# Patient Record
Sex: Male | Born: 1960 | Race: Black or African American | Hispanic: No | Marital: Single | Smoking: Former smoker
Health system: Southern US, Community
[De-identification: ages and names within clinical notes are randomized; demographics above are authoritative.]

## PROBLEM LIST (undated history)

## (undated) DIAGNOSIS — E559 Vitamin D deficiency, unspecified: Secondary | ICD-10-CM

## (undated) DIAGNOSIS — E611 Iron deficiency: Secondary | ICD-10-CM

## (undated) DIAGNOSIS — C801 Malignant (primary) neoplasm, unspecified: Secondary | ICD-10-CM

## (undated) DIAGNOSIS — K219 Gastro-esophageal reflux disease without esophagitis: Secondary | ICD-10-CM

## (undated) HISTORY — PX: KNEE SURGERY: SHX244

---

## 2019-04-27 ENCOUNTER — Emergency Department (HOSPITAL_COMMUNITY): Payer: Medicaid Other

## 2019-04-27 ENCOUNTER — Encounter (HOSPITAL_COMMUNITY): Payer: Self-pay | Admitting: Emergency Medicine

## 2019-04-27 ENCOUNTER — Inpatient Hospital Stay (HOSPITAL_COMMUNITY): Payer: Medicaid Other

## 2019-04-27 ENCOUNTER — Other Ambulatory Visit: Payer: Self-pay

## 2019-04-27 ENCOUNTER — Inpatient Hospital Stay (HOSPITAL_COMMUNITY)
Admission: EM | Admit: 2019-04-27 | Discharge: 2019-05-04 | DRG: 166 | Disposition: A | Discharge status: Home Health | Payer: Medicaid Other | Attending: Internal Medicine | Admitting: Internal Medicine

## 2019-04-27 DIAGNOSIS — E559 Vitamin D deficiency, unspecified: Secondary | ICD-10-CM | POA: Diagnosis present

## 2019-04-27 DIAGNOSIS — T380X5A Adverse effect of glucocorticoids and synthetic analogues, initial encounter: Secondary | ICD-10-CM | POA: Diagnosis not present

## 2019-04-27 DIAGNOSIS — D649 Anemia, unspecified: Secondary | ICD-10-CM | POA: Diagnosis present

## 2019-04-27 DIAGNOSIS — D72829 Elevated white blood cell count, unspecified: Secondary | ICD-10-CM | POA: Diagnosis not present

## 2019-04-27 DIAGNOSIS — F1010 Alcohol abuse, uncomplicated: Secondary | ICD-10-CM | POA: Diagnosis present

## 2019-04-27 DIAGNOSIS — G936 Cerebral edema: Secondary | ICD-10-CM | POA: Diagnosis present

## 2019-04-27 DIAGNOSIS — C3411 Malignant neoplasm of upper lobe, right bronchus or lung: Secondary | ICD-10-CM | POA: Diagnosis not present

## 2019-04-27 DIAGNOSIS — R739 Hyperglycemia, unspecified: Secondary | ICD-10-CM | POA: Diagnosis not present

## 2019-04-27 DIAGNOSIS — F17210 Nicotine dependence, cigarettes, uncomplicated: Secondary | ICD-10-CM | POA: Diagnosis present

## 2019-04-27 DIAGNOSIS — E876 Hypokalemia: Secondary | ICD-10-CM

## 2019-04-27 DIAGNOSIS — Z20828 Contact with and (suspected) exposure to other viral communicable diseases: Secondary | ICD-10-CM | POA: Diagnosis present

## 2019-04-27 DIAGNOSIS — R29701 NIHSS score 1: Secondary | ICD-10-CM | POA: Diagnosis present

## 2019-04-27 DIAGNOSIS — R569 Unspecified convulsions: Secondary | ICD-10-CM | POA: Diagnosis present

## 2019-04-27 DIAGNOSIS — E611 Iron deficiency: Secondary | ICD-10-CM | POA: Diagnosis present

## 2019-04-27 DIAGNOSIS — C7931 Secondary malignant neoplasm of brain: Secondary | ICD-10-CM | POA: Diagnosis present

## 2019-04-27 DIAGNOSIS — Z791 Long term (current) use of non-steroidal anti-inflammatories (NSAID): Secondary | ICD-10-CM | POA: Diagnosis not present

## 2019-04-27 DIAGNOSIS — Z9889 Other specified postprocedural states: Secondary | ICD-10-CM

## 2019-04-27 DIAGNOSIS — I10 Essential (primary) hypertension: Secondary | ICD-10-CM | POA: Diagnosis present

## 2019-04-27 DIAGNOSIS — R531 Weakness: Secondary | ICD-10-CM

## 2019-04-27 DIAGNOSIS — C7951 Secondary malignant neoplasm of bone: Secondary | ICD-10-CM | POA: Diagnosis present

## 2019-04-27 HISTORY — DX: Vitamin D deficiency, unspecified: E55.9

## 2019-04-27 HISTORY — DX: Iron deficiency: E61.1

## 2019-04-27 LAB — URINALYSIS, ROUTINE W REFLEX MICROSCOPIC
Bilirubin Urine: NEGATIVE
Glucose, UA: NEGATIVE mg/dL
Hgb urine dipstick: NEGATIVE
Ketones, ur: NEGATIVE mg/dL
Leukocytes,Ua: NEGATIVE
Nitrite: NEGATIVE
Protein, ur: NEGATIVE mg/dL
Specific Gravity, Urine: 1.005 (ref 1.005–1.030)
pH: 6 (ref 5.0–8.0)

## 2019-04-27 LAB — DIFFERENTIAL
Abs Immature Granulocytes: 0.02 10*3/uL (ref 0.00–0.07)
Basophils Absolute: 0 10*3/uL (ref 0.0–0.1)
Basophils Relative: 0 %
Eosinophils Absolute: 0.3 10*3/uL (ref 0.0–0.5)
Eosinophils Relative: 3 %
Immature Granulocytes: 0 %
Lymphocytes Relative: 31 %
Lymphs Abs: 3 10*3/uL (ref 0.7–4.0)
Monocytes Absolute: 0.8 10*3/uL (ref 0.1–1.0)
Monocytes Relative: 8 %
Neutro Abs: 5.7 10*3/uL (ref 1.7–7.7)
Neutrophils Relative %: 58 %

## 2019-04-27 LAB — RAPID URINE DRUG SCREEN, HOSP PERFORMED
Amphetamines: NOT DETECTED
Barbiturates: NOT DETECTED
Benzodiazepines: NOT DETECTED
Cocaine: NOT DETECTED
Opiates: NOT DETECTED
Tetrahydrocannabinol: NOT DETECTED

## 2019-04-27 LAB — COMPREHENSIVE METABOLIC PANEL
ALT: 14 U/L (ref 0–44)
AST: 20 U/L (ref 15–41)
Albumin: 3.2 g/dL — ABNORMAL LOW (ref 3.5–5.0)
Alkaline Phosphatase: 100 U/L (ref 38–126)
Anion gap: 9 (ref 5–15)
BUN: 14 mg/dL (ref 6–20)
CO2: 24 mmol/L (ref 22–32)
Calcium: 9.4 mg/dL (ref 8.9–10.3)
Chloride: 102 mmol/L (ref 98–111)
Creatinine, Ser: 0.77 mg/dL (ref 0.61–1.24)
GFR calc Af Amer: >60 mL/min (ref >60)
GFR calc non Af Amer: >60 mL/min (ref >60)
Glucose, Bld: 153 mg/dL — ABNORMAL HIGH (ref 70–99)
Potassium: 3.4 mmol/L — ABNORMAL LOW (ref 3.5–5.1)
Sodium: 135 mmol/L (ref 135–145)
Total Bilirubin: 0.6 mg/dL (ref 0.3–1.2)
Total Protein: 8.4 g/dL — ABNORMAL HIGH (ref 6.5–8.1)

## 2019-04-27 LAB — CBC
HCT: 35.6 % — ABNORMAL LOW (ref 39.0–52.0)
Hemoglobin: 11.3 g/dL — ABNORMAL LOW (ref 13.0–17.0)
MCH: 29.3 pg (ref 26.0–34.0)
MCHC: 31.7 g/dL (ref 30.0–36.0)
MCV: 92.2 fL (ref 80.0–100.0)
Platelets: 428 10*3/uL — ABNORMAL HIGH (ref 150–400)
RBC: 3.86 MIL/uL — ABNORMAL LOW (ref 4.22–5.81)
RDW: 12.7 % (ref 11.5–15.5)
WBC: 9.9 10*3/uL (ref 4.0–10.5)
nRBC: 0 % (ref 0.0–0.2)

## 2019-04-27 LAB — PROTIME-INR
INR: 1.2 (ref 0.8–1.2)
Prothrombin Time: 15.5 seconds — ABNORMAL HIGH (ref 11.4–15.2)

## 2019-04-27 LAB — APTT: aPTT: 34 seconds (ref 24–36)

## 2019-04-27 LAB — ETHANOL: Alcohol, Ethyl (B): <10 mg/dL (ref <10)

## 2019-04-27 MED ORDER — SODIUM CHLORIDE 0.9% FLUSH
3.0000 mL | Freq: Two times a day (BID) | INTRAVENOUS | Status: DC
  Administered 2019-04-28 – 2019-05-04 (×11): 3 mL via INTRAVENOUS

## 2019-04-27 MED ORDER — IOHEXOL 300 MG/ML  SOLN: 100.0000 mL | Freq: Once | INTRAMUSCULAR | Status: DC | PRN

## 2019-04-27 MED ORDER — ACETAMINOPHEN 650 MG RE SUPP: 650.0000 mg | Freq: Four times a day (QID) | RECTAL | Status: DC | PRN

## 2019-04-27 MED ORDER — SODIUM CHLORIDE 0.9% FLUSH
3.0000 mL | INTRAVENOUS | Status: DC | PRN
  Administered 2019-04-27: 3 mL via INTRAVENOUS
  Filled 2019-04-27: qty 3

## 2019-04-27 MED ORDER — SODIUM CHLORIDE 0.9 % IV SOLN
250.0000 mL | INTRAVENOUS | Status: DC | PRN
  Administered 2019-04-28 – 2019-04-29 (×3): 250 mL via INTRAVENOUS

## 2019-04-27 MED ORDER — DEXAMETHASONE SODIUM PHOSPHATE 10 MG/ML IJ SOLN
10.0000 mg | Freq: Once | INTRAMUSCULAR | Status: AC
  Administered 2019-04-27: 21:00:00 10 mg via INTRAVENOUS
  Filled 2019-04-27: qty 1

## 2019-04-27 MED ORDER — ACETAMINOPHEN 325 MG PO TABS
650.0000 mg | ORAL_TABLET | Freq: Four times a day (QID) | ORAL | Status: DC | PRN
  Administered 2019-04-30 – 2019-05-03 (×5): 650 mg via ORAL
  Filled 2019-04-27 (×5): qty 2

## 2019-04-27 MED ORDER — LEVETIRACETAM IN NACL 1000 MG/100ML IV SOLN
1000.0000 mg | Freq: Once | INTRAVENOUS | Status: AC
  Administered 2019-04-27: 21:00:00 1000 mg via INTRAVENOUS
  Filled 2019-04-27: qty 100

## 2019-04-27 NOTE — ED Notes (Signed)
PT back to room at this time from CT.

## 2019-04-27 NOTE — ED Notes (Signed)
Code stroke beeped out at [place]

## 2019-04-27 NOTE — H&P (Signed)
TRH H&P    Patient Demographics:    Brandon Ferguson, is a 58 y.o. male  MRN: [id number]  DOB - Mar 23, 1961  Admit Date - 04/27/2019  Referring MD/NP/PA: Ezequiel Essex  Outpatient Primary MD for the patient is Patient, No Pcp Per  Patient coming from:  home  Chief complaint- right sided weakness   HPI:    Brandon Ferguson  is a 58 y.o. male, w hx of iron deficiency?, vitamin D def, smoker, apparently presents with right sided weakness that started about 1800 while loading some wood. Pt states that about 1 week ago had left arm weakness.  Pt notes headache for the past week.  Pt denies vision change, numbness, tingling, change in bowel movement, brbpr, hematuria, incontinence.  Pt notes mid to lower back pain over the past few weeks as well as slight weight  loss.     In ED,  T 99.5, P 95  R 22, Bp 145/90  Pox 96% on RA  CT brain  IMPRESSION: 1. 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 Brain MRI without and with contrast is recommended for further evaluation. 2. No midline shift or intracranial hemorrhage.  Urinalysis negative ETOH <10 INR 1.2 Wbc 9.9, Hgb 11.3, Plt 428 Na 135, K 3.4,  Bun 14, Creatinine 0.77,  Ast 20, Alt 14 Alb 3.2  Teleneurology consulted recommended neurosurgery consult  Neurosurgery consulted by ED, recommended MRI brain, and dexamethasone and keppra per ED.    Pt will be admitted to [facility] for metastatic brain disease of unknown primary    Review of systems:    In addition to the HPI above,  No Fever-chills, No Headache, No changes with Vision or hearing, No problems swallowing food or Liquids, No Chest pain, Cough or Shortness of Breath, No Abdominal pain, No Nausea or Vomiting, bowel movements are regular, No Blood in stool or Urine, No dysuria, No new skin rashes or bruises, No new joints pains-aches,  No  new weakness, tingling, numbness in any extremity,   No polyuria, polydypsia or polyphagia, No significant Mental Stressors.  All other systems reviewed and are negative.    Past History of the following :    Past Medical History:  Diagnosis Date   Iron deficiency    Vitamin D deficiency       Past Surgical History:  Procedure Laterality Date   KNEE SURGERY        Social History:      Social History   Tobacco Use   Smoking status: Current Every Day Smoker    Packs/day: 1.00    Types: Cigarettes   Smokeless tobacco: Never Used  Substance Use Topics   Alcohol use: Yes    Alcohol/week: 6.0 standard drinks    Types: 6 Cans of beer per week    Comment: pt drinks 3 large beers daily       Family History :     Family History  Problem Relation Age of Onset   Breast cancer Mother  Cirrhosis Father    Alcohol abuse Father        Home Medications:   Prior to Admission medications   Medication Sig Start Date End Date Taking? Authorizing Provider  meloxicam (MOBIC) 15 MG tablet Take 15 mg by mouth daily. 04/25/19   [provider]     Allergies:    No Known Allergies   Physical Exam:   Vitals  Blood pressure (!) 142/84, pulse 69, temperature 99.5 F (37.5 C), temperature source Oral, resp. rate 19, height 6' (1.829 m), weight 74.8 kg, SpO2 95 %.  1.  General: axoxo3  2. Psychiatric: euthymic  3. Neurologic: Cn 2-12 intact, reflexes 2+ symmetric, diffuse with no clonus, motor 5/5 in all 4 ext  4. HEENMT:  Anicteric, + arcus senilis, pupils 1.21mm symmetric, direct, consensual,  Near intact Neck: no jvd  5. Respiratory : CTAB  6. Cardiovascular : rrr s1, s2, no m/g/r  7. Gastrointestinal:  Abd: soft, nt, nd, +bs  8. Skin:  Ext: no c/c/e, no rash  9.Musculoskeletal:  Good ROM,  No adenopathy    Data Review:    CBC Recent Labs  Lab 04/27/19 1835  WBC 9.9  HGB 11.3*  HCT 35.6*  PLT 428*  MCV 92.2  MCH 29.3   MCHC 31.7  RDW 12.7  LYMPHSABS 3.0  MONOABS 0.8  EOSABS 0.3  BASOSABS 0.0   ------------------------------------------------------------------------------------------------------------------  Results for orders placed or performed during the hospital encounter of 04/27/19 (from the past 48 hour(s))  Urine rapid drug screen (hosp performed)     Status: None   Collection Time: 04/27/19  6:25 PM  Result Value Ref Range   Opiates NONE DETECTED NONE DETECTED   Cocaine NONE DETECTED NONE DETECTED   Benzodiazepines NONE DETECTED NONE DETECTED   Amphetamines NONE DETECTED NONE DETECTED   Tetrahydrocannabinol NONE DETECTED NONE DETECTED   Barbiturates NONE DETECTED NONE DETECTED    Comment: (NOTE) DRUG SCREEN FOR MEDICAL PURPOSES ONLY.  IF CONFIRMATION IS NEEDED FOR ANY PURPOSE, NOTIFY LAB WITHIN 5 DAYS. LOWEST DETECTABLE LIMITS FOR URINE DRUG SCREEN Drug Class                     Cutoff (ng/mL) Amphetamine and metabolites    1000 Barbiturate and metabolites    200 Benzodiazepine                 277 Tricyclics and metabolites     300 Opiates and metabolites        300 Cocaine and metabolites        300 THC                            50 Performed at [facility], [address] [place], [place] [address]   Urinalysis, Routine w reflex microscopic     Status: Abnormal   Collection Time: 04/27/19  6:25 PM  Result Value Ref Range   Color, Urine STRAW (A) YELLOW   APPearance CLEAR CLEAR   Specific Gravity, Urine 1.005 1.005 - 1.030   pH 6.0 5.0 - 8.0   Glucose, UA NEGATIVE NEGATIVE mg/dL   Hgb urine dipstick NEGATIVE NEGATIVE   Bilirubin Urine NEGATIVE NEGATIVE   Ketones, ur NEGATIVE NEGATIVE mg/dL   Protein, ur NEGATIVE NEGATIVE mg/dL   Nitrite NEGATIVE NEGATIVE   Leukocytes,Ua NEGATIVE NEGATIVE    Comment: Performed at [facility], [address] [place],  [address]  Ethanol  Status: None   Collection Time: 04/27/19  6:35 PM  Result Value Ref Range   Alcohol,  Ethyl (B) <10 <10 mg/dL    Comment: (NOTE) Lowest detectable limit for serum alcohol is 10 mg/dL. For medical purposes only. Performed at [facility], [address], [place], [place] [address]   Protime-INR     Status: Abnormal   Collection Time: 04/27/19  6:35 PM  Result Value Ref Range   Prothrombin Time 15.5 (H) 11.4 - 15.2 seconds   INR 1.2 0.8 - 1.2    Comment: (NOTE) INR goal varies based on device and disease states. Performed at [facility], [address] [place], [place] [address]   APTT     Status: None   Collection Time: 04/27/19  6:35 PM  Result Value Ref Range   aPTT 34 24 - 36 seconds    Comment: Performed at [facility], [address], [place], [place] [address]  CBC     Status: Abnormal   Collection Time: 04/27/19  6:35 PM  Result Value Ref Range   WBC 9.9 4.0 - 10.5 K/uL   RBC 3.86 (L) 4.22 - 5.81 MIL/uL   Hemoglobin 11.3 (L) 13.0 - 17.0 g/dL   HCT 35.6 (L) 39.0 - 52.0 %   MCV 92.2 80.0 - 100.0 fL   MCH 29.3 26.0 - 34.0 pg   MCHC 31.7 30.0 - 36.0 g/dL   RDW 12.7 11.5 - 15.5 %   Platelets 428 (H) 150 - 400 K/uL   nRBC 0.0 0.0 - 0.2 %    Comment: Performed at [facility], [address], [place], [place] [address]  Differential     Status: None   Collection Time: 04/27/19  6:35 PM  Result Value Ref Range   Neutrophils Relative % 58 %   Neutro Abs 5.7 1.7 - 7.7 K/uL   Lymphocytes Relative 31 %   Lymphs Abs 3.0 0.7 - 4.0 K/uL   Monocytes Relative 8 %   Monocytes Absolute 0.8 0.1 - 1.0 K/uL   Eosinophils Relative 3 %   Eosinophils Absolute 0.3 0.0 - 0.5 K/uL   Basophils Relative 0 %   Basophils Absolute 0.0 0.0 - 0.1 K/uL   Immature Granulocytes 0 %   Abs Immature Granulocytes 0.02 0.00 - 0.07 K/uL    Comment: Performed at [facility], [address], [place], [place] [address]  Comprehensive metabolic panel     Status: Abnormal   Collection Time: 04/27/19  6:35 PM  Result Value Ref Range   Sodium 135 135 - 145 mmol/L   Potassium 3.4 (L) 3.5 - 5.1  mmol/L   Chloride 102 98 - 111 mmol/L   CO2 24 22 - 32 mmol/L   Glucose, Bld 153 (H) 70 - 99 mg/dL   BUN 14 6 - 20 mg/dL   Creatinine, Ser 0.77 0.61 - 1.24 mg/dL   Calcium 9.4 8.9 - 10.3 mg/dL   Total Protein 8.4 (H) 6.5 - 8.1 g/dL   Albumin 3.2 (L) 3.5 - 5.0 g/dL   AST 20 15 - 41 U/L   ALT 14 0 - 44 U/L   Alkaline Phosphatase 100 38 - 126 U/L   Total Bilirubin 0.6 0.3 - 1.2 mg/dL   GFR calc non Af Amer >60 >60 mL/min   GFR calc Af Amer >60 >60 mL/min   Anion gap 9 5 - 15    Comment: Performed at [facility], [address], [place],  [address]  Chemistries  Recent Labs  Lab 04/27/19 1835  NA 135  K 3.4*  CL 102  CO2 24  GLUCOSE 153*  BUN 14  CREATININE 0.77  CALCIUM 9.4  AST 20  ALT 14  ALKPHOS 100  BILITOT 0.6   ------------------------------------------------------------------------------------------------------------------  ------------------------------------------------------------------------------------------------------------------ GFR: Estimated Creatinine Clearance: 105.2 mL/min (by C-G formula based on SCr of 0.77 mg/dL). Liver Function Tests: Recent Labs  Lab 04/27/19 1835  AST 20  ALT 14  ALKPHOS 100  BILITOT 0.6  PROT 8.4*  ALBUMIN 3.2*   No results for input(s): LIPASE, AMYLASE in the last 168 hours. No results for input(s): AMMONIA in the last 168 hours. Coagulation Profile: Recent Labs  Lab 04/27/19 1835  INR 1.2   Cardiac Enzymes: No results for input(s): CKTOTAL, CKMB, CKMBINDEX, TROPONINI in the last 168 hours. BNP (last 3 results) No results for input(s): PROBNP in the last 8760 hours. HbA1C: No results for input(s): HGBA1C in the last 72 hours. CBG: No results for input(s): GLUCAP in the last 168 hours. Lipid Profile: No results for input(s): CHOL, HDL, LDLCALC, TRIG, CHOLHDL, LDLDIRECT in the last 72 hours. Thyroid Function Tests: No results for input(s): TSH, T4TOTAL, FREET4, T3FREE, THYROIDAB in the last 72  hours. Anemia Panel: No results for input(s): VITAMINB12, FOLATE, FERRITIN, TIBC, IRON, RETICCTPCT in the last 72 hours.  --------------------------------------------------------------------------------------------------------------- Urine analysis:    Component Value Date/Time   COLORURINE STRAW (A) 04/27/2019 1825   APPEARANCEUR CLEAR 04/27/2019 1825   LABSPEC 1.005 04/27/2019 1825   PHURINE 6.0 04/27/2019 1825   GLUCOSEU NEGATIVE 04/27/2019 1825   HGBUR NEGATIVE 04/27/2019 1825   BILIRUBINUR NEGATIVE 04/27/2019 1825   KETONESUR NEGATIVE 04/27/2019 1825   PROTEINUR NEGATIVE 04/27/2019 1825   NITRITE NEGATIVE 04/27/2019 1825   LEUKOCYTESUR NEGATIVE 04/27/2019 1825      Imaging Results:    Ct Head Code Stroke Wo Contrast  Result Date: 04/27/2019 CLINICAL DATA:  Code stroke.  Right-sided weakness. EXAM: CT HEAD WITHOUT CONTRAST TECHNIQUE: Contiguous axial images were obtained from the base of the skull through the vertex without intravenous contrast. COMPARISON:  None. FINDINGS: Brain: Rounded low-density masses posteriorly in both frontal lobes near the vertex each measure 1.7 cm, and there is a 2.5 cm low-density mass more anteriorly in the left frontal lobe. There is extensive vasogenic edema in the left greater than right frontal lobes with regional sulcal effacement and slight mass effect on the lateral ventricles without midline shift. A small area of edema is noted in the right occipital lobe without a presumed underlying lesion visible on this unenhanced study. No acute cortically based infarct, intracranial hemorrhage, or extra-axial fluid collection is identified. Vascular: No hyperdense vessel. Skull: No fracture or focal osseous lesion. Sinuses/Orbits: Visualized paranasal sinuses and mastoid air cells are clear. Orbits are unremarkable. Other: None. ASPECTS [facility] Stroke Program Early CT Score) Not scored due to the presence of multiple cerebral masses. IMPRESSION: 1.  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 Brain MRI without and with contrast is recommended for further evaluation. 2. No midline shift or intracranial hemorrhage. These results were called by telephone at the time of interpretation on 04/27/2019 at 6:45 pm to Dr. Ezequiel Essex, who verbally acknowledged these results. Electronically Signed   By: Logan Bores M.D.   On: 04/27/2019 18:47   nsr at 97, nl axis, nl int, no st-t change c/w ischemia.    Assessment & Plan:    Principal Problem:  Metastatic cancer to brain [facility]) Active Problems:   Hypokalemia  Metastatic cancer to brain ? Check MRI brain w and without contrast Check MRI C spine CT chest/ abd/ pelvis Dexamethasone 4mg  iv q4h  Keppra 500mg  iv bid Please consult neurosurgery in AM  Hypokalemia Replete Check cmp in am  DVT Prophylaxis-   SCDs   AM Labs Ordered, also please review Full Orders  Family Communication: Admission, patients condition and plan of care including tests being ordered have been discussed with the patient  who indicate understanding and agree with the plan and Code Status.  Code Status:  FULL CODE per patient , family present in ED w patient  Admission status: Inpatient: Based on patients clinical presentation and evaluation of above clinical data, I have made determination that patient meets Inpatient criteria at this time.   Pt has high risk of clinical deterioration.  Pt will require w/up for metastatic brain tumor, and will require iv dexamethasone and keppra initially and then transition to oral agent. Pt will require > 2nites stay to ensure stable.   Time spent in minutes : 55 minutes    Jani Gravel M.D on 04/27/2019 at 11:24 PM

## 2019-04-27 NOTE — ED Triage Notes (Signed)
PT brought in by [facility] for right sided weakness that started about 1800 today while loading wood in his woodstove. EMS noticied RUE weakness upon arrival but while in route has become better. Dr. Wyvonnia Dusky met EMS at the door 1819 and evaluated the patient. PT taken to CT by EMS and nursing at [place].

## 2019-04-27 NOTE — Progress Notes (Signed)
Patient ID: Brandon Ferguson, male   DOB: April 25, 1961, 58 y.o.   MRN: [id number] CT shows what looks  like multiple brain mets.  Patient will need Medical admission with full metastatic workup including brain MRI with and without contrast, seizure prophylaxis with Keppra, Decadron 10q6.  Patient will also need at least a stepdown bed or ICU for first night.  If patient is able to get to [facility], will see in am unless primary service needs [place] for any reason earlier.

## 2019-04-27 NOTE — Progress Notes (Signed)
Code stroke time documentation CALL TIME 1805 BEEPER TIME 1811 EXAM STARTED 1818 EXAM FINISHED 1820 IMAGES SENT TO [facility] Epic [phone] [place]

## 2019-04-27 NOTE — ED Provider Notes (Signed)
[facility] EMERGENCY DEPARTMENT Provider Note   CSN: [id number] Arrival date & time: 04/27/19  1823     History   Chief Complaint No chief complaint on file.   HPI Tevyn Codd is a 58 y.o. male.     Patient presents by EMS as code stroke.  He developed right-sided weakness to his face and arm approximately 30 minutes ago while putting wood into a wood stove.  EMS reports initially had right-sided weakness and right pronator drift and facial droop which are improving.  Patient feels he is still having some weakness on the right arm.  Notably his family stated he had a similar episode a week ago with weakness on the left side which resolved on its own.  Has had difficulty walking and intermittent dizziness since by report.  He was never evaluated for the episode on the left side.  He denies any headache, chest pain, shortness of breath, abdominal pain, nausea, vomiting. Patient has a history of hypertension.  Denies any other medical history.  The history is provided by the patient and the EMS personnel.    No past medical history on file.  There are no active problems to display for this patient.   Past Surgical History:  Procedure Laterality Date  . KNEE SURGERY          Home Medications    Prior to Admission medications   Not on File    Family History No family history on file.  Social History Social History   Tobacco Use  . Smoking status: Not on file  Substance Use Topics  . Alcohol use: Not on file  . Drug use: Not on file     Allergies   Patient has no allergy information on record.   Review of Systems Review of Systems  Constitutional: Negative for activity change, appetite change and fever.  HENT: Negative for congestion and rhinorrhea.   Eyes: Negative for visual disturbance.  Respiratory: Negative for chest tightness and shortness of breath.   Cardiovascular: Negative for chest pain.  Gastrointestinal: Negative for abdominal pain,  nausea and vomiting.  Musculoskeletal: Negative for arthralgias and myalgias.  Skin: Negative for rash.  Neurological: Positive for facial asymmetry, weakness and numbness. Negative for headaches.   all other systems are negative except as noted in the HPI and PMH.     Physical Exam Updated Vital Signs BP (!) 145/90 (BP Location: Left Arm)   Pulse 95   Temp 99.5 F (37.5 C) (Oral)   Resp (!) 23   Ht 6' (1.829 m)   Wt 74.8 kg   SpO2 97%   BMI 22.38 kg/m   Physical Exam Vitals signs and nursing note reviewed.  Constitutional:      General: He is not in acute distress.    Appearance: He is well-developed.  HENT:     Head: Normocephalic and atraumatic.     Mouth/Throat:     Pharynx: No oropharyngeal exudate.  Eyes:     Conjunctiva/sclera: Conjunctivae normal.     Pupils: Pupils are equal, round, and reactive to light.  Neck:     Musculoskeletal: Normal range of motion and neck supple.     Comments: No meningismus. Cardiovascular:     Rate and Rhythm: Normal rate and regular rhythm.     Heart sounds: Normal heart sounds. No murmur.  Pulmonary:     Effort: Pulmonary effort is normal. No respiratory distress.     Breath sounds: Normal breath sounds.  Abdominal:  Palpations: Abdomen is soft.     Tenderness: There is no abdominal tenderness. There is no guarding or rebound.  Musculoskeletal: Normal range of motion.        General: No tenderness.  Skin:    General: Skin is warm.  Neurological:     Mental Status: He is alert and oriented to person, place, and time.     Cranial Nerves: No cranial nerve deficit.     Motor: No abnormal muscle tone.     Coordination: Coordination normal.     Comments: No appreciable facial droop, cranial nerves II to XII intact, tongue is midline,.  Equal eyebrow raise. No pronator drift evident of right upper extremity.  Mildly decreased grip strength on the right, 5/5 strength of flexion and extension of forearm bilaterally. 5/5 strength  of lower extremities, able to hold off the bed without difficulty.  Psychiatric:        Behavior: Behavior normal.      ED Treatments / Results  Labs (all labs ordered are listed, but only abnormal results are displayed) Labs Reviewed  PROTIME-INR - Abnormal; Notable for the following components:      Result Value   Prothrombin Time 15.5 (*)    All other components within normal limits  CBC - Abnormal; Notable for the following components:   RBC 3.86 (*)    Hemoglobin 11.3 (*)    HCT 35.6 (*)    Platelets 428 (*)    All other components within normal limits  COMPREHENSIVE METABOLIC PANEL - Abnormal; Notable for the following components:   Potassium 3.4 (*)    Glucose, Bld 153 (*)    Total Protein 8.4 (*)    Albumin 3.2 (*)    All other components within normal limits  URINALYSIS, ROUTINE W REFLEX MICROSCOPIC - Abnormal; Notable for the following components:   Color, Urine STRAW (*)    All other components within normal limits  SARS CORONAVIRUS 2 (TAT 6-24 HRS)  ETHANOL  APTT  DIFFERENTIAL  RAPID URINE DRUG SCREEN, HOSP PERFORMED  HIV ANTIBODY (ROUTINE TESTING W REFLEX)  COMPREHENSIVE METABOLIC PANEL  CBC  I-STAT CHEM 8, ED    EKG EKG Interpretation  Date/Time:  Wednesday April 27 2019 18:39:03 EST Ventricular Rate:  97 PR Interval:    QRS Duration: 102 QT Interval:  366 QTC Calculation: 465 R Axis:   50 Text Interpretation: Sinus rhythm No previous ECGs available Confirmed by Ezequiel Essex [id number]) on 04/27/2019 7:13:53 PM   Radiology Ct Head Code Stroke Wo Contrast  Result Date: 04/27/2019 CLINICAL DATA:  Code stroke.  Right-sided weakness. EXAM: CT HEAD WITHOUT CONTRAST TECHNIQUE: Contiguous axial images were obtained from the base of the skull through the vertex without intravenous contrast. COMPARISON:  None. FINDINGS: Brain: Rounded low-density masses posteriorly in both frontal lobes near the vertex each measure 1.7 cm, and there is a 2.5 cm  low-density mass more anteriorly in the left frontal lobe. There is extensive vasogenic edema in the left greater than right frontal lobes with regional sulcal effacement and slight mass effect on the lateral ventricles without midline shift. A small area of edema is noted in the right occipital lobe without a presumed underlying lesion visible on this unenhanced study. No acute cortically based infarct, intracranial hemorrhage, or extra-axial fluid collection is identified. Vascular: No hyperdense vessel. Skull: No fracture or focal osseous lesion. Sinuses/Orbits: Visualized paranasal sinuses and mastoid air cells are clear. Orbits are unremarkable. Other: None. ASPECTS [facility] Stroke Program Early CT  Score) Not scored due to the presence of multiple cerebral masses. IMPRESSION: 1. 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 Brain MRI without and with contrast is recommended for further evaluation. 2. No midline shift or intracranial hemorrhage. These results were called by telephone at the time of interpretation on 04/27/2019 at 6:45 pm to Dr. Ezequiel Essex, who verbally acknowledged these results. Electronically Signed   By: Logan Bores M.D.   On: 04/27/2019 18:47    Procedures .Critical Care Performed by: Ezequiel Essex, MD Authorized by: Ezequiel Essex, MD   Critical care provider statement:    Critical care time (minutes):  45   Critical care was necessary to treat or prevent imminent or life-threatening deterioration of the following conditions:  CNS failure or compromise   Critical care was time spent personally by me on the following activities:  Discussions with consultants, evaluation of patient's response to treatment, examination of patient, ordering and performing treatments and interventions, ordering and review of laboratory studies, ordering and review of radiographic studies, pulse oximetry, re-evaluation of patient's  condition, obtaining history from patient or surrogate and review of old charts   (including critical care time)  Medications Ordered in ED Medications - No data to display   Initial Impression / Assessment and Plan / ED Course  I have reviewed the triage vital signs and the nursing notes.  Pertinent labs & imaging results that were available during my care of the patient were reviewed by me and considered in my medical decision making (see chart for details).       Patient from home with right-sided weakness onset 30 minutes ago that appears to be improving.  Code stroke activated on arrival.  CT shows no hemorrhage but does show several areas of masses concerning for brain metastasis.  Discussed with Dr. Ramon Dredge of neurology.  Patient not a TPA candidate due to delay in presentation as well as improving symptoms and presence of brain masses.  Patient will need admission for neurosurgery evaluation and MRI.  Discussed with neurosurgery team Dr. Saintclair Halsted.  He agrees with IV Decadron and MRI and medicine admission.  Discussed with neurology and neurosurgery.  Patient to receive IV Decadron 10 mg every 6h.  Will also give seizure prophylaxis of 1 g of Keppra.  Will need admission to [facility] for neurosurgery evaluation of further metastatic work-up.  Discussed with Dr. Maudie Mercury.  Auburn Hert was evaluated in Emergency Department on 04/27/2019 for the symptoms described in the history of present illness. He was evaluated in the context of the global COVID-19 pandemic, which necessitated consideration that the patient might be at risk for infection with the SARS-CoV-2 virus that causes COVID-19. Institutional protocols and algorithms that pertain to the evaluation of patients at risk for COVID-19 are in a state of rapid change based on information released by regulatory bodies including the CDC and federal and state organizations. These policies and algorithms were followed during the  patient's care in the ED.  Final Clinical Impressions(s) / ED Diagnoses   Final diagnoses:  Brain metastases [facility])  Right sided weakness    ED Discharge Orders    None       Shraga Custard, Annie Main, MD 04/27/19 2051

## 2019-04-27 NOTE — ED Notes (Signed)
Decision for no TPA at this time made. Neurologist calling Dr. Wyvonnia Dusky to suggest MRI of brain.

## 2019-04-27 NOTE — ED Notes (Signed)
Blood to lab at [employer] [employer]

## 2019-04-27 NOTE — Consult Note (Addendum)
[facility] TeleNeurology Consult Services   Date of Service:   04/27/2019 18:30:07  Impression:     .  Metastatic disease  Comments/Sign-Out: 58 year old male who presents to the hospital because of right side weakness. Presentation and imaging is consistent with metastatic disease.  Metrics: Last Known Well: 04/27/2019 18:00:00 [facility] Notification Time: 04/27/2019 18:30:07 Arrival Time: 04/27/2019 18:24:00 Stamp Time: 04/27/2019 18:30:07 Time First Login Attempt: 04/27/2019 18:36:00 Video Start Time: 04/27/2019 18:36:00  Symptoms: Right side weakness NIHSS Start Assessment Time: 04/27/2019 18:39:00 Patient is not a candidate for Alteplase/Activase. Patient was not deemed candidate for Alteplase/Activase thrombolytics because of Intracranial Intra-Axial Neoplasm. Video End Time: 04/27/2019 18:46:51  CT head was reviewed and results were: multiple intracranial mets  Clinical Presentation is not Suggestive of Large Vessel Occlusive Disease  Radiologist was not called back for review of advanced imaging because [facility] ED Physician notified of diagnostic impression and management plan on 04/27/2019 18:51:10  Our recommendations are outlined below.  Recommendations:     .  Neuro Checks     .  Bedside Swallow Eval     .  DVT Prophylaxis     .  Euglycemia and Avoid Hyperthermia (PRN Acetaminophen)     .  Neurosurgery consult  Routine Consultation with [facility] for Follow up Care  Sign Out:     .  Discussed with Emergency Department Provider    ------------------------------------------------------------------------------  History of Present Illness: Patient is a 58 year old Male.  Patient was brought by EMS for symptoms of Right side weakness  58 year old male who presents to the hospital because of sudden onset of right side weakness. Patient was loading wood and he had sudden onset of right side weakness. Patient reports a  similar episode last week while working but states he had left side weakness at that time and that it resolved. He also reports back pain which has been ongoing for the past 3 weeks.     Examination: BP(145/90), Pulse(98), Blood Glucose(189) 1A: Level of Consciousness - Alert; keenly responsive + 0 1B: Ask Month and Age - Both Questions Right + 0 1C: Blink Eyes & Squeeze Hands - Performs Both Tasks + 0 2: Test Horizontal Extraocular Movements - Normal + 0 3: Test Visual Fields - No Visual Loss + 0 4: Test Facial Palsy (Use Grimace if Obtunded) - Normal symmetry + 0 5A: Test Left Arm Motor Drift - No Drift for 10 Seconds + 0 5B: Test Right Arm Motor Drift - Drift, but doesn't hit bed + 1 6A: Test Left Leg Motor Drift - No Drift for 5 Seconds + 0 6B: Test Right Leg Motor Drift - No Drift for 5 Seconds + 0 7: Test Limb Ataxia (FNF/Heel-Shin) - No Ataxia + 0 8: Test Sensation - Normal; No sensory loss + 0 9: Test Language/Aphasia - Normal; No aphasia + 0 10: Test Dysarthria - Normal + 0 11: Test Extinction/Inattention - No abnormality + 0  NIHSS Score: 1  Pre-Morbid Modified Ranking Scale: 0 Points = No symptoms at all   Patient/Family was informed the Neurology Consult would happen via TeleHealth consult by way of interactive audio and video telecommunications and consented to receiving care in this manner.   Due to the immediate potential for life-threatening deterioration due to underlying acute neurologic illness, I spent 20 minutes providing critical care. This time includes time for face to face visit via telemedicine, review of medical records, imaging studies and discussion of findings with  providers, the patient and/or family.   Dr Tsosie Billing   [facility] [phone]   Case [id number]

## 2019-04-27 NOTE — ED Notes (Signed)
CBG with EMS 189

## 2019-04-28 ENCOUNTER — Inpatient Hospital Stay (HOSPITAL_COMMUNITY): Payer: Medicaid Other

## 2019-04-28 ENCOUNTER — Other Ambulatory Visit: Payer: Self-pay | Admitting: Radiation Oncology

## 2019-04-28 ENCOUNTER — Ambulatory Visit
Admission: RE | Admit: 2019-04-28 | Discharge: 2019-04-28 | Disposition: A | Discharge status: Home / Self Care | Payer: Medicaid Other | Source: Ambulatory Visit | Attending: Radiation Oncology | Admitting: Radiation Oncology

## 2019-04-28 DIAGNOSIS — C3411 Malignant neoplasm of upper lobe, right bronchus or lung: Secondary | ICD-10-CM | POA: Insufficient documentation

## 2019-04-28 DIAGNOSIS — C7931 Secondary malignant neoplasm of brain: Secondary | ICD-10-CM | POA: Insufficient documentation

## 2019-04-28 DIAGNOSIS — I10 Essential (primary) hypertension: Secondary | ICD-10-CM | POA: Insufficient documentation

## 2019-04-28 DIAGNOSIS — R918 Other nonspecific abnormal finding of lung field: Secondary | ICD-10-CM | POA: Insufficient documentation

## 2019-04-28 DIAGNOSIS — R531 Weakness: Secondary | ICD-10-CM

## 2019-04-28 DIAGNOSIS — C7951 Secondary malignant neoplasm of bone: Secondary | ICD-10-CM | POA: Insufficient documentation

## 2019-04-28 DIAGNOSIS — R9389 Abnormal findings on diagnostic imaging of other specified body structures: Secondary | ICD-10-CM

## 2019-04-28 DIAGNOSIS — D509 Iron deficiency anemia, unspecified: Secondary | ICD-10-CM | POA: Insufficient documentation

## 2019-04-28 DIAGNOSIS — F17210 Nicotine dependence, cigarettes, uncomplicated: Secondary | ICD-10-CM | POA: Insufficient documentation

## 2019-04-28 LAB — MRSA PCR SCREENING: MRSA by PCR: NEGATIVE

## 2019-04-28 LAB — PSA: Prostatic Specific Antigen: 4.74 ng/mL — ABNORMAL HIGH (ref 0.00–4.00)

## 2019-04-28 LAB — SARS CORONAVIRUS 2 (TAT 6-24 HRS): SARS Coronavirus 2: NEGATIVE

## 2019-04-28 MED ORDER — VITAMIN B-1 100 MG PO TABS
100.0000 mg | ORAL_TABLET | Freq: Every day | ORAL | Status: DC
  Administered 2019-04-28 – 2019-05-04 (×6): 100 mg via ORAL
  Filled 2019-04-28 (×6): qty 1

## 2019-04-28 MED ORDER — LORAZEPAM 2 MG/ML IJ SOLN: 0.5000 mg | Freq: Four times a day (QID) | INTRAMUSCULAR | Status: DC | PRN

## 2019-04-28 MED ORDER — IOHEXOL 300 MG/ML  SOLN
100.0000 mL | Freq: Once | INTRAMUSCULAR | Status: AC | PRN
  Administered 2019-04-28: 100 mL via INTRAVENOUS

## 2019-04-28 MED ORDER — POLYETHYLENE GLYCOL 3350 17 G PO PACK
17.0000 g | PACK | Freq: Every day | ORAL | Status: DC
  Administered 2019-04-28 – 2019-05-04 (×5): 17 g via ORAL
  Filled 2019-04-28 (×5): qty 1

## 2019-04-28 MED ORDER — FOLIC ACID 1 MG PO TABS
1.0000 mg | ORAL_TABLET | Freq: Every day | ORAL | Status: DC
  Administered 2019-04-28 – 2019-05-04 (×6): 1 mg via ORAL
  Filled 2019-04-28 (×6): qty 1

## 2019-04-28 MED ORDER — DEXAMETHASONE SODIUM PHOSPHATE 4 MG/ML IJ SOLN
4.0000 mg | INTRAMUSCULAR | Status: DC
  Administered 2019-04-28 (×5): 4 mg via INTRAVENOUS
  Filled 2019-04-28 (×6): qty 1

## 2019-04-28 MED ORDER — LEVETIRACETAM IN NACL 500 MG/100ML IV SOLN
500.0000 mg | Freq: Two times a day (BID) | INTRAVENOUS | Status: DC
  Administered 2019-04-28 – 2019-04-30 (×4): 500 mg via INTRAVENOUS
  Filled 2019-04-28 (×5): qty 100

## 2019-04-28 MED ORDER — NICOTINE 21 MG/24HR TD PT24
21.0000 mg | MEDICATED_PATCH | Freq: Every day | TRANSDERMAL | Status: DC
  Administered 2019-04-28 – 2019-05-04 (×6): 21 mg via TRANSDERMAL
  Filled 2019-04-28 (×6): qty 1

## 2019-04-28 MED ORDER — GADOBUTROL 1 MMOL/ML IV SOLN
7.5000 mL | Freq: Once | INTRAVENOUS | Status: AC | PRN
  Administered 2019-04-28: 7.5 mL via INTRAVENOUS

## 2019-04-28 NOTE — Progress Notes (Signed)
Bronchoscopy scheduled for 0800 04/29/2019

## 2019-04-28 NOTE — Consult Note (Signed)
Radiation Oncology         [phone] ________________________________  Name: Brandon Ferguson        MRN: [id number]  Date of Service:04/28/2019        DOB: [birth year]  WU:[id number], No Pcp Per  No ref. provider found     REFERRING PHYSICIAN: Dr. Sloan Leiter  DIAGNOSIS: The primary encounter diagnosis was Brain metastases ([place]). A diagnosis of Right sided weakness was also pertinent to this visit.   HISTORY OF PRESENT ILLNESS: Brandon Ferguson is a 58 y.o. male seen at the request of Dr. 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 The patient presented to the hospital via EMS after experiencing right-sided weakness and right facial droop.  He had had some difficulty walking and intermittent dizziness as well.  This had never been evaluated previously. Past medical history significant for hypertension he has never been diagnosed with any other medical issues.  In the emergency room there was no appreciable facial droop his cranial nerves appear intact and his tongue was midline.  No pronator drift was noted in the right upper extremity.  Mildly decreased grip strength on the right was seen with intact motor strength of flexion and extension of the upper and lower extremities.  He underwent a CT scan with stroke protocol without contrast revealing multiple brain masses and extensive edema in both frontal lobes concerning for metastatic disease without evidence of midline shift.  This morning he underwent an MRI of the brain with and without contrast revealing multiple enhancing brain metastases ranging from punctate to 26 mm in diameter, approximately 22 metastases were identified and the largest again measured about 23 to 26 mm with microhemorrhage and adjacent cystic 12 mm metastasis.  Associated mild regional dural thickening was noted, no other dural involvement or thickening with noted.  Confluent vasogenic edema in both frontal and parietal lobes are seen as  previously identified on the CT scan without regional mass-effect or midline shift.  Mass-effect was noted on both ventricles with no ventriculomegaly or transependymal edema.  His MRI of the cervical spine with and without contrast also confirmed the concerns for disease in the brain, no evidence of specific lesions were noted but there was abnormal marrow signal throughout much of the C-spine suspicious for widespread bone metastases there was concern for disease at T3 with possible early ventral epidural spread.  Degenerative changes were also noted in the cervical spine.  CT chest abdomen pelvis was performed revealing a 5.3 x 4.6 x 3.7 cm mass in the right upper lobe with irregular margins and surrounding gliotic groundglass opacity this causes mass-effect and narrowing of the right upper lobe bronchus the mass abuts the minor fissure with thickening extending laterally and abuts the major fissure with localized mass-effect and slight nodular protrusion into the lower lobe.  Emphysematous changes were noted.  A lytic lesion but T3 vertebral body with posterior cortex involvement and extension into the spinal canal was noted.  A lytic lesion involving the posterior right T1 vertebral body extending to the pedicle and T8 vertebral body lesion suspicious for hemangioma was also noted.  At T10 there was another lytic lesion suspicious for disease.  Within the abdomen and pelvis an 8 mm low-density lesion in the left lobe of liver the liver was identified.  He had prominent ureteral changes felt to be the result of mass-effect on the bladder and retrograde changes without obstruction.  He has aortobiiliac atherosclerotic changes without aneurysm.  His prostate  is 5 cm causing mass-effect on the bladder base.  He also his lytic changes in the lumbar and sacral spine as well as the right puboacetabular junction and pubic ramus.  Given these findings we were contacted to determine the next steps for treating his  presumed cancer.  He was started on dexamethasone 4 mg every 4 hours, and per the referring physician his symptoms have improved slightly.  He was seen by neurosurgery and is not a candidate for any surgical procedures.  We are contacting the patient to discuss options of palliative radiotherapy to the brain, possibly the chest,T-spine, and lumbosacral spine and right pelvis.   PREVIOUS RADIATION THERAPY: No   PAST MEDICAL HISTORY:  Past Medical History:  Diagnosis Date   Iron deficiency    Vitamin D deficiency        PAST SURGICAL HISTORY: Past Surgical History:  Procedure Laterality Date   KNEE SURGERY       FAMILY HISTORY:  Family History  Problem Relation Age of Onset   Breast cancer Mother    Cirrhosis Father    Alcohol abuse Father      SOCIAL HISTORY:  reports that he has been smoking cigarettes. He has been smoking about 1.00 pack per day. He has never used smokeless tobacco. He reports current alcohol use of about 6.0 standard drinks of alcohol per week. He reports current drug use. Drug: Marijuana. The patient is single and lives in [place]. He is in a relationship and his girlfriend is Financial controller. He has  sisters who help him with medical decision making [place], and Margaretha Sheffield, and he appoints the main contact as [place].   ALLERGIES: Patient has no known allergies.   MEDICATIONS:  Current Facility-Administered Medications  Medication Dose Route Frequency Provider Last Rate Last Dose   0.9 %  sodium chloride infusion  250 mL Intravenous PRN Jani Gravel, MD 10 mL/hr at 04/28/19 0923 250 mL at 04/28/19 0923   acetaminophen (TYLENOL) tablet 650 mg  650 mg Oral Q6H PRN Jani Gravel, MD       Or   acetaminophen (TYLENOL) suppository 650 mg  650 mg Rectal Q6H PRN Jani Gravel, MD       dexamethasone (DECADRON) injection 4 mg  4 mg Intravenous Q4H Jani Gravel, MD   4 mg at 04/28/19 0908   iohexol (OMNIPAQUE) 300 MG/ML solution 100 mL  100 mL Intravenous Once PRN  Jani Gravel, MD       levETIRAcetam (KEPPRA) IVPB 500 mg/100 mL premix  500 mg Intravenous BID Jani Gravel, MD 400 mL/hr at 04/28/19 0930 500 mg at 04/28/19 0930   LORazepam (ATIVAN) injection 0.5 mg  0.5 mg Intravenous Q6H PRN Barb Merino, MD       nicotine (NICODERM CQ - dosed in mg/24 hours) patch 21 mg  21 mg Transdermal Daily Ghimire, Dante Gang, MD       polyethylene glycol (MIRALAX / GLYCOLAX) packet 17 g  17 g Oral Daily Ghimire, Dante Gang, MD       sodium chloride flush (NS) 0.9 % injection 3 mL  3 mL Intravenous Q12H Jani Gravel, MD   3 mL at 04/28/19 0915   sodium chloride flush (NS) 0.9 % injection 3 mL  3 mL Intravenous PRN Jani Gravel, MD   3 mL at 04/27/19 2345     REVIEW OF SYSTEMS: On review of systems, the patient reports that he is doing well overall. He reports since starting steroids his right UE weakness has improved and denies  any hand numbness that he's previously experienced intermittently in the last week or two. He denies any chest pain, shortness of breath, cough, fevers, chills, night sweats, unintended weight changes, but his family states he has visibily had weight loss. He denies any bowel or bladder disturbances, and denies abdominal pain, nausea or vomiting. He reports new onset of low back pain in the last few weeks as well. He denies any numbness in his trunk. He has had some discomfort in the right hip region. He denies any other musculoskeletal or joint aches or pains. He reports he is not having any headaches, visual or auditory changes, and his facial droop has also improved since steroids have been administered. A complete review of systems is obtained and is otherwise negative.     PHYSICAL EXAM:  Wt Readings from Last 3 Encounters:  04/28/19 147 lb 0.8 oz (66.7 kg)   Temp Readings from Last 3 Encounters:  04/28/19 97.7 F (36.5 C) (Oral)   BP Readings from Last 3 Encounters:  04/28/19 135/78   Pulse Readings from Last 3 Encounters:  04/28/19 84    Pain Assessment Pain Score: 0-No pain/10  Unable to assess due to encounter type.  ECOG = 1  0 - Asymptomatic (Fully active, able to carry on all predisease activities without restriction)  1 - Symptomatic but completely ambulatory (Restricted in physically strenuous activity but ambulatory and able to carry out work of a light or sedentary nature. For example, light housework, office work)  2 - Symptomatic, <50% in bed during the day (Ambulatory and capable of all self care but unable to carry out any work activities. Up and about more than 50% of waking hours)  3 - Symptomatic, >50% in bed, but not bedbound (Capable of only limited self-care, confined to bed or chair 50% or more of waking hours)  4 - Bedbound (Completely disabled. Cannot carry on any self-care. Totally confined to bed or chair)  5 - Death   Eustace Pen MM, Creech RH, Tormey DC, et al. [phone]). "Toxicity and response criteria of the [facility]". [place] Oncol. 5 (6): 649-55    LABORATORY DATA:  Lab Results  Component Value Date   WBC 9.9 04/27/2019   HGB 11.3 (L) 04/27/2019   HCT 35.6 (L) 04/27/2019   MCV 92.2 04/27/2019   PLT 428 (H) 04/27/2019   Lab Results  Component Value Date   NA 135 04/27/2019   K 3.4 (L) 04/27/2019   CL 102 04/27/2019   CO2 24 04/27/2019   Lab Results  Component Value Date   ALT 14 04/27/2019   AST 20 04/27/2019   ALKPHOS 100 04/27/2019   BILITOT 0.6 04/27/2019      RADIOGRAPHY: Ct Chest W Contrast  Result Date: 04/28/2019 CLINICAL DATA:  Brain metastasis with unknown primary. EXAM: CT CHEST, ABDOMEN, AND PELVIS WITH CONTRAST TECHNIQUE: Multidetector CT imaging of the chest, abdomen and pelvis was performed following the standard protocol during bolus administration of intravenous contrast. CONTRAST:  100 cc Omnipaque 300 IV COMPARISON:  No prior chest or abdominal imaging. FINDINGS: CT CHEST FINDINGS Cardiovascular: Thoracic aorta is normal in  caliber. Trace atherosclerosis of the aortic arch. Small amount pericardial fluid in the superior pericardial recess. Small coronary artery calcifications. Heart is normal in size. Mediastinum/Nodes: Probable 12 mm right hilar node, contiguous with right upper lobe perihilar mass. No enlarged mediastinal lymph nodes. No left hilar adenopathy. No supraclavicular adenopathy. Visualized thyroid gland is normal. Decompressed esophagus. Lungs/Pleura:  Perihilar right upper lobe 5.3 x 4.6 x 3.7 cm pulmonary mass has irregular margins and surrounding ground-glass opacity. This causes mass effect and narrowing of the right upper lobe bronchus. Mass abuts the minor fissure with fissural thickening extending laterally. Mass abuts the major fissure with localized mass effect slight nodular protrusion a possible extension into the lower lobe, for example series 7, image 48. Biapical emphysema with bulla at the left lung apex. No additional pulmonary mass or nodule. Subpleural atelectasis in the dependent left lower lobe. Musculoskeletal: Bone marrow diffusely heterogeneous in density. Lytic lesion with posterior left T3 vertebral body with posterior cortex involvement and extension to the spinal canal. Lytic lesion involving posterior right T1 vertebral body extending into the pedicle. T8 vertebral body lesion may represent a hemangioma but is nonspecific. Lytic lesion within T10 suspicious for metastatic disease. CT ABDOMEN PELVIS FINDINGS Hepatobiliary: Vague 8 mm low-density lesion in the left lobe of the liver, series 3, image 57. Focal fatty infiltration adjacent to the falciform ligament. Gallbladder is unremarkable. No biliary dilatation. Pancreas: Unremarkable. No pancreatic ductal dilatation or surrounding inflammatory changes. No evidence of pancreatic mass. Spleen: Normal in size without focal abnormality. Adrenals/Urinary Tract: No adrenal nodule. Left greater than right pelvicaliectasis and prominence of the  ureters, likely related to bladder distension. No ureteral stones or cause of obstruction. Urinary bladder is distended without wall thickening. Homogeneous renal enhancement with symmetric excretion on delayed phase imaging. No evidence of focal renal mass. Stomach/Bowel: Stomach is within normal limits. Appendix appears normal. No evidence of bowel wall thickening, distention, or inflammatory changes. No evidence of colonic or small bowel mass. Vascular/Lymphatic: Aorto bi-iliac atherosclerosis. No aneurysm. Patent portal vein. No retroperitoneal adenopathy. No mesenteric or upper abdominal adenopathy. 7 mm right external iliac node is nonspecific. No enlarged inguinal nodes. Reproductive: Heterogeneous enlarged prostate gland spanning 5 cm. This causes mild mass effect on the bladder base. Other: No ascites, free air, or omental nodularity. Small fat containing umbilical hernia. Musculoskeletal: Bone marrow diffusely heterogeneous. Lytic lesion within L4 vertebral body with mild pathologic compression fracture. Lesion extends into the right pedicle. Lytic lesion within anterior L5 vertebral body. Lytic lesion within the right superior pubic ramus extending to the puboacetabular junction. Suspected small central lytic lesion in the sacrum. Suspected small lytic lesion in the left anterior iliac bone and left iliac crest. IMPRESSION: 1. Right upper lobe perihilar mass measuring 5.3 x 4.6 x 3.7 cm consistent with primary bronchogenic malignancy. Mass abuts the major and minor minor fissure with possible extension into the lower lobe. 2. Probable 12 mm right hilar node, contiguous with the right upper lobe perihilar mass. 3. Osseous metastatic disease with multiple lytic lesions in the thoracic and lumbar spine, pelvis and sacrum. 4. Vague 8 mm low-density lesion in the left lobe of the liver, nonspecific in the setting. 5. Enlarged prostate gland causing mild mass effect on the bladder base. 6. Urinary bladder and  bilateral renal collecting system distention may be due to mass effect from enlarged prostate gland. Aortic Atherosclerosis (ICD10-I70.0) and Emphysema (ICD10-J43.9). Electronically Signed   By: Keith Rake M.D.   On: 04/28/2019 03:42   Mr Jeri Cos FU Contrast  Result Date: 04/28/2019 CLINICAL DATA:  58 year old male with code stroke presentation, plain head CT suggesting metastatic disease to the brain. EXAM: MRI HEAD WITHOUT AND WITH CONTRAST MRI CERVICAL SPINE WITHOUT AND WITH CONTRAST TECHNIQUE: Multiplanar, multiecho pulse sequences of the brain and surrounding structures, and cervical spine, to include the craniocervical  junction and cervicothoracic junction, were obtained without and with intravenous contrast. CONTRAST:  7.73mL GADAVIST GADOBUTROL 1 MMOL/ML IV SOLN COMPARISON:  Head CT 04/27/2019. FINDINGS: MRI HEAD FINDINGS Brain: Multiple enhancing brain metastases ranging from punctate to 26 millimeters diameter. Approximately 22 individual metastases are identified, with 1 or 2 additional questionable appearing areas (small left choroid plexus versus subependymal metastasis on series 38, image 28). There is an unusual trans-falcine lobulated and enhancing mass measuring about 23 millimeters (series 39, image 13) with microhemorrhage and an adjacent more cystic 12 centimeter metastasis. Associated mild regional dural thickening. All of the other lesions larger than 1 centimeter are cystic and rim enhancing. No other dural involvement or dural thickening identified. No leptomeningeal tumor identified. Confluent vasogenic edema in both frontal and parietal lobes as seen by CT earlier today. Regional mass effect with no midline shift or loss of basilar cisterns. Mass effect on both lateral ventricles with no ventriculomegaly or transependymal edema. No superimposed restricted diffusion suggestive of acute infarction. No acute intracranial hemorrhage. Cervicomedullary junction and pituitary are within  normal limits. Vascular: Major intracranial vascular flow voids are preserved. The major dural venous sinuses are enhancing and appear to be patent. Skull and upper cervical spine: Cervical spine reported separately. Calvarium bone marrow signal is within normal limits. Sinuses/Orbits: Negative orbits. Trace paranasal sinus mucosal thickening. Other: Mastoids are clear. Visible internal auditory structures appear normal. Scalp and face soft tissues appear negative. MRI CERVICAL SPINE FINDINGS Alignment: Mild straightening of cervical lordosis. No spondylolisthesis. Vertebrae: Abnormal marrow signal from the C3 vertebra into the visible upper thoracic spine, although little abnormal enhancement and marrow edema in the cervical vertebrae. A 12 millimeter T1 and T2 heterogeneous lesion in the anterior C3 body most resembles a benign hemangioma. There is a small enhancing metastasis in the superior right T1 body. There is subtotal T3 vertebral body replacement by enhancing tumor. Cord: Spinal cord signal is within normal limits at all visualized levels. Posterior Fossa, vertebral arteries, paraspinal tissues: Preserved major vascular flow voids in the neck. No cervical lymphadenopathy identified. Disc levels: No cervical spine epidural or extraosseous tumor identified. However, there does appear to be early ventral epidural space extension of tumor at the left T3 body. There is superimposed cervical spine degeneration which results in spinal stenosis with mild spinal cord mass effect at C4-C5, C5-C6, and C6-C7. No associated cord signal abnormality. IMPRESSION: 1. 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 Approximately 22 individual brain metastases ranging from punctate to 26 mm diameter, including an unusual trans-falcine lesion with some associated microhemorrhage and dural involvement. 2. Confluent cerebral vasogenic edema with no midline shift or loss of basilar cisterns. 3. Abnormal marrow signal throughout much  of the cervical spine suspicious for widespread bone metastases although enhancing metastases are only confirmed in the visible upper thoracic spine (including with early ventral epidural spread at T3). 4. Superimposed cervical spine degeneration with degenerative spinal stenosis at C4-C5 through C6-C7 with degenerative spinal cord mass effect but no associated cord signal abnormality. Electronically Signed   By: Genevie Ann M.D.   On: 04/28/2019 03:06   Mr Cervical Spine W Wo Contrast  Result Date: 04/28/2019 CLINICAL DATA:  58 year old male with code stroke presentation, plain head CT suggesting metastatic disease to the brain. EXAM: MRI HEAD WITHOUT AND WITH CONTRAST MRI CERVICAL SPINE WITHOUT AND WITH CONTRAST TECHNIQUE: Multiplanar, multiecho pulse sequences of the brain and surrounding structures, and cervical spine, to include the craniocervical junction and cervicothoracic junction, were obtained without and  with intravenous contrast. CONTRAST:  7.30mL GADAVIST GADOBUTROL 1 MMOL/ML IV SOLN COMPARISON:  Head CT 04/27/2019. FINDINGS: MRI HEAD FINDINGS Brain: Multiple enhancing brain metastases ranging from punctate to 26 millimeters diameter. Approximately 22 individual metastases are identified, with 1 or 2 additional questionable appearing areas (small left choroid plexus versus subependymal metastasis on series 38, image 28). There is an unusual trans-falcine lobulated and enhancing mass measuring about 23 millimeters (series 39, image 13) with microhemorrhage and an adjacent more cystic 12 centimeter metastasis. Associated mild regional dural thickening. All of the other lesions larger than 1 centimeter are cystic and rim enhancing. No other dural involvement or dural thickening identified. No leptomeningeal tumor identified. Confluent vasogenic edema in both frontal and parietal lobes as seen by CT earlier today. Regional mass effect with no midline shift or loss of basilar cisterns. Mass effect on both  lateral ventricles with no ventriculomegaly or transependymal edema. No superimposed restricted diffusion suggestive of acute infarction. No acute intracranial hemorrhage. Cervicomedullary junction and pituitary are within normal limits. Vascular: Major intracranial vascular flow voids are preserved. The major dural venous sinuses are enhancing and appear to be patent. Skull and upper cervical spine: Cervical spine reported separately. Calvarium bone marrow signal is within normal limits. Sinuses/Orbits: Negative orbits. Trace paranasal sinus mucosal thickening. Other: Mastoids are clear. Visible internal auditory structures appear normal. Scalp and face soft tissues appear negative. MRI CERVICAL SPINE FINDINGS Alignment: Mild straightening of cervical lordosis. No spondylolisthesis. Vertebrae: Abnormal marrow signal from the C3 vertebra into the visible upper thoracic spine, although little abnormal enhancement and marrow edema in the cervical vertebrae. A 12 millimeter T1 and T2 heterogeneous lesion in the anterior C3 body most resembles a benign hemangioma. There is a small enhancing metastasis in the superior right T1 body. There is subtotal T3 vertebral body replacement by enhancing tumor. Cord: Spinal cord signal is within normal limits at all visualized levels. Posterior Fossa, vertebral arteries, paraspinal tissues: Preserved major vascular flow voids in the neck. No cervical lymphadenopathy identified. Disc levels: No cervical spine epidural or extraosseous tumor identified. However, there does appear to be early ventral epidural space extension of tumor at the left T3 body. There is superimposed cervical spine degeneration which results in spinal stenosis with mild spinal cord mass effect at C4-C5, C5-C6, and C6-C7. No associated cord signal abnormality. IMPRESSION: 1. 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 Approximately 22 individual brain metastases ranging from punctate to 26 mm diameter, including  an unusual trans-falcine lesion with some associated microhemorrhage and dural involvement. 2. Confluent cerebral vasogenic edema with no midline shift or loss of basilar cisterns. 3. Abnormal marrow signal throughout much of the cervical spine suspicious for widespread bone metastases although enhancing metastases are only confirmed in the visible upper thoracic spine (including with early ventral epidural spread at T3). 4. Superimposed cervical spine degeneration with degenerative spinal stenosis at C4-C5 through C6-C7 with degenerative spinal cord mass effect but no associated cord signal abnormality. Electronically Signed   By: Genevie Ann M.D.   On: 04/28/2019 03:06   Ct Abdomen Pelvis W Contrast  Result Date: 04/28/2019 CLINICAL DATA:  Brain metastasis with unknown primary. EXAM: CT CHEST, ABDOMEN, AND PELVIS WITH CONTRAST TECHNIQUE: Multidetector CT imaging of the chest, abdomen and pelvis was performed following the standard protocol during bolus administration of intravenous contrast. CONTRAST:  100 cc Omnipaque 300 IV COMPARISON:  No prior chest or abdominal imaging. FINDINGS: CT CHEST FINDINGS Cardiovascular: Thoracic aorta is normal in caliber. Trace atherosclerosis of  the aortic arch. Small amount pericardial fluid in the superior pericardial recess. Small coronary artery calcifications. Heart is normal in size. Mediastinum/Nodes: Probable 12 mm right hilar node, contiguous with right upper lobe perihilar mass. No enlarged mediastinal lymph nodes. No left hilar adenopathy. No supraclavicular adenopathy. Visualized thyroid gland is normal. Decompressed esophagus. Lungs/Pleura: Perihilar right upper lobe 5.3 x 4.6 x 3.7 cm pulmonary mass has irregular margins and surrounding ground-glass opacity. This causes mass effect and narrowing of the right upper lobe bronchus. Mass abuts the minor fissure with fissural thickening extending laterally. Mass abuts the major fissure with localized mass effect slight  nodular protrusion a possible extension into the lower lobe, for example series 7, image 48. Biapical emphysema with bulla at the left lung apex. No additional pulmonary mass or nodule. Subpleural atelectasis in the dependent left lower lobe. Musculoskeletal: Bone marrow diffusely heterogeneous in density. Lytic lesion with posterior left T3 vertebral body with posterior cortex involvement and extension to the spinal canal. Lytic lesion involving posterior right T1 vertebral body extending into the pedicle. T8 vertebral body lesion may represent a hemangioma but is nonspecific. Lytic lesion within T10 suspicious for metastatic disease. CT ABDOMEN PELVIS FINDINGS Hepatobiliary: Vague 8 mm low-density lesion in the left lobe of the liver, series 3, image 57. Focal fatty infiltration adjacent to the falciform ligament. Gallbladder is unremarkable. No biliary dilatation. Pancreas: Unremarkable. No pancreatic ductal dilatation or surrounding inflammatory changes. No evidence of pancreatic mass. Spleen: Normal in size without focal abnormality. Adrenals/Urinary Tract: No adrenal nodule. Left greater than right pelvicaliectasis and prominence of the ureters, likely related to bladder distension. No ureteral stones or cause of obstruction. Urinary bladder is distended without wall thickening. Homogeneous renal enhancement with symmetric excretion on delayed phase imaging. No evidence of focal renal mass. Stomach/Bowel: Stomach is within normal limits. Appendix appears normal. No evidence of bowel wall thickening, distention, or inflammatory changes. No evidence of colonic or small bowel mass. Vascular/Lymphatic: Aorto bi-iliac atherosclerosis. No aneurysm. Patent portal vein. No retroperitoneal adenopathy. No mesenteric or upper abdominal adenopathy. 7 mm right external iliac node is nonspecific. No enlarged inguinal nodes. Reproductive: Heterogeneous enlarged prostate gland spanning 5 cm. This causes mild mass effect on  the bladder base. Other: No ascites, free air, or omental nodularity. Small fat containing umbilical hernia. Musculoskeletal: Bone marrow diffusely heterogeneous. Lytic lesion within L4 vertebral body with mild pathologic compression fracture. Lesion extends into the right pedicle. Lytic lesion within anterior L5 vertebral body. Lytic lesion within the right superior pubic ramus extending to the puboacetabular junction. Suspected small central lytic lesion in the sacrum. Suspected small lytic lesion in the left anterior iliac bone and left iliac crest. IMPRESSION: 1. Right upper lobe perihilar mass measuring 5.3 x 4.6 x 3.7 cm consistent with primary bronchogenic malignancy. Mass abuts the major and minor minor fissure with possible extension into the lower lobe. 2. Probable 12 mm right hilar node, contiguous with the right upper lobe perihilar mass. 3. Osseous metastatic disease with multiple lytic lesions in the thoracic and lumbar spine, pelvis and sacrum. 4. Vague 8 mm low-density lesion in the left lobe of the liver, nonspecific in the setting. 5. Enlarged prostate gland causing mild mass effect on the bladder base. 6. Urinary bladder and bilateral renal collecting system distention may be due to mass effect from enlarged prostate gland. Aortic Atherosclerosis (ICD10-I70.0) and Emphysema (ICD10-J43.9). Electronically Signed   By: Keith Rake M.D.   On: 04/28/2019 03:42   Ct Head Code Stroke  Wo Contrast  Result Date: 04/27/2019 CLINICAL DATA:  Code stroke.  Right-sided weakness. EXAM: CT HEAD WITHOUT CONTRAST TECHNIQUE: Contiguous axial images were obtained from the base of the skull through the vertex without intravenous contrast. COMPARISON:  None. FINDINGS: Brain: Rounded low-density masses posteriorly in both frontal lobes near the vertex each measure 1.7 cm, and there is a 2.5 cm low-density mass more anteriorly in the left frontal lobe. There is extensive vasogenic edema in the left greater than  right frontal lobes with regional sulcal effacement and slight mass effect on the lateral ventricles without midline shift. A small area of edema is noted in the right occipital lobe without a presumed underlying lesion visible on this unenhanced study. No acute cortically based infarct, intracranial hemorrhage, or extra-axial fluid collection is identified. Vascular: No hyperdense vessel. Skull: No fracture or focal osseous lesion. Sinuses/Orbits: Visualized paranasal sinuses and mastoid air cells are clear. Orbits are unremarkable. Other: None. ASPECTS [facility] Stroke Program Early CT Score) Not scored due to the presence of multiple cerebral masses. IMPRESSION: 1. 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 Brain MRI without and with contrast is recommended for further evaluation. 2. No midline shift or intracranial hemorrhage. These results were called by telephone at the time of interpretation on 04/27/2019 at 6:45 pm to Dr. Ezequiel Essex, who verbally acknowledged these results. Electronically Signed   By: Logan Bores M.D.   On: 04/27/2019 18:47       IMPRESSION/PLAN: 1. 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 Dr. Lisbeth Renshaw has reviewed the patients imaging and it appears he likely has a metastatic lung cancer. We agree with completing his work up and having pulmonary see this patient. It looks like his tumor is closely intimate with his right mainstem bronchus so hopefully the agree that bronchoscopy could be the best modality for tissue diagnosis. Once we have tissue confirmation we'd like to offer whole brain radiotherapy given the findings. We could also consider palliative radiotherapy to the chest to avoid obstruction and subsequent pneumonia as he will need an outpatient PET scan prior to systemic therapy with medical oncology. He would also benefit from palliative radiotherapy to T3 region of his spine as  well as the lumbar spine and right acetabulum/pubic ramus. We discussed the risks, benefits, short, and long term effects of whole brain and palliative radiotherapy to the sites mentioned, and the patient is interested in proceeding. Dr. Lisbeth Renshaw recommends  a course of 2 weeks of radiotherapy to these sites. We will plan for simulation with care link coordination tomorrow and he will need to be at the cancer center as soon as he's stable for carelink transport in the am. We will plan to begin his radiotherapy on Monday. His girlfriend and sisters state agreement and understanding as well. He is also aware of the need for outpatient PET imaging and meeting with medical oncology as an outpatient to discuss systemic therapy. An order is placed for medical oncology outpatient referral to [name] as he resides in [place].  In a visit lasting 70 minutes, greater than 50% of the time was spent in teleconferencing with the patient his two sisters and girlfriend, and in floor time, coordinating the patient's care.      Carola Rhine, PAC

## 2019-04-28 NOTE — Consult Note (Signed)
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 male.  HPI: 58 year old gentleman with no significant past medical history who presented with altered mental status work-up with CT scan showed the possibility of multiple brain mets MRI scan confirmed as well as CT scan chest abdomen pelvis and MRI of cervical spine showed bony mets as well as a hilar mass consistent with probable bronchogenic carcinoma and questionable liver and abdominal mets.  Patient reports he has had one other episode like this a month ago when he was working noticed some twitching in his right hand.  Past Medical History:  Diagnosis Date  . Iron deficiency   . Vitamin D deficiency     Past Surgical History:  Procedure Laterality Date  . KNEE SURGERY      Family History  Problem Relation Age of Onset  . Breast cancer Mother   . Cirrhosis Father   . Alcohol abuse Father     Social History:  reports that he has been smoking cigarettes. He has been smoking about 1.00 pack per day. He has never used smokeless tobacco. He reports current alcohol use of about 6.0 standard drinks of alcohol per week. He reports current drug use. Drug: Marijuana.  Allergies: No Known Allergies  Medications: I have reviewed the patient's current medications.  Results for orders placed or performed during the hospital encounter of 04/27/19 (from the past 48 hour(s))  Urine rapid drug screen (hosp performed)     Status: None   Collection Time: 04/27/19  6:25 PM  Result Value Ref Range   Opiates NONE DETECTED NONE DETECTED   Cocaine NONE DETECTED NONE DETECTED   Benzodiazepines NONE DETECTED NONE DETECTED   Amphetamines NONE DETECTED NONE DETECTED   Tetrahydrocannabinol NONE DETECTED NONE DETECTED   Barbiturates NONE DETECTED NONE DETECTED    Comment: (NOTE) DRUG SCREEN FOR MEDICAL PURPOSES ONLY.  IF CONFIRMATION IS NEEDED FOR ANY  PURPOSE, NOTIFY LAB WITHIN 5 DAYS. LOWEST DETECTABLE LIMITS FOR URINE DRUG SCREEN Drug Class                     Cutoff (ng/mL) Amphetamine and metabolites    1000 Barbiturate and metabolites    200 Benzodiazepine                 546 Tricyclics and metabolites     300 Opiates and metabolites        300 Cocaine and metabolites        300 THC                            50 Performed at [facility], [address] [place], [place] [address]   Urinalysis, Routine w reflex microscopic     Status: Abnormal   Collection Time: 04/27/19  6:25 PM  Result Value Ref Range   Color, Urine STRAW (A) YELLOW   APPearance CLEAR CLEAR   Specific Gravity, Urine 1.005 1.005 - 1.030   pH 6.0 5.0 - 8.0   Glucose, UA NEGATIVE NEGATIVE mg/dL   Hgb urine dipstick NEGATIVE NEGATIVE   Bilirubin Urine NEGATIVE NEGATIVE   Ketones, ur NEGATIVE NEGATIVE mg/dL   Protein, ur NEGATIVE NEGATIVE mg/dL   Nitrite NEGATIVE NEGATIVE   Leukocytes,Ua NEGATIVE NEGATIVE    Comment: Performed at [facility], [address] [place], [place] [address]  Ethanol     Status:  None   Collection Time: 04/27/19  6:35 PM  Result Value Ref Range   Alcohol, Ethyl (B) <10 <10 mg/dL    Comment: (NOTE) Lowest detectable limit for serum alcohol is 10 mg/dL. For medical purposes only. Performed at [facility], [address], [place], [place] [address]   Protime-INR     Status: Abnormal   Collection Time: 04/27/19  6:35 PM  Result Value Ref Range   Prothrombin Time 15.5 (H) 11.4 - 15.2 seconds   INR 1.2 0.8 - 1.2    Comment: (NOTE) INR goal varies based on device and disease states. Performed at [facility], [address], [place], [place] [address]   APTT     Status: None   Collection Time: 04/27/19  6:35 PM  Result Value Ref Range   aPTT 34 24 - 36 seconds    Comment: Performed at [facility], [address], [place], [place] [address]  CBC     Status: Abnormal   Collection Time: 04/27/19  6:35 PM  Result Value Ref Range    WBC 9.9 4.0 - 10.5 K/uL   RBC 3.86 (L) 4.22 - 5.81 MIL/uL   Hemoglobin 11.3 (L) 13.0 - 17.0 g/dL   HCT 35.6 (L) 39.0 - 52.0 %   MCV 92.2 80.0 - 100.0 fL   MCH 29.3 26.0 - 34.0 pg   MCHC 31.7 30.0 - 36.0 g/dL   RDW 12.7 11.5 - 15.5 %   Platelets 428 (H) 150 - 400 K/uL   nRBC 0.0 0.0 - 0.2 %    Comment: Performed at [facility], [address], [place], [place] [address]  Differential     Status: None   Collection Time: 04/27/19  6:35 PM  Result Value Ref Range   Neutrophils Relative % 58 %   Neutro Abs 5.7 1.7 - 7.7 K/uL   Lymphocytes Relative 31 %   Lymphs Abs 3.0 0.7 - 4.0 K/uL   Monocytes Relative 8 %   Monocytes Absolute 0.8 0.1 - 1.0 K/uL   Eosinophils Relative 3 %   Eosinophils Absolute 0.3 0.0 - 0.5 K/uL   Basophils Relative 0 %   Basophils Absolute 0.0 0.0 - 0.1 K/uL   Immature Granulocytes 0 %   Abs Immature Granulocytes 0.02 0.00 - 0.07 K/uL    Comment: Performed at [facility], [address] [place], [place] [address]  Comprehensive metabolic panel     Status: Abnormal   Collection Time: 04/27/19  6:35 PM  Result Value Ref Range   Sodium 135 135 - 145 mmol/L   Potassium 3.4 (L) 3.5 - 5.1 mmol/L   Chloride 102 98 - 111 mmol/L   CO2 24 22 - 32 mmol/L   Glucose, Bld 153 (H) 70 - 99 mg/dL   BUN 14 6 - 20 mg/dL   Creatinine, Ser 0.77 0.61 - 1.24 mg/dL   Calcium 9.4 8.9 - 10.3 mg/dL   Total Protein 8.4 (H) 6.5 - 8.1 g/dL   Albumin 3.2 (L) 3.5 - 5.0 g/dL   AST 20 15 - 41 U/L   ALT 14 0 - 44 U/L   Alkaline Phosphatase 100 38 - 126 U/L   Total Bilirubin 0.6 0.3 - 1.2 mg/dL   GFR calc non Af Amer >60 >60 mL/min   GFR calc Af Amer >60 >60 mL/min   Anion gap 9 5 - 15    Comment: Performed at [facility], [address], [place],  [address]  MRSA PCR Screening  Status: None   Collection Time: 04/28/19 12:50 AM   Specimen: Nasal Mucosa; Nasopharyngeal  Result Value Ref Range   MRSA by PCR NEGATIVE NEGATIVE    Comment:        The GeneXpert MRSA Assay  (FDA approved for NASAL specimens only), is one component of a comprehensive MRSA colonization surveillance program. It is not intended to diagnose MRSA infection nor to guide or monitor treatment for MRSA infections. Performed at [facility], [place] [address] [place], [place] [address]     Ct Chest W Contrast  Result Date: 04/28/2019 CLINICAL DATA:  Brain metastasis with unknown primary. EXAM: CT CHEST, ABDOMEN, AND PELVIS WITH CONTRAST TECHNIQUE: Multidetector CT imaging of the chest, abdomen and pelvis was performed following the standard protocol during bolus administration of intravenous contrast. CONTRAST:  100 cc Omnipaque 300 IV COMPARISON:  No prior chest or abdominal imaging. FINDINGS: CT CHEST FINDINGS Cardiovascular: Thoracic aorta is normal in caliber. Trace atherosclerosis of the aortic arch. Small amount pericardial fluid in the superior pericardial recess. Small coronary artery calcifications. Heart is normal in size. Mediastinum/Nodes: Probable 12 mm right hilar node, contiguous with right upper lobe perihilar mass. No enlarged mediastinal lymph nodes. No left hilar adenopathy. No supraclavicular adenopathy. Visualized thyroid gland is normal. Decompressed esophagus. Lungs/Pleura: Perihilar right upper lobe 5.3 x 4.6 x 3.7 cm pulmonary mass has irregular margins and surrounding ground-glass opacity. This causes mass effect and narrowing of the right upper lobe bronchus. Mass abuts the minor fissure with fissural thickening extending laterally. Mass abuts the major fissure with localized mass effect slight nodular protrusion a possible extension into the lower lobe, for example series 7, image 48. Biapical emphysema with bulla at the left lung apex. No additional pulmonary mass or nodule. Subpleural atelectasis in the dependent left lower lobe. Musculoskeletal: Bone marrow diffusely heterogeneous in density. Lytic lesion with posterior left T3 vertebral body with posterior  cortex involvement and extension to the spinal canal. Lytic lesion involving posterior right T1 vertebral body extending into the pedicle. T8 vertebral body lesion may represent a hemangioma but is nonspecific. Lytic lesion within T10 suspicious for metastatic disease. CT ABDOMEN PELVIS FINDINGS Hepatobiliary: Vague 8 mm low-density lesion in the left lobe of the liver, series 3, image 57. Focal fatty infiltration adjacent to the falciform ligament. Gallbladder is unremarkable. No biliary dilatation. Pancreas: Unremarkable. No pancreatic ductal dilatation or surrounding inflammatory changes. No evidence of pancreatic mass. Spleen: Normal in size without focal abnormality. Adrenals/Urinary Tract: No adrenal nodule. Left greater than right pelvicaliectasis and prominence of the ureters, likely related to bladder distension. No ureteral stones or cause of obstruction. Urinary bladder is distended without wall thickening. Homogeneous renal enhancement with symmetric excretion on delayed phase imaging. No evidence of focal renal mass. Stomach/Bowel: Stomach is within normal limits. Appendix appears normal. No evidence of bowel wall thickening, distention, or inflammatory changes. No evidence of colonic or small bowel mass. Vascular/Lymphatic: Aorto bi-iliac atherosclerosis. No aneurysm. Patent portal vein. No retroperitoneal adenopathy. No mesenteric or upper abdominal adenopathy. 7 mm right external iliac node is nonspecific. No enlarged inguinal nodes. Reproductive: Heterogeneous enlarged prostate gland spanning 5 cm. This causes mild mass effect on the bladder base. Other: No ascites, free air, or omental nodularity. Small fat containing umbilical hernia. Musculoskeletal: Bone marrow diffusely heterogeneous. Lytic lesion within L4 vertebral body with mild pathologic compression fracture. Lesion extends into the right pedicle. Lytic lesion within anterior L5 vertebral body. Lytic lesion within the right superior pubic  ramus extending to the puboacetabular junction. Suspected small central lytic lesion in the sacrum. Suspected small lytic lesion in the left anterior iliac bone and left iliac crest. IMPRESSION: 1. Right upper lobe perihilar mass measuring 5.3 x 4.6 x 3.7 cm consistent with primary bronchogenic malignancy. Mass abuts the major and minor minor fissure with possible extension into the lower lobe. 2. Probable 12 mm right hilar node, contiguous with the right upper lobe perihilar mass. 3. Osseous metastatic disease with multiple lytic lesions in the thoracic and lumbar spine, pelvis and sacrum. 4. Vague 8 mm low-density lesion in the left lobe of the liver, nonspecific in the setting. 5. Enlarged prostate gland causing mild mass effect on the bladder base. 6. Urinary bladder and bilateral renal collecting system distention may be due to mass effect from enlarged prostate gland. Aortic Atherosclerosis (ICD10-I70.0) and Emphysema (ICD10-J43.9). Electronically Signed   By: Keith Rake M.D.   On: 04/28/2019 03:42   Brandon Ferguson AS Contrast  Result Date: 04/28/2019 CLINICAL DATA:  58 year old male with code stroke presentation, plain head CT suggesting metastatic disease to the brain. EXAM: MRI HEAD WITHOUT AND WITH CONTRAST MRI CERVICAL SPINE WITHOUT AND WITH CONTRAST TECHNIQUE: Multiplanar, multiecho pulse sequences of the brain and surrounding structures, and cervical spine, to include the craniocervical junction and cervicothoracic junction, were obtained without and with intravenous contrast. CONTRAST:  7.2mL GADAVIST GADOBUTROL 1 MMOL/ML IV SOLN COMPARISON:  Head CT 04/27/2019. FINDINGS: MRI HEAD FINDINGS Brain: Multiple enhancing brain metastases ranging from punctate to 26 millimeters diameter. Approximately 22 individual metastases are identified, with 1 or 2 additional questionable appearing areas (small left choroid plexus versus subependymal metastasis on series 38, image 28). There is an unusual  trans-falcine lobulated and enhancing mass measuring about 23 millimeters (series 39, image 13) with microhemorrhage and an adjacent more cystic 12 centimeter metastasis. Associated mild regional dural thickening. All of the other lesions larger than 1 centimeter are cystic and rim enhancing. No other dural involvement or dural thickening identified. No leptomeningeal tumor identified. Confluent vasogenic edema in both frontal and parietal lobes as seen by CT earlier today. Regional mass effect with no midline shift or loss of basilar cisterns. Mass effect on both lateral ventricles with no ventriculomegaly or transependymal edema. No superimposed restricted diffusion suggestive of acute infarction. No acute intracranial hemorrhage. Cervicomedullary junction and pituitary are within normal limits. Vascular: Major intracranial vascular flow voids are preserved. The major dural venous sinuses are enhancing and appear to be patent. Skull and upper cervical spine: Cervical spine reported separately. Calvarium bone marrow signal is within normal limits. Sinuses/Orbits: Negative orbits. Trace paranasal sinus mucosal thickening. Other: Mastoids are clear. Visible internal auditory structures appear normal. Scalp and face soft tissues appear negative. MRI CERVICAL SPINE FINDINGS Alignment: Mild straightening of cervical lordosis. No spondylolisthesis. Vertebrae: Abnormal marrow signal from the C3 vertebra into the visible upper thoracic spine, although little abnormal enhancement and marrow edema in the cervical vertebrae. A 12 millimeter T1 and T2 heterogeneous lesion in the anterior C3 body most resembles a benign hemangioma. There is a small enhancing metastasis in the superior right T1 body. There is subtotal T3 vertebral body replacement by enhancing tumor. Cord: Spinal cord signal is within normal limits at all visualized levels. Posterior Fossa, vertebral arteries, paraspinal tissues: Preserved major vascular flow  voids in the neck. No cervical lymphadenopathy identified. Disc levels: No cervical spine epidural or extraosseous tumor identified. However, there does appear to be early ventral epidural space extension of  tumor at the left T3 body. There is superimposed cervical spine degeneration which results in spinal stenosis with mild spinal cord mass effect at C4-C5, C5-C6, and C6-C7. No associated cord signal abnormality. IMPRESSION: 1. 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 Approximately 22 individual brain metastases ranging from punctate to 26 mm diameter, including an unusual trans-falcine lesion with some associated microhemorrhage and dural involvement. 2. Confluent cerebral vasogenic edema with no midline shift or loss of basilar cisterns. 3. Abnormal marrow signal throughout much of the cervical spine suspicious for widespread bone metastases although enhancing metastases are only confirmed in the visible upper thoracic spine (including with early ventral epidural spread at T3). 4. Superimposed cervical spine degeneration with degenerative spinal stenosis at C4-C5 through C6-C7 with degenerative spinal cord mass effect but no associated cord signal abnormality. Electronically Signed   By: Genevie Ann M.D.   On: 04/28/2019 03:06   Brandon Cervical Spine W Wo Contrast  Result Date: 04/28/2019 CLINICAL DATA:  58 year old male with code stroke presentation, plain head CT suggesting metastatic disease to the brain. EXAM: MRI HEAD WITHOUT AND WITH CONTRAST MRI CERVICAL SPINE WITHOUT AND WITH CONTRAST TECHNIQUE: Multiplanar, multiecho pulse sequences of the brain and surrounding structures, and cervical spine, to include the craniocervical junction and cervicothoracic junction, were obtained without and with intravenous contrast. CONTRAST:  7.10mL GADAVIST GADOBUTROL 1 MMOL/ML IV SOLN COMPARISON:  Head CT 04/27/2019. FINDINGS: MRI HEAD FINDINGS Brain: Multiple enhancing brain metastases ranging from punctate to 26  millimeters diameter. Approximately 22 individual metastases are identified, with 1 or 2 additional questionable appearing areas (small left choroid plexus versus subependymal metastasis on series 38, image 28). There is an unusual trans-falcine lobulated and enhancing mass measuring about 23 millimeters (series 39, image 13) with microhemorrhage and an adjacent more cystic 12 centimeter metastasis. Associated mild regional dural thickening. All of the other lesions larger than 1 centimeter are cystic and rim enhancing. No other dural involvement or dural thickening identified. No leptomeningeal tumor identified. Confluent vasogenic edema in both frontal and parietal lobes as seen by CT earlier today. Regional mass effect with no midline shift or loss of basilar cisterns. Mass effect on both lateral ventricles with no ventriculomegaly or transependymal edema. No superimposed restricted diffusion suggestive of acute infarction. No acute intracranial hemorrhage. Cervicomedullary junction and pituitary are within normal limits. Vascular: Major intracranial vascular flow voids are preserved. The major dural venous sinuses are enhancing and appear to be patent. Skull and upper cervical spine: Cervical spine reported separately. Calvarium bone marrow signal is within normal limits. Sinuses/Orbits: Negative orbits. Trace paranasal sinus mucosal thickening. Other: Mastoids are clear. Visible internal auditory structures appear normal. Scalp and face soft tissues appear negative. MRI CERVICAL SPINE FINDINGS Alignment: Mild straightening of cervical lordosis. No spondylolisthesis. Vertebrae: Abnormal marrow signal from the C3 vertebra into the visible upper thoracic spine, although little abnormal enhancement and marrow edema in the cervical vertebrae. A 12 millimeter T1 and T2 heterogeneous lesion in the anterior C3 body most resembles a benign hemangioma. There is a small enhancing metastasis in the superior right T1 body.  There is subtotal T3 vertebral body replacement by enhancing tumor. Cord: Spinal cord signal is within normal limits at all visualized levels. Posterior Fossa, vertebral arteries, paraspinal tissues: Preserved major vascular flow voids in the neck. No cervical lymphadenopathy identified. Disc levels: No cervical spine epidural or extraosseous tumor identified. However, there does appear to be early ventral epidural space extension of tumor at the left T3 body. There is  superimposed cervical spine degeneration which results in spinal stenosis with mild spinal cord mass effect at C4-C5, C5-C6, and C6-C7. No associated cord signal abnormality. IMPRESSION: 1. 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 Approximately 22 individual brain metastases ranging from punctate to 26 mm diameter, including an unusual trans-falcine lesion with some associated microhemorrhage and dural involvement. 2. Confluent cerebral vasogenic edema with no midline shift or loss of basilar cisterns. 3. Abnormal marrow signal throughout much of the cervical spine suspicious for widespread bone metastases although enhancing metastases are only confirmed in the visible upper thoracic spine (including with early ventral epidural spread at T3). 4. Superimposed cervical spine degeneration with degenerative spinal stenosis at C4-C5 through C6-C7 with degenerative spinal cord mass effect but no associated cord signal abnormality. Electronically Signed   By: Genevie Ann M.D.   On: 04/28/2019 03:06   Ct Abdomen Pelvis W Contrast  Result Date: 04/28/2019 CLINICAL DATA:  Brain metastasis with unknown primary. EXAM: CT CHEST, ABDOMEN, AND PELVIS WITH CONTRAST TECHNIQUE: Multidetector CT imaging of the chest, abdomen and pelvis was performed following the standard protocol during bolus administration of intravenous contrast. CONTRAST:  100 cc Omnipaque 300 IV COMPARISON:  No prior chest or abdominal imaging. FINDINGS: CT CHEST FINDINGS Cardiovascular:  Thoracic aorta is normal in caliber. Trace atherosclerosis of the aortic arch. Small amount pericardial fluid in the superior pericardial recess. Small coronary artery calcifications. Heart is normal in size. Mediastinum/Nodes: Probable 12 mm right hilar node, contiguous with right upper lobe perihilar mass. No enlarged mediastinal lymph nodes. No left hilar adenopathy. No supraclavicular adenopathy. Visualized thyroid gland is normal. Decompressed esophagus. Lungs/Pleura: Perihilar right upper lobe 5.3 x 4.6 x 3.7 cm pulmonary mass has irregular margins and surrounding ground-glass opacity. This causes mass effect and narrowing of the right upper lobe bronchus. Mass abuts the minor fissure with fissural thickening extending laterally. Mass abuts the major fissure with localized mass effect slight nodular protrusion a possible extension into the lower lobe, for example series 7, image 48. Biapical emphysema with bulla at the left lung apex. No additional pulmonary mass or nodule. Subpleural atelectasis in the dependent left lower lobe. Musculoskeletal: Bone marrow diffusely heterogeneous in density. Lytic lesion with posterior left T3 vertebral body with posterior cortex involvement and extension to the spinal canal. Lytic lesion involving posterior right T1 vertebral body extending into the pedicle. T8 vertebral body lesion may represent a hemangioma but is nonspecific. Lytic lesion within T10 suspicious for metastatic disease. CT ABDOMEN PELVIS FINDINGS Hepatobiliary: Vague 8 mm low-density lesion in the left lobe of the liver, series 3, image 57. Focal fatty infiltration adjacent to the falciform ligament. Gallbladder is unremarkable. No biliary dilatation. Pancreas: Unremarkable. No pancreatic ductal dilatation or surrounding inflammatory changes. No evidence of pancreatic mass. Spleen: Normal in size without focal abnormality. Adrenals/Urinary Tract: No adrenal nodule. Left greater than right pelvicaliectasis  and prominence of the ureters, likely related to bladder distension. No ureteral stones or cause of obstruction. Urinary bladder is distended without wall thickening. Homogeneous renal enhancement with symmetric excretion on delayed phase imaging. No evidence of focal renal mass. Stomach/Bowel: Stomach is within normal limits. Appendix appears normal. No evidence of bowel wall thickening, distention, or inflammatory changes. No evidence of colonic or small bowel mass. Vascular/Lymphatic: Aorto bi-iliac atherosclerosis. No aneurysm. Patent portal vein. No retroperitoneal adenopathy. No mesenteric or upper abdominal adenopathy. 7 mm right external iliac node is nonspecific. No enlarged inguinal nodes. Reproductive: Heterogeneous enlarged prostate gland spanning 5 cm. This causes mild  mass effect on the bladder base. Other: No ascites, free air, or omental nodularity. Small fat containing umbilical hernia. Musculoskeletal: Bone marrow diffusely heterogeneous. Lytic lesion within L4 vertebral body with mild pathologic compression fracture. Lesion extends into the right pedicle. Lytic lesion within anterior L5 vertebral body. Lytic lesion within the right superior pubic ramus extending to the puboacetabular junction. Suspected small central lytic lesion in the sacrum. Suspected small lytic lesion in the left anterior iliac bone and left iliac crest. IMPRESSION: 1. Right upper lobe perihilar mass measuring 5.3 x 4.6 x 3.7 cm consistent with primary bronchogenic malignancy. Mass abuts the major and minor minor fissure with possible extension into the lower lobe. 2. Probable 12 mm right hilar node, contiguous with the right upper lobe perihilar mass. 3. Osseous metastatic disease with multiple lytic lesions in the thoracic and lumbar spine, pelvis and sacrum. 4. Vague 8 mm low-density lesion in the left lobe of the liver, nonspecific in the setting. 5. Enlarged prostate gland causing mild mass effect on the bladder base. 6.  Urinary bladder and bilateral renal collecting system distention may be due to mass effect from enlarged prostate gland. Aortic Atherosclerosis (ICD10-I70.0) and Emphysema (ICD10-J43.9). Electronically Signed   By: Keith Rake M.D.   On: 04/28/2019 03:42   Ct Head Code Stroke Wo Contrast  Result Date: 04/27/2019 CLINICAL DATA:  Code stroke.  Right-sided weakness. EXAM: CT HEAD WITHOUT CONTRAST TECHNIQUE: Contiguous axial images were obtained from the base of the skull through the vertex without intravenous contrast. COMPARISON:  None. FINDINGS: Brain: Rounded low-density masses posteriorly in both frontal lobes near the vertex each measure 1.7 cm, and there is a 2.5 cm low-density mass more anteriorly in the left frontal lobe. There is extensive vasogenic edema in the left greater than right frontal lobes with regional sulcal effacement and slight mass effect on the lateral ventricles without midline shift. A small area of edema is noted in the right occipital lobe without a presumed underlying lesion visible on this unenhanced study. No acute cortically based infarct, intracranial hemorrhage, or extra-axial fluid collection is identified. Vascular: No hyperdense vessel. Skull: No fracture or focal osseous lesion. Sinuses/Orbits: Visualized paranasal sinuses and mastoid air cells are clear. Orbits are unremarkable. Other: None. ASPECTS [facility] Stroke Program Early CT Score) Not scored due to the presence of multiple cerebral masses. IMPRESSION: 1. 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 Brain MRI without and with contrast is recommended for further evaluation. 2. No midline shift or intracranial hemorrhage. These results were called by telephone at the time of interpretation on 04/27/2019 at 6:45 pm to Dr. Ezequiel Essex, who verbally acknowledged these results. Electronically Signed   By: Logan Bores M.D.   On: 04/27/2019 18:47    Review of  Systems  Neurological: Positive for focal weakness and headaches.   Blood pressure 135/78, pulse 84, temperature 97.7 F (36.5 C), temperature source Oral, resp. rate 14, height 6' (1.829 m), weight 66.7 kg, SpO2 97 %. Physical Exam  Constitutional: He is oriented to person, place, and time.  Neurological: He is alert and oriented to person, place, and time. He has normal strength. GCS eye subscore is 4. GCS verbal subscore is 5. GCS motor subscore is 6.  Patient is awake and alert pupils are equal cranial nerves are intact strength is 5-5 upper and lower extremities with only maybe a slight right pronator drift    Assessment/Plan: 58 year old gentleman with new diagnosis of what  looks like widely metastatic lung cancer with lung mass intra-abdominal mets bony mets and multiple brain mets.  Do not recommend neurosurgical intervention.  Recommend radiation consult oncology consult patient will probably need whole brain radiation probably too many mets to consider stereotactic radiosurgery but will defer to radiation oncology upon their evaluation.  Continue Decadron I do think the patient did not have a stroke as much as had seizure activity related to the meds.  So continue Keppra.  No neurosurgical intervention needed at this point reconsult as needed if her eye doctor wants to pursue anything different other than whole brain radiation  Brandon Ferguson P 04/28/2019, 8:05 AM

## 2019-04-28 NOTE — Progress Notes (Signed)
PROGRESS NOTE    Brandon Ferguson  RDE:[id number] DOB: 11/14/1960 DOA: 04/27/2019 PCP: Patient, No Pcp Per    Brief Narrative:  58 year old gentleman with no significant medical problems, 45-pack-year smoking presented to the emergency room with right hand weakness that started suddenly while he was working.  Retrospectively, he did complain about feeling episodic numbness on his left arm and right arm at least since 1 month.  He has not been to doctors, no health maintenance. Smokes 1 pack/day since 58 year old Drinks about 4 beers per day. Denies any nausea vomiting hematemesis hemoptysis or weight loss.  No shortness of breath. In the emergency room, stroke alert was called.  CT head was consistent with metastatic lesions, transferred to [facility] for multidisciplinary treatment.   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 Patient does have multiple brain mets with edema probably causing partial seizure. Seen by neurology, not a surgical candidate, dexamethasone and Keppra to continue.  Seizure precautions. Called and discussed with radiation oncology, they will evaluate the patient for whole brain radiation. Called and discussed with pulmonology for bronc and biopsy, scheduled for tomorrow.  Smoker: Counseled to quit.  He is motivated.  Will provide nicotine patch.  Alcohol use: Patient drinks about 4 beers every day.  Currently with no evidence of withdrawal.  Will start patient on Ativan as needed.  Start multivitamins and thiamine.  Currently not needing any CIWA protocol.   DVT prophylaxis: SCDs Code Status: Full code Family Communication: None Disposition Plan: Pending clinical diagnosis and treatment plan.  He stayed in the hospital   Consultants:   Pulmonary  Neurosurgery  Radiation oncology  Procedures:     None  Antimicrobials:   None   Subjective: Patient seen and examined.  History reviewed.  He denies any complaints.  Was very sad about the diagnosis.  Denies any shortness of breath.  Objective: Vitals:   04/28/19 0335 04/28/19 0500 04/28/19 0738 04/28/19 1211  BP: (!) 145/79  135/78 (!) 143/83  Pulse: 86  84 (!) 104  Resp: 14  14 20   Temp: 97.8 F (36.6 C)  97.7 F (36.5 C) 98.3 F (36.8 C)  TempSrc: Oral  Oral Oral  SpO2: 100%  97% 100%  Weight:  66.7 kg    Height:        Intake/Output Summary (Last 24 hours) at 04/28/2019 1441 Last data filed at 04/28/2019 0926 Gross per 24 hour  Intake 826 ml  Output 2175 ml  Net -1349 ml   Filed Weights   04/27/19 1847 04/28/19 0500  Weight: 74.8 kg 66.7 kg    Examination:  General exam: Appears calm and comfortable, chronically sick looking.  On room air. Respiratory system: Clear to auscultation. Respiratory effort normal. Cardiovascular system: S1 & S2 heard, RRR. No JVD, murmurs, rubs, gallops or clicks. No pedal edema. Gastrointestinal system: Abdomen is nondistended, soft and nontender. No organomegaly or masses felt. Normal bowel sounds heard. Central nervous system: Alert and oriented. No focal neurological deficits. Extremities: Symmetric 5 x 5 power. Skin: No rashes, lesions or ulcers Psychiatry: Judgement and insight appear normal. Mood & affect appropriate.     Data Reviewed: I have personally reviewed following labs and imaging studies  CBC: Recent Labs  Lab 04/27/19 1835  WBC 9.9  NEUTROABS 5.7  HGB 11.3*  HCT 35.6*  MCV 92.2  PLT 428*  Basic Metabolic Panel: Recent Labs  Lab 04/27/19 1835  NA 135  K 3.4*  CL 102  CO2 24  GLUCOSE 153*  BUN 14  CREATININE 0.77  CALCIUM 9.4   GFR: Estimated Creatinine Clearance: 93.8 mL/min (by C-G formula based on SCr of 0.77 mg/dL). Liver Function Tests: Recent Labs  Lab 04/27/19 1835  AST 20  ALT 14  ALKPHOS 100  BILITOT 0.6  PROT 8.4*   ALBUMIN 3.2*   No results for input(s): LIPASE, AMYLASE in the last 168 hours. No results for input(s): AMMONIA in the last 168 hours. Coagulation Profile: Recent Labs  Lab 04/27/19 1835  INR 1.2   Cardiac Enzymes: No results for input(s): CKTOTAL, CKMB, CKMBINDEX, TROPONINI in the last 168 hours. BNP (last 3 results) No results for input(s): PROBNP in the last 8760 hours. HbA1C: No results for input(s): HGBA1C in the last 72 hours. CBG: No results for input(s): GLUCAP in the last 168 hours. Lipid Profile: No results for input(s): CHOL, HDL, LDLCALC, TRIG, CHOLHDL, LDLDIRECT in the last 72 hours. Thyroid Function Tests: No results for input(s): TSH, T4TOTAL, FREET4, T3FREE, THYROIDAB in the last 72 hours. Anemia Panel: No results for input(s): VITAMINB12, FOLATE, FERRITIN, TIBC, IRON, RETICCTPCT in the last 72 hours. Sepsis Labs: No results for input(s): PROCALCITON, LATICACIDVEN in the last 168 hours.  Recent Results (from the past 240 hour(s))  MRSA PCR Screening     Status: None   Collection Time: 04/28/19 12:50 AM   Specimen: Nasal Mucosa; Nasopharyngeal  Result Value Ref Range Status   MRSA by PCR NEGATIVE NEGATIVE Final    Comment:        The GeneXpert MRSA Assay (FDA approved for NASAL specimens only), is one component of a comprehensive MRSA colonization surveillance program. It is not intended to diagnose MRSA infection nor to guide or monitor treatment for MRSA infections. Performed at [facility], [place] [address], [place], [place] [address]          Radiology Studies: Ct Chest W Contrast  Result Date: 04/28/2019 CLINICAL DATA:  Brain metastasis with unknown primary. EXAM: CT CHEST, ABDOMEN, AND PELVIS WITH CONTRAST TECHNIQUE: Multidetector CT imaging of the chest, abdomen and pelvis was performed following the standard protocol during bolus administration of intravenous contrast. CONTRAST:  100 cc Omnipaque 300 IV COMPARISON:  No prior chest  or abdominal imaging. FINDINGS: CT CHEST FINDINGS Cardiovascular: Thoracic aorta is normal in caliber. Trace atherosclerosis of the aortic arch. Small amount pericardial fluid in the superior pericardial recess. Small coronary artery calcifications. Heart is normal in size. Mediastinum/Nodes: Probable 12 mm right hilar node, contiguous with right upper lobe perihilar mass. No enlarged mediastinal lymph nodes. No left hilar adenopathy. No supraclavicular adenopathy. Visualized thyroid gland is normal. Decompressed esophagus. Lungs/Pleura: Perihilar right upper lobe 5.3 x 4.6 x 3.7 cm pulmonary mass has irregular margins and surrounding ground-glass opacity. This causes mass effect and narrowing of the right upper lobe bronchus. Mass abuts the minor fissure with fissural thickening extending laterally. Mass abuts the major fissure with localized mass effect slight nodular protrusion a possible extension into the lower lobe, for example series 7, image 48. Biapical emphysema with bulla at the left lung apex. No additional pulmonary mass or nodule. Subpleural atelectasis in the dependent left lower lobe. Musculoskeletal: Bone marrow diffusely heterogeneous in density. Lytic lesion with posterior left T3 vertebral body with posterior cortex involvement and extension to the spinal canal. Lytic lesion involving posterior right T1 vertebral body extending  into the pedicle. T8 vertebral body lesion may represent a hemangioma but is nonspecific. Lytic lesion within T10 suspicious for metastatic disease. CT ABDOMEN PELVIS FINDINGS Hepatobiliary: Vague 8 mm low-density lesion in the left lobe of the liver, series 3, image 57. Focal fatty infiltration adjacent to the falciform ligament. Gallbladder is unremarkable. No biliary dilatation. Pancreas: Unremarkable. No pancreatic ductal dilatation or surrounding inflammatory changes. No evidence of pancreatic mass. Spleen: Normal in size without focal abnormality. Adrenals/Urinary  Tract: No adrenal nodule. Left greater than right pelvicaliectasis and prominence of the ureters, likely related to bladder distension. No ureteral stones or cause of obstruction. Urinary bladder is distended without wall thickening. Homogeneous renal enhancement with symmetric excretion on delayed phase imaging. No evidence of focal renal mass. Stomach/Bowel: Stomach is within normal limits. Appendix appears normal. No evidence of bowel wall thickening, distention, or inflammatory changes. No evidence of colonic or small bowel mass. Vascular/Lymphatic: Aorto bi-iliac atherosclerosis. No aneurysm. Patent portal vein. No retroperitoneal adenopathy. No mesenteric or upper abdominal adenopathy. 7 mm right external iliac node is nonspecific. No enlarged inguinal nodes. Reproductive: Heterogeneous enlarged prostate gland spanning 5 cm. This causes mild mass effect on the bladder base. Other: No ascites, free air, or omental nodularity. Small fat containing umbilical hernia. Musculoskeletal: Bone marrow diffusely heterogeneous. Lytic lesion within L4 vertebral body with mild pathologic compression fracture. Lesion extends into the right pedicle. Lytic lesion within anterior L5 vertebral body. Lytic lesion within the right superior pubic ramus extending to the puboacetabular junction. Suspected small central lytic lesion in the sacrum. Suspected small lytic lesion in the left anterior iliac bone and left iliac crest. IMPRESSION: 1. Right upper lobe perihilar mass measuring 5.3 x 4.6 x 3.7 cm consistent with primary bronchogenic malignancy. Mass abuts the major and minor minor fissure with possible extension into the lower lobe. 2. Probable 12 mm right hilar node, contiguous with the right upper lobe perihilar mass. 3. Osseous metastatic disease with multiple lytic lesions in the thoracic and lumbar spine, pelvis and sacrum. 4. Vague 8 mm low-density lesion in the left lobe of the liver, nonspecific in the setting. 5.  Enlarged prostate gland causing mild mass effect on the bladder base. 6. Urinary bladder and bilateral renal collecting system distention may be due to mass effect from enlarged prostate gland. Aortic Atherosclerosis (ICD10-I70.0) and Emphysema (ICD10-J43.9). Electronically Signed   By: Keith Rake M.D.   On: 04/28/2019 03:42   Mr Jeri Cos ZH Contrast  Result Date: 04/28/2019 CLINICAL DATA:  58 year old male with code stroke presentation, plain head CT suggesting metastatic disease to the brain. EXAM: MRI HEAD WITHOUT AND WITH CONTRAST MRI CERVICAL SPINE WITHOUT AND WITH CONTRAST TECHNIQUE: Multiplanar, multiecho pulse sequences of the brain and surrounding structures, and cervical spine, to include the craniocervical junction and cervicothoracic junction, were obtained without and with intravenous contrast. CONTRAST:  7.11mL GADAVIST GADOBUTROL 1 MMOL/ML IV SOLN COMPARISON:  Head CT 04/27/2019. FINDINGS: MRI HEAD FINDINGS Brain: Multiple enhancing brain metastases ranging from punctate to 26 millimeters diameter. Approximately 22 individual metastases are identified, with 1 or 2 additional questionable appearing areas (small left choroid plexus versus subependymal metastasis on series 38, image 28). There is an unusual trans-falcine lobulated and enhancing mass measuring about 23 millimeters (series 39, image 13) with microhemorrhage and an adjacent more cystic 12 centimeter metastasis. Associated mild regional dural thickening. All of the other lesions larger than 1 centimeter are cystic and rim enhancing. No other dural involvement or dural thickening identified. No leptomeningeal  tumor identified. Confluent vasogenic edema in both frontal and parietal lobes as seen by CT earlier today. Regional mass effect with no midline shift or loss of basilar cisterns. Mass effect on both lateral ventricles with no ventriculomegaly or transependymal edema. No superimposed restricted diffusion suggestive of acute  infarction. No acute intracranial hemorrhage. Cervicomedullary junction and pituitary are within normal limits. Vascular: Major intracranial vascular flow voids are preserved. The major dural venous sinuses are enhancing and appear to be patent. Skull and upper cervical spine: Cervical spine reported separately. Calvarium bone marrow signal is within normal limits. Sinuses/Orbits: Negative orbits. Trace paranasal sinus mucosal thickening. Other: Mastoids are clear. Visible internal auditory structures appear normal. Scalp and face soft tissues appear negative. MRI CERVICAL SPINE FINDINGS Alignment: Mild straightening of cervical lordosis. No spondylolisthesis. Vertebrae: Abnormal marrow signal from the C3 vertebra into the visible upper thoracic spine, although little abnormal enhancement and marrow edema in the cervical vertebrae. A 12 millimeter T1 and T2 heterogeneous lesion in the anterior C3 body most resembles a benign hemangioma. There is a small enhancing metastasis in the superior right T1 body. There is subtotal T3 vertebral body replacement by enhancing tumor. Cord: Spinal cord signal is within normal limits at all visualized levels. Posterior Fossa, vertebral arteries, paraspinal tissues: Preserved major vascular flow voids in the neck. No cervical lymphadenopathy identified. Disc levels: No cervical spine epidural or extraosseous tumor identified. However, there does appear to be early ventral epidural space extension of tumor at the left T3 body. There is superimposed cervical spine degeneration which results in spinal stenosis with mild spinal cord mass effect at C4-C5, C5-C6, and C6-C7. No associated cord signal abnormality. IMPRESSION: 1. 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 Approximately 22 individual brain metastases ranging from punctate to 26 mm diameter, including an unusual trans-falcine lesion with some associated microhemorrhage and dural involvement. 2. Confluent cerebral vasogenic  edema with no midline shift or loss of basilar cisterns. 3. Abnormal marrow signal throughout much of the cervical spine suspicious for widespread bone metastases although enhancing metastases are only confirmed in the visible upper thoracic spine (including with early ventral epidural spread at T3). 4. Superimposed cervical spine degeneration with degenerative spinal stenosis at C4-C5 through C6-C7 with degenerative spinal cord mass effect but no associated cord signal abnormality. Electronically Signed   By: Genevie Ann M.D.   On: 04/28/2019 03:06   Mr Cervical Spine W Wo Contrast  Result Date: 04/28/2019 CLINICAL DATA:  58 year old male with code stroke presentation, plain head CT suggesting metastatic disease to the brain. EXAM: MRI HEAD WITHOUT AND WITH CONTRAST MRI CERVICAL SPINE WITHOUT AND WITH CONTRAST TECHNIQUE: Multiplanar, multiecho pulse sequences of the brain and surrounding structures, and cervical spine, to include the craniocervical junction and cervicothoracic junction, were obtained without and with intravenous contrast. CONTRAST:  7.3mL GADAVIST GADOBUTROL 1 MMOL/ML IV SOLN COMPARISON:  Head CT 04/27/2019. FINDINGS: MRI HEAD FINDINGS Brain: Multiple enhancing brain metastases ranging from punctate to 26 millimeters diameter. Approximately 22 individual metastases are identified, with 1 or 2 additional questionable appearing areas (small left choroid plexus versus subependymal metastasis on series 38, image 28). There is an unusual trans-falcine lobulated and enhancing mass measuring about 23 millimeters (series 39, image 13) with microhemorrhage and an adjacent more cystic 12 centimeter metastasis. Associated mild regional dural thickening. All of the other lesions larger than 1 centimeter are cystic and rim enhancing. No other dural involvement or dural thickening identified. No leptomeningeal tumor identified. Confluent vasogenic edema in both frontal  and parietal lobes as seen by CT earlier  today. Regional mass effect with no midline shift or loss of basilar cisterns. Mass effect on both lateral ventricles with no ventriculomegaly or transependymal edema. No superimposed restricted diffusion suggestive of acute infarction. No acute intracranial hemorrhage. Cervicomedullary junction and pituitary are within normal limits. Vascular: Major intracranial vascular flow voids are preserved. The major dural venous sinuses are enhancing and appear to be patent. Skull and upper cervical spine: Cervical spine reported separately. Calvarium bone marrow signal is within normal limits. Sinuses/Orbits: Negative orbits. Trace paranasal sinus mucosal thickening. Other: Mastoids are clear. Visible internal auditory structures appear normal. Scalp and face soft tissues appear negative. MRI CERVICAL SPINE FINDINGS Alignment: Mild straightening of cervical lordosis. No spondylolisthesis. Vertebrae: Abnormal marrow signal from the C3 vertebra into the visible upper thoracic spine, although little abnormal enhancement and marrow edema in the cervical vertebrae. A 12 millimeter T1 and T2 heterogeneous lesion in the anterior C3 body most resembles a benign hemangioma. There is a small enhancing metastasis in the superior right T1 body. There is subtotal T3 vertebral body replacement by enhancing tumor. Cord: Spinal cord signal is within normal limits at all visualized levels. Posterior Fossa, vertebral arteries, paraspinal tissues: Preserved major vascular flow voids in the neck. No cervical lymphadenopathy identified. Disc levels: No cervical spine epidural or extraosseous tumor identified. However, there does appear to be early ventral epidural space extension of tumor at the left T3 body. There is superimposed cervical spine degeneration which results in spinal stenosis with mild spinal cord mass effect at C4-C5, C5-C6, and C6-C7. No associated cord signal abnormality. IMPRESSION: 1. Widespread metastatic disease to the  brain. Approximately 22 individual brain metastases ranging from punctate to 26 mm diameter, including an unusual trans-falcine lesion with some associated microhemorrhage and dural involvement. 2. Confluent cerebral vasogenic edema with no midline shift or loss of basilar cisterns. 3. Abnormal marrow signal throughout much of the cervical spine suspicious for widespread bone metastases although enhancing metastases are only confirmed in the visible upper thoracic spine (including with early ventral epidural spread at T3). 4. Superimposed cervical spine degeneration with degenerative spinal stenosis at C4-C5 through C6-C7 with degenerative spinal cord mass effect but no associated cord signal abnormality. Electronically Signed   By: Genevie Ann M.D.   On: 04/28/2019 03:06   Ct Abdomen Pelvis W Contrast  Result Date: 04/28/2019 CLINICAL DATA:  Brain metastasis with unknown primary. EXAM: CT CHEST, ABDOMEN, AND PELVIS WITH CONTRAST TECHNIQUE: Multidetector CT imaging of the chest, abdomen and pelvis was performed following the standard protocol during bolus administration of intravenous contrast. CONTRAST:  100 cc Omnipaque 300 IV COMPARISON:  No prior chest or abdominal imaging. FINDINGS: CT CHEST FINDINGS Cardiovascular: Thoracic aorta is normal in caliber. Trace atherosclerosis of the aortic arch. Small amount pericardial fluid in the superior pericardial recess. Small coronary artery calcifications. Heart is normal in size. Mediastinum/Nodes: Probable 12 mm right hilar node, contiguous with right upper lobe perihilar mass. No enlarged mediastinal lymph nodes. No left hilar adenopathy. No supraclavicular adenopathy. Visualized thyroid gland is normal. Decompressed esophagus. Lungs/Pleura: Perihilar right upper lobe 5.3 x 4.6 x 3.7 cm pulmonary mass has irregular margins and surrounding ground-glass opacity. This causes mass effect and narrowing of the right upper lobe bronchus. Mass abuts the minor fissure with  fissural thickening extending laterally. Mass abuts the major fissure with localized mass effect slight nodular protrusion a possible extension into the lower lobe, for example series 7, image 48. Biapical  emphysema with bulla at the left lung apex. No additional pulmonary mass or nodule. Subpleural atelectasis in the dependent left lower lobe. Musculoskeletal: Bone marrow diffusely heterogeneous in density. Lytic lesion with posterior left T3 vertebral body with posterior cortex involvement and extension to the spinal canal. Lytic lesion involving posterior right T1 vertebral body extending into the pedicle. T8 vertebral body lesion may represent a hemangioma but is nonspecific. Lytic lesion within T10 suspicious for metastatic disease. CT ABDOMEN PELVIS FINDINGS Hepatobiliary: Vague 8 mm low-density lesion in the left lobe of the liver, series 3, image 57. Focal fatty infiltration adjacent to the falciform ligament. Gallbladder is unremarkable. No biliary dilatation. Pancreas: Unremarkable. No pancreatic ductal dilatation or surrounding inflammatory changes. No evidence of pancreatic mass. Spleen: Normal in size without focal abnormality. Adrenals/Urinary Tract: No adrenal nodule. Left greater than right pelvicaliectasis and prominence of the ureters, likely related to bladder distension. No ureteral stones or cause of obstruction. Urinary bladder is distended without wall thickening. Homogeneous renal enhancement with symmetric excretion on delayed phase imaging. No evidence of focal renal mass. Stomach/Bowel: Stomach is within normal limits. Appendix appears normal. No evidence of bowel wall thickening, distention, or inflammatory changes. No evidence of colonic or small bowel mass. Vascular/Lymphatic: Aorto bi-iliac atherosclerosis. No aneurysm. Patent portal vein. No retroperitoneal adenopathy. No mesenteric or upper abdominal adenopathy. 7 mm right external iliac node is nonspecific. No enlarged inguinal  nodes. Reproductive: Heterogeneous enlarged prostate gland spanning 5 cm. This causes mild mass effect on the bladder base. Other: No ascites, free air, or omental nodularity. Small fat containing umbilical hernia. Musculoskeletal: Bone marrow diffusely heterogeneous. Lytic lesion within L4 vertebral body with mild pathologic compression fracture. Lesion extends into the right pedicle. Lytic lesion within anterior L5 vertebral body. Lytic lesion within the right superior pubic ramus extending to the puboacetabular junction. Suspected small central lytic lesion in the sacrum. Suspected small lytic lesion in the left anterior iliac bone and left iliac crest. IMPRESSION: 1. Right upper lobe perihilar mass measuring 5.3 x 4.6 x 3.7 cm consistent with primary bronchogenic malignancy. Mass abuts the major and minor minor fissure with possible extension into the lower lobe. 2. Probable 12 mm right hilar node, contiguous with the right upper lobe perihilar mass. 3. Osseous metastatic disease with multiple lytic lesions in the thoracic and lumbar spine, pelvis and sacrum. 4. Vague 8 mm low-density lesion in the left lobe of the liver, nonspecific in the setting. 5. Enlarged prostate gland causing mild mass effect on the bladder base. 6. Urinary bladder and bilateral renal collecting system distention may be due to mass effect from enlarged prostate gland. Aortic Atherosclerosis (ICD10-I70.0) and Emphysema (ICD10-J43.9). Electronically Signed   By: Keith Rake M.D.   On: 04/28/2019 03:42   Ct Head Code Stroke Wo Contrast  Result Date: 04/27/2019 CLINICAL DATA:  Code stroke.  Right-sided weakness. EXAM: CT HEAD WITHOUT CONTRAST TECHNIQUE: Contiguous axial images were obtained from the base of the skull through the vertex without intravenous contrast. COMPARISON:  None. FINDINGS: Brain: Rounded low-density masses posteriorly in both frontal lobes near the vertex each measure 1.7 cm, and there is a 2.5 cm low-density  mass more anteriorly in the left frontal lobe. There is extensive vasogenic edema in the left greater than right frontal lobes with regional sulcal effacement and slight mass effect on the lateral ventricles without midline shift. A small area of edema is noted in the right occipital lobe without a presumed underlying lesion visible on this unenhanced study.  No acute cortically based infarct, intracranial hemorrhage, or extra-axial fluid collection is identified. Vascular: No hyperdense vessel. Skull: No fracture or focal osseous lesion. Sinuses/Orbits: Visualized paranasal sinuses and mastoid air cells are clear. Orbits are unremarkable. Other: None. ASPECTS [facility] Stroke Program Early CT Score) Not scored due to the presence of multiple cerebral masses. IMPRESSION: 1. 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 Brain MRI without and with contrast is recommended for further evaluation. 2. No midline shift or intracranial hemorrhage. These results were called by telephone at the time of interpretation on 04/27/2019 at 6:45 pm to Dr. Ezequiel Essex, who verbally acknowledged these results. Electronically Signed   By: Logan Bores M.D.   On: 04/27/2019 18:47        Scheduled Meds:  dexamethasone (DECADRON) injection  4 mg Intravenous Q4H   nicotine  21 mg Transdermal Daily   polyethylene glycol  17 g Oral Daily   sodium chloride flush  3 mL Intravenous Q12H   Continuous Infusions:  sodium chloride 250 mL (04/28/19 0923)   levETIRAcetam 500 mg (04/28/19 0930)     LOS: 1 day    Time spent: 35 minutes    Barb Merino, MD [facility] Pager [phone]

## 2019-04-28 NOTE — Consult Note (Signed)
Name: Brandon Ferguson MRN: [id number] DOB: Mar 31, 1961    ADMISSION DATE:  04/27/2019 CONSULTATION DATE: 04/28/2019  REFERRING MD : [facility]  CHIEF COMPLAINT: Right-sided weakness incidental finding of brain mets and lung cancer  BRIEF PATIENT DESCRIPTION: 58 year old male in no acute distress  SIGNIFICANT EVENTS  Right-sided weakness 04/27/2019  STUDIES:  As noted below   HISTORY OF PRESENT ILLNESS: 59 year old male who has had 2 episodes of right-sided weakness in the last month.  The last when he was admitted to [facility] and underwent a code stroke with CT of the head which had an incidental finding metastatic brain mets.  CT of the chest revealed multiple findings consistent with bronchogenic primary cancer.  Is also noted to have lytic lesions of the bone and brain.  He is a 1 pack a day smoker since age 38.  Has any other exposures.  Pulmonary critical care asked to evaluate for possible fiberoptic bronchoscopy tissue biopsy.  Already evaluated by neurosurgery with no possibility of surgical intervention.  He will need radiation and medical oncology consults.  PAST MEDICAL HISTORY :   has a past medical history of Iron deficiency and Vitamin D deficiency.  has a past surgical history that includes Knee surgery. Prior to Admission medications   Medication Sig Start Date End Date Taking? Authorizing Provider  acetaminophen (TYLENOL) 500 MG tablet Take 500 mg by mouth every 6 (six) hours as needed for headache.   Yes [provider]  ferrous sulfate 325 (65 FE) MG EC tablet Take 325 mg by mouth daily.   Yes [provider]  meloxicam (MOBIC) 15 MG tablet Take 15 mg by mouth daily. 04/25/19  Yes [provider]  naproxen sodium (ALEVE) 220 MG tablet Take 220 mg by mouth 2 (two) times daily as needed (for headache).   Yes [provider]   No Known Allergies  FAMILY HISTORY:  family history includes Alcohol abuse in  his father; Breast cancer in his mother; Cirrhosis in his father. SOCIAL HISTORY:  reports that he has been smoking cigarettes. He has been smoking about 1.00 pack per day. He has never used smokeless tobacco. He reports current alcohol use of about 6.0 standard drinks of alcohol per week. He reports current drug use. Drug: Marijuana.  REVIEW OF SYSTEMS:   10 point review of system taken, please see HPI for positives and negatives.   SUBJECTIVE:  58 year old male no acute distress to be evaluated for fiberoptic bronchoscopy lung mass with widely metastatic cancer presumed  VITAL SIGNS: Temp:  [97.7 F (36.5 C)-99.5 F (37.5 C)] 97.7 F (36.5 C) (11/19 0738) Pulse Rate:  [69-100] 84 (11/19 0738) Resp:  [14-23] 14 (11/19 0738) BP: (135-166)/(77-95) 135/78 (11/19 0738) SpO2:  [95 %-100 %] 97 % (11/19 0738) Weight:  [66.7 kg-74.8 kg] 66.7 kg (11/19 0500)  PHYSICAL EXAMINATION: General: Thin black male in no acute distress Neuro: Appears to have some right-sided weakness HEENT: No JVD lymphadenopathy is appreciated Cardiovascular: Heart sounds are regular regular rate and rhythm Lungs: Coarse rhonchi bilaterally Abdomen: Soft nontender positive bowel sounds Musculoskeletal: Grossly intact Skin: Warm and dry  Recent Labs  Lab 04/27/19 1835  NA 135  K 3.4*  CL 102  CO2 24  BUN 14  CREATININE 0.77  GLUCOSE 153*   Recent Labs  Lab 04/27/19 1835  HGB 11.3*  HCT 35.6*  WBC 9.9  PLT 428*   Ct Chest W Contrast  Result Date: 04/28/2019 CLINICAL DATA:  Brain  metastasis with unknown primary. EXAM: CT CHEST, ABDOMEN, AND PELVIS WITH CONTRAST TECHNIQUE: Multidetector CT imaging of the chest, abdomen and pelvis was performed following the standard protocol during bolus administration of intravenous contrast. CONTRAST:  100 cc Omnipaque 300 IV COMPARISON:  No prior chest or abdominal imaging. FINDINGS: CT CHEST FINDINGS Cardiovascular: Thoracic aorta is normal in caliber. Trace  atherosclerosis of the aortic arch. Small amount pericardial fluid in the superior pericardial recess. Small coronary artery calcifications. Heart is normal in size. Mediastinum/Nodes: Probable 12 mm right hilar node, contiguous with right upper lobe perihilar mass. No enlarged mediastinal lymph nodes. No left hilar adenopathy. No supraclavicular adenopathy. Visualized thyroid gland is normal. Decompressed esophagus. Lungs/Pleura: Perihilar right upper lobe 5.3 x 4.6 x 3.7 cm pulmonary mass has irregular margins and surrounding ground-glass opacity. This causes mass effect and narrowing of the right upper lobe bronchus. Mass abuts the Jerame Hedding fissure with fissural thickening extending laterally. Mass abuts the major fissure with localized mass effect slight nodular protrusion a possible extension into the lower lobe, for example series 7, image 48. Biapical emphysema with bulla at the left lung apex. No additional pulmonary mass or nodule. Subpleural atelectasis in the dependent left lower lobe. Musculoskeletal: Bone marrow diffusely heterogeneous in density. Lytic lesion with posterior left T3 vertebral body with posterior cortex involvement and extension to the spinal canal. Lytic lesion involving posterior right T1 vertebral body extending into the pedicle. T8 vertebral body lesion may represent a hemangioma but is nonspecific. Lytic lesion within T10 suspicious for metastatic disease. CT ABDOMEN PELVIS FINDINGS Hepatobiliary: Vague 8 mm low-density lesion in the left lobe of the liver, series 3, image 57. Focal fatty infiltration adjacent to the falciform ligament. Gallbladder is unremarkable. No biliary dilatation. Pancreas: Unremarkable. No pancreatic ductal dilatation or surrounding inflammatory changes. No evidence of pancreatic mass. Spleen: Normal in size without focal abnormality. Adrenals/Urinary Tract: No adrenal nodule. Left greater than right pelvicaliectasis and prominence of the ureters, likely  related to bladder distension. No ureteral stones or cause of obstruction. Urinary bladder is distended without wall thickening. Homogeneous renal enhancement with symmetric excretion on delayed phase imaging. No evidence of focal renal mass. Stomach/Bowel: Stomach is within normal limits. Appendix appears normal. No evidence of bowel wall thickening, distention, or inflammatory changes. No evidence of colonic or small bowel mass. Vascular/Lymphatic: Aorto bi-iliac atherosclerosis. No aneurysm. Patent portal vein. No retroperitoneal adenopathy. No mesenteric or upper abdominal adenopathy. 7 mm right external iliac node is nonspecific. No enlarged inguinal nodes. Reproductive: Heterogeneous enlarged prostate gland spanning 5 cm. This causes mild mass effect on the bladder base. Other: No ascites, free air, or omental nodularity. Small fat containing umbilical hernia. Musculoskeletal: Bone marrow diffusely heterogeneous. Lytic lesion within L4 vertebral body with mild pathologic compression fracture. Lesion extends into the right pedicle. Lytic lesion within anterior L5 vertebral body. Lytic lesion within the right superior pubic ramus extending to the puboacetabular junction. Suspected small central lytic lesion in the sacrum. Suspected small lytic lesion in the left anterior iliac bone and left iliac crest. IMPRESSION: 1. Right upper lobe perihilar mass measuring 5.3 x 4.6 x 3.7 cm consistent with primary bronchogenic malignancy. Mass abuts the major and Javontay Vandam Ji Fairburn fissure with possible extension into the lower lobe. 2. Probable 12 mm right hilar node, contiguous with the right upper lobe perihilar mass. 3. Osseous metastatic disease with multiple lytic lesions in the thoracic and lumbar spine, pelvis and sacrum. 4. Vague 8 mm low-density lesion in the left lobe of  the liver, nonspecific in the setting. 5. Enlarged prostate gland causing mild mass effect on the bladder base. 6. Urinary bladder and bilateral renal  collecting system distention may be due to mass effect from enlarged prostate gland. Aortic Atherosclerosis (ICD10-I70.0) and Emphysema (ICD10-J43.9). Electronically Signed   By: Keith Rake M.D.   On: 04/28/2019 03:42   Mr Jeri Cos GX Contrast  Result Date: 04/28/2019 CLINICAL DATA:  58 year old male with code stroke presentation, plain head CT suggesting metastatic disease to the brain. EXAM: MRI HEAD WITHOUT AND WITH CONTRAST MRI CERVICAL SPINE WITHOUT AND WITH CONTRAST TECHNIQUE: Multiplanar, multiecho pulse sequences of the brain and surrounding structures, and cervical spine, to include the craniocervical junction and cervicothoracic junction, were obtained without and with intravenous contrast. CONTRAST:  7.40mL GADAVIST GADOBUTROL 1 MMOL/ML IV SOLN COMPARISON:  Head CT 04/27/2019. FINDINGS: MRI HEAD FINDINGS Brain: Multiple enhancing brain metastases ranging from punctate to 26 millimeters diameter. Approximately 22 individual metastases are identified, with 1 or 2 additional questionable appearing areas (small left choroid plexus versus subependymal metastasis on series 38, image 28). There is an unusual trans-falcine lobulated and enhancing mass measuring about 23 millimeters (series 39, image 13) with microhemorrhage and an adjacent more cystic 12 centimeter metastasis. Associated mild regional dural thickening. All of the other lesions larger than 1 centimeter are cystic and rim enhancing. No other dural involvement or dural thickening identified. No leptomeningeal tumor identified. Confluent vasogenic edema in both frontal and parietal lobes as seen by CT earlier today. Regional mass effect with no midline shift or loss of basilar cisterns. Mass effect on both lateral ventricles with no ventriculomegaly or transependymal edema. No superimposed restricted diffusion suggestive of acute infarction. No acute intracranial hemorrhage. Cervicomedullary junction and pituitary are within normal limits.  Vascular: Major intracranial vascular flow voids are preserved. The major dural venous sinuses are enhancing and appear to be patent. Skull and upper cervical spine: Cervical spine reported separately. Calvarium bone marrow signal is within normal limits. Sinuses/Orbits: Negative orbits. Trace paranasal sinus mucosal thickening. Other: Mastoids are clear. Visible internal auditory structures appear normal. Scalp and face soft tissues appear negative. MRI CERVICAL SPINE FINDINGS Alignment: Mild straightening of cervical lordosis. No spondylolisthesis. Vertebrae: Abnormal marrow signal from the C3 vertebra into the visible upper thoracic spine, although little abnormal enhancement and marrow edema in the cervical vertebrae. A 12 millimeter T1 and T2 heterogeneous lesion in the anterior C3 body most resembles a benign hemangioma. There is a small enhancing metastasis in the superior right T1 body. There is subtotal T3 vertebral body replacement by enhancing tumor. Cord: Spinal cord signal is within normal limits at all visualized levels. Posterior Fossa, vertebral arteries, paraspinal tissues: Preserved major vascular flow voids in the neck. No cervical lymphadenopathy identified. Disc levels: No cervical spine epidural or extraosseous tumor identified. However, there does appear to be early ventral epidural space extension of tumor at the left T3 body. There is superimposed cervical spine degeneration which results in spinal stenosis with mild spinal cord mass effect at C4-C5, C5-C6, and C6-C7. No associated cord signal abnormality. IMPRESSION: 1. 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 Approximately 22 individual brain metastases ranging from punctate to 26 mm diameter, including an unusual trans-falcine lesion with some associated microhemorrhage and dural involvement. 2. Confluent cerebral vasogenic edema with no midline shift or loss of basilar cisterns. 3. Abnormal marrow signal throughout much of the  cervical spine suspicious for widespread bone metastases although enhancing metastases are only confirmed in the visible upper  thoracic spine (including with early ventral epidural spread at T3). 4. Superimposed cervical spine degeneration with degenerative spinal stenosis at C4-C5 through C6-C7 with degenerative spinal cord mass effect but no associated cord signal abnormality. Electronically Signed   By: Genevie Ann M.D.   On: 04/28/2019 03:06   Mr Cervical Spine W Wo Contrast  Result Date: 04/28/2019 CLINICAL DATA:  59 year old male with code stroke presentation, plain head CT suggesting metastatic disease to the brain. EXAM: MRI HEAD WITHOUT AND WITH CONTRAST MRI CERVICAL SPINE WITHOUT AND WITH CONTRAST TECHNIQUE: Multiplanar, multiecho pulse sequences of the brain and surrounding structures, and cervical spine, to include the craniocervical junction and cervicothoracic junction, were obtained without and with intravenous contrast. CONTRAST:  7.96mL GADAVIST GADOBUTROL 1 MMOL/ML IV SOLN COMPARISON:  Head CT 04/27/2019. FINDINGS: MRI HEAD FINDINGS Brain: Multiple enhancing brain metastases ranging from punctate to 26 millimeters diameter. Approximately 22 individual metastases are identified, with 1 or 2 additional questionable appearing areas (small left choroid plexus versus subependymal metastasis on series 38, image 28). There is an unusual trans-falcine lobulated and enhancing mass measuring about 23 millimeters (series 39, image 13) with microhemorrhage and an adjacent more cystic 12 centimeter metastasis. Associated mild regional dural thickening. All of the other lesions larger than 1 centimeter are cystic and rim enhancing. No other dural involvement or dural thickening identified. No leptomeningeal tumor identified. Confluent vasogenic edema in both frontal and parietal lobes as seen by CT earlier today. Regional mass effect with no midline shift or loss of basilar cisterns. Mass effect on both lateral  ventricles with no ventriculomegaly or transependymal edema. No superimposed restricted diffusion suggestive of acute infarction. No acute intracranial hemorrhage. Cervicomedullary junction and pituitary are within normal limits. Vascular: Major intracranial vascular flow voids are preserved. The major dural venous sinuses are enhancing and appear to be patent. Skull and upper cervical spine: Cervical spine reported separately. Calvarium bone marrow signal is within normal limits. Sinuses/Orbits: Negative orbits. Trace paranasal sinus mucosal thickening. Other: Mastoids are clear. Visible internal auditory structures appear normal. Scalp and face soft tissues appear negative. MRI CERVICAL SPINE FINDINGS Alignment: Mild straightening of cervical lordosis. No spondylolisthesis. Vertebrae: Abnormal marrow signal from the C3 vertebra into the visible upper thoracic spine, although little abnormal enhancement and marrow edema in the cervical vertebrae. A 12 millimeter T1 and T2 heterogeneous lesion in the anterior C3 body most resembles a benign hemangioma. There is a small enhancing metastasis in the superior right T1 body. There is subtotal T3 vertebral body replacement by enhancing tumor. Cord: Spinal cord signal is within normal limits at all visualized levels. Posterior Fossa, vertebral arteries, paraspinal tissues: Preserved major vascular flow voids in the neck. No cervical lymphadenopathy identified. Disc levels: No cervical spine epidural or extraosseous tumor identified. However, there does appear to be early ventral epidural space extension of tumor at the left T3 body. There is superimposed cervical spine degeneration which results in spinal stenosis with mild spinal cord mass effect at C4-C5, C5-C6, and C6-C7. No associated cord signal abnormality. IMPRESSION: 1. 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 Approximately 22 individual brain metastases ranging from punctate to 26 mm diameter, including an  unusual trans-falcine lesion with some associated microhemorrhage and dural involvement. 2. Confluent cerebral vasogenic edema with no midline shift or loss of basilar cisterns. 3. Abnormal marrow signal throughout much of the cervical spine suspicious for widespread bone metastases although enhancing metastases are only confirmed in the visible upper thoracic spine (including with early ventral epidural spread  at T3). 4. Superimposed cervical spine degeneration with degenerative spinal stenosis at C4-C5 through C6-C7 with degenerative spinal cord mass effect but no associated cord signal abnormality. Electronically Signed   By: Genevie Ann M.D.   On: 04/28/2019 03:06   Ct Abdomen Pelvis W Contrast  Result Date: 04/28/2019 CLINICAL DATA:  Brain metastasis with unknown primary. EXAM: CT CHEST, ABDOMEN, AND PELVIS WITH CONTRAST TECHNIQUE: Multidetector CT imaging of the chest, abdomen and pelvis was performed following the standard protocol during bolus administration of intravenous contrast. CONTRAST:  100 cc Omnipaque 300 IV COMPARISON:  No prior chest or abdominal imaging. FINDINGS: CT CHEST FINDINGS Cardiovascular: Thoracic aorta is normal in caliber. Trace atherosclerosis of the aortic arch. Small amount pericardial fluid in the superior pericardial recess. Small coronary artery calcifications. Heart is normal in size. Mediastinum/Nodes: Probable 12 mm right hilar node, contiguous with right upper lobe perihilar mass. No enlarged mediastinal lymph nodes. No left hilar adenopathy. No supraclavicular adenopathy. Visualized thyroid gland is normal. Decompressed esophagus. Lungs/Pleura: Perihilar right upper lobe 5.3 x 4.6 x 3.7 cm pulmonary mass has irregular margins and surrounding ground-glass opacity. This causes mass effect and narrowing of the right upper lobe bronchus. Mass abuts the Ester Mabe fissure with fissural thickening extending laterally. Mass abuts the major fissure with localized mass effect slight  nodular protrusion a possible extension into the lower lobe, for example series 7, image 48. Biapical emphysema with bulla at the left lung apex. No additional pulmonary mass or nodule. Subpleural atelectasis in the dependent left lower lobe. Musculoskeletal: Bone marrow diffusely heterogeneous in density. Lytic lesion with posterior left T3 vertebral body with posterior cortex involvement and extension to the spinal canal. Lytic lesion involving posterior right T1 vertebral body extending into the pedicle. T8 vertebral body lesion may represent a hemangioma but is nonspecific. Lytic lesion within T10 suspicious for metastatic disease. CT ABDOMEN PELVIS FINDINGS Hepatobiliary: Vague 8 mm low-density lesion in the left lobe of the liver, series 3, image 57. Focal fatty infiltration adjacent to the falciform ligament. Gallbladder is unremarkable. No biliary dilatation. Pancreas: Unremarkable. No pancreatic ductal dilatation or surrounding inflammatory changes. No evidence of pancreatic mass. Spleen: Normal in size without focal abnormality. Adrenals/Urinary Tract: No adrenal nodule. Left greater than right pelvicaliectasis and prominence of the ureters, likely related to bladder distension. No ureteral stones or cause of obstruction. Urinary bladder is distended without wall thickening. Homogeneous renal enhancement with symmetric excretion on delayed phase imaging. No evidence of focal renal mass. Stomach/Bowel: Stomach is within normal limits. Appendix appears normal. No evidence of bowel wall thickening, distention, or inflammatory changes. No evidence of colonic or small bowel mass. Vascular/Lymphatic: Aorto bi-iliac atherosclerosis. No aneurysm. Patent portal vein. No retroperitoneal adenopathy. No mesenteric or upper abdominal adenopathy. 7 mm right external iliac node is nonspecific. No enlarged inguinal nodes. Reproductive: Heterogeneous enlarged prostate gland spanning 5 cm. This causes mild mass effect on  the bladder base. Other: No ascites, free air, or omental nodularity. Small fat containing umbilical hernia. Musculoskeletal: Bone marrow diffusely heterogeneous. Lytic lesion within L4 vertebral body with mild pathologic compression fracture. Lesion extends into the right pedicle. Lytic lesion within anterior L5 vertebral body. Lytic lesion within the right superior pubic ramus extending to the puboacetabular junction. Suspected small central lytic lesion in the sacrum. Suspected small lytic lesion in the left anterior iliac bone and left iliac crest. IMPRESSION: 1. Right upper lobe perihilar mass measuring 5.3 x 4.6 x 3.7 cm consistent with primary bronchogenic malignancy. Mass abuts  the major and Marai Teehan Kaspian Muccio fissure with possible extension into the lower lobe. 2. Probable 12 mm right hilar node, contiguous with the right upper lobe perihilar mass. 3. Osseous metastatic disease with multiple lytic lesions in the thoracic and lumbar spine, pelvis and sacrum. 4. Vague 8 mm low-density lesion in the left lobe of the liver, nonspecific in the setting. 5. Enlarged prostate gland causing mild mass effect on the bladder base. 6. Urinary bladder and bilateral renal collecting system distention may be due to mass effect from enlarged prostate gland. Aortic Atherosclerosis (ICD10-I70.0) and Emphysema (ICD10-J43.9). Electronically Signed   By: Keith Rake M.D.   On: 04/28/2019 03:42   Ct Head Code Stroke Wo Contrast  Result Date: 04/27/2019 CLINICAL DATA:  Code stroke.  Right-sided weakness. EXAM: CT HEAD WITHOUT CONTRAST TECHNIQUE: Contiguous axial images were obtained from the base of the skull through the vertex without intravenous contrast. COMPARISON:  None. FINDINGS: Brain: Rounded low-density masses posteriorly in both frontal lobes near the vertex each measure 1.7 cm, and there is a 2.5 cm low-density mass more anteriorly in the left frontal lobe. There is extensive vasogenic edema in the left greater than  right frontal lobes with regional sulcal effacement and slight mass effect on the lateral ventricles without midline shift. A small area of edema is noted in the right occipital lobe without a presumed underlying lesion visible on this unenhanced study. No acute cortically based infarct, intracranial hemorrhage, or extra-axial fluid collection is identified. Vascular: No hyperdense vessel. Skull: No fracture or focal osseous lesion. Sinuses/Orbits: Visualized paranasal sinuses and mastoid air cells are clear. Orbits are unremarkable. Other: None. ASPECTS [facility] Stroke Program Early CT Score) Not scored due to the presence of multiple cerebral masses. IMPRESSION: 1. 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 Brain MRI without and with contrast is recommended for further evaluation. 2. No midline shift or intracranial hemorrhage. These results were called by telephone at the time of interpretation on 04/27/2019 at 6:45 pm to Dr. Ezequiel Essex, who verbally acknowledged these results. Electronically Signed   By: Logan Bores M.D.   On: 04/27/2019 18:47    ASSESSMENT 04/28/2019 consult for metastatic cancer of the brain, bone and lung. 11/19/ 2020 CT of the chest shows a perihilar mass measuring 5.3 x 4.6 x 3.7 cm consistent with bronchogenic malignancy.  The mass abuts the major Eloy Fehl fissure with possible extension into to the lower lobe.  Probable 12 mm right hilar node.  Osseous metastatic disease multiple lytic lesions in the thoracic lumbar spine pelvis and sacrum.  Vague 8 mm low-density lesion left lower lobe of the liver 02/26/2019 MRI of the brain shows widespread metastatic disease to the brain.  Approximately 20 to individual brain metastases.  This been evaluated by neurosurgery with no surgical interventions able.     PLAN:  Patient is agreeable to fiberoptic bronchoscopy with transbronchial biopsy for tissue diagnosis. We will try to  plan this for tomorrow per Dr. Beckie Salts. He will need medical and radiation oncology consult. Smoking cessation Follow-up with outpatient pulmonary Note at the time of this dictation COVID-19 testing was still pending. Appropriate protection was worn in the room by this examiner.   Richardson Landry Ransom Nickson ACNP [name]

## 2019-04-28 NOTE — Progress Notes (Signed)
Chaplain went to visit patient but patient was eating lunch.  The chaplain looked to return later but is awaiting the patients COVID test results.  Brion Aliment Chaplain Resident For questions concerning this note please contact me by pager [phone]

## 2019-04-29 ENCOUNTER — Encounter (HOSPITAL_COMMUNITY)
Admission: EM | Disposition: A | Discharge status: Home Health | Payer: Self-pay | Source: Home / Self Care | Attending: Internal Medicine

## 2019-04-29 ENCOUNTER — Ambulatory Visit
Admit: 2019-04-29 | Discharge: 2019-04-29 | Disposition: A | Discharge status: Home / Self Care | Payer: Medicaid Other | Attending: Radiation Oncology | Admitting: Radiation Oncology

## 2019-04-29 ENCOUNTER — Inpatient Hospital Stay (HOSPITAL_COMMUNITY): Payer: Medicaid Other

## 2019-04-29 DIAGNOSIS — C7951 Secondary malignant neoplasm of bone: Secondary | ICD-10-CM

## 2019-04-29 DIAGNOSIS — C7931 Secondary malignant neoplasm of brain: Secondary | ICD-10-CM

## 2019-04-29 DIAGNOSIS — C3411 Malignant neoplasm of upper lobe, right bronchus or lung: Secondary | ICD-10-CM

## 2019-04-29 HISTORY — PX: VIDEO BRONCHOSCOPY: SHX5072

## 2019-04-29 SURGERY — BRONCHOSCOPY, WITH FLUOROSCOPY
Anesthesia: Moderate Sedation | Laterality: Bilateral

## 2019-04-29 MED ORDER — LIDOCAINE HCL URETHRAL/MUCOSAL 2 % EX GEL: 1.0000 "application " | Freq: Once | CUTANEOUS | Status: DC

## 2019-04-29 MED ORDER — FENTANYL CITRATE (PF) 100 MCG/2ML IJ SOLN
INTRAMUSCULAR | Status: DC | PRN
  Administered 2019-04-29 (×2): 100 ug via INTRAVENOUS

## 2019-04-29 MED ORDER — PHENYLEPHRINE HCL 0.25 % NA SOLN: 1.0000 | Freq: Four times a day (QID) | NASAL | Status: DC | PRN

## 2019-04-29 MED ORDER — FENTANYL CITRATE (PF) 100 MCG/2ML IJ SOLN
INTRAMUSCULAR | Status: AC
  Filled 2019-04-29: qty 4

## 2019-04-29 MED ORDER — SODIUM CHLORIDE 0.9 % IV SOLN
Freq: Once | INTRAVENOUS | Status: AC
  Administered 2019-04-29: 08:00:00 via INTRAVENOUS

## 2019-04-29 MED ORDER — EPINEPHRINE 1 MG/10ML IJ SOSY
PREFILLED_SYRINGE | INTRAMUSCULAR | Status: DC | PRN
  Administered 2019-04-29: 1 mg

## 2019-04-29 MED ORDER — MIDAZOLAM HCL (PF) 5 MG/ML IJ SOLN
INTRAMUSCULAR | Status: AC
  Filled 2019-04-29: qty 2

## 2019-04-29 MED ORDER — SODIUM CHLORIDE 0.9 % IV SOLN: Freq: Once | INTRAVENOUS | Status: DC

## 2019-04-29 MED ORDER — LIDOCAINE HCL (PF) 1 % IJ SOLN
INTRAMUSCULAR | Status: DC | PRN
  Administered 2019-04-29: 6 mL

## 2019-04-29 MED ORDER — LIDOCAINE HCL URETHRAL/MUCOSAL 2 % EX GEL
CUTANEOUS | Status: DC | PRN
  Administered 2019-04-29: 1

## 2019-04-29 MED ORDER — DEXAMETHASONE SODIUM PHOSPHATE 4 MG/ML IJ SOLN
4.0000 mg | INTRAMUSCULAR | Status: DC
  Administered 2019-04-29 – 2019-05-01 (×13): 4 mg via INTRAVENOUS
  Filled 2019-04-29 (×14): qty 1

## 2019-04-29 MED ORDER — MIDAZOLAM HCL (PF) 10 MG/2ML IJ SOLN
INTRAMUSCULAR | Status: DC | PRN
  Administered 2019-04-29: 2 mg via INTRAVENOUS
  Administered 2019-04-29: 1 mg via INTRAVENOUS
  Administered 2019-04-29: 2 mg via INTRAVENOUS

## 2019-04-29 MED ORDER — PHENYLEPHRINE HCL 0.25 % NA SOLN
NASAL | Status: DC | PRN
  Administered 2019-04-29: 2 via NASAL

## 2019-04-29 NOTE — Procedures (Signed)
Bronchoscopy Procedure Note Brandon Ferguson [id number] 11-09-1960  Procedure: Bronchoscopy Indications: Obtain specimens for culture and/or other diagnostic studies  Procedure Details Consent: Risks of procedure as well as the alternatives and risks of each were explained to the (patient/caregiver).  Consent for procedure obtained. Time Out: Verified patient identification, verified procedure, site/side was marked, verified correct patient position, special equipment/implants available, medications/allergies/relevent history reviewed, required imaging and test results available.  Performed  In preparation for procedure, patient was given 100% FiO2, bronchoscope lubricated and lidocaine given via ETT (10 ml). Sedation: Benzodiazepines and fentanyl  Airway entered and the following bronchi were examined: RUL, RML, RLL, LUL, LLL and Bronchi.   Procedures performed: Brushings performed Bronchoscope removed.    Evaluation Hemodynamic Status: BP stable throughout; O2 sats: stable throughout Patient's Current Condition: stable Specimens:  Tissue biopsy,brushing and needle aspirate RUL Complications: No apparent complications Patient did tolerate procedure well.   Brandon Ferguson Brandon Ferguson 04/29/2019

## 2019-04-29 NOTE — Op Note (Signed)
PCCM Video Bronchoscopy Procedure Note  The patient was informed of the risks (including but not limited to bleeding, infection, respiratory failure, lung injury, tooth/oral injury) and benefits of the procedure and gave consent, see chart.  Indication: Right lung mass  Post Procedure Diagnosis: Right lung mass  Location: Right upper lobe lung mass  Condition pre procedure: Stable hemodynamics  Medications for procedure: 100 mcg of fentanyl, 4 mg of Versed.  During the procedure received another 100 mcg of fentanyl and 1 mg of Versed  Procedure description: The bronchoscope was introduced through the left nasal passage and passed to the bilateral lungs to the level of the subsegmental bronchi throughout the tracheobronchial tree.  Airway exam revealed nasopharynx was normal, cord noted to be mobile with no lesions, no endotracheal lesions noted, Carina noted to be sharp. Left lung was examined left upper lobe, left lower lobe bronchi noted to be free of lesions.  Bronchoscope withdrawn back to the carina introduced into the right lung-right upper lobe noted to be free of lesions, orifice to the right upper lobe apicoposterior segment was obliterated by an endobronchial lesion.  Right middle lobe and right lower lobe bronchi were well visualized noted to be free of lesions.  Procedures performed:  needle aspirations performed  Biopsy of endobronchial lesions x6 Brushing of endobronchial lesion x1  Specimens sent: Needle aspiration x3 attempts, tissue biopsy, brushing  Condition post procedure: Stable hemodynamics  EBL: 10 cc  Complications: No complications, tolerated procedure well

## 2019-04-29 NOTE — Progress Notes (Signed)
0800 Pt transferred to Endo for procedure, bronchoscopy.  1029 Pt transferred to [facility]. Report called to [place].

## 2019-04-29 NOTE — Progress Notes (Signed)
Followed up with continued spiritual care from Raylee's transfer from [facility]. No family at bedside. Rommel was awake and talking. He mentioned some concern about his health and stated he was not sure what is going on with his health. He did mention that he has a couple of masses in his body,one in his head, but does not know what they are. Abdirahman said that he was planning to go to treatment and talked about losing his hair. He said that his brother has the similar health issue in his head and it was cancerous.  Also, he said hes not in any pain and he feels ok. However he said that he remembers the last time he was in the hospital and fears needles because of his trauma when he was in his teens. He said that he was cutting down a tree and cut his leg with a chainsaw. I was present to hear his concerns and offered words of encouragement, and ministry of presence.  Zachery requested that if available that his visitor list would be his sister(s), and his girlfriend. I told him to let his Nurse know so she/he could note it. I also told him that there are restrictions with visitation and to ask his care giver for current policy.   Chaplain Resident Fidel Levy [phone]

## 2019-04-29 NOTE — Progress Notes (Signed)
PROGRESS NOTE    Brandon Ferguson  ACZ:[id number] DOB: 1961/03/26 DOA: 04/27/2019 PCP: Patient, No Pcp Per    Brief Narrative:  58 year old gentleman with no significant medical problems, 45-pack-year smoking presented to the emergency room with right hand weakness that started suddenly while he was working.  Retrospectively, he did complain about feeling episodic numbness on his left arm and right arm at least since 1 month.  He has not been to doctors, no health maintenance. Smokes 1 pack/day since 58 year old Drinks about 4 beers per day. Denies any nausea vomiting hematemesis hemoptysis or weight loss.  No shortness of breath. In the emergency room, stroke alert was called.  CT head was consistent with metastatic lesions, transferred to [facility] for multidisciplinary treatment.   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 Pulmonary consulted, underwent bronc today with biopsy of endobronchial lesion on the right upper lobe.  Results pending. Seen by neurosurgery, not a surgical candidate.  To continue dexamethasone and Keppra.  Seizure precautions. Seen by radiation oncology, transferring to [facility] long hospital today for simulation and possibly starting radiation therapy early next week. Outpatient referral to medical oncology done by radiation oncology.  Smoker: Counseled to quit.  He is motivated.  Will provide nicotine patch.  Alcohol use: Patient drinks about 4 beers every day.  Currently with no evidence of withdrawal.  Ativan as needed. Start multivitamins and thiamine.  Currently not needing any CIWA protocol.   DVT prophylaxis: SCDs Code Status: Full code Family Communication: None Disposition Plan: Pending clinical diagnosis and treatment plan.   Transfer to [facility] long hospital today after bronchoscopy to  start radiation treatment planning.   Consultants:   Pulmonary  Neurosurgery  Radiation oncology  Procedures:   None  Antimicrobials:   None   Subjective: Patient seen and examined.  No overnight events.  Denies any complaints.  No tingling or numbness.  He was going for bronc when I examined him. Objective: Vitals:   04/29/19 0955 04/29/19 1000 04/29/19 1005 04/29/19 1010  BP: (!) 146/72 130/70 127/70 139/77  Pulse: 87 81 80 86  Resp: (!) 21 18 18  (!) 22  Temp:      TempSrc:      SpO2: 99% 98% 99% 98%  Weight:      Height:        Intake/Output Summary (Last 24 hours) at 04/29/2019 1050 Last data filed at 04/29/2019 0328 Gross per 24 hour  Intake 1305.38 ml  Output 1820 ml  Net -514.62 ml   Filed Weights   04/27/19 1847 04/28/19 0500 04/29/19 0413  Weight: 74.8 kg 66.7 kg 68.4 kg    Examination:  General exam: Appears calm and comfortable.  On room air. Respiratory system: Clear to auscultation. Respiratory effort normal. Cardiovascular system: S1 & S2 heard, RRR. No JVD, murmurs, rubs, gallops or clicks. No pedal edema. Gastrointestinal system: Abdomen is nondistended, soft and nontender. No organomegaly or masses felt. Normal bowel sounds heard. Central nervous system: Alert and oriented. No focal neurological deficits. Extremities: Symmetric 5 x 5 power. Skin: No rashes, lesions or ulcers Psychiatry: Judgement and insight appear normal. Mood & affect appropriate.     Data Reviewed: I have personally reviewed following labs and imaging studies  CBC: Recent Labs  Lab 04/27/19 1835  WBC 9.9  NEUTROABS 5.7  HGB 11.3*  HCT 35.6*  MCV  92.2  PLT 144*   Basic Metabolic Panel: Recent Labs  Lab 04/27/19 1835  NA 135  K 3.4*  CL 102  CO2 24  GLUCOSE 153*  BUN 14  CREATININE 0.77  CALCIUM 9.4   GFR: Estimated Creatinine Clearance: 96.2 mL/min (by C-G formula based on SCr of 0.77 mg/dL). Liver Function Tests: Recent Labs  Lab  04/27/19 1835  AST 20  ALT 14  ALKPHOS 100  BILITOT 0.6  PROT 8.4*  ALBUMIN 3.2*   No results for input(s): LIPASE, AMYLASE in the last 168 hours. No results for input(s): AMMONIA in the last 168 hours. Coagulation Profile: Recent Labs  Lab 04/27/19 1835  INR 1.2   Cardiac Enzymes: No results for input(s): CKTOTAL, CKMB, CKMBINDEX, TROPONINI in the last 168 hours. BNP (last 3 results) No results for input(s): PROBNP in the last 8760 hours. HbA1C: No results for input(s): HGBA1C in the last 72 hours. CBG: No results for input(s): GLUCAP in the last 168 hours. Lipid Profile: No results for input(s): CHOL, HDL, LDLCALC, TRIG, CHOLHDL, LDLDIRECT in the last 72 hours. Thyroid Function Tests: No results for input(s): TSH, T4TOTAL, FREET4, T3FREE, THYROIDAB in the last 72 hours. Anemia Panel: No results for input(s): VITAMINB12, FOLATE, FERRITIN, TIBC, IRON, RETICCTPCT in the last 72 hours. Sepsis Labs: No results for input(s): PROCALCITON, LATICACIDVEN in the last 168 hours.  Recent Results (from the past 240 hour(s))  SARS CORONAVIRUS 2 (TAT 6-24 HRS) Nasopharyngeal Nasopharyngeal Swab     Status: None   Collection Time: 04/27/19  6:57 PM   Specimen: Nasopharyngeal Swab  Result Value Ref Range Status   SARS Coronavirus 2 NEGATIVE NEGATIVE Final    Comment: (NOTE) SARS-CoV-2 target nucleic acids are NOT DETECTED. The SARS-CoV-2 RNA is generally detectable in upper and lower respiratory specimens during the acute phase of infection. Negative results do not preclude SARS-CoV-2 infection, do not rule out co-infections with other pathogens, and should not be used as the sole basis for treatment or other patient management decisions. Negative results must be combined with clinical observations, patient history, and epidemiological information. The expected result is Negative. Fact Sheet for Patients: SugarRoll.be Fact Sheet for Healthcare  Providers: https://www.woods-mathews.com/ This test is not yet approved or cleared by the [place] FDA and  has been authorized for detection and/or diagnosis of SARS-CoV-2 by FDA under an Emergency Use Authorization (EUA). This EUA will remain  in effect (meaning this test can be used) for the duration of the COVID-19 declaration under Section 56 4(b)(1) of the Act, 21 U.S.C. section 360bbb-3(b)(1), unless the authorization is terminated or revoked sooner. Performed at [facility], [place] [name] [place], [place] [address]   MRSA PCR Screening     Status: None   Collection Time: 04/28/19 12:50 AM   Specimen: Nasal Mucosa; Nasopharyngeal  Result Value Ref Range Status   MRSA by PCR NEGATIVE NEGATIVE Final    Comment:        The GeneXpert MRSA Assay (FDA approved for NASAL specimens only), is one component of a comprehensive MRSA colonization surveillance program. It is not intended to diagnose MRSA infection nor to guide or monitor treatment for MRSA infections. Performed at [facility], [place] [address], [place], [place] [address]          Radiology Studies: Ct Chest W Contrast  Result Date: 04/28/2019 CLINICAL DATA:  Brain metastasis with unknown primary. EXAM: CT CHEST, ABDOMEN, AND PELVIS WITH CONTRAST TECHNIQUE: Multidetector CT imaging of  the chest, abdomen and pelvis was performed following the standard protocol during bolus administration of intravenous contrast. CONTRAST:  100 cc Omnipaque 300 IV COMPARISON:  No prior chest or abdominal imaging. FINDINGS: CT CHEST FINDINGS Cardiovascular: Thoracic aorta is normal in caliber. Trace atherosclerosis of the aortic arch. Small amount pericardial fluid in the superior pericardial recess. Small coronary artery calcifications. Heart is normal in size. Mediastinum/Nodes: Probable 12 mm right hilar node, contiguous with right upper lobe perihilar mass. No enlarged mediastinal lymph nodes. No left  hilar adenopathy. No supraclavicular adenopathy. Visualized thyroid gland is normal. Decompressed esophagus. Lungs/Pleura: Perihilar right upper lobe 5.3 x 4.6 x 3.7 cm pulmonary mass has irregular margins and surrounding ground-glass opacity. This causes mass effect and narrowing of the right upper lobe bronchus. Mass abuts the minor fissure with fissural thickening extending laterally. Mass abuts the major fissure with localized mass effect slight nodular protrusion a possible extension into the lower lobe, for example series 7, image 48. Biapical emphysema with bulla at the left lung apex. No additional pulmonary mass or nodule. Subpleural atelectasis in the dependent left lower lobe. Musculoskeletal: Bone marrow diffusely heterogeneous in density. Lytic lesion with posterior left T3 vertebral body with posterior cortex involvement and extension to the spinal canal. Lytic lesion involving posterior right T1 vertebral body extending into the pedicle. T8 vertebral body lesion may represent a hemangioma but is nonspecific. Lytic lesion within T10 suspicious for metastatic disease. CT ABDOMEN PELVIS FINDINGS Hepatobiliary: Vague 8 mm low-density lesion in the left lobe of the liver, series 3, image 57. Focal fatty infiltration adjacent to the falciform ligament. Gallbladder is unremarkable. No biliary dilatation. Pancreas: Unremarkable. No pancreatic ductal dilatation or surrounding inflammatory changes. No evidence of pancreatic mass. Spleen: Normal in size without focal abnormality. Adrenals/Urinary Tract: No adrenal nodule. Left greater than right pelvicaliectasis and prominence of the ureters, likely related to bladder distension. No ureteral stones or cause of obstruction. Urinary bladder is distended without wall thickening. Homogeneous renal enhancement with symmetric excretion on delayed phase imaging. No evidence of focal renal mass. Stomach/Bowel: Stomach is within normal limits. Appendix appears normal. No  evidence of bowel wall thickening, distention, or inflammatory changes. No evidence of colonic or small bowel mass. Vascular/Lymphatic: Aorto bi-iliac atherosclerosis. No aneurysm. Patent portal vein. No retroperitoneal adenopathy. No mesenteric or upper abdominal adenopathy. 7 mm right external iliac node is nonspecific. No enlarged inguinal nodes. Reproductive: Heterogeneous enlarged prostate gland spanning 5 cm. This causes mild mass effect on the bladder base. Other: No ascites, free air, or omental nodularity. Small fat containing umbilical hernia. Musculoskeletal: Bone marrow diffusely heterogeneous. Lytic lesion within L4 vertebral body with mild pathologic compression fracture. Lesion extends into the right pedicle. Lytic lesion within anterior L5 vertebral body. Lytic lesion within the right superior pubic ramus extending to the puboacetabular junction. Suspected small central lytic lesion in the sacrum. Suspected small lytic lesion in the left anterior iliac bone and left iliac crest. IMPRESSION: 1. Right upper lobe perihilar mass measuring 5.3 x 4.6 x 3.7 cm consistent with primary bronchogenic malignancy. Mass abuts the major and minor minor fissure with possible extension into the lower lobe. 2. Probable 12 mm right hilar node, contiguous with the right upper lobe perihilar mass. 3. Osseous metastatic disease with multiple lytic lesions in the thoracic and lumbar spine, pelvis and sacrum. 4. Vague 8 mm low-density lesion in the left lobe of the liver, nonspecific in the setting. 5. Enlarged prostate gland causing mild mass effect on the bladder  base. 6. Urinary bladder and bilateral renal collecting system distention may be due to mass effect from enlarged prostate gland. Aortic Atherosclerosis (ICD10-I70.0) and Emphysema (ICD10-J43.9). Electronically Signed   By: Keith Rake M.D.   On: 04/28/2019 03:42   Mr Jeri Cos ER Contrast  Result Date: 04/28/2019 CLINICAL DATA:  58 year old male with  code stroke presentation, plain head CT suggesting metastatic disease to the brain. EXAM: MRI HEAD WITHOUT AND WITH CONTRAST MRI CERVICAL SPINE WITHOUT AND WITH CONTRAST TECHNIQUE: Multiplanar, multiecho pulse sequences of the brain and surrounding structures, and cervical spine, to include the craniocervical junction and cervicothoracic junction, were obtained without and with intravenous contrast. CONTRAST:  7.59mL GADAVIST GADOBUTROL 1 MMOL/ML IV SOLN COMPARISON:  Head CT 04/27/2019. FINDINGS: MRI HEAD FINDINGS Brain: Multiple enhancing brain metastases ranging from punctate to 26 millimeters diameter. Approximately 22 individual metastases are identified, with 1 or 2 additional questionable appearing areas (small left choroid plexus versus subependymal metastasis on series 38, image 28). There is an unusual trans-falcine lobulated and enhancing mass measuring about 23 millimeters (series 39, image 13) with microhemorrhage and an adjacent more cystic 12 centimeter metastasis. Associated mild regional dural thickening. All of the other lesions larger than 1 centimeter are cystic and rim enhancing. No other dural involvement or dural thickening identified. No leptomeningeal tumor identified. Confluent vasogenic edema in both frontal and parietal lobes as seen by CT earlier today. Regional mass effect with no midline shift or loss of basilar cisterns. Mass effect on both lateral ventricles with no ventriculomegaly or transependymal edema. No superimposed restricted diffusion suggestive of acute infarction. No acute intracranial hemorrhage. Cervicomedullary junction and pituitary are within normal limits. Vascular: Major intracranial vascular flow voids are preserved. The major dural venous sinuses are enhancing and appear to be patent. Skull and upper cervical spine: Cervical spine reported separately. Calvarium bone marrow signal is within normal limits. Sinuses/Orbits: Negative orbits. Trace paranasal sinus mucosal  thickening. Other: Mastoids are clear. Visible internal auditory structures appear normal. Scalp and face soft tissues appear negative. MRI CERVICAL SPINE FINDINGS Alignment: Mild straightening of cervical lordosis. No spondylolisthesis. Vertebrae: Abnormal marrow signal from the C3 vertebra into the visible upper thoracic spine, although little abnormal enhancement and marrow edema in the cervical vertebrae. A 12 millimeter T1 and T2 heterogeneous lesion in the anterior C3 body most resembles a benign hemangioma. There is a small enhancing metastasis in the superior right T1 body. There is subtotal T3 vertebral body replacement by enhancing tumor. Cord: Spinal cord signal is within normal limits at all visualized levels. Posterior Fossa, vertebral arteries, paraspinal tissues: Preserved major vascular flow voids in the neck. No cervical lymphadenopathy identified. Disc levels: No cervical spine epidural or extraosseous tumor identified. However, there does appear to be early ventral epidural space extension of tumor at the left T3 body. There is superimposed cervical spine degeneration which results in spinal stenosis with mild spinal cord mass effect at C4-C5, C5-C6, and C6-C7. No associated cord signal abnormality. IMPRESSION: 1. 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 Approximately 22 individual brain metastases ranging from punctate to 26 mm diameter, including an unusual trans-falcine lesion with some associated microhemorrhage and dural involvement. 2. Confluent cerebral vasogenic edema with no midline shift or loss of basilar cisterns. 3. Abnormal marrow signal throughout much of the cervical spine suspicious for widespread bone metastases although enhancing metastases are only confirmed in the visible upper thoracic spine (including with early ventral epidural spread at T3). 4. Superimposed cervical spine degeneration with degenerative spinal  stenosis at C4-C5 through C6-C7 with degenerative spinal cord  mass effect but no associated cord signal abnormality. Electronically Signed   By: Genevie Ann M.D.   On: 04/28/2019 03:06   Mr Cervical Spine W Wo Contrast  Result Date: 04/28/2019 CLINICAL DATA:  58 year old male with code stroke presentation, plain head CT suggesting metastatic disease to the brain. EXAM: MRI HEAD WITHOUT AND WITH CONTRAST MRI CERVICAL SPINE WITHOUT AND WITH CONTRAST TECHNIQUE: Multiplanar, multiecho pulse sequences of the brain and surrounding structures, and cervical spine, to include the craniocervical junction and cervicothoracic junction, were obtained without and with intravenous contrast. CONTRAST:  7.18mL GADAVIST GADOBUTROL 1 MMOL/ML IV SOLN COMPARISON:  Head CT 04/27/2019. FINDINGS: MRI HEAD FINDINGS Brain: Multiple enhancing brain metastases ranging from punctate to 26 millimeters diameter. Approximately 22 individual metastases are identified, with 1 or 2 additional questionable appearing areas (small left choroid plexus versus subependymal metastasis on series 38, image 28). There is an unusual trans-falcine lobulated and enhancing mass measuring about 23 millimeters (series 39, image 13) with microhemorrhage and an adjacent more cystic 12 centimeter metastasis. Associated mild regional dural thickening. All of the other lesions larger than 1 centimeter are cystic and rim enhancing. No other dural involvement or dural thickening identified. No leptomeningeal tumor identified. Confluent vasogenic edema in both frontal and parietal lobes as seen by CT earlier today. Regional mass effect with no midline shift or loss of basilar cisterns. Mass effect on both lateral ventricles with no ventriculomegaly or transependymal edema. No superimposed restricted diffusion suggestive of acute infarction. No acute intracranial hemorrhage. Cervicomedullary junction and pituitary are within normal limits. Vascular: Major intracranial vascular flow voids are preserved. The major dural venous sinuses  are enhancing and appear to be patent. Skull and upper cervical spine: Cervical spine reported separately. Calvarium bone marrow signal is within normal limits. Sinuses/Orbits: Negative orbits. Trace paranasal sinus mucosal thickening. Other: Mastoids are clear. Visible internal auditory structures appear normal. Scalp and face soft tissues appear negative. MRI CERVICAL SPINE FINDINGS Alignment: Mild straightening of cervical lordosis. No spondylolisthesis. Vertebrae: Abnormal marrow signal from the C3 vertebra into the visible upper thoracic spine, although little abnormal enhancement and marrow edema in the cervical vertebrae. A 12 millimeter T1 and T2 heterogeneous lesion in the anterior C3 body most resembles a benign hemangioma. There is a small enhancing metastasis in the superior right T1 body. There is subtotal T3 vertebral body replacement by enhancing tumor. Cord: Spinal cord signal is within normal limits at all visualized levels. Posterior Fossa, vertebral arteries, paraspinal tissues: Preserved major vascular flow voids in the neck. No cervical lymphadenopathy identified. Disc levels: No cervical spine epidural or extraosseous tumor identified. However, there does appear to be early ventral epidural space extension of tumor at the left T3 body. There is superimposed cervical spine degeneration which results in spinal stenosis with mild spinal cord mass effect at C4-C5, C5-C6, and C6-C7. No associated cord signal abnormality. IMPRESSION: 1. 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 Approximately 22 individual brain metastases ranging from punctate to 26 mm diameter, including an unusual trans-falcine lesion with some associated microhemorrhage and dural involvement. 2. Confluent cerebral vasogenic edema with no midline shift or loss of basilar cisterns. 3. Abnormal marrow signal throughout much of the cervical spine suspicious for widespread bone metastases although enhancing metastases are only  confirmed in the visible upper thoracic spine (including with early ventral epidural spread at T3). 4. Superimposed cervical spine degeneration with degenerative spinal stenosis at C4-C5 through C6-C7 with degenerative  spinal cord mass effect but no associated cord signal abnormality. Electronically Signed   By: Genevie Ann M.D.   On: 04/28/2019 03:06   Ct Abdomen Pelvis W Contrast  Result Date: 04/28/2019 CLINICAL DATA:  Brain metastasis with unknown primary. EXAM: CT CHEST, ABDOMEN, AND PELVIS WITH CONTRAST TECHNIQUE: Multidetector CT imaging of the chest, abdomen and pelvis was performed following the standard protocol during bolus administration of intravenous contrast. CONTRAST:  100 cc Omnipaque 300 IV COMPARISON:  No prior chest or abdominal imaging. FINDINGS: CT CHEST FINDINGS Cardiovascular: Thoracic aorta is normal in caliber. Trace atherosclerosis of the aortic arch. Small amount pericardial fluid in the superior pericardial recess. Small coronary artery calcifications. Heart is normal in size. Mediastinum/Nodes: Probable 12 mm right hilar node, contiguous with right upper lobe perihilar mass. No enlarged mediastinal lymph nodes. No left hilar adenopathy. No supraclavicular adenopathy. Visualized thyroid gland is normal. Decompressed esophagus. Lungs/Pleura: Perihilar right upper lobe 5.3 x 4.6 x 3.7 cm pulmonary mass has irregular margins and surrounding ground-glass opacity. This causes mass effect and narrowing of the right upper lobe bronchus. Mass abuts the minor fissure with fissural thickening extending laterally. Mass abuts the major fissure with localized mass effect slight nodular protrusion a possible extension into the lower lobe, for example series 7, image 48. Biapical emphysema with bulla at the left lung apex. No additional pulmonary mass or nodule. Subpleural atelectasis in the dependent left lower lobe. Musculoskeletal: Bone marrow diffusely heterogeneous in density. Lytic lesion with  posterior left T3 vertebral body with posterior cortex involvement and extension to the spinal canal. Lytic lesion involving posterior right T1 vertebral body extending into the pedicle. T8 vertebral body lesion may represent a hemangioma but is nonspecific. Lytic lesion within T10 suspicious for metastatic disease. CT ABDOMEN PELVIS FINDINGS Hepatobiliary: Vague 8 mm low-density lesion in the left lobe of the liver, series 3, image 57. Focal fatty infiltration adjacent to the falciform ligament. Gallbladder is unremarkable. No biliary dilatation. Pancreas: Unremarkable. No pancreatic ductal dilatation or surrounding inflammatory changes. No evidence of pancreatic mass. Spleen: Normal in size without focal abnormality. Adrenals/Urinary Tract: No adrenal nodule. Left greater than right pelvicaliectasis and prominence of the ureters, likely related to bladder distension. No ureteral stones or cause of obstruction. Urinary bladder is distended without wall thickening. Homogeneous renal enhancement with symmetric excretion on delayed phase imaging. No evidence of focal renal mass. Stomach/Bowel: Stomach is within normal limits. Appendix appears normal. No evidence of bowel wall thickening, distention, or inflammatory changes. No evidence of colonic or small bowel mass. Vascular/Lymphatic: Aorto bi-iliac atherosclerosis. No aneurysm. Patent portal vein. No retroperitoneal adenopathy. No mesenteric or upper abdominal adenopathy. 7 mm right external iliac node is nonspecific. No enlarged inguinal nodes. Reproductive: Heterogeneous enlarged prostate gland spanning 5 cm. This causes mild mass effect on the bladder base. Other: No ascites, free air, or omental nodularity. Small fat containing umbilical hernia. Musculoskeletal: Bone marrow diffusely heterogeneous. Lytic lesion within L4 vertebral body with mild pathologic compression fracture. Lesion extends into the right pedicle. Lytic lesion within anterior L5 vertebral  body. Lytic lesion within the right superior pubic ramus extending to the puboacetabular junction. Suspected small central lytic lesion in the sacrum. Suspected small lytic lesion in the left anterior iliac bone and left iliac crest. IMPRESSION: 1. Right upper lobe perihilar mass measuring 5.3 x 4.6 x 3.7 cm consistent with primary bronchogenic malignancy. Mass abuts the major and minor minor fissure with possible extension into the lower lobe. 2. Probable 12 mm  right hilar node, contiguous with the right upper lobe perihilar mass. 3. Osseous metastatic disease with multiple lytic lesions in the thoracic and lumbar spine, pelvis and sacrum. 4. Vague 8 mm low-density lesion in the left lobe of the liver, nonspecific in the setting. 5. Enlarged prostate gland causing mild mass effect on the bladder base. 6. Urinary bladder and bilateral renal collecting system distention may be due to mass effect from enlarged prostate gland. Aortic Atherosclerosis (ICD10-I70.0) and Emphysema (ICD10-J43.9). Electronically Signed   By: Keith Rake M.D.   On: 04/28/2019 03:42   Ct Head Code Stroke Wo Contrast  Result Date: 04/27/2019 CLINICAL DATA:  Code stroke.  Right-sided weakness. EXAM: CT HEAD WITHOUT CONTRAST TECHNIQUE: Contiguous axial images were obtained from the base of the skull through the vertex without intravenous contrast. COMPARISON:  None. FINDINGS: Brain: Rounded low-density masses posteriorly in both frontal lobes near the vertex each measure 1.7 cm, and there is a 2.5 cm low-density mass more anteriorly in the left frontal lobe. There is extensive vasogenic edema in the left greater than right frontal lobes with regional sulcal effacement and slight mass effect on the lateral ventricles without midline shift. A small area of edema is noted in the right occipital lobe without a presumed underlying lesion visible on this unenhanced study. No acute cortically based infarct, intracranial hemorrhage, or  extra-axial fluid collection is identified. Vascular: No hyperdense vessel. Skull: No fracture or focal osseous lesion. Sinuses/Orbits: Visualized paranasal sinuses and mastoid air cells are clear. Orbits are unremarkable. Other: None. ASPECTS [facility] Stroke Program Early CT Score) Not scored due to the presence of multiple cerebral masses. IMPRESSION: 1. 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 Brain MRI without and with contrast is recommended for further evaluation. 2. No midline shift or intracranial hemorrhage. These results were called by telephone at the time of interpretation on 04/27/2019 at 6:45 pm to Dr. Ezequiel Essex, who verbally acknowledged these results. Electronically Signed   By: Logan Bores M.D.   On: 04/27/2019 18:47        Scheduled Meds:  dexamethasone (DECADRON) injection  4 mg Intravenous W2N   folic acid  1 mg Oral Daily   lidocaine  1 application Topical Once   nicotine  21 mg Transdermal Daily   polyethylene glycol  17 g Oral Daily   sodium chloride flush  3 mL Intravenous Q12H   thiamine  100 mg Oral Daily   Continuous Infusions:  sodium chloride 250 mL (04/28/19 2228)   sodium chloride     levETIRAcetam 500 mg (04/28/19 2243)     LOS: 2 days    Time spent: 30 minutes    Barb Merino, MD [facility] Pager [phone]

## 2019-04-29 NOTE — Progress Notes (Signed)
Video bronchoscopy performed.  INtervention bronchial brushing. Intevervention bronchial biopsy. INtervention needle aspiration.  No complications noted.  Patient tolerated well.

## 2019-04-30 LAB — BASIC METABOLIC PANEL
Anion gap: 9 (ref 5–15)
BUN: 15 mg/dL (ref 6–20)
CO2: 26 mmol/L (ref 22–32)
Calcium: 9.7 mg/dL (ref 8.9–10.3)
Chloride: 97 mmol/L — ABNORMAL LOW (ref 98–111)
Creatinine, Ser: 0.65 mg/dL (ref 0.61–1.24)
GFR calc Af Amer: >60 mL/min (ref >60)
GFR calc non Af Amer: >60 mL/min (ref >60)
Glucose, Bld: 238 mg/dL — ABNORMAL HIGH (ref 70–99)
Potassium: 4.1 mmol/L (ref 3.5–5.1)
Sodium: 132 mmol/L — ABNORMAL LOW (ref 135–145)

## 2019-04-30 MED ORDER — LEVETIRACETAM 500 MG PO TABS
500.0000 mg | ORAL_TABLET | Freq: Two times a day (BID) | ORAL | Status: DC
  Administered 2019-05-01 – 2019-05-04 (×9): 500 mg via ORAL
  Filled 2019-04-30 (×9): qty 1

## 2019-04-30 MED ORDER — PANTOPRAZOLE SODIUM 40 MG PO TBEC
40.0000 mg | DELAYED_RELEASE_TABLET | Freq: Every day | ORAL | Status: DC
  Administered 2019-04-30 – 2019-05-04 (×5): 40 mg via ORAL
  Filled 2019-04-30 (×5): qty 1

## 2019-04-30 NOTE — Progress Notes (Signed)
PROGRESS NOTE    Brandon Ferguson  GYF:[id number] DOB: December 27, 1960 DOA: 04/27/2019 PCP: Patient, No Pcp Per   Brief Narrative:  Patient is a 58 year old African-American male with no past medical history but smoking for 45 pack years who presented to the emergency department with complaints of right hand weakness that started suddenly when he was working.  He was complaining of feeling episodic numbness on his left arm and right arm for at least a month.  He has not seen a doctor for a while.  He smokes a pack a day since the age of 48.  Drinks 4 beers a day.  In the emergency department, stroke alert was called.  CT head was consistent with metastatic lesions.  MRI imaging showed widespread brain metastasis and cervical vertebral metastatic lesions.  CT chest showed right upper lobe perihilar mass measuring 5.3 x 4.6 x 3.7 cm consistent with primary bronchogenic malignancy.  He underwent bronchogenic biopsy by pulmonary team , report is pending.  He has been transferred to [facility] for starting radiation therapy for brain metastasis.  Currently hemodynamically stable.  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  He underwent bronchoscopy by pulmonology with biopsy of endobronchial lesion on the right upper lobe.  Results pending.  Patient seen by neurosurgery, not a surgical candidate.  Currently on dexamethasone and Keppra.  Continue seizure precaution.  Seen by radiation oncology and he has been transferred to [facility] for simulation and starting radiation therapy this Monday. Outpatient referral to medical oncology will be done by radiation oncology. Currently he is alert and oriented.  Complains of back pain.  Smoker: Nicotine patch continued.  Counseled for cessation  Alcohol abuse: Drinks 4 beers every day.  No evidence of  withdrawal.  Continue thiamine, folic acid.         DVT prophylaxis:SCD Code Status: Full Family Communication:None at the bedside Disposition Plan: Home after full work-up.  Will request PT/OT evaluation.   Consultants: None  Procedures: None  Antimicrobials:  Anti-infectives (From admission, onward)   None      Subjective: Patient seen and examined the bedside this morning.  Medically stable.  Sitting in the chair.  Looks little weak but alert and oriented.  Complains of back pain.  Objective: Vitals:   04/29/19 1608 04/29/19 2023 04/30/19 0015 04/30/19 0404  BP: 135/79 137/87 108/80 132/78  Pulse: 83 83 75 60  Resp: 18  19 17   Temp: 98.1 F (36.7 C) 98.7 F (37.1 C) 98.4 F (36.9 C) 98.2 F (36.8 C)  TempSrc: Oral Oral Oral Oral  SpO2: 97% 99% 99% 99%  Weight:      Height:        Intake/Output Summary (Last 24 hours) at 04/30/2019 1314 Last data filed at 04/30/2019 1150 Gross per 24 hour  Intake 720 ml  Output 1475 ml  Net -755 ml   Filed Weights   04/27/19 1847 04/28/19 0500 04/29/19 0413  Weight: 74.8 kg 66.7 kg 68.4 kg    Examination:  General exam: Appears calm and comfortable ,Not in distress, generalized weakness  HEENT:PERRL,Oral mucosa moist, Ear/Nose normal on gross exam Respiratory system: Bilateral equal air entry, normal vesicular breath sounds, no wheezes or crackles  Cardiovascular system: S1 & S2 heard, RRR. No JVD, murmurs, rubs, gallops or clicks. No pedal edema. Gastrointestinal system: Abdomen  is nondistended, soft and nontender. No organomegaly or masses felt. Normal bowel sounds heard. Central nervous system: Alert and oriented. No focal neurological deficits. Extremities: No edema, no clubbing ,no cyanosis, distal peripheral pulses palpable. Skin: No rashes, lesions or ulcers,no icterus ,no pallor      Data Reviewed: I have personally reviewed following labs and imaging studies  CBC: Recent Labs  Lab 04/27/19 1835   WBC 9.9  NEUTROABS 5.7  HGB 11.3*  HCT 35.6*  MCV 92.2  PLT 716*   Basic Metabolic Panel: Recent Labs  Lab 04/27/19 1835 04/30/19 0912  NA 135 132*  K 3.4* 4.1  CL 102 97*  CO2 24 26  GLUCOSE 153* 238*  BUN 14 15  CREATININE 0.77 0.65  CALCIUM 9.4 9.7   GFR: Estimated Creatinine Clearance: 96.2 mL/min (by C-G formula based on SCr of 0.65 mg/dL). Liver Function Tests: Recent Labs  Lab 04/27/19 1835  AST 20  ALT 14  ALKPHOS 100  BILITOT 0.6  PROT 8.4*  ALBUMIN 3.2*   No results for input(s): LIPASE, AMYLASE in the last 168 hours. No results for input(s): AMMONIA in the last 168 hours. Coagulation Profile: Recent Labs  Lab 04/27/19 1835  INR 1.2   Cardiac Enzymes: No results for input(s): CKTOTAL, CKMB, CKMBINDEX, TROPONINI in the last 168 hours. BNP (last 3 results) No results for input(s): PROBNP in the last 8760 hours. HbA1C: No results for input(s): HGBA1C in the last 72 hours. CBG: No results for input(s): GLUCAP in the last 168 hours. Lipid Profile: No results for input(s): CHOL, HDL, LDLCALC, TRIG, CHOLHDL, LDLDIRECT in the last 72 hours. Thyroid Function Tests: No results for input(s): TSH, T4TOTAL, FREET4, T3FREE, THYROIDAB in the last 72 hours. Anemia Panel: No results for input(s): VITAMINB12, FOLATE, FERRITIN, TIBC, IRON, RETICCTPCT in the last 72 hours. Sepsis Labs: No results for input(s): PROCALCITON, LATICACIDVEN in the last 168 hours.  Recent Results (from the past 240 hour(s))  SARS CORONAVIRUS 2 (TAT 6-24 HRS) Nasopharyngeal Nasopharyngeal Swab     Status: None   Collection Time: 04/27/19  6:57 PM   Specimen: Nasopharyngeal Swab  Result Value Ref Range Status   SARS Coronavirus 2 NEGATIVE NEGATIVE Final    Comment: (NOTE) SARS-CoV-2 target nucleic acids are NOT DETECTED. The SARS-CoV-2 RNA is generally detectable in upper and lower respiratory specimens during the acute phase of infection. Negative results do not preclude  SARS-CoV-2 infection, do not rule out co-infections with other pathogens, and should not be used as the sole basis for treatment or other patient management decisions. Negative results must be combined with clinical observations, patient history, and epidemiological information. The expected result is Negative. Fact Sheet for Patients: SugarRoll.be Fact Sheet for Healthcare Providers: https://www.woods-mathews.com/ This test is not yet approved or cleared by the [place] FDA and  has been authorized for detection and/or diagnosis of SARS-CoV-2 by FDA under an Emergency Use Authorization (EUA). This EUA will remain  in effect (meaning this test can be used) for the duration of the COVID-19 declaration under Section 56 4(b)(1) of the Act, 21 U.S.C. section 360bbb-3(b)(1), unless the authorization is terminated or revoked sooner. Performed at [facility], [place] [address], [place], [place] [address]   MRSA PCR Screening     Status: None   Collection Time: 04/28/19 12:50 AM   Specimen: Nasal Mucosa; Nasopharyngeal  Result Value Ref Range Status   MRSA by PCR NEGATIVE NEGATIVE Final    Comment:  The GeneXpert MRSA Assay (FDA approved for NASAL specimens only), is one component of a comprehensive MRSA colonization surveillance program. It is not intended to diagnose MRSA infection nor to guide or monitor treatment for MRSA infections. Performed at [facility], [place] [address] [place],  [address]          Radiology Studies: No results found.      Scheduled Meds: . dexamethasone (DECADRON) injection  4 mg Intravenous F3V  . folic acid  1 mg Oral Daily  . nicotine  21 mg Transdermal Daily  . polyethylene glycol  17 g Oral Daily  . sodium chloride flush  3 mL Intravenous Q12H  . thiamine  100 mg Oral Daily   Continuous Infusions: . sodium chloride 250 mL (04/29/19 2318)  . levETIRAcetam 500 mg  (04/30/19 0952)     LOS: 3 days    Time spent: 25 mins.More than 50% of that time was spent in counseling and/or coordination of care.      Shelly Coss, MD [facility] Pager [phone]  If 7PM-7AM, please contact night-coverage [email or web address] Password [email or web address] 04/30/2019, 1:14 PM

## 2019-05-01 ENCOUNTER — Encounter (HOSPITAL_COMMUNITY): Payer: Self-pay | Admitting: Pulmonary Disease

## 2019-05-01 LAB — CBC WITH DIFFERENTIAL/PLATELET
Abs Immature Granulocytes: 0.12 10*3/uL — ABNORMAL HIGH (ref 0.00–0.07)
Basophils Absolute: 0 10*3/uL (ref 0.0–0.1)
Basophils Relative: 0 %
Eosinophils Absolute: 0 10*3/uL (ref 0.0–0.5)
Eosinophils Relative: 0 %
HCT: 38.2 % — ABNORMAL LOW (ref 39.0–52.0)
Hemoglobin: 11.9 g/dL — ABNORMAL LOW (ref 13.0–17.0)
Immature Granulocytes: 1 %
Lymphocytes Relative: 13 %
Lymphs Abs: 2.3 10*3/uL (ref 0.7–4.0)
MCH: 29.1 pg (ref 26.0–34.0)
MCHC: 31.2 g/dL (ref 30.0–36.0)
MCV: 93.4 fL (ref 80.0–100.0)
Monocytes Absolute: 1 10*3/uL (ref 0.1–1.0)
Monocytes Relative: 6 %
Neutro Abs: 14.3 10*3/uL — ABNORMAL HIGH (ref 1.7–7.7)
Neutrophils Relative %: 80 %
Platelets: 447 10*3/uL — ABNORMAL HIGH (ref 150–400)
RBC: 4.09 MIL/uL — ABNORMAL LOW (ref 4.22–5.81)
RDW: 12.8 % (ref 11.5–15.5)
WBC: 17.7 10*3/uL — ABNORMAL HIGH (ref 4.0–10.5)
nRBC: 0 % (ref 0.0–0.2)

## 2019-05-01 MED ORDER — DEXAMETHASONE 4 MG PO TABS
4.0000 mg | ORAL_TABLET | Freq: Three times a day (TID) | ORAL | Status: DC
  Administered 2019-05-01 – 2019-05-04 (×10): 4 mg via ORAL
  Filled 2019-05-01 (×11): qty 1

## 2019-05-01 NOTE — Plan of Care (Signed)

## 2019-05-01 NOTE — Progress Notes (Signed)
PROGRESS NOTE    Brandon Ferguson  ZOX:[id number] DOB: 1961/01/29 DOA: 04/27/2019 PCP: Patient, No Pcp Per   Brief Narrative:  Patient is a 58 year old African-American male with no past medical history but smoking for 45 pack years who presented to the emergency department with complaints of right hand weakness that started suddenly when he was working.  He was complaining of feeling episodic numbness on his left arm and right arm for at least a month.  He has not seen a doctor for a while.  He smokes a pack a day since the age of 45.  Drinks 4 beers a day.  In the emergency department, stroke alert was called.  CT head was consistent with metastatic lesions.  MRI imaging showed widespread brain metastasis and cervical vertebral metastatic lesions.  CT chest showed right upper lobe perihilar mass measuring 5.3 x 4.6 x 3.7 cm consistent with primary bronchogenic malignancy.  He underwent bronchogenic biopsy by pulmonary team , report is pending.  He has been transferred to [facility] for starting radiation therapy for brain metastasis.  Currently hemodynamically stable.  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  He underwent bronchoscopy by pulmonology with biopsy of endobronchial lesion on the right upper lobe.  Results pending.  Patient seen by neurosurgery, not a surgical candidate.  Currently on dexamethasone and Keppra.  Continue seizure precaution.  Seen by radiation oncology and he has been transferred to [facility] for simulation and starting radiation therapy this Monday. Outpatient referral to medical oncology will be done by radiation oncology. Currently he is alert and oriented.  Complains of back pain.  Smoker: Nicotine patch continued.  Counseled for cessation  Alcohol abuse: Drinks 4 beers every day.  No evidence of  withdrawal.  Continue thiamine, folic acid.         DVT prophylaxis:SCD Code Status: Full Family Communication:None at the bedside Disposition Plan: Home after full work-up.  Will request PT/OT evaluation.   Consultants: None  Procedures: None  Antimicrobials:  Anti-infectives (From admission, onward)   None      Subjective: Patient seen and examined the bedside this morning.  Hemodynamically stable.  No active issues.  He was interested on doing radiation therapy at [place] near his home.  Will check with radiation oncology tomorrow if that is possible.  Objective: Vitals:   04/30/19 0015 04/30/19 0404 04/30/19 1515 05/01/19 0531  BP: 108/80 132/78 129/75 130/86  Pulse: 75 60 68 61  Resp: 19 17 15 16   Temp: 98.4 F (36.9 C) 98.2 F (36.8 C) 97.6 F (36.4 C) 98.5 F (36.9 C)  TempSrc: Oral Oral Oral Oral  SpO2: 99% 99% 97% 97%  Weight:      Height:        Intake/Output Summary (Last 24 hours) at 05/01/2019 1256 Last data filed at 05/01/2019 1032 Gross per 24 hour  Intake 840 ml  Output 1450 ml  Net -610 ml   Filed Weights   04/27/19 1847 04/28/19 0500 04/29/19 0413  Weight: 74.8 kg 66.7 kg 68.4 kg    Examination:  General exam: Appears calm and comfortable ,Not in distress, generalized weakness  HEENT:PERRL,Oral mucosa moist, Ear/Nose normal on gross exam Respiratory system: Bilateral equal air entry, normal vesicular breath sounds, no wheezes or crackles  Cardiovascular system: S1 & S2 heard, RRR. No JVD, murmurs,  rubs, gallops or clicks. No pedal edema. Gastrointestinal system: Abdomen is nondistended, soft and nontender. No organomegaly or masses felt. Normal bowel sounds heard. Central nervous system: Alert and oriented. No focal neurological deficits. Extremities: No edema, no clubbing ,no cyanosis, distal peripheral pulses palpable. Skin: No rashes, lesions or ulcers,no icterus ,no pallor      Data Reviewed: I have personally reviewed  following labs and imaging studies  CBC: Recent Labs  Lab 04/27/19 1835 05/01/19 0555  WBC 9.9 17.7*  NEUTROABS 5.7 14.3*  HGB 11.3* 11.9*  HCT 35.6* 38.2*  MCV 92.2 93.4  PLT 428* 259*   Basic Metabolic Panel: Recent Labs  Lab 04/27/19 1835 04/30/19 0912  NA 135 132*  K 3.4* 4.1  CL 102 97*  CO2 24 26  GLUCOSE 153* 238*  BUN 14 15  CREATININE 0.77 0.65  CALCIUM 9.4 9.7   GFR: Estimated Creatinine Clearance: 96.2 mL/min (by C-G formula based on SCr of 0.65 mg/dL). Liver Function Tests: Recent Labs  Lab 04/27/19 1835  AST 20  ALT 14  ALKPHOS 100  BILITOT 0.6  PROT 8.4*  ALBUMIN 3.2*   No results for input(s): LIPASE, AMYLASE in the last 168 hours. No results for input(s): AMMONIA in the last 168 hours. Coagulation Profile: Recent Labs  Lab 04/27/19 1835  INR 1.2   Cardiac Enzymes: No results for input(s): CKTOTAL, CKMB, CKMBINDEX, TROPONINI in the last 168 hours. BNP (last 3 results) No results for input(s): PROBNP in the last 8760 hours. HbA1C: No results for input(s): HGBA1C in the last 72 hours. CBG: No results for input(s): GLUCAP in the last 168 hours. Lipid Profile: No results for input(s): CHOL, HDL, LDLCALC, TRIG, CHOLHDL, LDLDIRECT in the last 72 hours. Thyroid Function Tests: No results for input(s): TSH, T4TOTAL, FREET4, T3FREE, THYROIDAB in the last 72 hours. Anemia Panel: No results for input(s): VITAMINB12, FOLATE, FERRITIN, TIBC, IRON, RETICCTPCT in the last 72 hours. Sepsis Labs: No results for input(s): PROCALCITON, LATICACIDVEN in the last 168 hours.  Recent Results (from the past 240 hour(s))  SARS CORONAVIRUS 2 (TAT 6-24 HRS) Nasopharyngeal Nasopharyngeal Swab     Status: None   Collection Time: 04/27/19  6:57 PM   Specimen: Nasopharyngeal Swab  Result Value Ref Range Status   SARS Coronavirus 2 NEGATIVE NEGATIVE Final    Comment: (NOTE) SARS-CoV-2 target nucleic acids are NOT DETECTED. The SARS-CoV-2 RNA is generally  detectable in upper and lower respiratory specimens during the acute phase of infection. Negative results do not preclude SARS-CoV-2 infection, do not rule out co-infections with other pathogens, and should not be used as the sole basis for treatment or other patient management decisions. Negative results must be combined with clinical observations, patient history, and epidemiological information. The expected result is Negative. Fact Sheet for Patients: SugarRoll.be Fact Sheet for Healthcare Providers: https://www.woods-mathews.com/ This test is not yet approved or cleared by the [place] FDA and  has been authorized for detection and/or diagnosis of SARS-CoV-2 by FDA under an Emergency Use Authorization (EUA). This EUA will remain  in effect (meaning this test can be used) for the duration of the COVID-19 declaration under Section 56 4(b)(1) of the Act, 21 U.S.C. section 360bbb-3(b)(1), unless the authorization is terminated or revoked sooner. Performed at [facility], [place] [address], [place], [place] [address]   MRSA PCR Screening     Status: None   Collection Time: 04/28/19 12:50 AM   Specimen: Nasal Mucosa; Nasopharyngeal  Result Value Ref Range  Status   MRSA by PCR NEGATIVE NEGATIVE Final    Comment:        The GeneXpert MRSA Assay (FDA approved for NASAL specimens only), is one component of a comprehensive MRSA colonization surveillance program. It is not intended to diagnose MRSA infection nor to guide or monitor treatment for MRSA infections. Performed at [facility], [place] [address], [place], [place] [address]          Radiology Studies: No results found.      Scheduled Meds: . dexamethasone  4 mg Oral G3O  . folic acid  1 mg Oral Daily  . levETIRAcetam  500 mg Oral BID  . nicotine  21 mg Transdermal Daily  . pantoprazole  40 mg Oral Daily  . polyethylene glycol  17 g Oral Daily  . sodium  chloride flush  3 mL Intravenous Q12H  . thiamine  100 mg Oral Daily   Continuous Infusions: . sodium chloride 250 mL (04/29/19 2318)     LOS: 4 days    Time spent: 25 mins.More than 50% of that time was spent in counseling and/or coordination of care.      Shelly Coss, MD [facility] Pager [phone]  If 7PM-7AM, please contact night-coverage [email or web address] Password [facility] 05/01/2019, 12:56 PM

## 2019-05-01 NOTE — Evaluation (Signed)
Physical Therapy Evaluation Patient Details Name: Brandon Ferguson MRN: [id number] DOB: 07-10-1960 Today's Date: 05/01/2019   History of Present Illness  58 yo male with onset of R UE weakness was admitted and from bronchoscopy found masses in chest and then c-spine, with brain mets.  He is now scheduled for radiation to treat tumors.  Pt is Covid (-).   PMHx:  smoker 45 years, no recent medical care  Clinical Impression  Pt was seen for mobility on RW with notable weakness and tone on R Hip creating some difficulty controlling balance.  He is concerned about being uninsured, but agreed to let PT try to get his admission to CIR with screening initiated.  Follow acutely for balance and endurance training to increase control of gait and transfers, and progress as pt can tolerate.    Follow Up Recommendations CIR    Equipment Recommendations  None recommended by PT    Recommendations for Other Services Rehab consult     Precautions / Restrictions Precautions Precautions: Fall Precaution Comments: brain mets Restrictions Weight Bearing Restrictions: No      Mobility  Bed Mobility Overal bed mobility: Needs Assistance Bed Mobility: Supine to Sit;Sit to Supine     Supine to sit: Min assist Sit to supine: Min assist   General bed mobility comments: min assist to support trunk and min assist to get back to bed safetly  Transfers Overall transfer level: Needs assistance Equipment used: Rolling walker (2 wheeled);1 person hand held assist Transfers: Sit to/from Stand Sit to Stand: Mod assist         General transfer comment: mod to support and power up from the bed  Ambulation/Gait Ambulation/Gait assistance: Min assist Gait Distance (Feet): 120 Feet Assistive device: Rolling walker (2 wheeled);1 person hand held assist Gait Pattern/deviations: Step-through pattern;Narrow base of support;Trunk flexed;Decreased stride length Gait velocity: reduced Gait velocity  interpretation: <1.31 ft/sec, indicative of household ambulator General Gait Details: pt has controlled steps with walker but tends to drift to the L possibly due to LE [employer]            Wheelchair Mobility    Modified Rankin (Stroke Patients Only)       Balance Overall balance assessment: Needs assistance Sitting-balance support: Bilateral upper extremity supported;Feet supported Sitting balance-Leahy Scale: Fair     Standing balance support: Bilateral upper extremity supported;During functional activity Standing balance-Leahy Scale: Poor Standing balance comment: poor standing control due to tone and weakness on R hip                             Pertinent Vitals/Pain Pain Assessment: No/denies pain    Home Living Family/patient expects to be discharged to:: Unsure Living Arrangements: Alone               Additional Comments: girlfriend is available to help    Prior Function Level of Independence: Independent         Comments: does not own any AD's due to lack of need     Hand Dominance        Extremity/Trunk Assessment   Upper Extremity Assessment Upper Extremity Assessment: RUE deficits/detail RUE Deficits / Details: mild strength changes    Lower Extremity Assessment Lower Extremity Assessment: Generalized weakness    Cervical / Trunk Assessment Cervical / Trunk Assessment: Other exceptions(has c-spine bone lesions )  Communication   Communication: No difficulties  Cognition Arousal/Alertness: Awake/alert Behavior During Therapy: [facility]  for tasks assessed/performed Overall Cognitive Status: Within Functional Limits for tasks assessed                                        General Comments General comments (skin integrity, edema, etc.): pt was seen for moving on hallway with help to direct walker and steady balance.  He is unable to stand or sit without help to control balance, and is clearly unable to  steady himself alone even with a walker    Exercises     Assessment/Plan    PT Assessment Patient needs continued PT services  PT Problem List Decreased strength;Decreased range of motion;Decreased activity tolerance;Decreased balance;Decreased mobility;Decreased coordination;Decreased knowledge of use of DME;Decreased safety awareness;Decreased skin integrity       PT Treatment Interventions DME instruction;Gait training;Functional mobility training;Therapeutic activities;Therapeutic exercise;Balance training;Neuromuscular re-education;Patient/family education    PT Goals (Current goals can be found in the Care Plan section)  Acute Rehab PT Goals Patient Stated Goal: to get home with family and his significant other PT Goal Formulation: With patient Time For Goal Achievement: 05/15/19 Potential to Achieve Goals: Fair    Frequency Min 3X/week   Barriers to discharge Decreased caregiver support home alone and unsafe on walker    Co-evaluation               AM-PAC PT "6 Clicks" Mobility  Outcome Measure Help needed turning from your back to your side while in a flat bed without using bedrails?: None Help needed moving from lying on your back to sitting on the side of a flat bed without using bedrails?: A Little Help needed moving to and from a bed to a chair (including a wheelchair)?: A Little Help needed standing up from a chair using your arms (e.g., wheelchair or bedside chair)?: A Lot Help needed to walk in hospital room?: A Little Help needed climbing 3-5 steps with a railing? : Total 6 Click Score: 16    End of Session Equipment Utilized During Treatment: Gait belt Activity Tolerance: Patient limited by fatigue;Treatment limited secondary to medical complications (Comment) Patient left: in bed;with call bell/phone within reach;with bed alarm set;with nursing/sitter in room Nurse Communication: Mobility status PT Visit Diagnosis: Unsteadiness on feet  (R26.81);Muscle weakness (generalized) (M62.81);Other abnormalities of gait and mobility (R26.89)    Time: 6759-1638 PT Time Calculation (min) (ACUTE ONLY): 22 min   Charges:   PT Evaluation $PT Eval Moderate Complexity: 1 Mod         Ramond Dial 05/01/2019, 4:34 PM   Mee Hives, PT MS Acute Rehab Dept. Number: [place] [facility] [place]

## 2019-05-02 ENCOUNTER — Ambulatory Visit: Admit: 2019-05-02 | Payer: Medicaid Other | Admitting: Radiation Oncology

## 2019-05-02 ENCOUNTER — Encounter (HOSPITAL_COMMUNITY): Payer: Self-pay | Admitting: Oncology

## 2019-05-02 DIAGNOSIS — C3411 Malignant neoplasm of upper lobe, right bronchus or lung: Principal | ICD-10-CM

## 2019-05-02 LAB — CBC WITH DIFFERENTIAL/PLATELET
Abs Immature Granulocytes: 0.11 10*3/uL — ABNORMAL HIGH (ref 0.00–0.07)
Basophils Absolute: 0 10*3/uL (ref 0.0–0.1)
Basophils Relative: 0 %
Eosinophils Absolute: 0 10*3/uL (ref 0.0–0.5)
Eosinophils Relative: 0 %
HCT: 41 % (ref 39.0–52.0)
Hemoglobin: 12.6 g/dL — ABNORMAL LOW (ref 13.0–17.0)
Immature Granulocytes: 1 %
Lymphocytes Relative: 15 %
Lymphs Abs: 2.7 10*3/uL (ref 0.7–4.0)
MCH: 28.6 pg (ref 26.0–34.0)
MCHC: 30.7 g/dL (ref 30.0–36.0)
MCV: 93 fL (ref 80.0–100.0)
Monocytes Absolute: 1.1 10*3/uL — ABNORMAL HIGH (ref 0.1–1.0)
Monocytes Relative: 6 %
Neutro Abs: 13.8 10*3/uL — ABNORMAL HIGH (ref 1.7–7.7)
Neutrophils Relative %: 78 %
Platelets: 452 10*3/uL — ABNORMAL HIGH (ref 150–400)
RBC: 4.41 MIL/uL (ref 4.22–5.81)
RDW: 12.7 % (ref 11.5–15.5)
WBC: 17.8 10*3/uL — ABNORMAL HIGH (ref 4.0–10.5)
nRBC: 0 % (ref 0.0–0.2)

## 2019-05-02 LAB — GLUCOSE, CAPILLARY
Glucose-Capillary: 130 mg/dL — ABNORMAL HIGH (ref 70–99)
Glucose-Capillary: 105 mg/dL — ABNORMAL HIGH (ref 70–99)
Glucose-Capillary: 157 mg/dL — ABNORMAL HIGH (ref 70–99)

## 2019-05-02 LAB — CYTOLOGY - NON PAP

## 2019-05-02 LAB — SURGICAL PATHOLOGY

## 2019-05-02 MED ORDER — BUTAMBEN-TETRACAINE-BENZOCAINE 2-2-14 % EX AERO
1.0000 | INHALATION_SPRAY | Freq: Once | CUTANEOUS | Status: DC
  Filled 2019-05-02: qty 20

## 2019-05-02 MED ORDER — LIDOCAINE HCL URETHRAL/MUCOSAL 2 % EX GEL
1.0000 "application " | Freq: Once | CUTANEOUS | Status: DC
  Filled 2019-05-02: qty 5

## 2019-05-02 MED ORDER — PHENYLEPHRINE HCL 0.25 % NA SOLN
1.0000 | Freq: Four times a day (QID) | NASAL | Status: DC | PRN
  Filled 2019-05-02: qty 15

## 2019-05-02 MED ORDER — INSULIN ASPART 100 UNIT/ML ~~LOC~~ SOLN
0.0000 [IU] | Freq: Three times a day (TID) | SUBCUTANEOUS | Status: DC
  Administered 2019-05-03: 2 [IU] via SUBCUTANEOUS
  Administered 2019-05-04: 1 [IU] via SUBCUTANEOUS
  Administered 2019-05-04: 2 [IU] via SUBCUTANEOUS
  Administered 2019-05-04: 1 [IU] via SUBCUTANEOUS

## 2019-05-02 NOTE — Evaluation (Signed)
Occupational Therapy Evaluation Patient Details Name: Brandon Ferguson MRN: [id number] DOB: January 21, 1961 Today's Date: 05/02/2019    History of Present Illness 58 yo male with onset of R UE weakness was admitted and from bronchoscopy found masses in chest and then c-spine, with brain mets.  He is now scheduled for radiation to treat tumors.  Pt is Covid (-).   PMHx:  smoker 45 years, no recent medical care   Clinical Impression   Pt admitted with RUE weakness.  Pt will need post acute rehaband 24 hour A upon DC Pt currently with functional limitations due to the deficits listed below (see OT Problem List).  Pt will benefit from skilled OT to increase their safety and independence with ADL and functional mobility for ADL to facilitate discharge to venue listed below.      Follow Up Recommendations  CIR;Home health OT;Supervision/Assistance - 24 hour          Precautions / Restrictions Precautions Precautions: Fall Precaution Comments: brain mets Restrictions Weight Bearing Restrictions: No      Mobility Bed Mobility Overal bed mobility: Needs Assistance Bed Mobility: Supine to Sit     Supine to sit: Min assist     General bed mobility comments: min assist to support trunk and min assist to get back to bed safetly  Transfers Overall transfer level: Needs assistance Equipment used: Rolling walker (2 wheeled);1 person hand held assist Transfers: Sit to/from Stand Sit to Stand: Mod assist         General transfer comment: mod to support and power up from the bed    Balance Overall balance assessment: Needs assistance Sitting-balance support: Bilateral upper extremity supported;Feet supported Sitting balance-Leahy Scale: Fair     Standing balance support: Bilateral upper extremity supported;During functional activity Standing balance-Leahy Scale: Poor Standing balance comment: poor standing control due to tone and weakness on R hip                            ADL either performed or assessed with clinical judgement   ADL Overall ADL's : Needs assistance/impaired Eating/Feeding: Set up;Sitting   Grooming: Minimal assistance;Standing   Upper Body Bathing: Minimal assistance;Sitting   Lower Body Bathing: Moderate assistance;Sit to/from stand;Cueing for safety   Upper Body Dressing : Minimal assistance;Sitting   Lower Body Dressing: Moderate assistance;Sit to/from stand;Cueing for safety;Cueing for sequencing   Toilet Transfer: Minimal assistance;RW;Stand-pivot;Cueing for safety   Toileting- Clothing Manipulation and Hygiene: Moderate assistance;Sit to/from stand;Cueing for safety;Cueing for sequencing;Cueing for compensatory techniques         General ADL Comments: unsure amount of A pt has at home- will def need A with ADL acitvity upon DC                  Pertinent Vitals/Pain Pain Assessment: No/denies pain     Hand Dominance     Extremity/Trunk Assessment Upper Extremity Assessment Upper Extremity Assessment: RUE deficits/detail RUE Deficits / Details: mild strength changes       Cervical / Trunk Assessment Cervical / Trunk Assessment: (has c-spine bone lesions )   Communication Communication Communication: No difficulties   Cognition Arousal/Alertness: Awake/alert Behavior During Therapy: Flat affect Overall Cognitive Status: Within Functional Limits for tasks assessed  Home Living Family/patient expects to be discharged to:: Unsure Living Arrangements: Alone                               Additional Comments: girlfriend is available to help      Prior Functioning/Environment Level of Independence: Independent        Comments: does not own any AD's due to lack of need        OT Problem List: Decreased strength;Decreased activity tolerance;Impaired balance (sitting and/or standing);Decreased safety awareness;Decreased  knowledge of use of DME or AE;Decreased knowledge of precautions      OT Treatment/Interventions: Self-care/ADL training;Patient/family education;DME and/or AE instruction    OT Goals(Current goals can be found in the care plan section) Acute Rehab OT Goals Patient Stated Goal: to get home with family and his significant other OT Goal Formulation: With patient Time For Goal Achievement: 05/02/19  OT Frequency: Min 2X/week   Barriers to D/C: Decreased caregiver support             AM-PAC OT "6 Clicks" Daily Activity     Outcome Measure Help from another person eating meals?: A Little Help from another person taking care of personal grooming?: A Little Help from another person toileting, which includes using toliet, bedpan, or urinal?: A Lot Help from another person bathing (including washing, rinsing, drying)?: A Lot Help from another person to put on and taking off regular upper body clothing?: A Little Help from another person to put on and taking off regular lower body clothing?: A Lot 6 Click Score: 15   End of Session Equipment Utilized During Treatment: Gait belt;Rolling walker Nurse Communication: Mobility status  Activity Tolerance: Patient tolerated treatment well Patient left: in chair;with call bell/phone within reach  OT Visit Diagnosis: Unsteadiness on feet (R26.81);Other abnormalities of gait and mobility (R26.89);Muscle weakness (generalized) (M62.81)                Time: [id number] OT Time Calculation (min): 14 min Charges:  OT General Charges $OT Visit: 1 Visit OT Evaluation $OT Eval Moderate Complexity: 1 Mod  Kari Baars, [place] [phone] Office- [phone], Edwena Felty D 05/02/2019, 2:53 PM

## 2019-05-02 NOTE — Progress Notes (Signed)
   Name: Brandon Ferguson MRN: [id number] DOB: 04-Apr-1961    ADMISSION DATE:  04/27/2019 CONSULTATION DATE: 04/28/2019  REFERRING MD : [facility]  CHIEF COMPLAINT: Right-sided weakness incidental finding of brain mets and lung cancer    HISTORY OF PRESENT ILLNESS: 58 year old male who has had 2 episodes of right-sided weakness in the last month.  The last when he was admitted to [facility] and underwent a code stroke with CT of the head which had an incidental finding metastatic brain mets.  CT of the chest revealed multiple findings consistent with bronchogenic primary cancer.  Is also noted to have lytic lesions of the bone and brain.  He is a 1 pack a day smoker since age 9.  He underwent bronchoscopy on 11/20 which showed obstruction of apical segment of right upper lobe.  He was transferred to [facility] long for radiation treatment   Significant tests/ events reviewed  11/19/ 2020 CT of the chest shows a perihilar mass measuring 5.3 x 4.6 x 3.7 cm consistent with bronchogenic malignancy.  The mass abuts the major minor fissure with possible extension into to the lower lobe.  Probable 12 mm right hilar node.  Osseous metastatic disease multiple lytic lesions in the thoracic lumbar spine pelvis and sacrum.  Vague 8 mm low-density lesion left lower lobe of the liver  04/28/2019 MRI of the brain shows widespread metastatic disease to the brain.  Approximately 20 to individual brain metastases.   SUBJECTIVE:  Denies chest pain or dyspnea Sitting up in a chair   VITAL SIGNS: Temp:  [97.9 F (36.6 C)-98.5 F (36.9 C)] 98.3 F (36.8 C) (11/23 1348) Pulse Rate:  [62-70] 70 (11/23 1348) Resp:  [18-19] 18 (11/23 1348) BP: (112-148)/(75-86) 112/79 (11/23 1348) SpO2:  [97 %-100 %] 100 % (11/23 1348) Weight:  [63.3 kg] 63.3 kg (11/23 0500)  PHYSICAL EXAMINATION: General: Thin black male in no acute distress Neuro: Right side power 4/5, left 5/5 HEENT: No JVD  lymphadenopathy is appreciated Cardiovascular: Heart sounds are regular regular rate and rhythm Lungs: Decreased breath sounds bilateral Abdomen: Soft nontender positive bowel sounds Musculoskeletal: No deformity Skin: Warm and dry  Recent Labs  Lab 04/27/19 1835 04/30/19 0912  NA 135 132*  K 3.4* 4.1  CL 102 97*  CO2 24 26  BUN 14 15  CREATININE 0.77 0.65  GLUCOSE 153* 238*   Recent Labs  Lab 04/27/19 1835 05/01/19 0555 05/02/19 0517  HGB 11.3* 11.9* 12.6*  HCT 35.6* 38.2* 41.0  WBC 9.9 17.7* 17.8*  PLT 428* 447* 452*   No results found.  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  Not approachable by CT-guided needle aspiration, best approach for bronchoscopy    PLAN: Repeat bronchoscopy planned for 11/24 8 AM Risks and benefits discussed with the patient.  Alternative biopsy methods also discussed in detail.  He is agreeable to proceed  Kara Mead MD. FCCP. [facility]   05/02/2019

## 2019-05-02 NOTE — Consult Note (Signed)
Brandon Ferguson  [phone] [phone]   MEDICAL ONCOLOGY - INITIAL CONSULTATION  Referral MD: Dr. Shelly Coss  Reason for Referral: Lung mass, Bone lesions, Brain lesions  HPI: Brandon Ferguson is a 58 year old male with a past medical history significant for vitamin D deficiency, history of iron deficiency, and tobacco dependence.  He presented to the emergency room with right-sided weakness which started the day of admission.  He also had a history of left arm weakness and headache x1 week.  In the emergency room, he had a CT of the head which showed extensive edema in both frontal lobes most concerning for metastases.  Neurosurgery consult was obtained who recommended radiation oncology consult, dexamethasone, and Keppra.  Neurosurgery was not recommended.  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  A CT of the chest, abdomen, pelvis with contrast was also performed on 04/28/2019 which showed a right upper lobe perihilar mass measuring 5.3 x 4.6 x 3.7 cm consistent with primary bronchogenic malignancy, probable 12 mm right hilar node, osseous metastatic disease with multiple lytic lesions in the thoracic and lumbar spine, pelvis, and sacrum, vague 8 mm low-density lesion in the left lobe of the liver, enlarged prostate causing mild mass-effect on the bladder base.  The patient underwent video bronchoscopy on 04/29/2019 and biopsies were obtained.  Biopsies were all negative for malignant cells.  Of note, the patient has been seen by radiation oncology for consideration of whole brain radiation and palliative radiation to his lung mass, T3 lesion of the spine, lumbar spine, and right acetabulum/pubic ramus.  Additionally, he has been scheduled already for medical oncology as an outpatient with Dr.  [name] in [place], [place].  Today, the patient reports that prior to admission he was having intermittent headaches.  He reports that his upper extremity weakness has resolved.  He also reported that he had some blurred vision particularly when moving his head quickly.  He did not have any fevers or chills.  Denies anorexia and weight loss.  The patient denies chest pain, shortness of breath, cough, hemoptysis.  Denies abdominal pain, nausea, vomiting, constipation, diarrhea.  Reports that he is not currently having any pain but states that he has mild back pain when he lays down.  No difficulty walking.  He denies epistaxis, hematemesis, hematuria, melena, hematochezia.  The patient lives in [place], [place].  He is single and has no children.  States that he has difficulty with transportation.  He reports intermittent marijuana use, is a current smoker and has smoked 1 pack of cigarettes per day x45 years, currently drinks approximately one sixpack of beer per day.  Medical oncology was asked see the patient to make recommendations regarding his lung mass, bone lesions, and brain lesions.    Past Medical History:  Diagnosis Date  . Iron deficiency   . Vitamin D deficiency   :  Past Surgical History:  Procedure Laterality Date  . KNEE SURGERY    . VIDEO BRONCHOSCOPY Bilateral 04/29/2019   Procedure: VIDEO BRONCHOSCOPY WITH FLUORO;  Surgeon: Laurin Coder, MD;  Location: [facility];  Service: Endoscopy;  Laterality: Bilateral;  :  Current Facility-Administered Medications  Medication Dose Route Frequency Provider Last Rate Last Dose  . 0.9 %  sodium chloride infusion  250 mL Intravenous PRN Olalere, Adewale A, MD 10 mL/hr  at 04/29/19 2318 250 mL at 04/29/19 2318  . acetaminophen (TYLENOL) tablet 650 mg  650 mg Oral Q6H PRN Olalere, Adewale A, MD   650 mg at 05/01/19 2100   Or  . acetaminophen (TYLENOL) suppository 650 mg  650 mg Rectal Q6H PRN Olalere, Adewale  A, MD      . dexamethasone (DECADRON) tablet 4 mg  4 mg Oral Q8H Adhikari, Amrit, MD   4 mg at 05/02/19 0619  . folic acid (FOLVITE) tablet 1 mg  1 mg Oral Daily Olalere, Adewale A, MD   1 mg at 05/02/19 0944  . insulin aspart (novoLOG) injection 0-9 Units  0-9 Units Subcutaneous TID WC Adhikari, Amrit, MD      . iohexol (OMNIPAQUE) 300 MG/ML solution 100 mL  100 mL Intravenous Once PRN Olalere, Adewale A, MD      . levETIRAcetam (KEPPRA) tablet 500 mg  500 mg Oral BID Shelly Coss, MD   500 mg at 05/02/19 0944  . LORazepam (ATIVAN) injection 0.5 mg  0.5 mg Intravenous Q6H PRN Olalere, Adewale A, MD      . nicotine (NICODERM CQ - dosed in mg/24 hours) patch 21 mg  21 mg Transdermal Daily Olalere, Adewale A, MD   21 mg at 05/02/19 0946  . pantoprazole (PROTONIX) EC tablet 40 mg  40 mg Oral Daily Shelly Coss, MD   40 mg at 05/02/19 0944  . polyethylene glycol (MIRALAX / GLYCOLAX) packet 17 g  17 g Oral Daily Olalere, Adewale A, MD   17 g at 05/02/19 0943  . sodium chloride flush (NS) 0.9 % injection 3 mL  3 mL Intravenous Q12H Olalere, Adewale A, MD   3 mL at 05/01/19 2102  . sodium chloride flush (NS) 0.9 % injection 3 mL  3 mL Intravenous PRN Olalere, Adewale A, MD   3 mL at 04/27/19 2345  . thiamine (VITAMIN B-1) tablet 100 mg  100 mg Oral Daily Olalere, Adewale A, MD   100 mg at 05/02/19 0944     No Known Allergies:  Family History  Problem Relation Age of Onset  . Breast cancer Mother   . Cirrhosis Father   . Alcohol abuse Father   :  Social History   Socioeconomic History  . Marital status: Single    Spouse name: Not on file  . Number of children: Not on file  . Years of education: Not on file  . Highest education level: Not on file  Occupational History  . Not on file  Social Needs  . Financial resource strain: Not on file  . Food insecurity    Worry: Not on file    Inability: Not on file  . Transportation needs    Medical: Not on file    Non-medical: Not on file   Tobacco Use  . Smoking status: Current Every Day Smoker    Packs/day: 1.00    Types: Cigarettes  . Smokeless tobacco: Never Used  Substance and Sexual Activity  . Alcohol use: Yes    Alcohol/week: 6.0 standard drinks    Types: 6 Cans of beer per week    Comment: pt drinks 3 large beers daily  . Drug use: Yes    Types: Marijuana    Comment: daily  . Sexual activity: Not on file  Lifestyle  . Physical activity    Days per week: Not on file    Minutes per session: Not on file  . Stress: Not on file  Relationships  .  Social Herbalist on phone: Not on file    Gets together: Not on file    Attends religious service: Not on file    Active member of club or organization: Not on file    Attends meetings of clubs or organizations: Not on file    Relationship status: Not on file  . Intimate partner violence    Fear of current or ex partner: Not on file    Emotionally abused: Not on file    Physically abused: Not on file    Forced sexual activity: Not on file  Other Topics Concern  . Not on file  Social History Narrative  . Not on file  :  Review of Systems: A comprehensive 14 point review of systems was negative except as noted in the HPI.  Exam: Patient Vitals for the past 24 hrs:  BP Temp Temp src Pulse Resp SpO2 Weight  05/02/19 0541 127/75 97.9 F (36.6 C) Oral 63 19 97 % -  05/02/19 0500 - - - - - - 139 lb 8.8 oz (63.3 kg)  05/01/19 2121 (!) 148/86 98.5 F (36.9 C) Oral 62 18 99 % -  05/01/19 1315 (!) 144/90 98.3 F (36.8 C) Oral (!) 107 16 98 % -    General: Thin male who is in no acute distress Eyes: EOMI.  PERRL.  No scleral icterus.   ENT:  There were no oropharyngeal lesions.   Neck was without thyromegaly.   Lymphatics:  Negative cervical, supraclavicular or axillary adenopathy.   Respiratory: lungs were clear bilaterally without wheezing or crackles.   Cardiovascular:  Regular rate and rhythm, S1/S2, without murmur, rub or gallop.  There was no  pedal edema.   GI:  abdomen was soft, flat, nontender, nondistended, without organomegaly.   Musculoskeletal:  no spinal tenderness of palpation of vertebral spine.   Skin exam was without echymosis, petichae.   Neuro exam was nonfocal. Patient was alert and oriented.  Attention was good.   Language was appropriate.  Mood was normal without depression.  Speech was not pressured.  Thought content was not tangential.     Lab Results  Component Value Date   WBC 17.8 (H) 05/02/2019   HGB 12.6 (L) 05/02/2019   HCT 41.0 05/02/2019   PLT 452 (H) 05/02/2019   GLUCOSE 238 (H) 04/30/2019   ALT 14 04/27/2019   AST 20 04/27/2019   NA 132 (L) 04/30/2019   K 4.1 04/30/2019   CL 97 (L) 04/30/2019   CREATININE 0.65 04/30/2019   BUN 15 04/30/2019   CO2 26 04/30/2019    Ct Chest W Contrast  Result Date: 04/28/2019 CLINICAL DATA:  Brain metastasis with unknown primary. EXAM: CT CHEST, ABDOMEN, AND PELVIS WITH CONTRAST TECHNIQUE: Multidetector CT imaging of the chest, abdomen and pelvis was performed following the standard protocol during bolus administration of intravenous contrast. CONTRAST:  100 cc Omnipaque 300 IV COMPARISON:  No prior chest or abdominal imaging. FINDINGS: CT CHEST FINDINGS Cardiovascular: Thoracic aorta is normal in caliber. Trace atherosclerosis of the aortic arch. Small amount pericardial fluid in the superior pericardial recess. Small coronary artery calcifications. Heart is normal in size. Mediastinum/Nodes: Probable 12 mm right hilar node, contiguous with right upper lobe perihilar mass. No enlarged mediastinal lymph nodes. No left hilar adenopathy. No supraclavicular adenopathy. Visualized thyroid gland is normal. Decompressed esophagus. Lungs/Pleura: Perihilar right upper lobe 5.3 x 4.6 x 3.7 cm pulmonary mass has irregular margins and surrounding ground-glass opacity. This causes  mass effect and narrowing of the right upper lobe bronchus. Mass abuts the minor fissure with  fissural thickening extending laterally. Mass abuts the major fissure with localized mass effect slight nodular protrusion a possible extension into the lower lobe, for example series 7, image 48. Biapical emphysema with bulla at the left lung apex. No additional pulmonary mass or nodule. Subpleural atelectasis in the dependent left lower lobe. Musculoskeletal: Bone marrow diffusely heterogeneous in density. Lytic lesion with posterior left T3 vertebral body with posterior cortex involvement and extension to the spinal canal. Lytic lesion involving posterior right T1 vertebral body extending into the pedicle. T8 vertebral body lesion may represent a hemangioma but is nonspecific. Lytic lesion within T10 suspicious for metastatic disease. CT ABDOMEN PELVIS FINDINGS Hepatobiliary: Vague 8 mm low-density lesion in the left lobe of the liver, series 3, image 57. Focal fatty infiltration adjacent to the falciform ligament. Gallbladder is unremarkable. No biliary dilatation. Pancreas: Unremarkable. No pancreatic ductal dilatation or surrounding inflammatory changes. No evidence of pancreatic mass. Spleen: Normal in size without focal abnormality. Adrenals/Urinary Tract: No adrenal nodule. Left greater than right pelvicaliectasis and prominence of the ureters, likely related to bladder distension. No ureteral stones or cause of obstruction. Urinary bladder is distended without wall thickening. Homogeneous renal enhancement with symmetric excretion on delayed phase imaging. No evidence of focal renal mass. Stomach/Bowel: Stomach is within normal limits. Appendix appears normal. No evidence of bowel wall thickening, distention, or inflammatory changes. No evidence of colonic or small bowel mass. Vascular/Lymphatic: Aorto bi-iliac atherosclerosis. No aneurysm. Patent portal vein. No retroperitoneal adenopathy. No mesenteric or upper abdominal adenopathy. 7 mm right external iliac node is nonspecific. No enlarged inguinal  nodes. Reproductive: Heterogeneous enlarged prostate gland spanning 5 cm. This causes mild mass effect on the bladder base. Other: No ascites, free air, or omental nodularity. Small fat containing umbilical hernia. Musculoskeletal: Bone marrow diffusely heterogeneous. Lytic lesion within L4 vertebral body with mild pathologic compression fracture. Lesion extends into the right pedicle. Lytic lesion within anterior L5 vertebral body. Lytic lesion within the right superior pubic ramus extending to the puboacetabular junction. Suspected small central lytic lesion in the sacrum. Suspected small lytic lesion in the left anterior iliac bone and left iliac crest. IMPRESSION: 1. Right upper lobe perihilar mass measuring 5.3 x 4.6 x 3.7 cm consistent with primary bronchogenic malignancy. Mass abuts the major and minor minor fissure with possible extension into the lower lobe. 2. Probable 12 mm right hilar node, contiguous with the right upper lobe perihilar mass. 3. Osseous metastatic disease with multiple lytic lesions in the thoracic and lumbar spine, pelvis and sacrum. 4. Vague 8 mm low-density lesion in the left lobe of the liver, nonspecific in the setting. 5. Enlarged prostate gland causing mild mass effect on the bladder base. 6. Urinary bladder and bilateral renal collecting system distention may be due to mass effect from enlarged prostate gland. Aortic Atherosclerosis (ICD10-I70.0) and Emphysema (ICD10-J43.9). Electronically Signed   By: Keith Rake M.D.   On: 04/28/2019 03:42   Mr Jeri Cos TG Contrast  Result Date: 04/28/2019 CLINICAL DATA:  58 year old male with code stroke presentation, plain head CT suggesting metastatic disease to the brain. EXAM: MRI HEAD WITHOUT AND WITH CONTRAST MRI CERVICAL SPINE WITHOUT AND WITH CONTRAST TECHNIQUE: Multiplanar, multiecho pulse sequences of the brain and surrounding structures, and cervical spine, to include the craniocervical junction and cervicothoracic  junction, were obtained without and with intravenous contrast. CONTRAST:  7.55mL GADAVIST GADOBUTROL 1 MMOL/ML IV SOLN  COMPARISON:  Head CT 04/27/2019. FINDINGS: MRI HEAD FINDINGS Brain: Multiple enhancing brain metastases ranging from punctate to 26 millimeters diameter. Approximately 22 individual metastases are identified, with 1 or 2 additional questionable appearing areas (small left choroid plexus versus subependymal metastasis on series 38, image 28). There is an unusual trans-falcine lobulated and enhancing mass measuring about 23 millimeters (series 39, image 13) with microhemorrhage and an adjacent more cystic 12 centimeter metastasis. Associated mild regional dural thickening. All of the other lesions larger than 1 centimeter are cystic and rim enhancing. No other dural involvement or dural thickening identified. No leptomeningeal tumor identified. Confluent vasogenic edema in both frontal and parietal lobes as seen by CT earlier today. Regional mass effect with no midline shift or loss of basilar cisterns. Mass effect on both lateral ventricles with no ventriculomegaly or transependymal edema. No superimposed restricted diffusion suggestive of acute infarction. No acute intracranial hemorrhage. Cervicomedullary junction and pituitary are within normal limits. Vascular: Major intracranial vascular flow voids are preserved. The major dural venous sinuses are enhancing and appear to be patent. Skull and upper cervical spine: Cervical spine reported separately. Calvarium bone marrow signal is within normal limits. Sinuses/Orbits: Negative orbits. Trace paranasal sinus mucosal thickening. Other: Mastoids are clear. Visible internal auditory structures appear normal. Scalp and face soft tissues appear negative. MRI CERVICAL SPINE FINDINGS Alignment: Mild straightening of cervical lordosis. No spondylolisthesis. Vertebrae: Abnormal marrow signal from the C3 vertebra into the visible upper thoracic spine,  although little abnormal enhancement and marrow edema in the cervical vertebrae. A 12 millimeter T1 and T2 heterogeneous lesion in the anterior C3 body most resembles a benign hemangioma. There is a small enhancing metastasis in the superior right T1 body. There is subtotal T3 vertebral body replacement by enhancing tumor. Cord: Spinal cord signal is within normal limits at all visualized levels. Posterior Fossa, vertebral arteries, paraspinal tissues: Preserved major vascular flow voids in the neck. No cervical lymphadenopathy identified. Disc levels: No cervical spine epidural or extraosseous tumor identified. However, there does appear to be early ventral epidural space extension of tumor at the left T3 body. There is superimposed cervical spine degeneration which results in spinal stenosis with mild spinal cord mass effect at C4-C5, C5-C6, and C6-C7. No associated cord signal abnormality. IMPRESSION: 1. 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 Approximately 22 individual brain metastases ranging from punctate to 26 mm diameter, including an unusual trans-falcine lesion with some associated microhemorrhage and dural involvement. 2. Confluent cerebral vasogenic edema with no midline shift or loss of basilar cisterns. 3. Abnormal marrow signal throughout much of the cervical spine suspicious for widespread bone metastases although enhancing metastases are only confirmed in the visible upper thoracic spine (including with early ventral epidural spread at T3). 4. Superimposed cervical spine degeneration with degenerative spinal stenosis at C4-C5 through C6-C7 with degenerative spinal cord mass effect but no associated cord signal abnormality. Electronically Signed   By: Genevie Ann M.D.   On: 04/28/2019 03:06   Mr Cervical Spine W Wo Contrast  Result Date: 04/28/2019 CLINICAL DATA:  58 year old male with code stroke presentation, plain head CT suggesting metastatic disease to the brain. EXAM: MRI HEAD WITHOUT AND  WITH CONTRAST MRI CERVICAL SPINE WITHOUT AND WITH CONTRAST TECHNIQUE: Multiplanar, multiecho pulse sequences of the brain and surrounding structures, and cervical spine, to include the craniocervical junction and cervicothoracic junction, were obtained without and with intravenous contrast. CONTRAST:  7.75mL GADAVIST GADOBUTROL 1 MMOL/ML IV SOLN COMPARISON:  Head CT 04/27/2019. FINDINGS: MRI HEAD  FINDINGS Brain: Multiple enhancing brain metastases ranging from punctate to 26 millimeters diameter. Approximately 22 individual metastases are identified, with 1 or 2 additional questionable appearing areas (small left choroid plexus versus subependymal metastasis on series 38, image 28). There is an unusual trans-falcine lobulated and enhancing mass measuring about 23 millimeters (series 39, image 13) with microhemorrhage and an adjacent more cystic 12 centimeter metastasis. Associated mild regional dural thickening. All of the other lesions larger than 1 centimeter are cystic and rim enhancing. No other dural involvement or dural thickening identified. No leptomeningeal tumor identified. Confluent vasogenic edema in both frontal and parietal lobes as seen by CT earlier today. Regional mass effect with no midline shift or loss of basilar cisterns. Mass effect on both lateral ventricles with no ventriculomegaly or transependymal edema. No superimposed restricted diffusion suggestive of acute infarction. No acute intracranial hemorrhage. Cervicomedullary junction and pituitary are within normal limits. Vascular: Major intracranial vascular flow voids are preserved. The major dural venous sinuses are enhancing and appear to be patent. Skull and upper cervical spine: Cervical spine reported separately. Calvarium bone marrow signal is within normal limits. Sinuses/Orbits: Negative orbits. Trace paranasal sinus mucosal thickening. Other: Mastoids are clear. Visible internal auditory structures appear normal. Scalp and face soft  tissues appear negative. MRI CERVICAL SPINE FINDINGS Alignment: Mild straightening of cervical lordosis. No spondylolisthesis. Vertebrae: Abnormal marrow signal from the C3 vertebra into the visible upper thoracic spine, although little abnormal enhancement and marrow edema in the cervical vertebrae. A 12 millimeter T1 and T2 heterogeneous lesion in the anterior C3 body most resembles a benign hemangioma. There is a small enhancing metastasis in the superior right T1 body. There is subtotal T3 vertebral body replacement by enhancing tumor. Cord: Spinal cord signal is within normal limits at all visualized levels. Posterior Fossa, vertebral arteries, paraspinal tissues: Preserved major vascular flow voids in the neck. No cervical lymphadenopathy identified. Disc levels: No cervical spine epidural or extraosseous tumor identified. However, there does appear to be early ventral epidural space extension of tumor at the left T3 body. There is superimposed cervical spine degeneration which results in spinal stenosis with mild spinal cord mass effect at C4-C5, C5-C6, and C6-C7. No associated cord signal abnormality. IMPRESSION: 1. 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 Approximately 22 individual brain metastases ranging from punctate to 26 mm diameter, including an unusual trans-falcine lesion with some associated microhemorrhage and dural involvement. 2. Confluent cerebral vasogenic edema with no midline shift or loss of basilar cisterns. 3. Abnormal marrow signal throughout much of the cervical spine suspicious for widespread bone metastases although enhancing metastases are only confirmed in the visible upper thoracic spine (including with early ventral epidural spread at T3). 4. Superimposed cervical spine degeneration with degenerative spinal stenosis at C4-C5 through C6-C7 with degenerative spinal cord mass effect but no associated cord signal abnormality. Electronically Signed   By: Genevie Ann M.D.   On:  04/28/2019 03:06   Ct Abdomen Pelvis W Contrast  Result Date: 04/28/2019 CLINICAL DATA:  Brain metastasis with unknown primary. EXAM: CT CHEST, ABDOMEN, AND PELVIS WITH CONTRAST TECHNIQUE: Multidetector CT imaging of the chest, abdomen and pelvis was performed following the standard protocol during bolus administration of intravenous contrast. CONTRAST:  100 cc Omnipaque 300 IV COMPARISON:  No prior chest or abdominal imaging. FINDINGS: CT CHEST FINDINGS Cardiovascular: Thoracic aorta is normal in caliber. Trace atherosclerosis of the aortic arch. Small amount pericardial fluid in the superior pericardial recess. Small coronary artery calcifications. Heart is normal in  size. Mediastinum/Nodes: Probable 12 mm right hilar node, contiguous with right upper lobe perihilar mass. No enlarged mediastinal lymph nodes. No left hilar adenopathy. No supraclavicular adenopathy. Visualized thyroid gland is normal. Decompressed esophagus. Lungs/Pleura: Perihilar right upper lobe 5.3 x 4.6 x 3.7 cm pulmonary mass has irregular margins and surrounding ground-glass opacity. This causes mass effect and narrowing of the right upper lobe bronchus. Mass abuts the minor fissure with fissural thickening extending laterally. Mass abuts the major fissure with localized mass effect slight nodular protrusion a possible extension into the lower lobe, for example series 7, image 48. Biapical emphysema with bulla at the left lung apex. No additional pulmonary mass or nodule. Subpleural atelectasis in the dependent left lower lobe. Musculoskeletal: Bone marrow diffusely heterogeneous in density. Lytic lesion with posterior left T3 vertebral body with posterior cortex involvement and extension to the spinal canal. Lytic lesion involving posterior right T1 vertebral body extending into the pedicle. T8 vertebral body lesion may represent a hemangioma but is nonspecific. Lytic lesion within T10 suspicious for metastatic disease. CT ABDOMEN  PELVIS FINDINGS Hepatobiliary: Vague 8 mm low-density lesion in the left lobe of the liver, series 3, image 57. Focal fatty infiltration adjacent to the falciform ligament. Gallbladder is unremarkable. No biliary dilatation. Pancreas: Unremarkable. No pancreatic ductal dilatation or surrounding inflammatory changes. No evidence of pancreatic mass. Spleen: Normal in size without focal abnormality. Adrenals/Urinary Tract: No adrenal nodule. Left greater than right pelvicaliectasis and prominence of the ureters, likely related to bladder distension. No ureteral stones or cause of obstruction. Urinary bladder is distended without wall thickening. Homogeneous renal enhancement with symmetric excretion on delayed phase imaging. No evidence of focal renal mass. Stomach/Bowel: Stomach is within normal limits. Appendix appears normal. No evidence of bowel wall thickening, distention, or inflammatory changes. No evidence of colonic or small bowel mass. Vascular/Lymphatic: Aorto bi-iliac atherosclerosis. No aneurysm. Patent portal vein. No retroperitoneal adenopathy. No mesenteric or upper abdominal adenopathy. 7 mm right external iliac node is nonspecific. No enlarged inguinal nodes. Reproductive: Heterogeneous enlarged prostate gland spanning 5 cm. This causes mild mass effect on the bladder base. Other: No ascites, free air, or omental nodularity. Small fat containing umbilical hernia. Musculoskeletal: Bone marrow diffusely heterogeneous. Lytic lesion within L4 vertebral body with mild pathologic compression fracture. Lesion extends into the right pedicle. Lytic lesion within anterior L5 vertebral body. Lytic lesion within the right superior pubic ramus extending to the puboacetabular junction. Suspected small central lytic lesion in the sacrum. Suspected small lytic lesion in the left anterior iliac bone and left iliac crest. IMPRESSION: 1. Right upper lobe perihilar mass measuring 5.3 x 4.6 x 3.7 cm consistent with  primary bronchogenic malignancy. Mass abuts the major and minor minor fissure with possible extension into the lower lobe. 2. Probable 12 mm right hilar node, contiguous with the right upper lobe perihilar mass. 3. Osseous metastatic disease with multiple lytic lesions in the thoracic and lumbar spine, pelvis and sacrum. 4. Vague 8 mm low-density lesion in the left lobe of the liver, nonspecific in the setting. 5. Enlarged prostate gland causing mild mass effect on the bladder base. 6. Urinary bladder and bilateral renal collecting system distention may be due to mass effect from enlarged prostate gland. Aortic Atherosclerosis (ICD10-I70.0) and Emphysema (ICD10-J43.9). Electronically Signed   By: Keith Rake M.D.   On: 04/28/2019 03:42   Ct Head Code Stroke Wo Contrast  Result Date: 04/27/2019 CLINICAL DATA:  Code stroke.  Right-sided weakness. EXAM: CT HEAD WITHOUT CONTRAST TECHNIQUE:  Contiguous axial images were obtained from the base of the skull through the vertex without intravenous contrast. COMPARISON:  None. FINDINGS: Brain: Rounded low-density masses posteriorly in both frontal lobes near the vertex each measure 1.7 cm, and there is a 2.5 cm low-density mass more anteriorly in the left frontal lobe. There is extensive vasogenic edema in the left greater than right frontal lobes with regional sulcal effacement and slight mass effect on the lateral ventricles without midline shift. A small area of edema is noted in the right occipital lobe without a presumed underlying lesion visible on this unenhanced study. No acute cortically based infarct, intracranial hemorrhage, or extra-axial fluid collection is identified. Vascular: No hyperdense vessel. Skull: No fracture or focal osseous lesion. Sinuses/Orbits: Visualized paranasal sinuses and mastoid air cells are clear. Orbits are unremarkable. Other: None. ASPECTS [facility] Stroke Program Early CT Score) Not scored due to the presence of multiple  cerebral masses. IMPRESSION: 1. 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 Brain MRI without and with contrast is recommended for further evaluation. 2. No midline shift or intracranial hemorrhage. These results were called by telephone at the time of interpretation on 04/27/2019 at 6:45 pm to Dr. Ezequiel Essex, who verbally acknowledged these results. Electronically Signed   By: Logan Bores M.D.   On: 04/27/2019 18:47     Ct Chest W Contrast  Result Date: 04/28/2019 CLINICAL DATA:  Brain metastasis with unknown primary. EXAM: CT CHEST, ABDOMEN, AND PELVIS WITH CONTRAST TECHNIQUE: Multidetector CT imaging of the chest, abdomen and pelvis was performed following the standard protocol during bolus administration of intravenous contrast. CONTRAST:  100 cc Omnipaque 300 IV COMPARISON:  No prior chest or abdominal imaging. FINDINGS: CT CHEST FINDINGS Cardiovascular: Thoracic aorta is normal in caliber. Trace atherosclerosis of the aortic arch. Small amount pericardial fluid in the superior pericardial recess. Small coronary artery calcifications. Heart is normal in size. Mediastinum/Nodes: Probable 12 mm right hilar node, contiguous with right upper lobe perihilar mass. No enlarged mediastinal lymph nodes. No left hilar adenopathy. No supraclavicular adenopathy. Visualized thyroid gland is normal. Decompressed esophagus. Lungs/Pleura: Perihilar right upper lobe 5.3 x 4.6 x 3.7 cm pulmonary mass has irregular margins and surrounding ground-glass opacity. This causes mass effect and narrowing of the right upper lobe bronchus. Mass abuts the minor fissure with fissural thickening extending laterally. Mass abuts the major fissure with localized mass effect slight nodular protrusion a possible extension into the lower lobe, for example series 7, image 48. Biapical emphysema with bulla at the left lung apex. No additional pulmonary mass or nodule. Subpleural  atelectasis in the dependent left lower lobe. Musculoskeletal: Bone marrow diffusely heterogeneous in density. Lytic lesion with posterior left T3 vertebral body with posterior cortex involvement and extension to the spinal canal. Lytic lesion involving posterior right T1 vertebral body extending into the pedicle. T8 vertebral body lesion may represent a hemangioma but is nonspecific. Lytic lesion within T10 suspicious for metastatic disease. CT ABDOMEN PELVIS FINDINGS Hepatobiliary: Vague 8 mm low-density lesion in the left lobe of the liver, series 3, image 57. Focal fatty infiltration adjacent to the falciform ligament. Gallbladder is unremarkable. No biliary dilatation. Pancreas: Unremarkable. No pancreatic ductal dilatation or surrounding inflammatory changes. No evidence of pancreatic mass. Spleen: Normal in size without focal abnormality. Adrenals/Urinary Tract: No adrenal nodule. Left greater than right pelvicaliectasis and prominence of the ureters, likely related to bladder distension. No ureteral stones or cause of obstruction. Urinary bladder is distended  without wall thickening. Homogeneous renal enhancement with symmetric excretion on delayed phase imaging. No evidence of focal renal mass. Stomach/Bowel: Stomach is within normal limits. Appendix appears normal. No evidence of bowel wall thickening, distention, or inflammatory changes. No evidence of colonic or small bowel mass. Vascular/Lymphatic: Aorto bi-iliac atherosclerosis. No aneurysm. Patent portal vein. No retroperitoneal adenopathy. No mesenteric or upper abdominal adenopathy. 7 mm right external iliac node is nonspecific. No enlarged inguinal nodes. Reproductive: Heterogeneous enlarged prostate gland spanning 5 cm. This causes mild mass effect on the bladder base. Other: No ascites, free air, or omental nodularity. Small fat containing umbilical hernia. Musculoskeletal: Bone marrow diffusely heterogeneous. Lytic lesion within L4 vertebral  body with mild pathologic compression fracture. Lesion extends into the right pedicle. Lytic lesion within anterior L5 vertebral body. Lytic lesion within the right superior pubic ramus extending to the puboacetabular junction. Suspected small central lytic lesion in the sacrum. Suspected small lytic lesion in the left anterior iliac bone and left iliac crest. IMPRESSION: 1. Right upper lobe perihilar mass measuring 5.3 x 4.6 x 3.7 cm consistent with primary bronchogenic malignancy. Mass abuts the major and minor minor fissure with possible extension into the lower lobe. 2. Probable 12 mm right hilar node, contiguous with the right upper lobe perihilar mass. 3. Osseous metastatic disease with multiple lytic lesions in the thoracic and lumbar spine, pelvis and sacrum. 4. Vague 8 mm low-density lesion in the left lobe of the liver, nonspecific in the setting. 5. Enlarged prostate gland causing mild mass effect on the bladder base. 6. Urinary bladder and bilateral renal collecting system distention may be due to mass effect from enlarged prostate gland. Aortic Atherosclerosis (ICD10-I70.0) and Emphysema (ICD10-J43.9). Electronically Signed   By: Keith Rake M.D.   On: 04/28/2019 03:42   Mr Jeri Cos IE Contrast  Result Date: 04/28/2019 CLINICAL DATA:  58 year old male with code stroke presentation, plain head CT suggesting metastatic disease to the brain. EXAM: MRI HEAD WITHOUT AND WITH CONTRAST MRI CERVICAL SPINE WITHOUT AND WITH CONTRAST TECHNIQUE: Multiplanar, multiecho pulse sequences of the brain and surrounding structures, and cervical spine, to include the craniocervical junction and cervicothoracic junction, were obtained without and with intravenous contrast. CONTRAST:  7.50mL GADAVIST GADOBUTROL 1 MMOL/ML IV SOLN COMPARISON:  Head CT 04/27/2019. FINDINGS: MRI HEAD FINDINGS Brain: Multiple enhancing brain metastases ranging from punctate to 26 millimeters diameter. Approximately 22 individual metastases  are identified, with 1 or 2 additional questionable appearing areas (small left choroid plexus versus subependymal metastasis on series 38, image 28). There is an unusual trans-falcine lobulated and enhancing mass measuring about 23 millimeters (series 39, image 13) with microhemorrhage and an adjacent more cystic 12 centimeter metastasis. Associated mild regional dural thickening. All of the other lesions larger than 1 centimeter are cystic and rim enhancing. No other dural involvement or dural thickening identified. No leptomeningeal tumor identified. Confluent vasogenic edema in both frontal and parietal lobes as seen by CT earlier today. Regional mass effect with no midline shift or loss of basilar cisterns. Mass effect on both lateral ventricles with no ventriculomegaly or transependymal edema. No superimposed restricted diffusion suggestive of acute infarction. No acute intracranial hemorrhage. Cervicomedullary junction and pituitary are within normal limits. Vascular: Major intracranial vascular flow voids are preserved. The major dural venous sinuses are enhancing and appear to be patent. Skull and upper cervical spine: Cervical spine reported separately. Calvarium bone marrow signal is within normal limits. Sinuses/Orbits: Negative orbits. Trace paranasal sinus mucosal thickening. Other: Mastoids are clear.  Visible internal auditory structures appear normal. Scalp and face soft tissues appear negative. MRI CERVICAL SPINE FINDINGS Alignment: Mild straightening of cervical lordosis. No spondylolisthesis. Vertebrae: Abnormal marrow signal from the C3 vertebra into the visible upper thoracic spine, although little abnormal enhancement and marrow edema in the cervical vertebrae. A 12 millimeter T1 and T2 heterogeneous lesion in the anterior C3 body most resembles a benign hemangioma. There is a small enhancing metastasis in the superior right T1 body. There is subtotal T3 vertebral body replacement by enhancing  tumor. Cord: Spinal cord signal is within normal limits at all visualized levels. Posterior Fossa, vertebral arteries, paraspinal tissues: Preserved major vascular flow voids in the neck. No cervical lymphadenopathy identified. Disc levels: No cervical spine epidural or extraosseous tumor identified. However, there does appear to be early ventral epidural space extension of tumor at the left T3 body. There is superimposed cervical spine degeneration which results in spinal stenosis with mild spinal cord mass effect at C4-C5, C5-C6, and C6-C7. No associated cord signal abnormality. IMPRESSION: 1. 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 Approximately 22 individual brain metastases ranging from punctate to 26 mm diameter, including an unusual trans-falcine lesion with some associated microhemorrhage and dural involvement. 2. Confluent cerebral vasogenic edema with no midline shift or loss of basilar cisterns. 3. Abnormal marrow signal throughout much of the cervical spine suspicious for widespread bone metastases although enhancing metastases are only confirmed in the visible upper thoracic spine (including with early ventral epidural spread at T3). 4. Superimposed cervical spine degeneration with degenerative spinal stenosis at C4-C5 through C6-C7 with degenerative spinal cord mass effect but no associated cord signal abnormality. Electronically Signed   By: Genevie Ann M.D.   On: 04/28/2019 03:06   Mr Cervical Spine W Wo Contrast  Result Date: 04/28/2019 CLINICAL DATA:  58 year old male with code stroke presentation, plain head CT suggesting metastatic disease to the brain. EXAM: MRI HEAD WITHOUT AND WITH CONTRAST MRI CERVICAL SPINE WITHOUT AND WITH CONTRAST TECHNIQUE: Multiplanar, multiecho pulse sequences of the brain and surrounding structures, and cervical spine, to include the craniocervical junction and cervicothoracic junction, were obtained without and with intravenous contrast. CONTRAST:  7.34mL GADAVIST  GADOBUTROL 1 MMOL/ML IV SOLN COMPARISON:  Head CT 04/27/2019. FINDINGS: MRI HEAD FINDINGS Brain: Multiple enhancing brain metastases ranging from punctate to 26 millimeters diameter. Approximately 22 individual metastases are identified, with 1 or 2 additional questionable appearing areas (small left choroid plexus versus subependymal metastasis on series 38, image 28). There is an unusual trans-falcine lobulated and enhancing mass measuring about 23 millimeters (series 39, image 13) with microhemorrhage and an adjacent more cystic 12 centimeter metastasis. Associated mild regional dural thickening. All of the other lesions larger than 1 centimeter are cystic and rim enhancing. No other dural involvement or dural thickening identified. No leptomeningeal tumor identified. Confluent vasogenic edema in both frontal and parietal lobes as seen by CT earlier today. Regional mass effect with no midline shift or loss of basilar cisterns. Mass effect on both lateral ventricles with no ventriculomegaly or transependymal edema. No superimposed restricted diffusion suggestive of acute infarction. No acute intracranial hemorrhage. Cervicomedullary junction and pituitary are within normal limits. Vascular: Major intracranial vascular flow voids are preserved. The major dural venous sinuses are enhancing and appear to be patent. Skull and upper cervical spine: Cervical spine reported separately. Calvarium bone marrow signal is within normal limits. Sinuses/Orbits: Negative orbits. Trace paranasal sinus mucosal thickening. Other: Mastoids are clear. Visible internal auditory structures appear normal. Scalp and  face soft tissues appear negative. MRI CERVICAL SPINE FINDINGS Alignment: Mild straightening of cervical lordosis. No spondylolisthesis. Vertebrae: Abnormal marrow signal from the C3 vertebra into the visible upper thoracic spine, although little abnormal enhancement and marrow edema in the cervical vertebrae. A 12 millimeter  T1 and T2 heterogeneous lesion in the anterior C3 body most resembles a benign hemangioma. There is a small enhancing metastasis in the superior right T1 body. There is subtotal T3 vertebral body replacement by enhancing tumor. Cord: Spinal cord signal is within normal limits at all visualized levels. Posterior Fossa, vertebral arteries, paraspinal tissues: Preserved major vascular flow voids in the neck. No cervical lymphadenopathy identified. Disc levels: No cervical spine epidural or extraosseous tumor identified. However, there does appear to be early ventral epidural space extension of tumor at the left T3 body. There is superimposed cervical spine degeneration which results in spinal stenosis with mild spinal cord mass effect at C4-C5, C5-C6, and C6-C7. No associated cord signal abnormality. IMPRESSION: 1. 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 Approximately 22 individual brain metastases ranging from punctate to 26 mm diameter, including an unusual trans-falcine lesion with some associated microhemorrhage and dural involvement. 2. Confluent cerebral vasogenic edema with no midline shift or loss of basilar cisterns. 3. Abnormal marrow signal throughout much of the cervical spine suspicious for widespread bone metastases although enhancing metastases are only confirmed in the visible upper thoracic spine (including with early ventral epidural spread at T3). 4. Superimposed cervical spine degeneration with degenerative spinal stenosis at C4-C5 through C6-C7 with degenerative spinal cord mass effect but no associated cord signal abnormality. Electronically Signed   By: Genevie Ann M.D.   On: 04/28/2019 03:06   Ct Abdomen Pelvis W Contrast  Result Date: 04/28/2019 CLINICAL DATA:  Brain metastasis with unknown primary. EXAM: CT CHEST, ABDOMEN, AND PELVIS WITH CONTRAST TECHNIQUE: Multidetector CT imaging of the chest, abdomen and pelvis was performed following the standard protocol during bolus administration  of intravenous contrast. CONTRAST:  100 cc Omnipaque 300 IV COMPARISON:  No prior chest or abdominal imaging. FINDINGS: CT CHEST FINDINGS Cardiovascular: Thoracic aorta is normal in caliber. Trace atherosclerosis of the aortic arch. Small amount pericardial fluid in the superior pericardial recess. Small coronary artery calcifications. Heart is normal in size. Mediastinum/Nodes: Probable 12 mm right hilar node, contiguous with right upper lobe perihilar mass. No enlarged mediastinal lymph nodes. No left hilar adenopathy. No supraclavicular adenopathy. Visualized thyroid gland is normal. Decompressed esophagus. Lungs/Pleura: Perihilar right upper lobe 5.3 x 4.6 x 3.7 cm pulmonary mass has irregular margins and surrounding ground-glass opacity. This causes mass effect and narrowing of the right upper lobe bronchus. Mass abuts the minor fissure with fissural thickening extending laterally. Mass abuts the major fissure with localized mass effect slight nodular protrusion a possible extension into the lower lobe, for example series 7, image 48. Biapical emphysema with bulla at the left lung apex. No additional pulmonary mass or nodule. Subpleural atelectasis in the dependent left lower lobe. Musculoskeletal: Bone marrow diffusely heterogeneous in density. Lytic lesion with posterior left T3 vertebral body with posterior cortex involvement and extension to the spinal canal. Lytic lesion involving posterior right T1 vertebral body extending into the pedicle. T8 vertebral body lesion may represent a hemangioma but is nonspecific. Lytic lesion within T10 suspicious for metastatic disease. CT ABDOMEN PELVIS FINDINGS Hepatobiliary: Vague 8 mm low-density lesion in the left lobe of the liver, series 3, image 57. Focal fatty infiltration adjacent to the falciform ligament. Gallbladder is unremarkable. No  biliary dilatation. Pancreas: Unremarkable. No pancreatic ductal dilatation or surrounding inflammatory changes. No evidence  of pancreatic mass. Spleen: Normal in size without focal abnormality. Adrenals/Urinary Tract: No adrenal nodule. Left greater than right pelvicaliectasis and prominence of the ureters, likely related to bladder distension. No ureteral stones or cause of obstruction. Urinary bladder is distended without wall thickening. Homogeneous renal enhancement with symmetric excretion on delayed phase imaging. No evidence of focal renal mass. Stomach/Bowel: Stomach is within normal limits. Appendix appears normal. No evidence of bowel wall thickening, distention, or inflammatory changes. No evidence of colonic or small bowel mass. Vascular/Lymphatic: Aorto bi-iliac atherosclerosis. No aneurysm. Patent portal vein. No retroperitoneal adenopathy. No mesenteric or upper abdominal adenopathy. 7 mm right external iliac node is nonspecific. No enlarged inguinal nodes. Reproductive: Heterogeneous enlarged prostate gland spanning 5 cm. This causes mild mass effect on the bladder base. Other: No ascites, free air, or omental nodularity. Small fat containing umbilical hernia. Musculoskeletal: Bone marrow diffusely heterogeneous. Lytic lesion within L4 vertebral body with mild pathologic compression fracture. Lesion extends into the right pedicle. Lytic lesion within anterior L5 vertebral body. Lytic lesion within the right superior pubic ramus extending to the puboacetabular junction. Suspected small central lytic lesion in the sacrum. Suspected small lytic lesion in the left anterior iliac bone and left iliac crest. IMPRESSION: 1. Right upper lobe perihilar mass measuring 5.3 x 4.6 x 3.7 cm consistent with primary bronchogenic malignancy. Mass abuts the major and minor minor fissure with possible extension into the lower lobe. 2. Probable 12 mm right hilar node, contiguous with the right upper lobe perihilar mass. 3. Osseous metastatic disease with multiple lytic lesions in the thoracic and lumbar spine, pelvis and sacrum. 4. Vague 8 mm  low-density lesion in the left lobe of the liver, nonspecific in the setting. 5. Enlarged prostate gland causing mild mass effect on the bladder base. 6. Urinary bladder and bilateral renal collecting system distention may be due to mass effect from enlarged prostate gland. Aortic Atherosclerosis (ICD10-I70.0) and Emphysema (ICD10-J43.9). Electronically Signed   By: Keith Rake M.D.   On: 04/28/2019 03:42   Ct Head Code Stroke Wo Contrast  Result Date: 04/27/2019 CLINICAL DATA:  Code stroke.  Right-sided weakness. EXAM: CT HEAD WITHOUT CONTRAST TECHNIQUE: Contiguous axial images were obtained from the base of the skull through the vertex without intravenous contrast. COMPARISON:  None. FINDINGS: Brain: Rounded low-density masses posteriorly in both frontal lobes near the vertex each measure 1.7 cm, and there is a 2.5 cm low-density mass more anteriorly in the left frontal lobe. There is extensive vasogenic edema in the left greater than right frontal lobes with regional sulcal effacement and slight mass effect on the lateral ventricles without midline shift. A small area of edema is noted in the right occipital lobe without a presumed underlying lesion visible on this unenhanced study. No acute cortically based infarct, intracranial hemorrhage, or extra-axial fluid collection is identified. Vascular: No hyperdense vessel. Skull: No fracture or focal osseous lesion. Sinuses/Orbits: Visualized paranasal sinuses and mastoid air cells are clear. Orbits are unremarkable. Other: None. ASPECTS [facility] Stroke Program Early CT Score) Not scored due to the presence of multiple cerebral masses. IMPRESSION: 1. 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 Brain MRI without and with contrast is recommended for further evaluation. 2. No midline shift or intracranial hemorrhage. These results were called by telephone at the time of interpretation on 04/27/2019 at 6:45  pm to Dr. Ezequiel Essex,  who verbally acknowledged these results. Electronically Signed   By: Logan Bores M.D.   On: 04/27/2019 18:47   Assessment and Plan:  1.  Lung mass, bone lesions, and brain lesions highly suspicious for metastatic lung cancer 2.  Mild normocytic anemia 3.  Leukocytosis/thrombocytosis secondary to steroids 4.  Tobacco dependence  -MRI and CT results discussed with the patient.  We also discussed that his biopsies did not show evidence of cancer.  Discussed with the patient that based on imaging finding, we remain highly suspicious for metastatic cancer, ?  lung primary.  Recommend repeat biopsy.  This is already been discussed with PCCM who plans to see the patient later today.  Further recommendations pending tissue diagnosis. -Continue dexamethasone for vasogenic edema.  Radiation oncology is aware of the patient's admission and will plan to proceed with radiation once tissue diagnosis has been established. -He was counseled about smoking cessation.  Thank you for this referral.   Mikey Bussing, DNP, AGPCNP-BC, AOCNP

## 2019-05-02 NOTE — Progress Notes (Signed)
Inpatient Rehab Admissions:  Inpatient Rehab Consult received.  Per Attending note today, there are plans for possible rebronchoscopy. Will continue to follow medical workup and therapy progression to see if/when pt is appropriate for CIR.   Jhonnie Garner, OTR/L  Rehab Admissions Coordinator  [phone] 05/02/2019 12:32 PM

## 2019-05-02 NOTE — Progress Notes (Signed)
Followed up with pt for spiritual support. Brandon Ferguson said his family just left from visiting him. He said that he feels fine and the Doctor came in to give him un update on his health. He seemed like he was not open to talk much and thanked me for the Chaplain support. I was present with him to offer pastoral support.   Chaplain Resident  Fidel Levy [phone]

## 2019-05-02 NOTE — Progress Notes (Addendum)
PROGRESS NOTE    Brandon Ferguson  AUQ:[id number] DOB: 14-Dec-1960 DOA: 04/27/2019 PCP: Patient, No Pcp Per   Brief Narrative:  Patient is a 58 year old African-American male with no past medical history but smoking for 45 pack years who presented to the emergency department with complaints of right hand weakness that started suddenly when he was working.  He was complaining of feeling episodic numbness on his left arm and right arm for at least a month.  He has not seen a doctor for a while.  He smokes a pack a day since the age of 40.  Drinks 4 beers a day.  In the emergency department, stroke alert was called.  CT head was consistent with metastatic lesions.  MRI imaging showed widespread brain metastasis and cervical vertebral metastatic lesions.  CT chest showed right upper lobe perihilar mass measuring 5.3 x 4.6 x 3.7 cm consistent with primary bronchogenic malignancy.  He underwent bronchogenic biopsy by pulmonary team , but surprisingly report did not show malignancy .  He is currently  at [facility] for starting radiation therapy for brain metastasis.  Currently hemodynamically stable.PT recommended CIR.  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  He underwent bronchoscopy by pulmonology with biopsy of endobronchial lesion on the right upper lobe.  Biopsy report showed benign bronchial mucosa..  Patient seen by neurosurgery, not a surgical candidate.  Currently on dexamethasone and Keppra.  Continue seizure precaution.  Seen by radiation oncology and he has been transferred to [facility] for simulation and starting radiation therapy this Monday. I discussed with Dr Lisbeth Renshaw about the biposy/cytoplogy findings(no malignancy).But his imagings show definite metastatic process.Since , biopsy did not confirm cancer, radiation  oncology will hold radiation therapy until we get definitive diagnosis.  I discussed with oncology Dr. Lorenso Courier today, who will be on board.  We reached to conclusion that there was inadequate sampling, so he needs rebronchoscopy. I discussed with Dr. Gabriel Rung.  Smoker: Nicotine patch continued.  Counseled for cessation  Alcohol abuse: Drinks 4 beers every day.  No evidence of withdrawal.  Continue thiamine, folic acid.  Leukocytosis/hyperglycemia: Secondary to steroid.  We will continue to monitor.  Debility/deconditioning/gait abnormality: Seen by PT and recommended CIR.Consulted rehab MD         DVT prophylaxis:SCD Code Status: Full Family Communication:None at the bedside Disposition Plan: Home after full work-up.     Consultants: None  Procedures: None  Antimicrobials:  Anti-infectives (From admission, onward)   None      Subjective: Patient seen and examined the bedside this morning.  Hemodynamically stable.  Denies any complaints.  He participated with physical therapy yesterday.  Denies any headache, alert and oriented.  Objective: Vitals:   05/01/19 1315 05/01/19 2121 05/02/19 0500 05/02/19 0541  BP: (!) 144/90 (!) 148/86  127/75  Pulse: (!) 107 62  63  Resp: 16 18  19   Temp: 98.3 F (36.8 C) 98.5 F (36.9 C)  97.9 F (36.6 C)  TempSrc: Oral Oral  Oral  SpO2: 98% 99%  97%  Weight:   63.3 kg   Height:        Intake/Output Summary (Last 24 hours) at 05/02/2019 1122 Last data filed at 05/02/2019 0956 Gross per 24 hour  Intake 600 ml  Output 1900 ml  Net -1300 ml   [employer]  04/28/19 0500 04/29/19 0413 05/02/19 0500  Weight: 66.7 kg 68.4 kg 63.3 kg    Examination:  General exam: Appears calm and comfortable ,Not in distress, generalized weakness  HEENT:PERRL,Oral mucosa moist, Ear/Nose normal on gross exam Respiratory system: Bilateral equal air entry, normal vesicular breath sounds, no wheezes or crackles  Cardiovascular system: S1 & S2  heard, RRR. No JVD, murmurs, rubs, gallops or clicks. No pedal edema. Gastrointestinal system: Abdomen is nondistended, soft and nontender. No organomegaly or masses felt. Normal bowel sounds heard. Central nervous system: Alert and oriented. No focal neurological deficits. Extremities: No edema, no clubbing ,no cyanosis, distal peripheral pulses palpable. Skin: No rashes, lesions or ulcers,no icterus ,no pallor      Data Reviewed: I have personally reviewed following labs and imaging studies  CBC: Recent Labs  Lab 04/27/19 1835 05/01/19 0555 05/02/19 0517  WBC 9.9 17.7* 17.8*  NEUTROABS 5.7 14.3* 13.8*  HGB 11.3* 11.9* 12.6*  HCT 35.6* 38.2* 41.0  MCV 92.2 93.4 93.0  PLT 428* 447* 824*   Basic Metabolic Panel: Recent Labs  Lab 04/27/19 1835 04/30/19 0912  NA 135 132*  K 3.4* 4.1  CL 102 97*  CO2 24 26  GLUCOSE 153* 238*  BUN 14 15  CREATININE 0.77 0.65  CALCIUM 9.4 9.7   GFR: Estimated Creatinine Clearance: 89 mL/min (by C-G formula based on SCr of 0.65 mg/dL). Liver Function Tests: Recent Labs  Lab 04/27/19 1835  AST 20  ALT 14  ALKPHOS 100  BILITOT 0.6  PROT 8.4*  ALBUMIN 3.2*   No results for input(s): LIPASE, AMYLASE in the last 168 hours. No results for input(s): AMMONIA in the last 168 hours. Coagulation Profile: Recent Labs  Lab 04/27/19 1835  INR 1.2   Cardiac Enzymes: No results for input(s): CKTOTAL, CKMB, CKMBINDEX, TROPONINI in the last 168 hours. BNP (last 3 results) No results for input(s): PROBNP in the last 8760 hours. HbA1C: No results for input(s): HGBA1C in the last 72 hours. CBG: No results for input(s): GLUCAP in the last 168 hours. Lipid Profile: No results for input(s): CHOL, HDL, LDLCALC, TRIG, CHOLHDL, LDLDIRECT in the last 72 hours. Thyroid Function Tests: No results for input(s): TSH, T4TOTAL, FREET4, T3FREE, THYROIDAB in the last 72 hours. Anemia Panel: No results for input(s): VITAMINB12, FOLATE, FERRITIN, TIBC,  IRON, RETICCTPCT in the last 72 hours. Sepsis Labs: No results for input(s): PROCALCITON, LATICACIDVEN in the last 168 hours.  Recent Results (from the past 240 hour(s))  SARS CORONAVIRUS 2 (TAT 6-24 HRS) Nasopharyngeal Nasopharyngeal Swab     Status: None   Collection Time: 04/27/19  6:57 PM   Specimen: Nasopharyngeal Swab  Result Value Ref Range Status   SARS Coronavirus 2 NEGATIVE NEGATIVE Final    Comment: (NOTE) SARS-CoV-2 target nucleic acids are NOT DETECTED. The SARS-CoV-2 RNA is generally detectable in upper and lower respiratory specimens during the acute phase of infection. Negative results do not preclude SARS-CoV-2 infection, do not rule out co-infections with other pathogens, and should not be used as the sole basis for treatment or other patient management decisions. Negative results must be combined with clinical observations, patient history, and epidemiological information. The expected result is Negative. Fact Sheet for Patients: SugarRoll.be Fact Sheet for Healthcare Providers: https://www.woods-mathews.com/ This test is not yet approved or cleared by the [place] FDA and  has been authorized for detection and/or diagnosis of SARS-CoV-2 by FDA under an Emergency Use Authorization (EUA). This EUA will remain  in effect (meaning this  test can be used) for the duration of the COVID-19 declaration under Section 56 4(b)(1) of the Act, 21 U.S.C. section 360bbb-3(b)(1), unless the authorization is terminated or revoked sooner. Performed at [facility], [place] [address] [place], [place] [address]   MRSA PCR Screening     Status: None   Collection Time: 04/28/19 12:50 AM   Specimen: Nasal Mucosa; Nasopharyngeal  Result Value Ref Range Status   MRSA by PCR NEGATIVE NEGATIVE Final    Comment:        The GeneXpert MRSA Assay (FDA approved for NASAL specimens only), is one component of a comprehensive MRSA  colonization surveillance program. It is not intended to diagnose MRSA infection nor to guide or monitor treatment for MRSA infections. Performed at [facility], [place] [address] [place] [address]          Radiology Studies: No results found.      Scheduled Meds: . dexamethasone  4 mg Oral Q2V  . folic acid  1 mg Oral Daily  . levETIRAcetam  500 mg Oral BID  . nicotine  21 mg Transdermal Daily  . pantoprazole  40 mg Oral Daily  . polyethylene glycol  17 g Oral Daily  . sodium chloride flush  3 mL Intravenous Q12H  . thiamine  100 mg Oral Daily   Continuous Infusions: . sodium chloride 250 mL (04/29/19 2318)     LOS: 5 days    Time spent: 25 mins.More than 50% of that time was spent in counseling and/or coordination of care.      Shelly Coss, MD [facility] Pager [phone]  If 7PM-7AM, please contact night-coverage [email or web address] Password [email or web address] 05/02/2019, 11:22 AM

## 2019-05-03 ENCOUNTER — Ambulatory Visit: Payer: Medicaid Other

## 2019-05-03 ENCOUNTER — Encounter (HOSPITAL_COMMUNITY): Payer: Self-pay | Admitting: Respiratory Therapy

## 2019-05-03 ENCOUNTER — Inpatient Hospital Stay (HOSPITAL_COMMUNITY): Payer: Medicaid Other

## 2019-05-03 ENCOUNTER — Encounter (HOSPITAL_COMMUNITY)
Admission: EM | Disposition: A | Discharge status: Home Health | Payer: Self-pay | Source: Home / Self Care | Attending: Internal Medicine

## 2019-05-03 HISTORY — PX: VIDEO BRONCHOSCOPY: SHX5072

## 2019-05-03 LAB — CBC WITH DIFFERENTIAL/PLATELET
Abs Immature Granulocytes: 0.14 10*3/uL — ABNORMAL HIGH (ref 0.00–0.07)
Basophils Absolute: 0 10*3/uL (ref 0.0–0.1)
Basophils Relative: 0 %
Eosinophils Absolute: 0 10*3/uL (ref 0.0–0.5)
Eosinophils Relative: 0 %
HCT: 39.4 % (ref 39.0–52.0)
Hemoglobin: 12.3 g/dL — ABNORMAL LOW (ref 13.0–17.0)
Immature Granulocytes: 1 %
Lymphocytes Relative: 15 %
Lymphs Abs: 2.3 10*3/uL (ref 0.7–4.0)
MCH: 28.9 pg (ref 26.0–34.0)
MCHC: 31.2 g/dL (ref 30.0–36.0)
MCV: 92.7 fL (ref 80.0–100.0)
Monocytes Absolute: 1.2 10*3/uL — ABNORMAL HIGH (ref 0.1–1.0)
Monocytes Relative: 7 %
Neutro Abs: 12.1 10*3/uL — ABNORMAL HIGH (ref 1.7–7.7)
Neutrophils Relative %: 77 %
Platelets: 403 10*3/uL — ABNORMAL HIGH (ref 150–400)
RBC: 4.25 MIL/uL (ref 4.22–5.81)
RDW: 12.8 % (ref 11.5–15.5)
WBC: 15.8 10*3/uL — ABNORMAL HIGH (ref 4.0–10.5)
nRBC: 0 % (ref 0.0–0.2)

## 2019-05-03 LAB — GLUCOSE, CAPILLARY
Glucose-Capillary: 99 mg/dL (ref 70–99)
Glucose-Capillary: 87 mg/dL (ref 70–99)
Glucose-Capillary: 182 mg/dL — ABNORMAL HIGH (ref 70–99)
Glucose-Capillary: 145 mg/dL — ABNORMAL HIGH (ref 70–99)

## 2019-05-03 LAB — HEMOGLOBIN A1C
Hgb A1c MFr Bld: 5.3 % (ref 4.8–5.6)
Mean Plasma Glucose: 105.41 mg/dL

## 2019-05-03 SURGERY — VIDEO BRONCHOSCOPY WITHOUT FLUORO
Anesthesia: Moderate Sedation | Laterality: Bilateral

## 2019-05-03 MED ORDER — PHENYLEPHRINE HCL 0.25 % NA SOLN
NASAL | Status: DC | PRN
  Administered 2019-05-03: 2 via NASAL

## 2019-05-03 MED ORDER — MIDAZOLAM HCL (PF) 10 MG/2ML IJ SOLN
INTRAMUSCULAR | Status: DC | PRN
  Administered 2019-05-03 (×4): 1 mg via INTRAVENOUS

## 2019-05-03 MED ORDER — MIDAZOLAM HCL (PF) 5 MG/ML IJ SOLN
INTRAMUSCULAR | Status: AC
  Filled 2019-05-03: qty 2

## 2019-05-03 MED ORDER — LIDOCAINE HCL 1 % IJ SOLN
INTRAMUSCULAR | Status: DC | PRN
  Administered 2019-05-03: 6 mL via RESPIRATORY_TRACT

## 2019-05-03 MED ORDER — LIDOCAINE HCL URETHRAL/MUCOSAL 2 % EX GEL
CUTANEOUS | Status: DC | PRN
  Administered 2019-05-03: 1

## 2019-05-03 MED ORDER — SODIUM CHLORIDE 0.9 % IV SOLN
INTRAVENOUS | Status: DC
  Administered 2019-05-03: 08:00:00 via INTRAVENOUS

## 2019-05-03 MED ORDER — FENTANYL CITRATE (PF) 100 MCG/2ML IJ SOLN
INTRAMUSCULAR | Status: DC | PRN
  Administered 2019-05-03 (×5): 50 ug via INTRAVENOUS

## 2019-05-03 MED ORDER — FENTANYL CITRATE (PF) 100 MCG/2ML IJ SOLN
INTRAMUSCULAR | Status: AC
  Filled 2019-05-03: qty 4

## 2019-05-03 MED ORDER — FENTANYL CITRATE (PF) 100 MCG/2ML IJ SOLN
INTRAMUSCULAR | Status: AC
  Filled 2019-05-03: qty 2

## 2019-05-03 NOTE — Progress Notes (Signed)
Inpatient Rehabilitation-Admissions Coordinator   Spoke with pt via phone regarding recommended IP Rehab program. I reviewed program details, expected length of stay, and patient expectations. Pt states he feels fine and wants to go home. He feels he can return home safely with assistance from his sister [place]. With his permission, I contacted [place] to discuss support at [facility] [place]. [place] is not sure she can provide the current amount of assistance at [place] as she works. She plans to discuss rehab options with her brother today and get back to me if he has changed his mind.   Will continue to follow.   Jhonnie Garner, OTR/L  Rehab Admissions Coordinator  [phone] 05/03/2019 11:59 AM

## 2019-05-03 NOTE — Progress Notes (Signed)
Physical Therapy Treatment Patient Details Name: Millie Shorb MRN: [id number] DOB: [birth year] Today's Date: 05/03/2019    History of Present Illness 58 yo male with onset of R UE weakness was admitted and from bronchoscopy found masses in chest and then c-spine, with brain mets.  He is now scheduled for radiation to treat tumors.  Pt is Covid (-).   PMHx:  smoker 45 years, no recent medical care    PT Comments    Pt AxO x 3 and pleasant.  Answering all questions and demonstatred good self awareness. "I just walked to the bathroom".  Assisted OOB.  General bed mobility comments: only required increased time.  General transfer comment: pt more able to self rise from EOB to standing.  General Gait Details: first attempted amb without any AD as prior pt did not use.  Required Mod Assist to prevent LOB to left.  Unsteady wobble gait reaching for doorway to steady self.  Then continued amb with RW.  Pt required less assist at Min/MinGurard with increased ease and improved balance.  Still obsereved L LE weakness with increased L foot drag and decreased stance time.  Very slight but still fall risk/tripping risk.  Reccomend pt amb with walker upon D/C. Pt lives home alone and has 3 steps to enter.  States his GF does 'everything" for him including driving, shopping and house chores.  Pt will need assist at home.  Will update LPT.   Follow Up Recommendations  Home health PT(per chart review CIR declined and pt has no Insurance for SNF)     Equipment Recommendations  Rolling walker with 5" wheels    Recommendations for Other Services       Precautions / Restrictions Precautions Precautions: Fall Precaution Comments: brain mets Restrictions Weight Bearing Restrictions: No    Mobility  Bed Mobility Overal bed mobility: Modified Independent             General bed mobility comments: only required increased time  Transfers Overall transfer level: Needs assistance Equipment used:  Rolling walker (2 wheeled);1 person hand held assist Transfers: Sit to/from Stand Sit to Stand: Supervision;Min guard         General transfer comment: pt more able to self rise from EOB to standing  Ambulation/Gait Ambulation/Gait assistance: Min guard;Min assist;Mod assist Gait Distance (Feet): 115 Feet Assistive device: Rolling walker (2 wheeled);1 person hand held assist Gait Pattern/deviations: Step-through pattern;Narrow base of support;Trunk flexed;Decreased stride length;Shuffle Gait velocity: decreased   General Gait Details: first attempted amb without any AD as prior pt did not use.  Required Mod Assist to prevent LOB to left.  Unsteady wobble gait reaching for doorway to steady self.  Then continued amb with RW.  Pt required less assist at Min/MinGurard with increased ease and improved balance.  Still obsereved L LE weakness with increased L foot drag and decreased stance time.  Very slight but still fall risk/tripping risk.  Reccomend pt amb with walker upon D/C.   Stairs             Wheelchair Mobility    Modified Rankin (Stroke Patients Only)       Balance                                            Cognition Arousal/Alertness: Awake/alert Behavior During Therapy: Flat affect Overall Cognitive Status: Within Functional Limits for tasks  assessed                                 General Comments: AxO x 3 and pleasant.  Answering all questions and demonstatred good self awareness.      Exercises      General Comments        Pertinent Vitals/Pain Pain Assessment: No/denies pain    Home Living                      Prior Function            PT Goals (current goals can now be found in the care plan section) Progress towards PT goals: Progressing toward goals    Frequency    Min 3X/week      PT Plan Current plan remains appropriate    Co-evaluation              AM-PAC PT "6 Clicks"  Mobility   Outcome Measure  Help needed turning from your back to your side while in a flat bed without using bedrails?: None Help needed moving from lying on your back to sitting on the side of a flat bed without using bedrails?: A Little Help needed moving to and from a bed to a chair (including a wheelchair)?: A Little Help needed standing up from a chair using your arms (e.g., wheelchair or bedside chair)?: A Little Help needed to walk in hospital room?: A Little Help needed climbing 3-5 steps with a railing? : A Little 6 Click Score: 19    End of Session Equipment Utilized During Treatment: Gait belt Activity Tolerance: Patient tolerated treatment well Patient left: in bed;with call bell/phone within reach;with bed alarm set;with nursing/sitter in room Nurse Communication: Mobility status PT Visit Diagnosis: Unsteadiness on feet (R26.81);Muscle weakness (generalized) (M62.81);Other abnormalities of gait and mobility (R26.89)     Time: 1547-1600 PT Time Calculation (min) (ACUTE ONLY): 13 min  Charges:  $Gait Training: 8-22 mins                     Rica Koyanagi  PTA Acute  Rehabilitation Services Pager      [phone] Office      [phone]

## 2019-05-03 NOTE — Progress Notes (Signed)
Video Bronchoscope done Intervention Bronchial washing Intervention Bronchial  Biopsy Intervention Bronchial  Brushing  Procedure tolerated well

## 2019-05-03 NOTE — Progress Notes (Signed)
   Name: Brandon Ferguson MRN: [id number] DOB: 1960-07-31    ADMISSION DATE:  04/27/2019 CONSULTATION DATE: 04/28/2019  REFERRING MD : [facility]  CHIEF COMPLAINT: Right-sided weakness incidental finding of brain mets and lung cancer    HISTORY OF PRESENT ILLNESS: 58 year old male who has had 2 episodes of right-sided weakness in the last month.  The last when he was admitted to [facility] and underwent a code stroke with CT of the head which had an incidental finding metastatic brain mets.  CT of the chest revealed multiple findings consistent with bronchogenic primary cancer.  Is also noted to have lytic lesions of the bone and brain.  He is a 1 pack a day smoker since age 57.  He underwent bronchoscopy on 11/20 which showed obstruction of apical segment of right upper lobe.  He was transferred to [facility] long for radiation treatment   Significant tests/ events reviewed  11/19/ 2020 CT of the chest shows a perihilar mass measuring 5.3 x 4.6 x 3.7 cm consistent with bronchogenic malignancy.  The mass abuts the major minor fissure with possible extension into to the lower lobe.  Probable 12 mm right hilar node.  Osseous metastatic disease multiple lytic lesions in the thoracic lumbar spine pelvis and sacrum.  Vague 8 mm low-density lesion left lower lobe of the liver  04/28/2019 MRI of the brain shows widespread metastatic disease to the brain.  Approximately 20 to individual brain metastases.   SUBJECTIVE:  Denies chest pain or dyspnea Afebrile last 24 hours   VITAL SIGNS: Temp:  [97.8 F (36.6 C)-98.8 F (37.1 C)] 98.8 F (37.1 C) (11/24 0735) Pulse Rate:  [67-70] 67 (11/24 0735) Resp:  [11-23] 17 (11/24 0915) BP: (112-161)/(74-121) 145/85 (11/24 0910) SpO2:  [93 %-100 %] 96 % (11/24 0915)  PHYSICAL EXAMINATION: General: Thin black male in no acute distress Neuro: Right side power 4/5, left 5/5 HEENT: No JVD lymphadenopathy, mild pallor, no icterus  Cardiovascular: Heart sounds are regular regular rate and rhythm Lungs: Decreased breath sounds bilateral Abdomen: Soft nontender positive bowel sounds Musculoskeletal: No deformity Skin: Warm and dry  Recent Labs  Lab 04/27/19 1835 04/30/19 0912  NA 135 132*  K 3.4* 4.1  CL 102 97*  CO2 24 26  BUN 14 15  CREATININE 0.77 0.65  GLUCOSE 153* 238*   Recent Labs  Lab 05/01/19 0555 05/02/19 0517 05/03/19 0600  HGB 11.9* 12.6* 12.3*  HCT 38.2* 41.0 39.4  WBC 17.7* 17.8* 15.8*  PLT 447* 452* 403*   No results found.  ASSESSMENT Nondiagnostic bronchoscopy on 11/20 but imaging very convincing for primary lung malignancy with mets to the brain Repeat bronchoscopy 11/24 showed friable mass obstructing apical subsegment of right upper lobe    PLAN: Await endobronchial biopsies, and brushings. Can proceed with radiation to brain meantime  Kara Mead MD. [facility]. [place] [facility]   05/03/2019

## 2019-05-03 NOTE — TOC Initial Note (Addendum)
Transition of Care (TOC) - Initial/Assessment Note    Patient Details  Name: Brandon Ferguson MRN: [id number] Date of Birth: 01/24/1961  Transition of Care (TOC) CM/SW Contact:    CLEMENTS, NORA H, RN Phone Number: 05/03/2019, 3:46 PM   This CM met with pt at bedside about DC planning. It was explained to pt that due to him not having insurance he would not be able to go to SNF. At this time CIR has declined pt. Pt states that his girlfriend can stay with him full time when he discharges from the hospital (Smith, Shenniel [phone]). Pt is requesting a RW for dc. This CM will contact [employer] for charity RW. [facility] also contacted for charity home health services.  Activities of Daily Living Home Assistive Devices/Equipment: None ADL Screening (condition at time of admission) Patient's cognitive ability adequate to safely complete daily activities?: No Is the patient deaf or have difficulty hearing?: Yes(-Right ear better.) Does the patient have difficulty seeing, even when wearing glasses/contacts?: Yes Does the patient have difficulty concentrating, remembering, or making decisions?: No Patient able to express need for assistance with ADLs?: No Does the patient have difficulty dressing or bathing?: No Independently performs ADLs?: Yes (appropriate for developmental age) Does the patient have difficulty walking or climbing stairs?: Yes Weakness of Legs: Right Weakness of Arms/Hands: Right  Admission diagnosis:  Brain metastases (HCC) [C79.31] Right sided weakness [R53.1] Patient Active Problem List   Diagnosis Date Noted  . Malignant neoplasm of upper lobe of right lung (HCC)   . Lung mass 04/28/2019  . Metastatic cancer to brain (HCC) 04/27/2019  . Hypokalemia 04/27/2019   PCP:  Patient, No Pcp Per Pharmacy:   [id number] - [place], [place] - [address] [place] [address] Phone: [phone] Fax: [phone]     Social  Determinants of Health (SDOH) Interventions    Readmission Risk Interventions No flowsheet data found.  

## 2019-05-03 NOTE — Progress Notes (Signed)
PROGRESS NOTE    Brandon Ferguson  ZJI:[id number] DOB: January 08, 1961 DOA: 04/27/2019 PCP: Patient, No Pcp Per   Brief Narrative:  Patient is a 58 year old African-American male with no past medical history but smoking for 45 pack years who presented to the emergency department with complaints of right hand weakness that started suddenly when he was working.  He was complaining of feeling episodic numbness on his left arm and right arm for at least a month.  He has not seen a doctor for a while.  He smokes a pack a day since the age of 43.  Drinks 4 beers a day.  In the emergency department, stroke alert was called.  CT head was consistent with metastatic lesions.  MRI imaging showed widespread brain metastasis and cervical vertebral metastatic lesions.  CT chest showed right upper lobe perihilar mass measuring 5.3 x 4.6 x 3.7 cm consistent with primary bronchogenic malignancy.  He underwent bronchogenic biopsy by pulmonary team , but surprisingly report did not show malignancy .  He is currently  at [facility] for starting radiation therapy for brain metastasis.  Underwent rebronchoscopy on 05/04/2019.  Currently hemodynamically stable.PT recommended CIR but CIR denied.  He does not have insurance for skilled nursing. Palliative care consulted.    Assessment & Plan:   Principal Problem:   Metastatic cancer to brain [facility]) Active Problems:   Hypokalemia   Malignant neoplasm of upper lobe of right lung ([place])   Suspected Metastatic bronchogenic carcinoma with mets to the brain, spine:   He MRI imaging showed widespread brain metastasis and cervical vertebral metastatic lesions.underwent bronchoscopy by pulmonology with biopsy of endobronchial lesion on the right upper lobe.  Biopsy report showed benign bronchial mucosa..  Patient seen by neurosurgery, not a surgical candidate.  Currently on dexamethasone and Keppra.  Continue seizure precaution.  Seen by radiation oncology and he has been transferred  to [facility] for simulation and starting radiation therapy . I discussed with Dr Lisbeth Renshaw about the biposy/cytoplogy findings(no malignancy).But his imagings show definite metastatic process.Since , biopsy did not confirm cancer, radiation oncology will hold radiation therapy until we get definitive diagnosis. We discussed with oncology Dr. Chapman Moss following.  We reached to conclusion that there was inadequate sampling, so he underwent rebronchoscopy. We will follow up biopsy report. We have also requested palliative care evaluation.  Smoker: Nicotine patch continued.  Counseled for cessation  Alcohol abuse: Drinks 4 beers every day.  No evidence of withdrawal.  Continue thiamine, folic acid.  Leukocytosis/hyperglycemia: Secondary to steroid.  We will continue to monitor.  Debility/deconditioning/gait abnormality: Seen by PT and recommended CIR.but CIR denied.  He does not have insurance for skilled nursing.  I talked with the sister on phone today.  Patient lives by himself.  Sister was not sure if they can provide 24-hour care at home.  I have requested case manager to look into this.          DVT prophylaxis:SCD Code Status: Full Family Communication:Called sister on phone on 05/03/19 Disposition Plan: Home with home health after full work-up.  Palliative care consult pending   Consultants: None  Procedures: None  Antimicrobials:  Anti-infectives (From admission, onward)   None      Subjective: Patient seen and examined the bedside this morning.  Hemodynamically stable.  Alert and awake.Denies any complains  Objective: Vitals:   05/03/19 0905 05/03/19 0910 05/03/19 0915 05/03/19 1335  BP: 130/78 (!) 145/85  135/80  Pulse:    66  Resp:  16 (!) 21 17 18   Temp:    97.9 F (36.6 C)  TempSrc:    Oral  SpO2: 96% 95% 96% 100%  Weight:      Height:        Intake/Output Summary (Last 24 hours) at 05/03/2019 1410 Last data filed at 05/03/2019 1334 Gross  per 24 hour  Intake 1210 ml  Output 1775 ml  Net -565 ml   Filed Weights   04/28/19 0500 04/29/19 0413 05/02/19 0500  Weight: 66.7 kg 68.4 kg 63.3 kg    Examination:  General exam: Appears calm and comfortable ,Not in distress, generalized weakness  HEENT:PERRL,Oral mucosa moist, Ear/Nose normal on gross exam Respiratory system: Bilateral equal air entry, normal vesicular breath sounds, no wheezes or crackles  Cardiovascular system: S1 & S2 heard, RRR. No JVD, murmurs, rubs, gallops or clicks. No pedal edema. Gastrointestinal system: Abdomen is nondistended, soft and nontender. No organomegaly or masses felt. Normal bowel sounds heard. Central nervous system: Alert and oriented. No focal neurological deficits. Extremities: No edema, no clubbing ,no cyanosis, distal peripheral pulses palpable. Skin: No rashes, lesions or ulcers,no icterus ,no pallor      Data Reviewed: I have personally reviewed following labs and imaging studies  CBC: Recent Labs  Lab 04/27/19 1835 05/01/19 0555 05/02/19 0517 05/03/19 0600  WBC 9.9 17.7* 17.8* 15.8*  NEUTROABS 5.7 14.3* 13.8* 12.1*  HGB 11.3* 11.9* 12.6* 12.3*  HCT 35.6* 38.2* 41.0 39.4  MCV 92.2 93.4 93.0 92.7  PLT 428* 447* 452* 235*   Basic Metabolic Panel: Recent Labs  Lab 04/27/19 1835 04/30/19 0912  NA 135 132*  K 3.4* 4.1  CL 102 97*  CO2 24 26  GLUCOSE 153* 238*  BUN 14 15  CREATININE 0.77 0.65  CALCIUM 9.4 9.7   GFR: Estimated Creatinine Clearance: 89 mL/min (by C-G formula based on SCr of 0.65 mg/dL). Liver Function Tests: Recent Labs  Lab 04/27/19 1835  AST 20  ALT 14  ALKPHOS 100  BILITOT 0.6  PROT 8.4*  ALBUMIN 3.2*   No results for input(s): LIPASE, AMYLASE in the last 168 hours. No results for input(s): AMMONIA in the last 168 hours. Coagulation Profile: Recent Labs  Lab 04/27/19 1835  INR 1.2   Cardiac Enzymes: No results for input(s): CKTOTAL, CKMB, CKMBINDEX, TROPONINI in the last 168  hours. BNP (last 3 results) No results for input(s): PROBNP in the last 8760 hours. HbA1C: Recent Labs    05/03/19 0600  HGBA1C 5.3   CBG: Recent Labs  Lab 05/02/19 1209 05/02/19 1623 05/02/19 2135 05/03/19 0933 05/03/19 1151  GLUCAP 130* 105* 157* 99 87   Lipid Profile: No results for input(s): CHOL, HDL, LDLCALC, TRIG, CHOLHDL, LDLDIRECT in the last 72 hours. Thyroid Function Tests: No results for input(s): TSH, T4TOTAL, FREET4, T3FREE, THYROIDAB in the last 72 hours. Anemia Panel: No results for input(s): VITAMINB12, FOLATE, FERRITIN, TIBC, IRON, RETICCTPCT in the last 72 hours. Sepsis Labs: No results for input(s): PROCALCITON, LATICACIDVEN in the last 168 hours.  Recent Results (from the past 240 hour(s))  SARS CORONAVIRUS 2 (TAT 6-24 HRS) Nasopharyngeal Nasopharyngeal Swab     Status: None   Collection Time: 04/27/19  6:57 PM   Specimen: Nasopharyngeal Swab  Result Value Ref Range Status   SARS Coronavirus 2 NEGATIVE NEGATIVE Final    Comment: (NOTE) SARS-CoV-2 target nucleic acids are NOT DETECTED. The SARS-CoV-2 RNA is generally detectable in upper and lower respiratory specimens during the acute phase of infection. Negative  results do not preclude SARS-CoV-2 infection, do not rule out co-infections with other pathogens, and should not be used as the sole basis for treatment or other patient management decisions. Negative results must be combined with clinical observations, patient history, and epidemiological information. The expected result is Negative. Fact Sheet for Patients: SugarRoll.be Fact Sheet for Healthcare Providers: https://www.woods-mathews.com/ This test is not yet approved or cleared by the [place] FDA and  has been authorized for detection and/or diagnosis of SARS-CoV-2 by FDA under an Emergency Use Authorization (EUA). This EUA will remain  in effect (meaning this test can be used) for the  duration of the COVID-19 declaration under Section 56 4(b)(1) of the Act, 21 U.S.C. section 360bbb-3(b)(1), unless the authorization is terminated or revoked sooner. Performed at [facility], [place] [address], [place], [place] [address]   MRSA PCR Screening     Status: None   Collection Time: 04/28/19 12:50 AM   Specimen: Nasal Mucosa; Nasopharyngeal  Result Value Ref Range Status   MRSA by PCR NEGATIVE NEGATIVE Final    Comment:        The GeneXpert MRSA Assay (FDA approved for NASAL specimens only), is one component of a comprehensive MRSA colonization surveillance program. It is not intended to diagnose MRSA infection nor to guide or monitor treatment for MRSA infections. Performed at [facility], [place] [address] [place], [place] [address]          Radiology Studies: No results found.      Scheduled Meds:  dexamethasone  4 mg Oral T2P   folic acid  1 mg Oral Daily   insulin aspart  0-9 Units Subcutaneous TID WC   levETIRAcetam  500 mg Oral BID   nicotine  21 mg Transdermal Daily   pantoprazole  40 mg Oral Daily   polyethylene glycol  17 g Oral Daily   sodium chloride flush  3 mL Intravenous Q12H   thiamine  100 mg Oral Daily   Continuous Infusions:  sodium chloride 250 mL (04/29/19 2318)   sodium chloride 10 mL/hr at 05/03/19 0742     LOS: 6 days    Time spent: 25 mins.More than 50% of that time was spent in counseling and/or coordination of care.      Shelly Coss, MD [facility] Pager [phone]  If 7PM-7AM, please contact night-coverage [email or web address] Password [email or web address] 05/03/2019, 2:10 PM

## 2019-05-03 NOTE — Op Note (Signed)
Indication : RUL lung mass with metastatic brain lesions in this ex smoker. Prior bronchoscopy was non diagnostic , no other targets identified on imaging hence repeat bronchoscopy planned for endobronchial mass noted RUL  Written informed consent was obtained prior to the procedure. The risks of the procedure including coughing, bleeding and the small chance of lung puncture requiring chest tube were discussed in great detail. The benefits & alternatives including serial follow up were also discussed.   4 mg versed & 250  mcg fentnayl used in divided doses during the procedure. Total time for consciou sedation 08 09 to 0830 >> 21 mins  Bronchoscope entered from the right nare. Upper airway nml Vocal cords showed nml appearance & motion. Trachea & bronchial tree examined to the subsegmental level. Mild amount of white secretions were noted. Large friable endobronchial lesion seen blocking take off of apical segment of RUL. Endo bronchial biopsies x 4 were obtained from the RUL. Cold saline was used for hemostasis each time. BAL was also obtained from the RUL. Brushings x 2 were obtained Hemostasis was ensured prior to withdrawing the scope.  Specimens  -Endobronchial biopsies for path Brushings & BAL for cytology -BAL for afb/ fungus  Brandon Sara V.  [phone]

## 2019-05-03 NOTE — Progress Notes (Signed)
Inpatient Rehabilitation-Admissions Coordinator   Discussed case with PM&R MD Dr. Dagoberto Ligas. Pt is not an appropriate candidate for CIR. Pt does not have a diagnosis amenable to rehab. After my phone conversation with the patient, I feel he has deficits in the area of safety awareness and thus will need 24/7 S at DC. After speaking with his sister [place], the patient does not have that type of support at home.   Recommendation is for SNF placement.   This AC will sign off.   Please call if questions.   Jhonnie Garner, OTR/L  Rehab Admissions Coordinator  [phone] 05/03/2019 1:41 PM

## 2019-05-04 ENCOUNTER — Encounter (HOSPITAL_COMMUNITY): Payer: Self-pay | Admitting: Pulmonary Disease

## 2019-05-04 ENCOUNTER — Encounter: Payer: Self-pay | Admitting: General Practice

## 2019-05-04 ENCOUNTER — Ambulatory Visit
Admission: RE | Admit: 2019-05-04 | Discharge: 2019-05-04 | Disposition: A | Discharge status: Home / Self Care | Payer: Medicaid Other | Source: Ambulatory Visit | Attending: Radiation Oncology | Admitting: Radiation Oncology

## 2019-05-04 ENCOUNTER — Ambulatory Visit: Payer: Medicaid Other

## 2019-05-04 ENCOUNTER — Other Ambulatory Visit: Payer: Self-pay | Admitting: Oncology

## 2019-05-04 DIAGNOSIS — Z7189 Other specified counseling: Secondary | ICD-10-CM

## 2019-05-04 DIAGNOSIS — Z515 Encounter for palliative care: Secondary | ICD-10-CM

## 2019-05-04 DIAGNOSIS — C7931 Secondary malignant neoplasm of brain: Secondary | ICD-10-CM

## 2019-05-04 LAB — CBC WITH DIFFERENTIAL/PLATELET
Abs Immature Granulocytes: 0.12 10*3/uL — ABNORMAL HIGH (ref 0.00–0.07)
Basophils Absolute: 0 10*3/uL (ref 0.0–0.1)
Basophils Relative: 0 %
Eosinophils Absolute: 0 10*3/uL (ref 0.0–0.5)
Eosinophils Relative: 0 %
HCT: 39.8 % (ref 39.0–52.0)
Hemoglobin: 12.6 g/dL — ABNORMAL LOW (ref 13.0–17.0)
Immature Granulocytes: 1 %
Lymphocytes Relative: 14 %
Lymphs Abs: 2.1 10*3/uL (ref 0.7–4.0)
MCH: 29.4 pg (ref 26.0–34.0)
MCHC: 31.7 g/dL (ref 30.0–36.0)
MCV: 92.8 fL (ref 80.0–100.0)
Monocytes Absolute: 0.9 10*3/uL (ref 0.1–1.0)
Monocytes Relative: 6 %
Neutro Abs: 11.6 10*3/uL — ABNORMAL HIGH (ref 1.7–7.7)
Neutrophils Relative %: 79 %
Platelets: 360 10*3/uL (ref 150–400)
RBC: 4.29 MIL/uL (ref 4.22–5.81)
RDW: 13.1 % (ref 11.5–15.5)
WBC: 14.8 10*3/uL — ABNORMAL HIGH (ref 4.0–10.5)
nRBC: 0 % (ref 0.0–0.2)

## 2019-05-04 LAB — ACID FAST SMEAR (AFB, MYCOBACTERIA): Acid Fast Smear: NEGATIVE

## 2019-05-04 LAB — BASIC METABOLIC PANEL
Anion gap: 8 (ref 5–15)
BUN: 19 mg/dL (ref 6–20)
CO2: 24 mmol/L (ref 22–32)
Calcium: 9.6 mg/dL (ref 8.9–10.3)
Chloride: 98 mmol/L (ref 98–111)
Creatinine, Ser: 0.47 mg/dL — ABNORMAL LOW (ref 0.61–1.24)
GFR calc Af Amer: >60 mL/min (ref >60)
GFR calc non Af Amer: >60 mL/min (ref >60)
Glucose, Bld: 154 mg/dL — ABNORMAL HIGH (ref 70–99)
Potassium: 4.1 mmol/L (ref 3.5–5.1)
Sodium: 130 mmol/L — ABNORMAL LOW (ref 135–145)

## 2019-05-04 LAB — SURGICAL PATHOLOGY

## 2019-05-04 LAB — GLUCOSE, CAPILLARY
Glucose-Capillary: 122 mg/dL — ABNORMAL HIGH (ref 70–99)
Glucose-Capillary: 186 mg/dL — ABNORMAL HIGH (ref 70–99)
Glucose-Capillary: 136 mg/dL — ABNORMAL HIGH (ref 70–99)

## 2019-05-04 LAB — CYTOLOGY - NON PAP

## 2019-05-04 MED ORDER — LEVETIRACETAM 500 MG PO TABS: 500.0000 mg | ORAL_TABLET | Freq: Two times a day (BID) | ORAL | 0 refills | Status: DC

## 2019-05-04 MED ORDER — THIAMINE HCL 100 MG PO TABS: 100.0000 mg | ORAL_TABLET | Freq: Every day | ORAL | 0 refills | Status: DC

## 2019-05-04 MED ORDER — PANTOPRAZOLE SODIUM 40 MG PO TBEC: 40.0000 mg | DELAYED_RELEASE_TABLET | Freq: Every day | ORAL | 0 refills | Status: DC

## 2019-05-04 MED ORDER — DEXAMETHASONE 4 MG PO TABS: 4.0000 mg | ORAL_TABLET | Freq: Two times a day (BID) | ORAL | 0 refills | Status: DC

## 2019-05-04 MED ORDER — FOLIC ACID 1 MG PO TABS: 1.0000 mg | ORAL_TABLET | Freq: Every day | ORAL | 0 refills | Status: DC

## 2019-05-04 MED ORDER — POLYETHYLENE GLYCOL 3350 17 G PO PACK: 17.0000 g | PACK | Freq: Every day | ORAL | 0 refills | Status: DC | PRN

## 2019-05-04 NOTE — Progress Notes (Signed)
Occupational Therapy Treatment Patient Details Name: Brandon Ferguson MRN: [id number] DOB: 19-Dec-1960 Today's Date: 05/04/2019    History of present illness 58 yo male with onset of R UE weakness was admitted and from bronchoscopy found masses in chest and then c-spine, with brain mets.  He is now scheduled for radiation to treat tumors.  Pt is Covid (-).   PMHx:  smoker 45 years, no recent medical care   OT comments  Pt making good progress with functional goals. Pt participated in ambulating to bathroom with RW for toilet transfers and hygiene at sink. Pt ambulated to recliner with HHA. OT will continue to follow acutely  Follow Up Recommendations  Home health OT;Supervision/Assistance - 24 hour(CIR denied and no insurance for SNF)    Equipment Recommendations  3 in 1 bedside commode;Tub/shower seat    Recommendations for Other Services      Precautions / Restrictions Precautions Precautions: Fall Precaution Comments: brain mets Restrictions Weight Bearing Restrictions: No       Mobility Bed Mobility Overal bed mobility: Modified Independent Bed Mobility: Supine to Sit     Supine to sit: Mod I     General bed mobility comments: only required increased time  Transfers Overall transfer level: Needs assistance Equipment used: Rolling walker (2 wheeled);1 person hand held assist Transfers: Sit to/from Stand Sit to Stand: Supervision;Min guard              Balance Overall balance assessment: Needs assistance Sitting-balance support: Bilateral upper extremity supported;Feet supported Sitting balance-Leahy Scale: Fair     Standing balance support: Bilateral upper extremity supported;During functional activity Standing balance-Leahy Scale: Poor                             ADL either performed or assessed with clinical judgement   ADL Overall ADL's : Needs assistance/impaired Eating/Feeding: Independent;Sitting   Grooming: Wash/dry hands;Wash/dry  face;Min guard;Standing               Lower Body Dressing: Minimal assistance;Sit to/from stand;Cueing for safety;Cueing for sequencing   Toilet Transfer: Min guard;Supervision/safety;Ambulation;RW;Regular Toilet;Grab bars   Toileting- Clothing Manipulation and Hygiene: Min guard;Sit to/from stand       Functional mobility during ADLs: Min guard;Supervision/safety       Vision Patient Visual Report: No change from baseline     Perception     Praxis      Cognition Arousal/Alertness: Awake/alert Behavior During Therapy: Flat affect Overall Cognitive Status: Within Functional Limits for tasks assessed                                          Exercises     Shoulder Instructions       General Comments      Pertinent Vitals/ Pain       Pain Assessment: No/denies pain  Home Living                                          Prior Functioning/Environment              Frequency  Min 2X/week        Progress Toward Goals  OT Goals(current goals can now be found in the care plan section)  Progress towards OT  goals: Progressing toward goals  Acute Rehab OT Goals Patient Stated Goal: to get home with family and his significant other  Plan Discharge plan needs to be updated    Co-evaluation                 AM-PAC OT "6 Clicks" Daily Activity     Outcome Measure   Help from another person eating meals?: None Help from another person taking care of personal grooming?: A Little Help from another person toileting, which includes using toliet, bedpan, or urinal?: A Little Help from another person bathing (including washing, rinsing, drying)?: A Little Help from another person to put on and taking off regular upper body clothing?: A Little Help from another person to put on and taking off regular lower body clothing?: A Lot 6 Click Score: 18    End of Session Equipment Utilized During Treatment: Gait belt;Rolling  walker  OT Visit Diagnosis: Unsteadiness on feet (R26.81);Other abnormalities of gait and mobility (R26.89);Muscle weakness (generalized) (M62.81)   Activity Tolerance Patient tolerated treatment well   Patient Left in chair;with call bell/phone within reach;with chair alarm set;with family/visitor present   Nurse Communication          Time: 6468-0321 OT Time Calculation (min): 17 min  Charges: OT General Charges $OT Visit: 1 Visit OT Treatments $Self Care/Home Management : 8-22 mins     Britt Bottom 05/04/2019, 12:18 PM

## 2019-05-04 NOTE — Consult Note (Signed)
Consultation Note Date: 05/04/2019   Patient Name: Brandon Ferguson  DOB: 1960/12/07  MRN: [id number]  Age / Sex: 58 y.o., male  PCP: Brandon Ferguson Referring Physician: Shelly Coss, MD  Reason for Consultation: Establishing goals of care  HPI/Patient Profile: 58 y.o. male admitted on 04/27/2019   Clinical Assessment and Goals of Care: Brandon Ferguson is 58 years old.  He lives in [place], [place].  He started having right sided weakness and arrived at the hospital.  CT scan of the head shows metastatic brain mets.  CT scan of the chest revealed right upper lobe lesions consistent with bronchogenic primary cancer, lytic lesions of Brandon and brain.  Patient has a history of 1 pack/day smoking since age of 10.  Patient has been seen and evaluated by medical oncology, pulmonary critical care medicine, radiation oncology and neurosurgery in this hospitalization.  Additionally, he has also been evaluated by inpatient rehab.  Spiritual services have been following.  Repeat bronchoscopy was undertaken.  Final biopsies are pending.  An early palliative consultation has been requested for symptom management, advanced care planning and broad goals of care discussions.  Patient is awake alert sitting up in a chair.  His [place] Ferguson is present at the bedside.  I introduced myself and palliative care as follows:  Palliative medicine is specialized medical care for people living with serious illness. It focuses on providing relief from the symptoms and stress of a serious illness. The goal is to improve quality of life for both the patient and the family.  Goals of care: Broad aims of medical therapy in relation to the patient's values and preferences. Our aim is to provide medical care aimed at enabling patients to achieve the goals that matter most to them, given the circumstances of their particular medical  situation and their constraints.   Patient Brandon Ferguson has several questions about the type of cancer the patient has, additional work-up which is still pending, proposed plan of care from an oncology standpoint.  I attempted to answer her questions to the best of my ability.  See additional discussions as listed in the summary of recommendations below.  Thank you for the consult.  NEXT OF KIN Brandon Ferguson, she states she lives in [place] where the patient also resides.  She is present at the bedside today.  SUMMARY OF RECOMMENDATIONS   Full code/full scope treatment Home with home-based health care, home-based physical therapy. Medical oncology follow-up near the patient's home in [place], [place] at [facility] oncology No acute symptoms continue current mode of care Brandon Ferguson is next of kin. Code Status/Advance Care Planning:  Full code    Symptom Management:   Continue current mode of care  Palliative Prophylaxis:   Delirium Protocol  Additional Recommendations (Limitations, Scope, Preferences):  Full Scope Treatment  Psycho-social/Spiritual:   Desire for further Chaplaincy support:yes  Additional Recommendations: Caregiving  Support/Resources  Prognosis:   Unable to determine  Discharge Planning: Home with Home Health      Primary Diagnoses: Present  on Admission: . Metastatic cancer to brain ([place]) . Hypokalemia   I have reviewed the medical record, interviewed the patient and family, and examined the patient. The following aspects are pertinent.  Past Medical History:  Diagnosis Date  . Iron deficiency   . Vitamin D deficiency    Social History   Socioeconomic History  . Marital status: Single    Spouse name: Not on file  . Number of children: Not on file  . Years of education: Not on file  . Highest education level: Not on file  Occupational History  . Not on file  Social Needs  . Financial resource strain: Not on file  . Food  insecurity    Worry: Not on file    Inability: Not on file  . Transportation needs    Medical: Not on file    Non-medical: Not on file  Tobacco Use  . Smoking status: Current Every Day Smoker    Packs/day: 1.00    Types: Cigarettes  . Smokeless tobacco: Never Used  Substance and Sexual Activity  . Alcohol use: Yes    Alcohol/week: 6.0 standard drinks    Types: 6 Cans of beer Ferguson week    Comment: pt drinks 3 large beers daily  . Drug use: Yes    Types: Marijuana    Comment: daily  . Sexual activity: Not on file  Lifestyle  . Physical activity    Days Ferguson week: Not on file    Minutes Ferguson session: Not on file  . Stress: Not on file  Relationships  . Social Herbalist on phone: Not on file    Gets together: Not on file    Attends religious service: Not on file    Active member of club or organization: Not on file    Attends meetings of clubs or organizations: Not on file    Relationship status: Not on file  Other Topics Concern  . Not on file  Social History Narrative  . Not on file   Family History  Problem Relation Age of Onset  . Breast cancer Mother   . Cirrhosis Father   . Alcohol abuse Father    Scheduled Meds: . dexamethasone  4 mg Oral Q2V  . folic acid  1 mg Oral Daily  . insulin aspart  0-9 Units Subcutaneous TID WC  . levETIRAcetam  500 mg Oral BID  . nicotine  21 mg Transdermal Daily  . pantoprazole  40 mg Oral Daily  . polyethylene glycol  17 g Oral Daily  . sodium chloride flush  3 mL Intravenous Q12H  . thiamine  100 mg Oral Daily   Continuous Infusions: . sodium chloride 250 mL (04/29/19 2318)  . sodium chloride 10 mL/hr at 05/03/19 0742   PRN Meds:.sodium chloride, acetaminophen **OR** acetaminophen, iohexol, LORazepam, sodium chloride flush Medications Prior to Admission:  Prior to Admission medications   Medication Sig Start Date End Date Taking? Authorizing Provider  acetaminophen (TYLENOL) 500 MG tablet Take 500 mg by mouth  every 6 (six) hours as needed for headache.   Yes [provider]  ferrous sulfate 325 (65 FE) MG EC tablet Take 325 mg by mouth daily.   Yes [provider]  meloxicam (MOBIC) 15 MG tablet Take 15 mg by mouth daily. 04/25/19  Yes [provider]  naproxen sodium (ALEVE) 220 MG tablet Take 220 mg by mouth 2 (two) times daily as needed (for headache).   Yes [provider]   No Known Allergies Review of Systems Denies pain.   Physical Exam Thin appearing gentleman sitting up in a chair S1-S2 Lungs diminished breath sounds Mild generalized weakness No edema Some muscle wasting Abdomen is not distended Vital Signs: BP 121/69 (BP Location: Left Arm)   Pulse (!) 59   Temp 98 F (36.7 C) (Oral)   Resp 16   Ht 6' (1.829 m)   Wt 62.9 kg   SpO2 98%   BMI 18.81 kg/m  Pain Scale: 0-10   Pain Score: 0-No pain   SpO2: SpO2: 98 % O2 Device:SpO2: 98 % O2 Flow Rate: .O2 Flow Rate (L/min): 2 L/min  IO: Intake/output summary:   Intake/Output Summary (Last 24 hours) at 05/04/2019 1402 Last data filed at 05/04/2019 1300 [place] 24 hour  Intake 960 ml  Output 1626 ml  Net -666 ml    LBM: Last BM Date: 05/03/19 Baseline Weight: Weight: 74.8 kg Most recent weight: Weight: 62.9 kg     Palliative Assessment/Data:   PPS 50%  Time In:  1300 Time Out:  1400 Time Total:  60 min.  Greater than 50%  of this time was spent counseling and coordinating care related to the above assessment and plan.  Signed by: Loistine Chance, MD   Please contact Palliative Medicine Team phone at [phone] for questions and concerns.  For individual provider: See Shea Evans

## 2019-05-04 NOTE — Discharge Summary (Signed)
Physician Discharge Summary  Brandon Ferguson PJK:[id number] DOB: 1961-05-20 DOA: 04/27/2019  PCP: Patient, No Pcp Per  Admit date: 04/27/2019 Discharge date: 05/04/2019  Admitted From: Home Disposition:  Home  Discharge Condition:Stable CODE STATUS:FULL Diet recommendation:  Regular  Brief/Interim Summary:  Patient is a 58 year old African-American male with no past medical history but smoking for 45 pack years who presented to the emergency department with complaints of right hand weakness that started suddenly when he was working.  He was complaining of feeling episodic numbness on his left arm and right arm for at least a month.  He has not seen a doctor for a while.  He smokes a pack a day since the age of 38.  Drinks 4 beers a day.  In the emergency department, stroke alert was called.  CT head was consistent with metastatic lesions.  MRI imaging showed widespread brain metastasis and cervical vertebral metastatic lesions.  CT chest showed right upper lobe perihilar mass measuring 5.3 x 4.6 x 3.7 cm consistent with primary bronchogenic malignancy.  He underwent bronchogenic biopsy by pulmonary team , but surprisingly report did not show malignancy .  He is currently  at [facility] for starting radiation therapy for brain metastasis.  Underwent rebronchoscopy on 05/04/2019 which also did not show any tissue diagnosis.  He has been recommended to follow-up with IR as an outpatient for biopsy of the metastatic lesion in the bone.  Appointment with radiation oncology and oncology has been set up.  Physical therapy recommended home health for him. Currently hemodynamically stable.  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  Biopsy report showed benign bronchial mucosa..  Patient seen by neurosurgery, not a surgical candidate.  Currently on dexamethasone and Keppra.   Seen by radiation oncology and he was transferred to [facility] for simulation and starting radiation therapy . Underwent bronchoscopy twice here without any clear tissue diagnosis.  He has been started on radiation therapy by radiation oncology.  Oncology recommended to follow-up as an outpatient.  Appointment has been set up.  Oncology also recommending to follow-up with IR as an outpatient for biopsy of the metastatic lesion on the bone for definitive tissue diagnosis. He will follow-up with radiation therapy for the remaining radiation treatments. Palliative care was also consulted during this hospitalization.  Smoker: Nicotine patch ordered.  Counseled for cessation  Alcohol abuse: Drinks 4 beers every day.  No evidence of withdrawal.  Continue thiamine, folic acid.  Leukocytosis/hyperglycemia: Secondary to steroids. Check CBC in a week.  Debility/deconditioning/gait abnormality: Seen by PT and recommended  HHPT.  Discharge Diagnoses:  Principal Problem:   Metastatic cancer to brain [facility]) Active Problems:   Hypokalemia   Malignant neoplasm of upper lobe of right lung [facility])    Discharge Instructions  Discharge Instructions    Diet - low sodium heart healthy   Complete by: As directed    Discharge instructions   Complete by: As directed    1)Please follow up with oncology at [facility] on 05/18/19.  Appointment has been scheduled.  You will be called for the appointment also 2)Follow up with radiation oncology. 3)Follow up with intervention radiology for bone biopsy. 4)Take prescribed medications as instructed.Follow up with a PCP as an outpatient.Do a CBC and BMP tests  in a week.   Increase activity slowly   Complete by: As directed      Allergies as of 05/04/2019   No Known Allergies     Medication List     STOP taking these medications   meloxicam 15 MG tablet Commonly known as: MOBIC     TAKE these medications   acetaminophen 500 MG tablet Commonly known as: TYLENOL Take 500 mg by mouth every 6 (six) hours as needed for headache.   dexamethasone 4 MG tablet Commonly known as: DECADRON Take 1 tablet (4 mg total) by mouth 2 (two) times daily.   ferrous sulfate 325 (65 FE) MG EC tablet Take 325 mg by mouth daily.   folic acid 1 MG tablet Commonly known as: FOLVITE Take 1 tablet (1 mg total) by mouth daily. Start taking on: May 05, 2019   levETIRAcetam 500 MG tablet Commonly known as: KEPPRA Take 1 tablet (500 mg total) by mouth 2 (two) times daily.   naproxen sodium 220 MG tablet Commonly known as: ALEVE Take 220 mg by mouth 2 (two) times daily as needed (for headache).   pantoprazole 40 MG tablet Commonly known as: PROTONIX Take 1 tablet (40 mg total) by mouth daily. Start taking on: May 05, 2019   polyethylene glycol 17 g packet Commonly known as: MIRALAX / GLYCOLAX Take 17 g by mouth daily as needed.   thiamine 100 MG tablet Take 1 tablet (100 mg total) by mouth daily. Start taking on: May 05, 2019            Durable Medical Equipment  (From admission, onward)         Start     Ordered   05/04/19 0957  For home use only DME Walker rolling  Once    Question:  Patient needs a walker to treat with the following condition  Answer:  Balance disorder   05/04/19 0956         Follow-up Information    [place] Follow up.   Why: Follow up to establish primary care doctor. Contact information: [place] [id number] [phone]         No Known Allergies  Consultations:  Oncology, pulmonology, IR, radiation oncology   Procedures/Studies: Ct Chest W Contrast  Result Date: 04/28/2019 CLINICAL DATA:  Brain metastasis with unknown primary. EXAM: CT CHEST, ABDOMEN,  AND PELVIS WITH CONTRAST TECHNIQUE: Multidetector CT imaging of the chest, abdomen and pelvis was performed following the standard protocol during bolus administration of intravenous contrast. CONTRAST:  100 cc Omnipaque 300 IV COMPARISON:  No prior chest or abdominal imaging. FINDINGS: CT CHEST FINDINGS Cardiovascular: Thoracic aorta is normal in caliber. Trace atherosclerosis of the aortic arch. Small amount pericardial fluid in the superior pericardial recess. Small coronary artery calcifications. Heart is normal in size. Mediastinum/Nodes: Probable 12 mm right hilar node, contiguous with right upper lobe perihilar mass. No enlarged mediastinal lymph nodes. No left hilar adenopathy. No supraclavicular adenopathy. Visualized thyroid gland is normal. Decompressed esophagus. Lungs/Pleura: Perihilar right upper lobe 5.3 x 4.6 x 3.7 cm pulmonary mass has irregular margins and surrounding ground-glass opacity. This causes mass effect and narrowing of the right upper lobe bronchus. Mass abuts the minor fissure with fissural thickening extending laterally. Mass abuts the major fissure with localized mass effect slight nodular protrusion a possible extension into the lower lobe, for example series 7, image 48. Biapical emphysema with bulla at the left lung apex. No  additional pulmonary mass or nodule. Subpleural atelectasis in the dependent left lower lobe. Musculoskeletal: Bone marrow diffusely heterogeneous in density. Lytic lesion with posterior left T3 vertebral body with posterior cortex involvement and extension to the spinal canal. Lytic lesion involving posterior right T1 vertebral body extending into the pedicle. T8 vertebral body lesion may represent a hemangioma but is nonspecific. Lytic lesion within T10 suspicious for metastatic disease. CT ABDOMEN PELVIS FINDINGS Hepatobiliary: Vague 8 mm low-density lesion in the left lobe of the liver, series 3, image 57. Focal fatty infiltration adjacent to the falciform  ligament. Gallbladder is unremarkable. No biliary dilatation. Pancreas: Unremarkable. No pancreatic ductal dilatation or surrounding inflammatory changes. No evidence of pancreatic mass. Spleen: Normal in size without focal abnormality. Adrenals/Urinary Tract: No adrenal nodule. Left greater than right pelvicaliectasis and prominence of the ureters, likely related to bladder distension. No ureteral stones or cause of obstruction. Urinary bladder is distended without wall thickening. Homogeneous renal enhancement with symmetric excretion on delayed phase imaging. No evidence of focal renal mass. Stomach/Bowel: Stomach is within normal limits. Appendix appears normal. No evidence of bowel wall thickening, distention, or inflammatory changes. No evidence of colonic or small bowel mass. Vascular/Lymphatic: Aorto bi-iliac atherosclerosis. No aneurysm. Patent portal vein. No retroperitoneal adenopathy. No mesenteric or upper abdominal adenopathy. 7 mm right external iliac node is nonspecific. No enlarged inguinal nodes. Reproductive: Heterogeneous enlarged prostate gland spanning 5 cm. This causes mild mass effect on the bladder base. Other: No ascites, free air, or omental nodularity. Small fat containing umbilical hernia. Musculoskeletal: Bone marrow diffusely heterogeneous. Lytic lesion within L4 vertebral body with mild pathologic compression fracture. Lesion extends into the right pedicle. Lytic lesion within anterior L5 vertebral body. Lytic lesion within the right superior pubic ramus extending to the puboacetabular junction. Suspected small central lytic lesion in the sacrum. Suspected small lytic lesion in the left anterior iliac bone and left iliac crest. IMPRESSION: 1. Right upper lobe perihilar mass measuring 5.3 x 4.6 x 3.7 cm consistent with primary bronchogenic malignancy. Mass abuts the major and minor minor fissure with possible extension into the lower lobe. 2. Probable 12 mm right hilar node, contiguous  with the right upper lobe perihilar mass. 3. Osseous metastatic disease with multiple lytic lesions in the thoracic and lumbar spine, pelvis and sacrum. 4. Vague 8 mm low-density lesion in the left lobe of the liver, nonspecific in the setting. 5. Enlarged prostate gland causing mild mass effect on the bladder base. 6. Urinary bladder and bilateral renal collecting system distention may be due to mass effect from enlarged prostate gland. Aortic Atherosclerosis (ICD10-I70.0) and Emphysema (ICD10-J43.9). Electronically Signed   By: Keith Rake M.D.   On: 04/28/2019 03:42   Mr Jeri Cos TF Contrast  Result Date: 04/28/2019 CLINICAL DATA:  58 year old male with code stroke presentation, plain head CT suggesting metastatic disease to the brain. EXAM: MRI HEAD WITHOUT AND WITH CONTRAST MRI CERVICAL SPINE WITHOUT AND WITH CONTRAST TECHNIQUE: Multiplanar, multiecho pulse sequences of the brain and surrounding structures, and cervical spine, to include the craniocervical junction and cervicothoracic junction, were obtained without and with intravenous contrast. CONTRAST:  7.87mL GADAVIST GADOBUTROL 1 MMOL/ML IV SOLN COMPARISON:  Head CT 04/27/2019. FINDINGS: MRI HEAD FINDINGS Brain: Multiple enhancing brain metastases ranging from punctate to 26 millimeters diameter. Approximately 22 individual metastases are identified, with 1 or 2 additional questionable appearing areas (small left choroid plexus versus subependymal metastasis on series 38, image 28). There is an unusual trans-falcine lobulated and enhancing mass  measuring about 23 millimeters (series 39, image 13) with microhemorrhage and an adjacent more cystic 12 centimeter metastasis. Associated mild regional dural thickening. All of the other lesions larger than 1 centimeter are cystic and rim enhancing. No other dural involvement or dural thickening identified. No leptomeningeal tumor identified. Confluent vasogenic edema in both frontal and parietal lobes as  seen by CT earlier today. Regional mass effect with no midline shift or loss of basilar cisterns. Mass effect on both lateral ventricles with no ventriculomegaly or transependymal edema. No superimposed restricted diffusion suggestive of acute infarction. No acute intracranial hemorrhage. Cervicomedullary junction and pituitary are within normal limits. Vascular: Major intracranial vascular flow voids are preserved. The major dural venous sinuses are enhancing and appear to be patent. Skull and upper cervical spine: Cervical spine reported separately. Calvarium bone marrow signal is within normal limits. Sinuses/Orbits: Negative orbits. Trace paranasal sinus mucosal thickening. Other: Mastoids are clear. Visible internal auditory structures appear normal. Scalp and face soft tissues appear negative. MRI CERVICAL SPINE FINDINGS Alignment: Mild straightening of cervical lordosis. No spondylolisthesis. Vertebrae: Abnormal marrow signal from the C3 vertebra into the visible upper thoracic spine, although little abnormal enhancement and marrow edema in the cervical vertebrae. A 12 millimeter T1 and T2 heterogeneous lesion in the anterior C3 body most resembles a benign hemangioma. There is a small enhancing metastasis in the superior right T1 body. There is subtotal T3 vertebral body replacement by enhancing tumor. Cord: Spinal cord signal is within normal limits at all visualized levels. Posterior Fossa, vertebral arteries, paraspinal tissues: Preserved major vascular flow voids in the neck. No cervical lymphadenopathy identified. Disc levels: No cervical spine epidural or extraosseous tumor identified. However, there does appear to be early ventral epidural space extension of tumor at the left T3 body. There is superimposed cervical spine degeneration which results in spinal stenosis with mild spinal cord mass effect at C4-C5, C5-C6, and C6-C7. No associated cord signal abnormality. IMPRESSION: 1. Widespread metastatic  disease to the brain. Approximately 22 individual brain metastases ranging from punctate to 26 mm diameter, including an unusual trans-falcine lesion with some associated microhemorrhage and dural involvement. 2. Confluent cerebral vasogenic edema with no midline shift or loss of basilar cisterns. 3. Abnormal marrow signal throughout much of the cervical spine suspicious for widespread bone metastases although enhancing metastases are only confirmed in the visible upper thoracic spine (including with early ventral epidural spread at T3). 4. Superimposed cervical spine degeneration with degenerative spinal stenosis at C4-C5 through C6-C7 with degenerative spinal cord mass effect but no associated cord signal abnormality. Electronically Signed   By: Genevie Ann M.D.   On: 04/28/2019 03:06   Mr Cervical Spine W Wo Contrast  Result Date: 04/28/2019 CLINICAL DATA:  58 year old male with code stroke presentation, plain head CT suggesting metastatic disease to the brain. EXAM: MRI HEAD WITHOUT AND WITH CONTRAST MRI CERVICAL SPINE WITHOUT AND WITH CONTRAST TECHNIQUE: Multiplanar, multiecho pulse sequences of the brain and surrounding structures, and cervical spine, to include the craniocervical junction and cervicothoracic junction, were obtained without and with intravenous contrast. CONTRAST:  7.65mL GADAVIST GADOBUTROL 1 MMOL/ML IV SOLN COMPARISON:  Head CT 04/27/2019. FINDINGS: MRI HEAD FINDINGS Brain: Multiple enhancing brain metastases ranging from punctate to 26 millimeters diameter. Approximately 22 individual metastases are identified, with 1 or 2 additional questionable appearing areas (small left choroid plexus versus subependymal metastasis on series 38, image 28). There is an unusual trans-falcine lobulated and enhancing mass measuring about 23 millimeters (series 39, image 13)  with microhemorrhage and an adjacent more cystic 12 centimeter metastasis. Associated mild regional dural thickening. All of the other  lesions larger than 1 centimeter are cystic and rim enhancing. No other dural involvement or dural thickening identified. No leptomeningeal tumor identified. Confluent vasogenic edema in both frontal and parietal lobes as seen by CT earlier today. Regional mass effect with no midline shift or loss of basilar cisterns. Mass effect on both lateral ventricles with no ventriculomegaly or transependymal edema. No superimposed restricted diffusion suggestive of acute infarction. No acute intracranial hemorrhage. Cervicomedullary junction and pituitary are within normal limits. Vascular: Major intracranial vascular flow voids are preserved. The major dural venous sinuses are enhancing and appear to be patent. Skull and upper cervical spine: Cervical spine reported separately. Calvarium bone marrow signal is within normal limits. Sinuses/Orbits: Negative orbits. Trace paranasal sinus mucosal thickening. Other: Mastoids are clear. Visible internal auditory structures appear normal. Scalp and face soft tissues appear negative. MRI CERVICAL SPINE FINDINGS Alignment: Mild straightening of cervical lordosis. No spondylolisthesis. Vertebrae: Abnormal marrow signal from the C3 vertebra into the visible upper thoracic spine, although little abnormal enhancement and marrow edema in the cervical vertebrae. A 12 millimeter T1 and T2 heterogeneous lesion in the anterior C3 body most resembles a benign hemangioma. There is a small enhancing metastasis in the superior right T1 body. There is subtotal T3 vertebral body replacement by enhancing tumor. Cord: Spinal cord signal is within normal limits at all visualized levels. Posterior Fossa, vertebral arteries, paraspinal tissues: Preserved major vascular flow voids in the neck. No cervical lymphadenopathy identified. Disc levels: No cervical spine epidural or extraosseous tumor identified. However, there does appear to be early ventral epidural space extension of tumor at the left T3  body. There is superimposed cervical spine degeneration which results in spinal stenosis with mild spinal cord mass effect at C4-C5, C5-C6, and C6-C7. No associated cord signal abnormality. IMPRESSION: 1. 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 Approximately 22 individual brain metastases ranging from punctate to 26 mm diameter, including an unusual trans-falcine lesion with some associated microhemorrhage and dural involvement. 2. Confluent cerebral vasogenic edema with no midline shift or loss of basilar cisterns. 3. Abnormal marrow signal throughout much of the cervical spine suspicious for widespread bone metastases although enhancing metastases are only confirmed in the visible upper thoracic spine (including with early ventral epidural spread at T3). 4. Superimposed cervical spine degeneration with degenerative spinal stenosis at C4-C5 through C6-C7 with degenerative spinal cord mass effect but no associated cord signal abnormality. Electronically Signed   By: Genevie Ann M.D.   On: 04/28/2019 03:06   Ct Abdomen Pelvis W Contrast  Result Date: 04/28/2019 CLINICAL DATA:  Brain metastasis with unknown primary. EXAM: CT CHEST, ABDOMEN, AND PELVIS WITH CONTRAST TECHNIQUE: Multidetector CT imaging of the chest, abdomen and pelvis was performed following the standard protocol during bolus administration of intravenous contrast. CONTRAST:  100 cc Omnipaque 300 IV COMPARISON:  No prior chest or abdominal imaging. FINDINGS: CT CHEST FINDINGS Cardiovascular: Thoracic aorta is normal in caliber. Trace atherosclerosis of the aortic arch. Small amount pericardial fluid in the superior pericardial recess. Small coronary artery calcifications. Heart is normal in size. Mediastinum/Nodes: Probable 12 mm right hilar node, contiguous with right upper lobe perihilar mass. No enlarged mediastinal lymph nodes. No left hilar adenopathy. No supraclavicular adenopathy. Visualized thyroid gland is normal. Decompressed esophagus.  Lungs/Pleura: Perihilar right upper lobe 5.3 x 4.6 x 3.7 cm pulmonary mass has irregular margins and surrounding ground-glass opacity. This  causes mass effect and narrowing of the right upper lobe bronchus. Mass abuts the minor fissure with fissural thickening extending laterally. Mass abuts the major fissure with localized mass effect slight nodular protrusion a possible extension into the lower lobe, for example series 7, image 48. Biapical emphysema with bulla at the left lung apex. No additional pulmonary mass or nodule. Subpleural atelectasis in the dependent left lower lobe. Musculoskeletal: Bone marrow diffusely heterogeneous in density. Lytic lesion with posterior left T3 vertebral body with posterior cortex involvement and extension to the spinal canal. Lytic lesion involving posterior right T1 vertebral body extending into the pedicle. T8 vertebral body lesion may represent a hemangioma but is nonspecific. Lytic lesion within T10 suspicious for metastatic disease. CT ABDOMEN PELVIS FINDINGS Hepatobiliary: Vague 8 mm low-density lesion in the left lobe of the liver, series 3, image 57. Focal fatty infiltration adjacent to the falciform ligament. Gallbladder is unremarkable. No biliary dilatation. Pancreas: Unremarkable. No pancreatic ductal dilatation or surrounding inflammatory changes. No evidence of pancreatic mass. Spleen: Normal in size without focal abnormality. Adrenals/Urinary Tract: No adrenal nodule. Left greater than right pelvicaliectasis and prominence of the ureters, likely related to bladder distension. No ureteral stones or cause of obstruction. Urinary bladder is distended without wall thickening. Homogeneous renal enhancement with symmetric excretion on delayed phase imaging. No evidence of focal renal mass. Stomach/Bowel: Stomach is within normal limits. Appendix appears normal. No evidence of bowel wall thickening, distention, or inflammatory changes. No evidence of colonic or small  bowel mass. Vascular/Lymphatic: Aorto bi-iliac atherosclerosis. No aneurysm. Patent portal vein. No retroperitoneal adenopathy. No mesenteric or upper abdominal adenopathy. 7 mm right external iliac node is nonspecific. No enlarged inguinal nodes. Reproductive: Heterogeneous enlarged prostate gland spanning 5 cm. This causes mild mass effect on the bladder base. Other: No ascites, free air, or omental nodularity. Small fat containing umbilical hernia. Musculoskeletal: Bone marrow diffusely heterogeneous. Lytic lesion within L4 vertebral body with mild pathologic compression fracture. Lesion extends into the right pedicle. Lytic lesion within anterior L5 vertebral body. Lytic lesion within the right superior pubic ramus extending to the puboacetabular junction. Suspected small central lytic lesion in the sacrum. Suspected small lytic lesion in the left anterior iliac bone and left iliac crest. IMPRESSION: 1. Right upper lobe perihilar mass measuring 5.3 x 4.6 x 3.7 cm consistent with primary bronchogenic malignancy. Mass abuts the major and minor minor fissure with possible extension into the lower lobe. 2. Probable 12 mm right hilar node, contiguous with the right upper lobe perihilar mass. 3. Osseous metastatic disease with multiple lytic lesions in the thoracic and lumbar spine, pelvis and sacrum. 4. Vague 8 mm low-density lesion in the left lobe of the liver, nonspecific in the setting. 5. Enlarged prostate gland causing mild mass effect on the bladder base. 6. Urinary bladder and bilateral renal collecting system distention may be due to mass effect from enlarged prostate gland. Aortic Atherosclerosis (ICD10-I70.0) and Emphysema (ICD10-J43.9). Electronically Signed   By: Keith Rake M.D.   On: 04/28/2019 03:42   Ct Head Code Stroke Wo Contrast  Result Date: 04/27/2019 CLINICAL DATA:  Code stroke.  Right-sided weakness. EXAM: CT HEAD WITHOUT CONTRAST TECHNIQUE: Contiguous axial images were obtained from  the base of the skull through the vertex without intravenous contrast. COMPARISON:  None. FINDINGS: Brain: Rounded low-density masses posteriorly in both frontal lobes near the vertex each measure 1.7 cm, and there is a 2.5 cm low-density mass more anteriorly in the left frontal lobe. There is extensive  vasogenic edema in the left greater than right frontal lobes with regional sulcal effacement and slight mass effect on the lateral ventricles without midline shift. A small area of edema is noted in the right occipital lobe without a presumed underlying lesion visible on this unenhanced study. No acute cortically based infarct, intracranial hemorrhage, or extra-axial fluid collection is identified. Vascular: No hyperdense vessel. Skull: No fracture or focal osseous lesion. Sinuses/Orbits: Visualized paranasal sinuses and mastoid air cells are clear. Orbits are unremarkable. Other: None. ASPECTS [facility] Stroke Program Early CT Score) Not scored due to the presence of multiple cerebral masses. IMPRESSION: 1. 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 Brain MRI without and with contrast is recommended for further evaluation. 2. No midline shift or intracranial hemorrhage. These results were called by telephone at the time of interpretation on 04/27/2019 at 6:45 pm to Dr. Ezequiel Essex, who verbally acknowledged these results. Electronically Signed   By: Logan Bores M.D.   On: 04/27/2019 18:47      Subjective: Patient seen and examined the bedside this morning.  He is medically stable for discharge today.  Discharge Exam: Vitals:   05/04/19 0507 05/04/19 1324  BP: (!) 143/81 121/69  Pulse: 70 (!) 59  Resp: 20 16  Temp: 98.3 F (36.8 C) 98 F (36.7 C)  SpO2: 98% 98%   Vitals:   05/03/19 1335 05/03/19 2035 05/04/19 0507 05/04/19 1324  BP: 135/80 129/81 (!) 143/81 121/69  Pulse: 66 72 70 (!) 59  Resp: 18 20 20 16   Temp: 97.9 F (36.6 C) 98.7  F (37.1 C) 98.3 F (36.8 C) 98 F (36.7 C)  TempSrc: Oral Oral Oral Oral  SpO2: 100% 97% 98% 98%  Weight:   62.9 kg   Height:        General: Pt is alert, awake, not in acute distress Cardiovascular: RRR, S1/S2 +, no rubs, no gallops Respiratory: CTA bilaterally, no wheezing, no rhonchi Abdominal: Soft, NT, ND, bowel sounds + Extremities: no edema, no cyanosis    The results of significant diagnostics from this hospitalization (including imaging, microbiology, ancillary and laboratory) are listed below for reference.     Microbiology: Recent Results (from the past 240 hour(s))  SARS CORONAVIRUS 2 (TAT 6-24 HRS) Nasopharyngeal Nasopharyngeal Swab     Status: None   Collection Time: 04/27/19  6:57 PM   Specimen: Nasopharyngeal Swab  Result Value Ref Range Status   SARS Coronavirus 2 NEGATIVE NEGATIVE Final    Comment: (NOTE) SARS-CoV-2 target nucleic acids are NOT DETECTED. The SARS-CoV-2 RNA is generally detectable in upper and lower respiratory specimens during the acute phase of infection. Negative results do not preclude SARS-CoV-2 infection, do not rule out co-infections with other pathogens, and should not be used as the sole basis for treatment or other patient management decisions. Negative results must be combined with clinical observations, patient history, and epidemiological information. The expected result is Negative. Fact Sheet for Patients: SugarRoll.be Fact Sheet for Healthcare Providers: https://www.woods-mathews.com/ This test is not yet approved or cleared by the [place] FDA and  has been authorized for detection and/or diagnosis of SARS-CoV-2 by FDA under an Emergency Use Authorization (EUA). This EUA will remain  in effect (meaning this test can be used) for the duration of the COVID-19 declaration under Section 56 4(b)(1) of the Act, 21 U.S.C. section 360bbb-3(b)(1), unless the authorization is  terminated or revoked sooner. Performed at [facility], [place]  [address] [place], [place] [address]   MRSA PCR Screening     Status: None   Collection Time: 04/28/19 12:50 AM   Specimen: Nasal Mucosa; Nasopharyngeal  Result Value Ref Range Status   MRSA by PCR NEGATIVE NEGATIVE Final    Comment:        The GeneXpert MRSA Assay (FDA approved for NASAL specimens only), is one component of a comprehensive MRSA colonization surveillance program. It is not intended to diagnose MRSA infection nor to guide or monitor treatment for MRSA infections. Performed at [facility], [place] [address] [place], [place] [address]   Acid Fast Smear (AFB)     Status: None   Collection Time: 05/03/19  8:54 AM   Specimen: Lung  Result Value Ref Range Status   AFB Specimen Processing Concentration  Final   Acid Fast Smear Negative  Final    Comment: (NOTE) Performed At: [facility] [place], [place] [id number] Rush Farmer MD YS:[id number]    Source (AFB) LUNG  Final    Comment: Performed at [facility], [place] [address], [place], [place] [address]     Labs: BNP (last 3 results) No results for input(s): BNP in the last 8760 hours. Basic Metabolic Panel: Recent Labs  Lab 04/27/19 1835 04/30/19 0912 05/04/19 0531  NA 135 132* 130*  K 3.4* 4.1 4.1  CL 102 97* 98  CO2 24 26 24   GLUCOSE 153* 238* 154*  BUN 14 15 19   CREATININE 0.77 0.65 0.47*  CALCIUM 9.4 9.7 9.6   Liver Function Tests: Recent Labs  Lab 04/27/19 1835  AST 20  ALT 14  ALKPHOS 100  BILITOT 0.6  PROT 8.4*  ALBUMIN 3.2*   No results for input(s): LIPASE, AMYLASE in the last 168 hours. No results for input(s): AMMONIA in the last 168 hours. CBC: Recent Labs  Lab 04/27/19 1835 05/01/19 0555 05/02/19 0517 05/03/19 0600 05/04/19 0531  WBC 9.9 17.7* 17.8* 15.8* 14.8*  NEUTROABS 5.7 14.3* 13.8* 12.1* 11.6*  HGB 11.3* 11.9* 12.6* 12.3* 12.6*  HCT 35.6* 38.2*  41.0 39.4 39.8  MCV 92.2 93.4 93.0 92.7 92.8  PLT 428* 447* 452* 403* 360   Cardiac Enzymes: No results for input(s): CKTOTAL, CKMB, CKMBINDEX, TROPONINI in the last 168 hours. BNP: Invalid input(s): POCBNP CBG: Recent Labs  Lab 05/03/19 1151 05/03/19 1638 05/03/19 2037 05/04/19 0719 05/04/19 1156  GLUCAP 87 182* 145* 122* 186*   D-Dimer No results for input(s): DDIMER in the last 72 hours. Hgb A1c Recent Labs    05/03/19 0600  HGBA1C 5.3   Lipid Profile No results for input(s): CHOL, HDL, LDLCALC, TRIG, CHOLHDL, LDLDIRECT in the last 72 hours. Thyroid function studies No results for input(s): TSH, T4TOTAL, T3FREE, THYROIDAB in the last 72 hours.  Invalid input(s): FREET3 Anemia work up No results for input(s): VITAMINB12, FOLATE, FERRITIN, TIBC, IRON, RETICCTPCT in the last 72 hours. Urinalysis    Component Value Date/Time   COLORURINE STRAW (A) 04/27/2019 1825   APPEARANCEUR CLEAR 04/27/2019 1825   LABSPEC 1.005 04/27/2019 1825   PHURINE 6.0 04/27/2019 1825   GLUCOSEU NEGATIVE 04/27/2019 1825   HGBUR NEGATIVE 04/27/2019 1825   BILIRUBINUR NEGATIVE 04/27/2019 1825   KETONESUR NEGATIVE 04/27/2019 1825   PROTEINUR NEGATIVE 04/27/2019 1825   NITRITE NEGATIVE 04/27/2019 1825   LEUKOCYTESUR NEGATIVE 04/27/2019 1825   Sepsis Labs Invalid input(s): PROCALCITONIN,  WBC,  LACTICIDVEN Microbiology Recent Results (from the past 240 hour(s))  SARS CORONAVIRUS 2 (TAT 6-24  HRS) Nasopharyngeal Nasopharyngeal Swab     Status: None   Collection Time: 04/27/19  6:57 PM   Specimen: Nasopharyngeal Swab  Result Value Ref Range Status   SARS Coronavirus 2 NEGATIVE NEGATIVE Final    Comment: (NOTE) SARS-CoV-2 target nucleic acids are NOT DETECTED. The SARS-CoV-2 RNA is generally detectable in upper and lower respiratory specimens during the acute phase of infection. Negative results do not preclude SARS-CoV-2 infection, do not rule out co-infections with other pathogens, and  should not be used as the sole basis for treatment or other patient management decisions. Negative results must be combined with clinical observations, patient history, and epidemiological information. The expected result is Negative. Fact Sheet for Patients: SugarRoll.be Fact Sheet for Healthcare Providers: https://www.woods-mathews.com/ This test is not yet approved or cleared by the [place] FDA and  has been authorized for detection and/or diagnosis of SARS-CoV-2 by FDA under an Emergency Use Authorization (EUA). This EUA will remain  in effect (meaning this test can be used) for the duration of the COVID-19 declaration under Section 56 4(b)(1) of the Act, 21 U.S.C. section 360bbb-3(b)(1), unless the authorization is terminated or revoked sooner. Performed at [facility], [place] [address] [place], [place] [address]   MRSA PCR Screening     Status: None   Collection Time: 04/28/19 12:50 AM   Specimen: Nasal Mucosa; Nasopharyngeal  Result Value Ref Range Status   MRSA by PCR NEGATIVE NEGATIVE Final    Comment:        The GeneXpert MRSA Assay (FDA approved for NASAL specimens only), is one component of a comprehensive MRSA colonization surveillance program. It is not intended to diagnose MRSA infection nor to guide or monitor treatment for MRSA infections. Performed at [facility], [place] [address], [place], [place] [address]   Acid Fast Smear (AFB)     Status: None   Collection Time: 05/03/19  8:54 AM   Specimen: Lung  Result Value Ref Range Status   AFB Specimen Processing Concentration  Final   Acid Fast Smear Negative  Final    Comment: (NOTE) Performed At: [facility] [place], [place] [id number] Rush Farmer MD JS:[id number]    Source (AFB) LUNG  Final    Comment: Performed at [facility], [place] [address], [place], [place] [address]    Please note: You were  cared for by a hospitalist during your hospital stay. Once you are discharged, your primary care physician will handle any further medical issues. Please note that NO REFILLS for any discharge medications will be authorized once you are discharged, as it is imperative that you return to your primary care physician (or establish a relationship with a primary care physician if you do not have one) for your post hospital discharge needs so that they can reassess your need for medications and monitor your lab values.    Time coordinating discharge: 40 minutes  SIGNED:   Shelly Coss, MD  [facility] 05/04/2019, 3:58 PM Pager [phone]  If 7PM-7AM, please contact night-coverage [email or web address] Password [email or web address]

## 2019-05-04 NOTE — Progress Notes (Signed)
Patient is scheduled to see [name] on 12/9 as new patient consult.

## 2019-05-04 NOTE — Progress Notes (Signed)
Physical Therapy Treatment Patient Details Name: Brandon Ferguson MRN: [id number] DOB: 10/27/1960 Today's Date: 05/04/2019    History of Present Illness 58 yo male with onset of R UE weakness was admitted and from bronchoscopy found masses in chest and then c-spine, with brain mets.  He is now scheduled for radiation to treat tumors.  Pt is Covid (-).   PMHx:  smoker 45 years, no recent medical care    PT Comments    Pt is progressing well with mobility, much improved balance today. He ambulated 230' with RW, no loss of balance.   Follow Up Recommendations  Home health PT(per chart review CIR declined and pt has no Insurance for SNF)     Equipment Recommendations  Rolling walker with 5" wheels    Recommendations for Other Services       Precautions / Restrictions Precautions Precautions: Fall Precaution Comments: brain mets Restrictions Weight Bearing Restrictions: No    Mobility  Bed Mobility Overal bed mobility: Modified Independent Bed Mobility: Supine to Sit     Supine to sit: Modified independent (Device/Increase time)     General bed mobility comments: used rail  Transfers Overall transfer level: Needs assistance Equipment used: Rolling walker (2 wheeled) Transfers: Sit to/from Stand Sit to Stand: Supervision         General transfer comment: steady  Ambulation/Gait Ambulation/Gait assistance: Min Gaffer (Feet): 230 Feet Assistive device: Rolling walker (2 wheeled) Gait Pattern/deviations: Step-through pattern;Decreased stride length Gait velocity: WFL   General Gait Details: steady with RW, no loss of balance   Stairs             Wheelchair Mobility    Modified Rankin (Stroke Patients Only)       Balance Overall balance assessment: Needs assistance Sitting-balance support: Bilateral upper extremity supported;Feet supported Sitting balance-Leahy Scale: Good     Standing balance support: Bilateral upper  extremity supported;During functional activity Standing balance-Leahy Scale: Fair                              Cognition Arousal/Alertness: Awake/alert Behavior During Therapy: Flat affect Overall Cognitive Status: Within Functional Limits for tasks assessed                                        Exercises      General Comments        Pertinent Vitals/Pain Pain Assessment: 0-10 Pain Score: 3  Pain Location: back Pain Descriptors / Indicators: Aching Pain Intervention(s): Limited activity within patient's tolerance;Monitored during session;Repositioned    Home Living                      Prior Function            PT Goals (current goals can now be found in the care plan section) Acute Rehab PT Goals Patient Stated Goal: likes to hunt and fish PT Goal Formulation: With patient Time For Goal Achievement: 05/15/19 Potential to Achieve Goals: Fair Progress towards PT goals: Progressing toward goals    Frequency    Min 3X/week      PT Plan Current plan remains appropriate    Co-evaluation              AM-PAC PT "6 Clicks" Mobility   Outcome Measure  Help needed turning from your back to  your side while in a flat bed without using bedrails?: None Help needed moving from lying on your back to sitting on the side of a flat bed without using bedrails?: A Little Help needed moving to and from a bed to a chair (including a wheelchair)?: A Little Help needed standing up from a chair using your arms (e.g., wheelchair or bedside chair)?: A Little Help needed to walk in hospital room?: A Little Help needed climbing 3-5 steps with a railing? : A Little 6 Click Score: 19    End of Session Equipment Utilized During Treatment: Gait belt Activity Tolerance: Patient tolerated treatment well Patient left: in bed;with call bell/phone within reach;with bed alarm set Nurse Communication: Mobility status PT Visit Diagnosis:  Unsteadiness on feet (R26.81);Muscle weakness (generalized) (M62.81);Other abnormalities of gait and mobility (R26.89)     Time: 2423-5361 PT Time Calculation (min) (ACUTE ONLY): 11 min  Charges:  $Gait Training: 8-22 mins                     Blondell Reveal Kistler PT 05/04/2019  Acute Rehabilitation Services Pager [phone] Office [phone]

## 2019-05-04 NOTE — Progress Notes (Signed)
  Unfortunately repeat bronchoscopic biopsy was nondiagnostic again, I discussed with oncology PA and pathologist. Would suggest IR consult for biopsy of bone lesions  Brandon Weathington V. [place] Soho MD

## 2019-05-04 NOTE — Progress Notes (Signed)
HEMATOLOGY-ONCOLOGY PROGRESS NOTE  SUBJECTIVE: Denies headaches and vision changes today. No chest pain or shortness of breath. No hemoptysis.   REVIEW OF SYSTEMS:   Constitutional: Denies fevers, chills  Eyes: Denies blurriness of vision Ears, nose, mouth, throat, and face: Denies mucositis or sore throat Respiratory: Denies cough, dyspnea or wheezes Cardiovascular: Denies palpitation, chest discomfort Gastrointestinal:  Denies nausea, heartburn or change in bowel habits Skin: Denies abnormal skin rashes Lymphatics: Denies new lymphadenopathy or easy bruising Neurological:Denies numbness, tingling or new weaknesses Behavioral/Psych: Mood is stable, no new changes  Extremities: No lower extremity edema All other systems were reviewed with the patient and are negative.  I have reviewed the past medical history, past surgical history, social history and family history with the patient and they are unchanged from previous note.   PHYSICAL EXAMINATION: ECOG PERFORMANCE STATUS: 1 - Symptomatic but completely ambulatory  Vitals:   05/04/19 0507 05/04/19 1324  BP: (!) 143/81 121/69  Pulse: 70 (!) 59  Resp: 20 16  Temp: 98.3 F (36.8 C) 98 F (36.7 C)  SpO2: 98% 98%   Filed Weights   04/29/19 0413 05/02/19 0500 05/04/19 0507  Weight: 150 lb 12.7 oz (68.4 kg) 139 lb 8.8 oz (63.3 kg) 138 lb 10.7 oz (62.9 kg)    Intake/Output from previous day: 11/24 0701 - 11/25 0700 In: 1210 [P.O.:960; I.V.:250] Out: 1826 [Urine:1825; Stool:1]  GENERAL:alert, no distress and comfortable SKIN: skin color, texture, turgor are normal, no rashes or significant lesions EYES: normal, Conjunctiva are pink and non-injected, sclera clear OROPHARYNX:no exudate, no erythema and lips, buccal mucosa, and tongue normal  NECK: supple, thyroid normal size, non-tender, without nodularity LYMPH:  no palpable lymphadenopathy in the cervical, axillary or inguinal LUNGS: clear to auscultation and percussion with  normal breathing effort HEART: regular rate & rhythm and no murmurs and no lower extremity edema ABDOMEN:abdomen soft, non-tender and normal bowel sounds Musculoskeletal:no cyanosis of digits and no clubbing  NEURO: alert & oriented x 3 with fluent speech, no focal motor/sensory deficits  LABORATORY DATA:  I have reviewed the data as listed CMP Latest Ref Rng & Units 05/04/2019 04/30/2019 04/27/2019  Glucose 70 - 99 mg/dL 154(H) 238(H) 153(H)  BUN 6 - 20 mg/dL 19 15 14   Creatinine 0.61 - 1.24 mg/dL 0.47(L) 0.65 0.77  Sodium 135 - 145 mmol/L 130(L) 132(L) 135  Potassium 3.5 - 5.1 mmol/L 4.1 4.1 3.4(L)  Chloride 98 - 111 mmol/L 98 97(L) 102  CO2 22 - 32 mmol/L 24 26 24   Calcium 8.9 - 10.3 mg/dL 9.6 9.7 9.4  Total Protein 6.5 - 8.1 g/dL - - 8.4(H)  Total Bilirubin 0.3 - 1.2 mg/dL - - 0.6  Alkaline Phos 38 - 126 U/L - - 100  AST 15 - 41 U/L - - 20  ALT 0 - 44 U/L - - 14    Lab Results  Component Value Date   WBC 14.8 (H) 05/04/2019   HGB 12.6 (L) 05/04/2019   HCT 39.8 05/04/2019   MCV 92.8 05/04/2019   PLT 360 05/04/2019   NEUTROABS 11.6 (H) 05/04/2019    Ct Chest W Contrast  Result Date: 04/28/2019 CLINICAL DATA:  Brain metastasis with unknown primary. EXAM: CT CHEST, ABDOMEN, AND PELVIS WITH CONTRAST TECHNIQUE: Multidetector CT imaging of the chest, abdomen and pelvis was performed following the standard protocol during bolus administration of intravenous contrast. CONTRAST:  100 cc Omnipaque 300 IV COMPARISON:  No prior chest or abdominal imaging. FINDINGS: CT CHEST FINDINGS Cardiovascular: Thoracic  aorta is normal in caliber. Trace atherosclerosis of the aortic arch. Small amount pericardial fluid in the superior pericardial recess. Small coronary artery calcifications. Heart is normal in size. Mediastinum/Nodes: Probable 12 mm right hilar node, contiguous with right upper lobe perihilar mass. No enlarged mediastinal lymph nodes. No left hilar adenopathy. No supraclavicular  adenopathy. Visualized thyroid gland is normal. Decompressed esophagus. Lungs/Pleura: Perihilar right upper lobe 5.3 x 4.6 x 3.7 cm pulmonary mass has irregular margins and surrounding ground-glass opacity. This causes mass effect and narrowing of the right upper lobe bronchus. Mass abuts the minor fissure with fissural thickening extending laterally. Mass abuts the major fissure with localized mass effect slight nodular protrusion a possible extension into the lower lobe, for example series 7, image 48. Biapical emphysema with bulla at the left lung apex. No additional pulmonary mass or nodule. Subpleural atelectasis in the dependent left lower lobe. Musculoskeletal: Bone marrow diffusely heterogeneous in density. Lytic lesion with posterior left T3 vertebral body with posterior cortex involvement and extension to the spinal canal. Lytic lesion involving posterior right T1 vertebral body extending into the pedicle. T8 vertebral body lesion may represent a hemangioma but is nonspecific. Lytic lesion within T10 suspicious for metastatic disease. CT ABDOMEN PELVIS FINDINGS Hepatobiliary: Vague 8 mm low-density lesion in the left lobe of the liver, series 3, image 57. Focal fatty infiltration adjacent to the falciform ligament. Gallbladder is unremarkable. No biliary dilatation. Pancreas: Unremarkable. No pancreatic ductal dilatation or surrounding inflammatory changes. No evidence of pancreatic mass. Spleen: Normal in size without focal abnormality. Adrenals/Urinary Tract: No adrenal nodule. Left greater than right pelvicaliectasis and prominence of the ureters, likely related to bladder distension. No ureteral stones or cause of obstruction. Urinary bladder is distended without wall thickening. Homogeneous renal enhancement with symmetric excretion on delayed phase imaging. No evidence of focal renal mass. Stomach/Bowel: Stomach is within normal limits. Appendix appears normal. No evidence of bowel wall thickening,  distention, or inflammatory changes. No evidence of colonic or small bowel mass. Vascular/Lymphatic: Aorto bi-iliac atherosclerosis. No aneurysm. Patent portal vein. No retroperitoneal adenopathy. No mesenteric or upper abdominal adenopathy. 7 mm right external iliac node is nonspecific. No enlarged inguinal nodes. Reproductive: Heterogeneous enlarged prostate gland spanning 5 cm. This causes mild mass effect on the bladder base. Other: No ascites, free air, or omental nodularity. Small fat containing umbilical hernia. Musculoskeletal: Bone marrow diffusely heterogeneous. Lytic lesion within L4 vertebral body with mild pathologic compression fracture. Lesion extends into the right pedicle. Lytic lesion within anterior L5 vertebral body. Lytic lesion within the right superior pubic ramus extending to the puboacetabular junction. Suspected small central lytic lesion in the sacrum. Suspected small lytic lesion in the left anterior iliac bone and left iliac crest. IMPRESSION: 1. Right upper lobe perihilar mass measuring 5.3 x 4.6 x 3.7 cm consistent with primary bronchogenic malignancy. Mass abuts the major and minor minor fissure with possible extension into the lower lobe. 2. Probable 12 mm right hilar node, contiguous with the right upper lobe perihilar mass. 3. Osseous metastatic disease with multiple lytic lesions in the thoracic and lumbar spine, pelvis and sacrum. 4. Vague 8 mm low-density lesion in the left lobe of the liver, nonspecific in the setting. 5. Enlarged prostate gland causing mild mass effect on the bladder base. 6. Urinary bladder and bilateral renal collecting system distention may be due to mass effect from enlarged prostate gland. Aortic Atherosclerosis (ICD10-I70.0) and Emphysema (ICD10-J43.9). Electronically Signed   By: Keith Rake M.D.   On:  04/28/2019 03:42   Mr Jeri Cos HQ Contrast  Result Date: 04/28/2019 CLINICAL DATA:  58 year old male with code stroke presentation, plain head  CT suggesting metastatic disease to the brain. EXAM: MRI HEAD WITHOUT AND WITH CONTRAST MRI CERVICAL SPINE WITHOUT AND WITH CONTRAST TECHNIQUE: Multiplanar, multiecho pulse sequences of the brain and surrounding structures, and cervical spine, to include the craniocervical junction and cervicothoracic junction, were obtained without and with intravenous contrast. CONTRAST:  7.65mL GADAVIST GADOBUTROL 1 MMOL/ML IV SOLN COMPARISON:  Head CT 04/27/2019. FINDINGS: MRI HEAD FINDINGS Brain: Multiple enhancing brain metastases ranging from punctate to 26 millimeters diameter. Approximately 22 individual metastases are identified, with 1 or 2 additional questionable appearing areas (small left choroid plexus versus subependymal metastasis on series 38, image 28). There is an unusual trans-falcine lobulated and enhancing mass measuring about 23 millimeters (series 39, image 13) with microhemorrhage and an adjacent more cystic 12 centimeter metastasis. Associated mild regional dural thickening. All of the other lesions larger than 1 centimeter are cystic and rim enhancing. No other dural involvement or dural thickening identified. No leptomeningeal tumor identified. Confluent vasogenic edema in both frontal and parietal lobes as seen by CT earlier today. Regional mass effect with no midline shift or loss of basilar cisterns. Mass effect on both lateral ventricles with no ventriculomegaly or transependymal edema. No superimposed restricted diffusion suggestive of acute infarction. No acute intracranial hemorrhage. Cervicomedullary junction and pituitary are within normal limits. Vascular: Major intracranial vascular flow voids are preserved. The major dural venous sinuses are enhancing and appear to be patent. Skull and upper cervical spine: Cervical spine reported separately. Calvarium bone marrow signal is within normal limits. Sinuses/Orbits: Negative orbits. Trace paranasal sinus mucosal thickening. Other: Mastoids are  clear. Visible internal auditory structures appear normal. Scalp and face soft tissues appear negative. MRI CERVICAL SPINE FINDINGS Alignment: Mild straightening of cervical lordosis. No spondylolisthesis. Vertebrae: Abnormal marrow signal from the C3 vertebra into the visible upper thoracic spine, although little abnormal enhancement and marrow edema in the cervical vertebrae. A 12 millimeter T1 and T2 heterogeneous lesion in the anterior C3 body most resembles a benign hemangioma. There is a small enhancing metastasis in the superior right T1 body. There is subtotal T3 vertebral body replacement by enhancing tumor. Cord: Spinal cord signal is within normal limits at all visualized levels. Posterior Fossa, vertebral arteries, paraspinal tissues: Preserved major vascular flow voids in the neck. No cervical lymphadenopathy identified. Disc levels: No cervical spine epidural or extraosseous tumor identified. However, there does appear to be early ventral epidural space extension of tumor at the left T3 body. There is superimposed cervical spine degeneration which results in spinal stenosis with mild spinal cord mass effect at C4-C5, C5-C6, and C6-C7. No associated cord signal abnormality. IMPRESSION: 1. 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 Approximately 22 individual brain metastases ranging from punctate to 26 mm diameter, including an unusual trans-falcine lesion with some associated microhemorrhage and dural involvement. 2. Confluent cerebral vasogenic edema with no midline shift or loss of basilar cisterns. 3. Abnormal marrow signal throughout much of the cervical spine suspicious for widespread bone metastases although enhancing metastases are only confirmed in the visible upper thoracic spine (including with early ventral epidural spread at T3). 4. Superimposed cervical spine degeneration with degenerative spinal stenosis at C4-C5 through C6-C7 with degenerative spinal cord mass effect but no associated  cord signal abnormality. Electronically Signed   By: Genevie Ann M.D.   On: 04/28/2019 03:06   Mr Cervical Spine W  Wo Contrast  Result Date: 04/28/2019 CLINICAL DATA:  58 year old male with code stroke presentation, plain head CT suggesting metastatic disease to the brain. EXAM: MRI HEAD WITHOUT AND WITH CONTRAST MRI CERVICAL SPINE WITHOUT AND WITH CONTRAST TECHNIQUE: Multiplanar, multiecho pulse sequences of the brain and surrounding structures, and cervical spine, to include the craniocervical junction and cervicothoracic junction, were obtained without and with intravenous contrast. CONTRAST:  7.71mL GADAVIST GADOBUTROL 1 MMOL/ML IV SOLN COMPARISON:  Head CT 04/27/2019. FINDINGS: MRI HEAD FINDINGS Brain: Multiple enhancing brain metastases ranging from punctate to 26 millimeters diameter. Approximately 22 individual metastases are identified, with 1 or 2 additional questionable appearing areas (small left choroid plexus versus subependymal metastasis on series 38, image 28). There is an unusual trans-falcine lobulated and enhancing mass measuring about 23 millimeters (series 39, image 13) with microhemorrhage and an adjacent more cystic 12 centimeter metastasis. Associated mild regional dural thickening. All of the other lesions larger than 1 centimeter are cystic and rim enhancing. No other dural involvement or dural thickening identified. No leptomeningeal tumor identified. Confluent vasogenic edema in both frontal and parietal lobes as seen by CT earlier today. Regional mass effect with no midline shift or loss of basilar cisterns. Mass effect on both lateral ventricles with no ventriculomegaly or transependymal edema. No superimposed restricted diffusion suggestive of acute infarction. No acute intracranial hemorrhage. Cervicomedullary junction and pituitary are within normal limits. Vascular: Major intracranial vascular flow voids are preserved. The major dural venous sinuses are enhancing and appear to be  patent. Skull and upper cervical spine: Cervical spine reported separately. Calvarium bone marrow signal is within normal limits. Sinuses/Orbits: Negative orbits. Trace paranasal sinus mucosal thickening. Other: Mastoids are clear. Visible internal auditory structures appear normal. Scalp and face soft tissues appear negative. MRI CERVICAL SPINE FINDINGS Alignment: Mild straightening of cervical lordosis. No spondylolisthesis. Vertebrae: Abnormal marrow signal from the C3 vertebra into the visible upper thoracic spine, although little abnormal enhancement and marrow edema in the cervical vertebrae. A 12 millimeter T1 and T2 heterogeneous lesion in the anterior C3 body most resembles a benign hemangioma. There is a small enhancing metastasis in the superior right T1 body. There is subtotal T3 vertebral body replacement by enhancing tumor. Cord: Spinal cord signal is within normal limits at all visualized levels. Posterior Fossa, vertebral arteries, paraspinal tissues: Preserved major vascular flow voids in the neck. No cervical lymphadenopathy identified. Disc levels: No cervical spine epidural or extraosseous tumor identified. However, there does appear to be early ventral epidural space extension of tumor at the left T3 body. There is superimposed cervical spine degeneration which results in spinal stenosis with mild spinal cord mass effect at C4-C5, C5-C6, and C6-C7. No associated cord signal abnormality. IMPRESSION: 1. 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 Approximately 22 individual brain metastases ranging from punctate to 26 mm diameter, including an unusual trans-falcine lesion with some associated microhemorrhage and dural involvement. 2. Confluent cerebral vasogenic edema with no midline shift or loss of basilar cisterns. 3. Abnormal marrow signal throughout much of the cervical spine suspicious for widespread bone metastases although enhancing metastases are only confirmed in the visible upper  thoracic spine (including with early ventral epidural spread at T3). 4. Superimposed cervical spine degeneration with degenerative spinal stenosis at C4-C5 through C6-C7 with degenerative spinal cord mass effect but no associated cord signal abnormality. Electronically Signed   By: Genevie Ann M.D.   On: 04/28/2019 03:06   Ct Abdomen Pelvis W Contrast  Result Date: 04/28/2019 CLINICAL DATA:  Brain metastasis with unknown primary. EXAM: CT CHEST, ABDOMEN, AND PELVIS WITH CONTRAST TECHNIQUE: Multidetector CT imaging of the chest, abdomen and pelvis was performed following the standard protocol during bolus administration of intravenous contrast. CONTRAST:  100 cc Omnipaque 300 IV COMPARISON:  No prior chest or abdominal imaging. FINDINGS: CT CHEST FINDINGS Cardiovascular: Thoracic aorta is normal in caliber. Trace atherosclerosis of the aortic arch. Small amount pericardial fluid in the superior pericardial recess. Small coronary artery calcifications. Heart is normal in size. Mediastinum/Nodes: Probable 12 mm right hilar node, contiguous with right upper lobe perihilar mass. No enlarged mediastinal lymph nodes. No left hilar adenopathy. No supraclavicular adenopathy. Visualized thyroid gland is normal. Decompressed esophagus. Lungs/Pleura: Perihilar right upper lobe 5.3 x 4.6 x 3.7 cm pulmonary mass has irregular margins and surrounding ground-glass opacity. This causes mass effect and narrowing of the right upper lobe bronchus. Mass abuts the minor fissure with fissural thickening extending laterally. Mass abuts the major fissure with localized mass effect slight nodular protrusion a possible extension into the lower lobe, for example series 7, image 48. Biapical emphysema with bulla at the left lung apex. No additional pulmonary mass or nodule. Subpleural atelectasis in the dependent left lower lobe. Musculoskeletal: Bone marrow diffusely heterogeneous in density. Lytic lesion with posterior left T3 vertebral body  with posterior cortex involvement and extension to the spinal canal. Lytic lesion involving posterior right T1 vertebral body extending into the pedicle. T8 vertebral body lesion may represent a hemangioma but is nonspecific. Lytic lesion within T10 suspicious for metastatic disease. CT ABDOMEN PELVIS FINDINGS Hepatobiliary: Vague 8 mm low-density lesion in the left lobe of the liver, series 3, image 57. Focal fatty infiltration adjacent to the falciform ligament. Gallbladder is unremarkable. No biliary dilatation. Pancreas: Unremarkable. No pancreatic ductal dilatation or surrounding inflammatory changes. No evidence of pancreatic mass. Spleen: Normal in size without focal abnormality. Adrenals/Urinary Tract: No adrenal nodule. Left greater than right pelvicaliectasis and prominence of the ureters, likely related to bladder distension. No ureteral stones or cause of obstruction. Urinary bladder is distended without wall thickening. Homogeneous renal enhancement with symmetric excretion on delayed phase imaging. No evidence of focal renal mass. Stomach/Bowel: Stomach is within normal limits. Appendix appears normal. No evidence of bowel wall thickening, distention, or inflammatory changes. No evidence of colonic or small bowel mass. Vascular/Lymphatic: Aorto bi-iliac atherosclerosis. No aneurysm. Patent portal vein. No retroperitoneal adenopathy. No mesenteric or upper abdominal adenopathy. 7 mm right external iliac node is nonspecific. No enlarged inguinal nodes. Reproductive: Heterogeneous enlarged prostate gland spanning 5 cm. This causes mild mass effect on the bladder base. Other: No ascites, free air, or omental nodularity. Small fat containing umbilical hernia. Musculoskeletal: Bone marrow diffusely heterogeneous. Lytic lesion within L4 vertebral body with mild pathologic compression fracture. Lesion extends into the right pedicle. Lytic lesion within anterior L5 vertebral body. Lytic lesion within the right  superior pubic ramus extending to the puboacetabular junction. Suspected small central lytic lesion in the sacrum. Suspected small lytic lesion in the left anterior iliac bone and left iliac crest. IMPRESSION: 1. Right upper lobe perihilar mass measuring 5.3 x 4.6 x 3.7 cm consistent with primary bronchogenic malignancy. Mass abuts the major and minor minor fissure with possible extension into the lower lobe. 2. Probable 12 mm right hilar node, contiguous with the right upper lobe perihilar mass. 3. Osseous metastatic disease with multiple lytic lesions in the thoracic and lumbar spine, pelvis and sacrum. 4. Vague 8 mm low-density lesion in the left lobe  of the liver, nonspecific in the setting. 5. Enlarged prostate gland causing mild mass effect on the bladder base. 6. Urinary bladder and bilateral renal collecting system distention may be due to mass effect from enlarged prostate gland. Aortic Atherosclerosis (ICD10-I70.0) and Emphysema (ICD10-J43.9). Electronically Signed   By: Keith Rake M.D.   On: 04/28/2019 03:42   Ct Head Code Stroke Wo Contrast  Result Date: 04/27/2019 CLINICAL DATA:  Code stroke.  Right-sided weakness. EXAM: CT HEAD WITHOUT CONTRAST TECHNIQUE: Contiguous axial images were obtained from the base of the skull through the vertex without intravenous contrast. COMPARISON:  None. FINDINGS: Brain: Rounded low-density masses posteriorly in both frontal lobes near the vertex each measure 1.7 cm, and there is a 2.5 cm low-density mass more anteriorly in the left frontal lobe. There is extensive vasogenic edema in the left greater than right frontal lobes with regional sulcal effacement and slight mass effect on the lateral ventricles without midline shift. A small area of edema is noted in the right occipital lobe without a presumed underlying lesion visible on this unenhanced study. No acute cortically based infarct, intracranial hemorrhage, or extra-axial fluid collection is identified.  Vascular: No hyperdense vessel. Skull: No fracture or focal osseous lesion. Sinuses/Orbits: Visualized paranasal sinuses and mastoid air cells are clear. Orbits are unremarkable. Other: None. ASPECTS [facility] Stroke Program Early CT Score) Not scored due to the presence of multiple cerebral masses. IMPRESSION: 1. 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 Brain MRI without and with contrast is recommended for further evaluation. 2. No midline shift or intracranial hemorrhage. These results were called by telephone at the time of interpretation on 04/27/2019 at 6:45 pm to Dr. Ezequiel Essex, who verbally acknowledged these results. Electronically Signed   By: Logan Bores M.D.   On: 04/27/2019 18:47    ASSESSMENT AND PLAN: 1.  Lung mass, bone lesions, and brain lesions highly suspicious for lung cancer 2.  Mild normocytic anemia 3.  Leukocytosis secondary to dexamethasone 4.  Tobacco dependence  -Discussed with the patient that his repeat biopsy was again nondiagnostic.  Recommend biopsy of one of his bone lesions by interventional radiology.  If he is otherwise stable for discharge, this can be done as an outpatient. I have entered an order for a CT guided biopsy of one of his bone lesions in IR to be performed early next week and to coordinate time around his Radiation treatments due to transportation.  -He will begin whole brain radiation later today.  He may discharge to home if otherwise medically stable after his radiation.  Continue dexamethasone 4 mg every 8 hours with food.  His dexamethasone can be tapered as an outpatient by radiation oncology. -The patient is scheduled for outpatient follow-up with medical oncology at [facility] on 05/18/2019.  He was advised to keep this appointment. -He was again counseled about smoking cessation.   LOS: 7 days   Mikey Bussing, DNP, AGPCNP-BC, AOCNP 05/04/19

## 2019-05-04 NOTE — Progress Notes (Signed)
[facility] CSW Progress Notes  Request received to help patient w transportation to [facility] for radiation appointments beginning next week.  Called patient - states his girlfriend is taking care of her elderly father and is not available to assist.  Agreeable to CSW calling his sister [place].  [place], she is willing to arrange transport for patient beginning Monday - will ask her sisters to assist.  CSW Wallis Bamberg will meet patient on Monday and provide gas cards from [employer] to assist w cost of gas.  Patient informed of this plan.  Edwyna Shell, LCSW Clinical Social Worker Phone:  [phone]

## 2019-05-09 ENCOUNTER — Encounter (HOSPITAL_COMMUNITY): Payer: Self-pay | Admitting: Radiology

## 2019-05-09 ENCOUNTER — Other Ambulatory Visit: Payer: Self-pay | Admitting: Radiation Oncology

## 2019-05-09 ENCOUNTER — Other Ambulatory Visit: Payer: Self-pay

## 2019-05-09 ENCOUNTER — Ambulatory Visit
Admission: RE | Admit: 2019-05-09 | Discharge: 2019-05-09 | Disposition: A | Discharge status: Home / Self Care | Payer: Medicaid Other | Source: Ambulatory Visit | Attending: Radiation Oncology | Admitting: Radiation Oncology

## 2019-05-09 DIAGNOSIS — F17210 Nicotine dependence, cigarettes, uncomplicated: Secondary | ICD-10-CM | POA: Diagnosis not present

## 2019-05-09 DIAGNOSIS — C7931 Secondary malignant neoplasm of brain: Secondary | ICD-10-CM | POA: Diagnosis not present

## 2019-05-09 DIAGNOSIS — C3490 Malignant neoplasm of unspecified part of unspecified bronchus or lung: Secondary | ICD-10-CM

## 2019-05-09 DIAGNOSIS — C7951 Secondary malignant neoplasm of bone: Secondary | ICD-10-CM | POA: Diagnosis not present

## 2019-05-09 DIAGNOSIS — D509 Iron deficiency anemia, unspecified: Secondary | ICD-10-CM | POA: Diagnosis not present

## 2019-05-09 DIAGNOSIS — I10 Essential (primary) hypertension: Secondary | ICD-10-CM | POA: Diagnosis not present

## 2019-05-09 DIAGNOSIS — C3411 Malignant neoplasm of upper lobe, right bronchus or lung: Secondary | ICD-10-CM | POA: Diagnosis not present

## 2019-05-09 NOTE — Progress Notes (Signed)
Brandon Ferguson Male, 58 y.o., February 28, 1961 MRN:  [id number] Phone:  [phone] ([phone] PCP:  Patient, No Pcp Per Coverage:  None Next Appt With Radiation Oncology 05/09/2019 at 11:35 AM  RE: CT Biopsy Received: 5 days ago Message Contents  Greggory Keen, MD  [name] for CT bx RTposterior iliac bone met. CT im100/series3   Would do similar to a BM bx and core with the 11 g drill set    TS   Previous Messages  ----- Message -----  From: [name] D  Sent: 05/04/2019  3:02 PM EST  To: [facility] Procedure Requests  Subject: CT Biopsy                     Procedure:  CT Biopsy   Reason: Metastatic cancer to brain, Lung mass, brain mets, bone mets. Bronch has been non diagnostic X 2. request biopsy of one of his bone lesions.    History: MR, CT in computer   NP, [name]  [place]

## 2019-05-10 ENCOUNTER — Other Ambulatory Visit: Payer: Self-pay

## 2019-05-10 ENCOUNTER — Ambulatory Visit: Admission: RE | Admit: 2019-05-10 | Payer: Medicaid Other | Source: Ambulatory Visit

## 2019-05-10 ENCOUNTER — Ambulatory Visit
Admission: RE | Admit: 2019-05-10 | Discharge: 2019-05-10 | Disposition: A | Discharge status: Home / Self Care | Payer: Medicaid Other | Source: Ambulatory Visit | Attending: Radiation Oncology | Admitting: Radiation Oncology

## 2019-05-10 ENCOUNTER — Encounter: Payer: Self-pay | Admitting: Nutrition

## 2019-05-10 ENCOUNTER — Telehealth: Payer: Self-pay | Admitting: *Deleted

## 2019-05-10 DIAGNOSIS — F17210 Nicotine dependence, cigarettes, uncomplicated: Secondary | ICD-10-CM | POA: Diagnosis not present

## 2019-05-10 DIAGNOSIS — C7931 Secondary malignant neoplasm of brain: Secondary | ICD-10-CM | POA: Diagnosis present

## 2019-05-10 DIAGNOSIS — C3411 Malignant neoplasm of upper lobe, right bronchus or lung: Secondary | ICD-10-CM | POA: Insufficient documentation

## 2019-05-10 DIAGNOSIS — C7951 Secondary malignant neoplasm of bone: Secondary | ICD-10-CM | POA: Insufficient documentation

## 2019-05-10 DIAGNOSIS — I10 Essential (primary) hypertension: Secondary | ICD-10-CM | POA: Insufficient documentation

## 2019-05-10 DIAGNOSIS — D509 Iron deficiency anemia, unspecified: Secondary | ICD-10-CM | POA: Diagnosis not present

## 2019-05-10 NOTE — Progress Notes (Signed)
Provided one complimentary case ensure enlive.

## 2019-05-10 NOTE — Telephone Encounter (Signed)
CALLED PATIENT TO INFORM OF PET SCAN FOR 05-17-19 - ARRIVAL TIME- 11:30 AM @ [facility], PT. TO BE NPO- 6 HRS. PRIOR TO TEST, SPOKE WITH PATIENT AND HE IS AWARE OF THIS TEST

## 2019-05-11 ENCOUNTER — Ambulatory Visit
Admission: RE | Admit: 2019-05-11 | Discharge: 2019-05-11 | Disposition: A | Discharge status: Home / Self Care | Payer: Medicaid Other | Source: Ambulatory Visit | Attending: Radiation Oncology | Admitting: Radiation Oncology

## 2019-05-11 ENCOUNTER — Other Ambulatory Visit: Payer: Self-pay

## 2019-05-11 ENCOUNTER — Ambulatory Visit: Admission: RE | Admit: 2019-05-11 | Payer: Medicaid Other | Source: Ambulatory Visit

## 2019-05-11 DIAGNOSIS — C7931 Secondary malignant neoplasm of brain: Secondary | ICD-10-CM | POA: Diagnosis not present

## 2019-05-12 ENCOUNTER — Ambulatory Visit
Admission: RE | Admit: 2019-05-12 | Discharge: 2019-05-12 | Disposition: A | Discharge status: Home / Self Care | Payer: Medicaid Other | Source: Ambulatory Visit | Attending: Radiation Oncology | Admitting: Radiation Oncology

## 2019-05-12 ENCOUNTER — Other Ambulatory Visit: Payer: Self-pay

## 2019-05-12 ENCOUNTER — Ambulatory Visit: Admission: RE | Admit: 2019-05-12 | Payer: Medicaid Other | Source: Ambulatory Visit

## 2019-05-12 DIAGNOSIS — C7931 Secondary malignant neoplasm of brain: Secondary | ICD-10-CM | POA: Diagnosis not present

## 2019-05-12 DIAGNOSIS — C7951 Secondary malignant neoplasm of bone: Secondary | ICD-10-CM | POA: Diagnosis not present

## 2019-05-12 DIAGNOSIS — C3411 Malignant neoplasm of upper lobe, right bronchus or lung: Secondary | ICD-10-CM | POA: Diagnosis not present

## 2019-05-13 ENCOUNTER — Other Ambulatory Visit: Payer: Self-pay | Admitting: Radiation Therapy

## 2019-05-13 ENCOUNTER — Other Ambulatory Visit: Payer: Self-pay

## 2019-05-13 ENCOUNTER — Ambulatory Visit
Admission: RE | Admit: 2019-05-13 | Discharge: 2019-05-13 | Disposition: A | Discharge status: Home / Self Care | Payer: Medicaid Other | Source: Ambulatory Visit | Attending: Radiation Oncology | Admitting: Radiation Oncology

## 2019-05-13 DIAGNOSIS — C7931 Secondary malignant neoplasm of brain: Secondary | ICD-10-CM | POA: Diagnosis not present

## 2019-05-16 ENCOUNTER — Ambulatory Visit
Admission: RE | Admit: 2019-05-16 | Discharge: 2019-05-16 | Disposition: A | Discharge status: Home / Self Care | Payer: Medicaid Other | Source: Ambulatory Visit | Attending: Radiation Oncology | Admitting: Radiation Oncology

## 2019-05-16 ENCOUNTER — Other Ambulatory Visit: Payer: Self-pay

## 2019-05-16 DIAGNOSIS — C7931 Secondary malignant neoplasm of brain: Secondary | ICD-10-CM | POA: Diagnosis not present

## 2019-05-17 ENCOUNTER — Ambulatory Visit (HOSPITAL_COMMUNITY)
Admission: RE | Admit: 2019-05-17 | Discharge: 2019-05-17 | Disposition: A | Discharge status: Home / Self Care | Payer: Medicaid Other | Source: Ambulatory Visit | Attending: Radiation Oncology | Admitting: Radiation Oncology

## 2019-05-17 ENCOUNTER — Ambulatory Visit
Admission: RE | Admit: 2019-05-17 | Discharge: 2019-05-17 | Disposition: A | Discharge status: Home / Self Care | Payer: Medicaid Other | Source: Ambulatory Visit | Attending: Radiation Oncology | Admitting: Radiation Oncology

## 2019-05-17 ENCOUNTER — Ambulatory Visit: Payer: Medicaid Other

## 2019-05-17 ENCOUNTER — Other Ambulatory Visit: Payer: Self-pay

## 2019-05-17 DIAGNOSIS — C7931 Secondary malignant neoplasm of brain: Secondary | ICD-10-CM | POA: Diagnosis not present

## 2019-05-17 DIAGNOSIS — C3490 Malignant neoplasm of unspecified part of unspecified bronchus or lung: Secondary | ICD-10-CM | POA: Diagnosis present

## 2019-05-17 LAB — GLUCOSE, CAPILLARY
Glucose-Capillary: 109 mg/dL — ABNORMAL HIGH (ref 70–99)
Glucose-Capillary: 111 mg/dL — ABNORMAL HIGH (ref 70–99)

## 2019-05-17 MED ORDER — FLUDEOXYGLUCOSE F - 18 (FDG) INJECTION
7.3000 | Freq: Once | INTRAVENOUS | Status: AC | PRN
  Administered 2019-05-17: 7.3 via INTRAVENOUS

## 2019-05-18 ENCOUNTER — Inpatient Hospital Stay: Payer: MEDICAID | Attending: Radiation Oncology | Admitting: Nutrition

## 2019-05-18 ENCOUNTER — Encounter (HOSPITAL_COMMUNITY): Payer: Self-pay | Admitting: Hematology

## 2019-05-18 ENCOUNTER — Ambulatory Visit
Admission: RE | Admit: 2019-05-18 | Discharge: 2019-05-18 | Disposition: A | Discharge status: Home / Self Care | Payer: Medicaid Other | Source: Ambulatory Visit | Attending: Radiation Oncology | Admitting: Radiation Oncology

## 2019-05-18 ENCOUNTER — Inpatient Hospital Stay (HOSPITAL_COMMUNITY): Payer: Medicaid Other | Attending: Hematology | Admitting: Hematology

## 2019-05-18 VITALS — BP 122/66 | HR 67 | Temp 96.2°F | Resp 18 | Ht 72.0 in | Wt 152.1 lb

## 2019-05-18 DIAGNOSIS — Z79899 Other long term (current) drug therapy: Secondary | ICD-10-CM | POA: Insufficient documentation

## 2019-05-18 DIAGNOSIS — R918 Other nonspecific abnormal finding of lung field: Secondary | ICD-10-CM

## 2019-05-18 DIAGNOSIS — C3411 Malignant neoplasm of upper lobe, right bronchus or lung: Secondary | ICD-10-CM | POA: Diagnosis not present

## 2019-05-18 DIAGNOSIS — C7951 Secondary malignant neoplasm of bone: Secondary | ICD-10-CM | POA: Diagnosis not present

## 2019-05-18 DIAGNOSIS — C7931 Secondary malignant neoplasm of brain: Secondary | ICD-10-CM

## 2019-05-18 DIAGNOSIS — M549 Dorsalgia, unspecified: Secondary | ICD-10-CM | POA: Insufficient documentation

## 2019-05-18 NOTE — Patient Instructions (Signed)
[place] at [facility] Discharge Instructions  You were seen today by [name]. He went over your history, family history and how you've been feeling since starting your radiation. He will see you back after your biopsy for follow up.   Thank you for choosing [place] at [facility] to provide your oncology and hematology care.  To afford each patient quality time with our provider, please arrive at least 15 minutes before your scheduled appointment time.   If you have a lab appointment with the [facility] please come in thru the  Main Entrance and check in at the main information desk  You need to re-schedule your appointment should you arrive 10 or more minutes late.  We strive to give you quality time with our providers, and arriving late affects you and other patients whose appointments are after yours.  Also, if you no show three or more times for appointments you may be dismissed from the clinic at the providers discretion.     Again, thank you for choosing [facility].  Our hope is that these requests will decrease the amount of time that you wait before being seen by our physicians.       _____________________________________________________________  Should you have questions after your visit to [facility], please contact our office at [phone] between the hours of 8:00 a.m. and 4:30 p.m.  Voicemails left after 4:00 p.m. will not be returned until the following business day.  For prescription refill requests, have your pharmacy contact our office and allow 72 hours.    Cancer Center Support Programs:   > Cancer Support Group  2nd Tuesday of the month 1pm-2pm, Journey Room

## 2019-05-18 NOTE — Progress Notes (Signed)
Nutrition Assessment   Reason for Assessment:  Request for ensure, weight loss   ASSESSMENT: 58 year old male with non small cell lung cancer with  Mets to brain.  Past medical history of Fe and Vit D deficiency.  Patient receiving radiation last treatment planned for 12/10.  Planning to start likely chemotherapy at [facility].  Met with patient and sister in clinic today.  Patient reports that appetite has been decreased especially during hospital admission.  Did not like the food that was served.  Reports that he prepares his own meals.  Works at [employer].  Usually skips breakfast, eats burgers for lunch. Supper something like spaghetti.  Has limited income for food.  Reports that he drinks ensure when he can afford it.    Reports that since being home appetite has improved.      Nutrition Focused Physical Exam: deferred   Medications: decadron, ferrous sulfate, folvite, protonix, miralax, thiamine.     Labs: 11/25 Na 130, glucose 154, creatine 0.47   Anthropometrics:   Height: 72 inches Weight: 152 lb 1.6 oz today UBW: 165 lb per patient report BMI: 20  Noted weight of 138 lb on 11/25 (hospital discharge).     Estimated Energy Needs  Kcals: 2000-2400 Protein: 100-120 g Fluid: > 2 L   NUTRITION DIAGNOSIS: Inadequate oral intake related to recent hospital admission, cancer as evidenced by weight loss per patient report and poor appetite but now improved   INTERVENTION:  Discussed importance of good nutrition during treatment and weight maintenance. Discussed importance of high calorie and high protein foods. Handout provided Provided case of ensure enlive for patient along with food bag. Patient appreciative.   Contact information provided   MONITORING, EVALUATION, GOAL:  Patient will consume adequate calories and protein to maintain weight during treatment  Next Visit: phone f/u Jan 8th on [place].  Written appointment givein to  patient  Winna Golla B. Zenia Resides, [place], [place] Registered Dietitian [phone] (pager)

## 2019-05-19 ENCOUNTER — Other Ambulatory Visit: Payer: Self-pay | Admitting: Physician Assistant

## 2019-05-19 ENCOUNTER — Ambulatory Visit
Admission: RE | Admit: 2019-05-19 | Discharge: 2019-05-19 | Disposition: A | Discharge status: Home / Self Care | Payer: Medicaid Other | Source: Ambulatory Visit | Attending: Radiation Oncology | Admitting: Radiation Oncology

## 2019-05-19 ENCOUNTER — Encounter: Payer: Self-pay | Admitting: Radiation Oncology

## 2019-05-19 ENCOUNTER — Other Ambulatory Visit: Payer: Self-pay

## 2019-05-19 ENCOUNTER — Encounter (HOSPITAL_COMMUNITY): Payer: Self-pay | Admitting: Hematology

## 2019-05-19 DIAGNOSIS — C3411 Malignant neoplasm of upper lobe, right bronchus or lung: Secondary | ICD-10-CM | POA: Diagnosis not present

## 2019-05-19 DIAGNOSIS — C7931 Secondary malignant neoplasm of brain: Secondary | ICD-10-CM | POA: Diagnosis not present

## 2019-05-19 DIAGNOSIS — C7951 Secondary malignant neoplasm of bone: Secondary | ICD-10-CM | POA: Diagnosis not present

## 2019-05-19 NOTE — Progress Notes (Signed)
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  HISTORY OF PRESENTING ILLNESS:  Brandon Ferguson 58 y.o. male is seen in consultation today for further work-up and management of newly diagnosed metastatic lung cancer.  Brandon Ferguson is a 58 year old very pleasant African-American male who was working at [employer].  He noticed that his right arm was weak and presented to the ER.  On 04/28/2019 an MRI of the brain showed widespread metastatic disease, approximately 22 individual brain metastasis ranging from punctate to 2.6 cm with vasogenic edema.  He was hospitalized and started on radiation therapy after dexamethasone instituted.  He underwent bronchoscopy and biopsy x2, unfortunately both times it returned as benign tissue.  He is released from the hospital and is continuing outpatient radiation therapy to the brain.  He will complete it tomorrow.  Last about 8 pounds in the last couple of months.  Denies any cough or hemoptysis.  His right upper extremity strength has improved.  He is currently on dexamethasone 4 mg twice daily.  He reports back pain for the past month.  He takes Tylenol 1 tablet/day which helps.  He lives by himself at home.  Does not have any children.  Family history significant for brother with lung cancer metastatic to the brain.  He reported appetite of 400% and energy levels of 100%.  Pain in the back is reported as 4 out of 5.  Denies any tingling or numbness in extremities.  Denies any fevers, night sweats or weight loss.  MEDICAL HISTORY:  Past Medical History:  Diagnosis Date  . Iron deficiency   . Vitamin D deficiency     SURGICAL HISTORY: Past Surgical History:  Procedure Laterality Date  . KNEE SURGERY    . VIDEO BRONCHOSCOPY Bilateral 04/29/2019   Procedure: VIDEO BRONCHOSCOPY WITH  FLUORO;  Surgeon: Laurin Coder, MD;  Location: [facility];  Service: Endoscopy;  Laterality: Bilateral;  . VIDEO BRONCHOSCOPY Bilateral 05/03/2019   Procedure: VIDEO BRONCHOSCOPY WITHOUT FLUORO;  Surgeon: Rigoberto Noel, MD;  Location: [facility];  Service: Cardiopulmonary;  Laterality: Bilateral;    SOCIAL HISTORY: Social History   Socioeconomic History  . Marital status: Single    Spouse name: Not on file  . Number of children: Not on file  . Years of education: Not on file  . Highest education level: Not on file  Occupational History  . Not on file  Tobacco Use  . Smoking status: Current Every Day Smoker    Packs/day: 1.00    Types: Cigarettes  . Smokeless tobacco: Never Used  Substance and Sexual Activity  . Alcohol use: Yes    Alcohol/week: 6.0 standard drinks    Types: 6 Cans of beer per week    Comment: pt drinks 3 large beers daily  . Drug use: Yes    Types: Marijuana    Comment: daily  . Sexual activity: Not on file  Other Topics Concern  . Not on file  Social History Narrative  . Not on file   Social Determinants of Health   Financial Resource Strain: Low Risk   . Difficulty of Paying Living Expenses: Not very hard  Food Insecurity: No Food Insecurity  . Worried About Charity fundraiser in the Last Year: Never true  . Ran Out of Food in the Last Year: Never true  Transportation Needs: No Transportation Needs  . Lack of Transportation (Medical): No  . Lack of Transportation (Non-Medical): No  Physical Activity: Inactive  . Days of Exercise per Week: 0 days  . Minutes of Exercise per Session: 0 min  Stress: No Stress Concern Present  . Feeling of Stress : Only a little  Social Connections: Somewhat Isolated  . Frequency of Communication with Friends and Family: More than three times a week  . Frequency of Social Gatherings with Friends and Family: More than three times a week  . Attends Religious Services: 1 to 4 times per year  . Active Member  of Clubs or Organizations: No  . Attends Archivist Meetings: Never  . Marital Status: Never married  Intimate Partner Violence: Not At Risk  . Fear of Current or Ex-Partner: No  . Emotionally Abused: No  . Physically Abused: No  . Sexually Abused: No    FAMILY HISTORY: Family History  Problem Relation Age of Onset  . Breast cancer Mother   . Cirrhosis Father   . Alcohol abuse Father   . Hypertension Sister   . Cancer Brother   . Hypertension Sister   . Hypertension Sister   . Hypertension Sister   . Hypertension Sister     ALLERGIES:  has No Known Allergies.  MEDICATIONS:  Current Outpatient Medications  Medication Sig Dispense Refill  . dexamethasone (DECADRON) 4 MG tablet Take 1 tablet (4 mg total) by mouth 2 (two) times daily. 60 tablet 0  . ferrous sulfate 325 (65 FE) MG EC tablet Take 325 mg by mouth daily.    . folic acid (FOLVITE) 1 MG tablet Take 1 tablet (1 mg total) by mouth daily. 30 tablet 0  . levETIRAcetam (KEPPRA) 500 MG tablet Take 1 tablet (500 mg total) by mouth 2 (two) times daily. 60 tablet 0  . pantoprazole (PROTONIX) 40 MG tablet Take 1 tablet (40 mg total) by mouth daily. 30 tablet 0  . thiamine 100 MG tablet Take 1 tablet (100 mg total) by mouth daily. 30 tablet 0  . acetaminophen (TYLENOL) 500 MG tablet Take 500 mg by mouth every 6 (six) hours as needed for headache.    . naproxen sodium (ALEVE) 220 MG tablet Take 220 mg by mouth 2 (two) times daily as needed (for headache).    . polyethylene glycol (MIRALAX / GLYCOLAX) 17 g packet Take 17 g by mouth daily as needed. (Patient not taking: Reported on 05/18/2019) 14 each 0   No current facility-administered medications for this visit.    REVIEW OF SYSTEMS:   Constitutional: Denies fevers, chills or abnormal night sweats Eyes: Denies blurriness of vision, double vision or watery eyes Ears, nose, mouth, throat, and face: Denies mucositis or sore throat Respiratory: Denies cough, dyspnea or  wheezes Cardiovascular: Denies palpitation, chest discomfort or lower extremity swelling Gastrointestinal:  Denies nausea, heartburn or change in bowel habits Skin: Denies abnormal skin rashes Lymphatics: Denies new lymphadenopathy or easy bruising Neurological:Denies numbness, tingling or new weaknesses Behavioral/Psych: Mood is stable, no new changes  All other systems were reviewed with the patient and are negative.  PHYSICAL EXAMINATION: ECOG PERFORMANCE STATUS: 1 - Symptomatic but completely ambulatory  Vitals:   05/18/19 1316  BP: 122/66  Pulse: 67  Resp: 18  Temp: (!) 96.2 F (35.7 C)  SpO2: 100%   Filed Weights   05/18/19 1316  Weight: 152 lb 1.6 oz (69 kg)    GENERAL:alert, no distress and comfortable SKIN: skin  color, texture, turgor are normal, no rashes or significant lesions EYES: normal, conjunctiva are pink and non-injected, sclera clear OROPHARYNX:no exudate, no erythema and lips, buccal mucosa, and tongue normal  NECK: supple, thyroid normal size, non-tender, without nodularity LYMPH:  no palpable lymphadenopathy in the cervical, axillary or inguinal LUNGS: clear to auscultation and percussion with normal breathing effort HEART: regular rate & rhythm and no murmurs and no lower extremity edema ABDOMEN:abdomen soft, non-tender and normal bowel sounds Musculoskeletal:no cyanosis of digits and no clubbing  PSYCH: alert & oriented x 3 with fluent speech NEURO: no focal motor/sensory deficits -Muscle strength is 5 out of 5 in the upper extremities.  Muscle strength decreased with the right hip flexion.  LABORATORY DATA:  I have reviewed the data as listed Lab Results  Component Value Date   WBC 14.8 (H) 05/04/2019   HGB 12.6 (L) 05/04/2019   HCT 39.8 05/04/2019   MCV 92.8 05/04/2019   PLT 360 05/04/2019     Chemistry      Component Value Date/Time   NA 130 (L) 05/04/2019 0531   K 4.1 05/04/2019 0531   CL 98 05/04/2019 0531   CO2 24 05/04/2019 0531    BUN 19 05/04/2019 0531   CREATININE 0.47 (L) 05/04/2019 0531      Component Value Date/Time   CALCIUM 9.6 05/04/2019 0531   ALKPHOS 100 04/27/2019 1835   AST 20 04/27/2019 1835   ALT 14 04/27/2019 1835   BILITOT 0.6 04/27/2019 1835       RADIOGRAPHIC STUDIES: I have personally reviewed the radiological images as listed and agreed with the findings in the report. CT CHEST W CONTRAST  Result Date: 04/28/2019 CLINICAL DATA:  Brain metastasis with unknown primary. EXAM: CT CHEST, ABDOMEN, AND PELVIS WITH CONTRAST TECHNIQUE: Multidetector CT imaging of the chest, abdomen and pelvis was performed following the standard protocol during bolus administration of intravenous contrast. CONTRAST:  100 cc Omnipaque 300 IV COMPARISON:  No prior chest or abdominal imaging. FINDINGS: CT CHEST FINDINGS Cardiovascular: Thoracic aorta is normal in caliber. Trace atherosclerosis of the aortic arch. Small amount pericardial fluid in the superior pericardial recess. Small coronary artery calcifications. Heart is normal in size. Mediastinum/Nodes: Probable 12 mm right hilar node, contiguous with right upper lobe perihilar mass. No enlarged mediastinal lymph nodes. No left hilar adenopathy. No supraclavicular adenopathy. Visualized thyroid gland is normal. Decompressed esophagus. Lungs/Pleura: Perihilar right upper lobe 5.3 x 4.6 x 3.7 cm pulmonary mass has irregular margins and surrounding ground-glass opacity. This causes mass effect and narrowing of the right upper lobe bronchus. Mass abuts the minor fissure with fissural thickening extending laterally. Mass abuts the major fissure with localized mass effect slight nodular protrusion a possible extension into the lower lobe, for example series 7, image 48. Biapical emphysema with bulla at the left lung apex. No additional pulmonary mass or nodule. Subpleural atelectasis in the dependent left lower lobe. Musculoskeletal: Bone marrow diffusely heterogeneous in density.  Lytic lesion with posterior left T3 vertebral body with posterior cortex involvement and extension to the spinal canal. Lytic lesion involving posterior right T1 vertebral body extending into the pedicle. T8 vertebral body lesion may represent a hemangioma but is nonspecific. Lytic lesion within T10 suspicious for metastatic disease. CT ABDOMEN PELVIS FINDINGS Hepatobiliary: Vague 8 mm low-density lesion in the left lobe of the liver, series 3, image 57. Focal fatty infiltration adjacent to the falciform ligament. Gallbladder is unremarkable. No biliary dilatation. Pancreas: Unremarkable. No pancreatic ductal dilatation or surrounding  inflammatory changes. No evidence of pancreatic mass. Spleen: Normal in size without focal abnormality. Adrenals/Urinary Tract: No adrenal nodule. Left greater than right pelvicaliectasis and prominence of the ureters, likely related to bladder distension. No ureteral stones or cause of obstruction. Urinary bladder is distended without wall thickening. Homogeneous renal enhancement with symmetric excretion on delayed phase imaging. No evidence of focal renal mass. Stomach/Bowel: Stomach is within normal limits. Appendix appears normal. No evidence of bowel wall thickening, distention, or inflammatory changes. No evidence of colonic or small bowel mass. Vascular/Lymphatic: Aorto bi-iliac atherosclerosis. No aneurysm. Patent portal vein. No retroperitoneal adenopathy. No mesenteric or upper abdominal adenopathy. 7 mm right external iliac node is nonspecific. No enlarged inguinal nodes. Reproductive: Heterogeneous enlarged prostate gland spanning 5 cm. This causes mild mass effect on the bladder base. Other: No ascites, free air, or omental nodularity. Small fat containing umbilical hernia. Musculoskeletal: Bone marrow diffusely heterogeneous. Lytic lesion within L4 vertebral body with mild pathologic compression fracture. Lesion extends into the right pedicle. Lytic lesion within  anterior L5 vertebral body. Lytic lesion within the right superior pubic ramus extending to the puboacetabular junction. Suspected small central lytic lesion in the sacrum. Suspected small lytic lesion in the left anterior iliac bone and left iliac crest. IMPRESSION: 1. Right upper lobe perihilar mass measuring 5.3 x 4.6 x 3.7 cm consistent with primary bronchogenic malignancy. Mass abuts the major and minor minor fissure with possible extension into the lower lobe. 2. Probable 12 mm right hilar node, contiguous with the right upper lobe perihilar mass. 3. Osseous metastatic disease with multiple lytic lesions in the thoracic and lumbar spine, pelvis and sacrum. 4. Vague 8 mm low-density lesion in the left lobe of the liver, nonspecific in the setting. 5. Enlarged prostate gland causing mild mass effect on the bladder base. 6. Urinary bladder and bilateral renal collecting system distention may be due to mass effect from enlarged prostate gland. Aortic Atherosclerosis (ICD10-I70.0) and Emphysema (ICD10-J43.9). Electronically Signed   By: Keith Rake M.D.   On: 04/28/2019 03:42   MR BRAIN W WO CONTRAST  Result Date: 04/28/2019 CLINICAL DATA:  58 year old male with code stroke presentation, plain head CT suggesting metastatic disease to the brain. EXAM: MRI HEAD WITHOUT AND WITH CONTRAST MRI CERVICAL SPINE WITHOUT AND WITH CONTRAST TECHNIQUE: Multiplanar, multiecho pulse sequences of the brain and surrounding structures, and cervical spine, to include the craniocervical junction and cervicothoracic junction, were obtained without and with intravenous contrast. CONTRAST:  7.65mL GADAVIST GADOBUTROL 1 MMOL/ML IV SOLN COMPARISON:  Head CT 04/27/2019. FINDINGS: MRI HEAD FINDINGS Brain: Multiple enhancing brain metastases ranging from punctate to 26 millimeters diameter. Approximately 22 individual metastases are identified, with 1 or 2 additional questionable appearing areas (small left choroid plexus versus  subependymal metastasis on series 38, image 28). There is an unusual trans-falcine lobulated and enhancing mass measuring about 23 millimeters (series 39, image 13) with microhemorrhage and an adjacent more cystic 12 centimeter metastasis. Associated mild regional dural thickening. All of the other lesions larger than 1 centimeter are cystic and rim enhancing. No other dural involvement or dural thickening identified. No leptomeningeal tumor identified. Confluent vasogenic edema in both frontal and parietal lobes as seen by CT earlier today. Regional mass effect with no midline shift or loss of basilar cisterns. Mass effect on both lateral ventricles with no ventriculomegaly or transependymal edema. No superimposed restricted diffusion suggestive of acute infarction. No acute intracranial hemorrhage. Cervicomedullary junction and pituitary are within normal limits. Vascular: Major intracranial vascular  flow voids are preserved. The major dural venous sinuses are enhancing and appear to be patent. Skull and upper cervical spine: Cervical spine reported separately. Calvarium bone marrow signal is within normal limits. Sinuses/Orbits: Negative orbits. Trace paranasal sinus mucosal thickening. Other: Mastoids are clear. Visible internal auditory structures appear normal. Scalp and face soft tissues appear negative. MRI CERVICAL SPINE FINDINGS Alignment: Mild straightening of cervical lordosis. No spondylolisthesis. Vertebrae: Abnormal marrow signal from the C3 vertebra into the visible upper thoracic spine, although little abnormal enhancement and marrow edema in the cervical vertebrae. A 12 millimeter T1 and T2 heterogeneous lesion in the anterior C3 body most resembles a benign hemangioma. There is a small enhancing metastasis in the superior right T1 body. There is subtotal T3 vertebral body replacement by enhancing tumor. Cord: Spinal cord signal is within normal limits at all visualized levels. Posterior Fossa,  vertebral arteries, paraspinal tissues: Preserved major vascular flow voids in the neck. No cervical lymphadenopathy identified. Disc levels: No cervical spine epidural or extraosseous tumor identified. However, there does appear to be early ventral epidural space extension of tumor at the left T3 body. There is superimposed cervical spine degeneration which results in spinal stenosis with mild spinal cord mass effect at C4-C5, C5-C6, and C6-C7. No associated cord signal abnormality. IMPRESSION: 1. 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 Approximately 22 individual brain metastases ranging from punctate to 26 mm diameter, including an unusual trans-falcine lesion with some associated microhemorrhage and dural involvement. 2. Confluent cerebral vasogenic edema with no midline shift or loss of basilar cisterns. 3. Abnormal marrow signal throughout much of the cervical spine suspicious for widespread bone metastases although enhancing metastases are only confirmed in the visible upper thoracic spine (including with early ventral epidural spread at T3). 4. Superimposed cervical spine degeneration with degenerative spinal stenosis at C4-C5 through C6-C7 with degenerative spinal cord mass effect but no associated cord signal abnormality. Electronically Signed   By: Genevie Ann M.D.   On: 04/28/2019 03:06   MR CERVICAL SPINE W WO CONTRAST  Result Date: 04/28/2019 CLINICAL DATA:  58 year old male with code stroke presentation, plain head CT suggesting metastatic disease to the brain. EXAM: MRI HEAD WITHOUT AND WITH CONTRAST MRI CERVICAL SPINE WITHOUT AND WITH CONTRAST TECHNIQUE: Multiplanar, multiecho pulse sequences of the brain and surrounding structures, and cervical spine, to include the craniocervical junction and cervicothoracic junction, were obtained without and with intravenous contrast. CONTRAST:  7.45mL GADAVIST GADOBUTROL 1 MMOL/ML IV SOLN COMPARISON:  Head CT 04/27/2019. FINDINGS: MRI HEAD FINDINGS Brain:  Multiple enhancing brain metastases ranging from punctate to 26 millimeters diameter. Approximately 22 individual metastases are identified, with 1 or 2 additional questionable appearing areas (small left choroid plexus versus subependymal metastasis on series 38, image 28). There is an unusual trans-falcine lobulated and enhancing mass measuring about 23 millimeters (series 39, image 13) with microhemorrhage and an adjacent more cystic 12 centimeter metastasis. Associated mild regional dural thickening. All of the other lesions larger than 1 centimeter are cystic and rim enhancing. No other dural involvement or dural thickening identified. No leptomeningeal tumor identified. Confluent vasogenic edema in both frontal and parietal lobes as seen by CT earlier today. Regional mass effect with no midline shift or loss of basilar cisterns. Mass effect on both lateral ventricles with no ventriculomegaly or transependymal edema. No superimposed restricted diffusion suggestive of acute infarction. No acute intracranial hemorrhage. Cervicomedullary junction and pituitary are within normal limits. Vascular: Major intracranial vascular flow voids are preserved. The major dural venous  sinuses are enhancing and appear to be patent. Skull and upper cervical spine: Cervical spine reported separately. Calvarium bone marrow signal is within normal limits. Sinuses/Orbits: Negative orbits. Trace paranasal sinus mucosal thickening. Other: Mastoids are clear. Visible internal auditory structures appear normal. Scalp and face soft tissues appear negative. MRI CERVICAL SPINE FINDINGS Alignment: Mild straightening of cervical lordosis. No spondylolisthesis. Vertebrae: Abnormal marrow signal from the C3 vertebra into the visible upper thoracic spine, although little abnormal enhancement and marrow edema in the cervical vertebrae. A 12 millimeter T1 and T2 heterogeneous lesion in the anterior C3 body most resembles a benign hemangioma. There  is a small enhancing metastasis in the superior right T1 body. There is subtotal T3 vertebral body replacement by enhancing tumor. Cord: Spinal cord signal is within normal limits at all visualized levels. Posterior Fossa, vertebral arteries, paraspinal tissues: Preserved major vascular flow voids in the neck. No cervical lymphadenopathy identified. Disc levels: No cervical spine epidural or extraosseous tumor identified. However, there does appear to be early ventral epidural space extension of tumor at the left T3 body. There is superimposed cervical spine degeneration which results in spinal stenosis with mild spinal cord mass effect at C4-C5, C5-C6, and C6-C7. No associated cord signal abnormality. IMPRESSION: 1. 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 Approximately 22 individual brain metastases ranging from punctate to 26 mm diameter, including an unusual trans-falcine lesion with some associated microhemorrhage and dural involvement. 2. Confluent cerebral vasogenic edema with no midline shift or loss of basilar cisterns. 3. Abnormal marrow signal throughout much of the cervical spine suspicious for widespread bone metastases although enhancing metastases are only confirmed in the visible upper thoracic spine (including with early ventral epidural spread at T3). 4. Superimposed cervical spine degeneration with degenerative spinal stenosis at C4-C5 through C6-C7 with degenerative spinal cord mass effect but no associated cord signal abnormality. Electronically Signed   By: Genevie Ann M.D.   On: 04/28/2019 03:06   CT ABDOMEN PELVIS W CONTRAST  Result Date: 04/28/2019 CLINICAL DATA:  Brain metastasis with unknown primary. EXAM: CT CHEST, ABDOMEN, AND PELVIS WITH CONTRAST TECHNIQUE: Multidetector CT imaging of the chest, abdomen and pelvis was performed following the standard protocol during bolus administration of intravenous contrast. CONTRAST:  100 cc Omnipaque 300 IV COMPARISON:  No prior chest or  abdominal imaging. FINDINGS: CT CHEST FINDINGS Cardiovascular: Thoracic aorta is normal in caliber. Trace atherosclerosis of the aortic arch. Small amount pericardial fluid in the superior pericardial recess. Small coronary artery calcifications. Heart is normal in size. Mediastinum/Nodes: Probable 12 mm right hilar node, contiguous with right upper lobe perihilar mass. No enlarged mediastinal lymph nodes. No left hilar adenopathy. No supraclavicular adenopathy. Visualized thyroid gland is normal. Decompressed esophagus. Lungs/Pleura: Perihilar right upper lobe 5.3 x 4.6 x 3.7 cm pulmonary mass has irregular margins and surrounding ground-glass opacity. This causes mass effect and narrowing of the right upper lobe bronchus. Mass abuts the minor fissure with fissural thickening extending laterally. Mass abuts the major fissure with localized mass effect slight nodular protrusion a possible extension into the lower lobe, for example series 7, image 48. Biapical emphysema with bulla at the left lung apex. No additional pulmonary mass or nodule. Subpleural atelectasis in the dependent left lower lobe. Musculoskeletal: Bone marrow diffusely heterogeneous in density. Lytic lesion with posterior left T3 vertebral body with posterior cortex involvement and extension to the spinal canal. Lytic lesion involving posterior right T1 vertebral body extending into the pedicle. T8 vertebral body lesion may represent a  hemangioma but is nonspecific. Lytic lesion within T10 suspicious for metastatic disease. CT ABDOMEN PELVIS FINDINGS Hepatobiliary: Vague 8 mm low-density lesion in the left lobe of the liver, series 3, image 57. Focal fatty infiltration adjacent to the falciform ligament. Gallbladder is unremarkable. No biliary dilatation. Pancreas: Unremarkable. No pancreatic ductal dilatation or surrounding inflammatory changes. No evidence of pancreatic mass. Spleen: Normal in size without focal abnormality. Adrenals/Urinary  Tract: No adrenal nodule. Left greater than right pelvicaliectasis and prominence of the ureters, likely related to bladder distension. No ureteral stones or cause of obstruction. Urinary bladder is distended without wall thickening. Homogeneous renal enhancement with symmetric excretion on delayed phase imaging. No evidence of focal renal mass. Stomach/Bowel: Stomach is within normal limits. Appendix appears normal. No evidence of bowel wall thickening, distention, or inflammatory changes. No evidence of colonic or small bowel mass. Vascular/Lymphatic: Aorto bi-iliac atherosclerosis. No aneurysm. Patent portal vein. No retroperitoneal adenopathy. No mesenteric or upper abdominal adenopathy. 7 mm right external iliac node is nonspecific. No enlarged inguinal nodes. Reproductive: Heterogeneous enlarged prostate gland spanning 5 cm. This causes mild mass effect on the bladder base. Other: No ascites, free air, or omental nodularity. Small fat containing umbilical hernia. Musculoskeletal: Bone marrow diffusely heterogeneous. Lytic lesion within L4 vertebral body with mild pathologic compression fracture. Lesion extends into the right pedicle. Lytic lesion within anterior L5 vertebral body. Lytic lesion within the right superior pubic ramus extending to the puboacetabular junction. Suspected small central lytic lesion in the sacrum. Suspected small lytic lesion in the left anterior iliac bone and left iliac crest. IMPRESSION: 1. Right upper lobe perihilar mass measuring 5.3 x 4.6 x 3.7 cm consistent with primary bronchogenic malignancy. Mass abuts the major and minor minor fissure with possible extension into the lower lobe. 2. Probable 12 mm right hilar node, contiguous with the right upper lobe perihilar mass. 3. Osseous metastatic disease with multiple lytic lesions in the thoracic and lumbar spine, pelvis and sacrum. 4. Vague 8 mm low-density lesion in the left lobe of the liver, nonspecific in the setting. 5.  Enlarged prostate gland causing mild mass effect on the bladder base. 6. Urinary bladder and bilateral renal collecting system distention may be due to mass effect from enlarged prostate gland. Aortic Atherosclerosis (ICD10-I70.0) and Emphysema (ICD10-J43.9). Electronically Signed   By: Keith Rake M.D.   On: 04/28/2019 03:42   NM PET Image Initial (PI) Skull Base To Thigh  Result Date: 05/17/2019 CLINICAL DATA:  Initial treatment strategy for non-small-cell lung cancer. EXAM: NUCLEAR MEDICINE PET SKULL BASE TO THIGH TECHNIQUE: 7.8 mCi F-18 FDG was injected intravenously. Full-ring PET imaging was performed from the skull base to thigh after the radiotracer. CT data was obtained and used for attenuation correction and anatomic localization. Fasting blood glucose: 111 mg/dl COMPARISON:  Brain MR of 04/27/2019. Chest abdomen and pelvic CTs of 04/28/2019. FINDINGS: Mediastinal blood pool activity: SUV max 1.8 Liver activity: SUV max Not applicable. NECK: No areas of abnormal hypermetabolism. Incidental CT findings: Right carotid atherosclerosis. No cervical adenopathy. CHEST: Posterior right upper lobe hypermetabolic lung mass measures 4.2 cm and a S.U.V. max of 14.3 on 31/8. Right hilar hypermetabolic node measures 12 mm on prior diagnostic CT and a S.U.V. max of 6.0. Incidental CT findings: Trace bilateral pleural fluid or thickening. Aortic and coronary artery atherosclerosis. Centrilobular emphysema. Bullous disease within the apices. ABDOMEN/PELVIS: No abdominopelvic nodal hypermetabolism. Apparent supracystic hypermetabolism is without CT correlate and favored to represent misregistration of bladder activity. Incidental CT findings:  Deferred to recent diagnostic CT. Normal adrenal glands. Abdominal aortic atherosclerosis. Colonic stool burden suggests constipation. Mild prostatomegaly. SKELETON: Extensive hypermetabolic osseous metastasis. Right superior pubic ramus and anterior acetabular lesion  measures 2.9 cm and a S.U.V. max of 8.9 on 183/4. Left sacral index lesion measures a S.U.V. max of 14.3. Hypermetabolic lytic lesion with mild superior endplate irregularity within the L4 vertebral body. Incidental CT findings: none IMPRESSION: 1. Right upper lobe primary bronchogenic carcinoma with right hilar nodal and widespread osseous metastasis. 2. Lytic lesion within the right superior pubic ramus and anterior acetabulum could predispose the patient to pathologic fracture. 3. Aortic atherosclerosis (ICD10-I70.0), coronary artery atherosclerosis and emphysema (ICD10-J43.9). Electronically Signed   By: Abigail Miyamoto M.D.   On: 05/17/2019 16:06   CT HEAD CODE STROKE WO CONTRAST  Result Date: 04/27/2019 CLINICAL DATA:  Code stroke.  Right-sided weakness. EXAM: CT HEAD WITHOUT CONTRAST TECHNIQUE: Contiguous axial images were obtained from the base of the skull through the vertex without intravenous contrast. COMPARISON:  None. FINDINGS: Brain: Rounded low-density masses posteriorly in both frontal lobes near the vertex each measure 1.7 cm, and there is a 2.5 cm low-density mass more anteriorly in the left frontal lobe. There is extensive vasogenic edema in the left greater than right frontal lobes with regional sulcal effacement and slight mass effect on the lateral ventricles without midline shift. A small area of edema is noted in the right occipital lobe without a presumed underlying lesion visible on this unenhanced study. No acute cortically based infarct, intracranial hemorrhage, or extra-axial fluid collection is identified. Vascular: No hyperdense vessel. Skull: No fracture or focal osseous lesion. Sinuses/Orbits: Visualized paranasal sinuses and mastoid air cells are clear. Orbits are unremarkable. Other: None. ASPECTS [facility] Stroke Program Early CT Score) Not scored due to the presence of multiple cerebral masses. IMPRESSION: 1. Multiple brain masses with extensive edema in both frontal lobes most  concerning for metastases. Brain MRI without and with contrast is recommended for further evaluation. 2. No midline shift or intracranial hemorrhage. These results were called by telephone at the time of interpretation on 04/27/2019 at 6:45 pm to Dr. Ezequiel Essex, who verbally acknowledged these results. Electronically Signed   By: Logan Bores M.D.   On: 04/27/2019 18:47    ASSESSMENT & PLAN:  Malignant neoplasm of upper lobe of right lung ([place]) 1.  Metastatic lung cancer: -Presentation with right arm weakness with brain MRI on 04/28/2019 showing widespread metastatic disease to the brain, approximately 22 individual brain metastasis ranging from punctate to 26 mm diameter with vasogenic edema, no midline shift. -Patient underwent bronchoscopy and biopsy x2, unfortunately negative on both occasions. -PET scan on 05/17/2019 shows right upper lobe hypermetabolic mass 4.2 cm, SUV 14.3.  Right hilar lymph node SUV 6, 12 mm.  Extensive hypermetabolic osseous meta stasis.  Right superior pubic ramus and anterior acetabulum lesion measures 2.9 cm with SUV of 8.9.  Left sacral index lesion measures SUV 14.3.  Hypermetabolic lytic lesion with mild superior endplate irregularity at L4 vertebral body. -He is currently scheduled for biopsy of the bone lesion by IR on Friday. -I have contacted Dr. Vernard Gambles for possible lung biopsy.  He felt that patient is at high risk for pneumothorax. -He did tell me that they will try to get soft tissue component from the bone lesion which could be successfully used for molecular testing. -Today his strength of the right upper extremity has improved to normal.  However he has difficulty with flexion of the  right hip joint.  2.  Brain metastasis: -He is currently receiving radiation which will be completed on 05/19/2019. -He is taking dexamethasone 4 mg twice daily. -He is also on Keppra 500 mg twice daily.  3.  Back pain: -He reported back pain for 3 to 4 weeks duration.   Right now he is taking Tylenol which is helping.  Orders Placed This Encounter  Procedures  . CT Biopsy    Standing Status:   Future    Standing Expiration Date:   05/17/2020    Order Specific Question:   Lab orders requested (DO NOT place separate lab orders, these will be automatically ordered during procedure specimen collection):    Answer:   Surgical Pathology    Order Specific Question:   Reason for Exam (SYMPTOM  OR DIAGNOSIS REQUIRED)    Answer:   right lung mass    Order Specific Question:   Preferred location?    Answer:   [facility]    Order Specific Question:   Radiology Contrast Protocol - do NOT remove file path    Answer:   [email or web address]    All questions were answered. The patient knows to call the clinic with any problems, questions or concerns.      Derek Jack, MD 05/19/2019 7:54 AM

## 2019-05-19 NOTE — Progress Notes (Signed)
  Radiation Oncology         [phone] ________________________________  Name: Brandon Ferguson         MRN: [id number] DOB: 1960-09-10  Steroid Taper Instructions following Whole Brain Radiation:  You have a prescription for Dexamethasone (Decadron) 4 mg tablets.   On 05/20/2019:  Take 1/2 of a tablet which is 2 mg by mouth twice a day  On 05/27/2019:  Start taking 1/2 of a tablet which is still 2 mg by mouth once a day  On 06/03/2019: Start taking 1/2 tablet (still 2 mg) by mouth every other day and on 06/15/2019 stop taking this medication.   If during these weeks you experience worsening symptoms of weakness, headache, changes in speech or vision please call our office which could change these instructions.

## 2019-05-19 NOTE — Assessment & Plan Note (Addendum)
1.  Metastatic lung cancer: -Presentation with right arm weakness with brain MRI on 04/28/2019 showing widespread metastatic disease to the brain, approximately 22 individual brain metastasis ranging from punctate to 26 mm diameter with vasogenic edema, no midline shift. -Patient underwent bronchoscopy and biopsy x2, unfortunately negative on both occasions. -He had a 45-pack-year smoking history, quit during hospitalization. -PET scan on 05/17/2019 shows right upper lobe hypermetabolic mass 4.2 cm, SUV 14.3.  Right hilar lymph node SUV 6, 12 mm.  Extensive hypermetabolic osseous meta stasis.  Right superior pubic ramus and anterior acetabulum lesion measures 2.9 cm with SUV of 8.9.  Left sacral index lesion measures SUV 14.3.  Hypermetabolic lytic lesion with mild superior endplate irregularity at L4 vertebral body. -He is currently scheduled for biopsy of the bone lesion by IR on Friday. -I have contacted Dr. Vernard Gambles for possible lung biopsy.  He felt that patient is at high risk for pneumothorax. -He did tell me that they will try to get soft tissue component from the bone lesion which could be successfully used for molecular testing. -Today his strength of the right upper extremity has improved to normal.  However he has difficulty with flexion of the right hip joint. -Systemic therapy will be dependent on histology on the biopsy and results of PD-L1 and molecular testing with [facility].  2.  Brain metastasis: -He is currently receiving radiation which will be completed on 05/19/2019. -He is taking dexamethasone 4 mg twice daily. -He is also on Keppra 500 mg twice daily.  3.  Back pain: -He reported back pain for 3 to 4 weeks duration.  Right now he is taking Tylenol which is helping.

## 2019-05-20 ENCOUNTER — Ambulatory Visit (HOSPITAL_COMMUNITY)
Admission: RE | Admit: 2019-05-20 | Discharge: 2019-05-20 | Disposition: A | Discharge status: Home / Self Care | Payer: Medicaid Other | Source: Ambulatory Visit

## 2019-05-20 ENCOUNTER — Ambulatory Visit: Payer: Medicaid Other

## 2019-05-20 ENCOUNTER — Other Ambulatory Visit: Payer: Self-pay

## 2019-05-20 ENCOUNTER — Encounter (HOSPITAL_COMMUNITY): Payer: Self-pay

## 2019-05-20 ENCOUNTER — Ambulatory Visit (HOSPITAL_COMMUNITY)
Admission: RE | Admit: 2019-05-20 | Discharge: 2019-05-20 | Disposition: A | Discharge status: Home / Self Care | Payer: Medicaid Other | Source: Ambulatory Visit | Attending: Oncology | Admitting: Oncology

## 2019-05-20 DIAGNOSIS — C7951 Secondary malignant neoplasm of bone: Secondary | ICD-10-CM | POA: Insufficient documentation

## 2019-05-20 DIAGNOSIS — I700 Atherosclerosis of aorta: Secondary | ICD-10-CM | POA: Insufficient documentation

## 2019-05-20 DIAGNOSIS — C7931 Secondary malignant neoplasm of brain: Secondary | ICD-10-CM | POA: Diagnosis not present

## 2019-05-20 DIAGNOSIS — J432 Centrilobular emphysema: Secondary | ICD-10-CM | POA: Insufficient documentation

## 2019-05-20 DIAGNOSIS — E559 Vitamin D deficiency, unspecified: Secondary | ICD-10-CM | POA: Insufficient documentation

## 2019-05-20 DIAGNOSIS — Z79899 Other long term (current) drug therapy: Secondary | ICD-10-CM | POA: Diagnosis not present

## 2019-05-20 DIAGNOSIS — I2510 Atherosclerotic heart disease of native coronary artery without angina pectoris: Secondary | ICD-10-CM | POA: Insufficient documentation

## 2019-05-20 DIAGNOSIS — C3490 Malignant neoplasm of unspecified part of unspecified bronchus or lung: Secondary | ICD-10-CM | POA: Insufficient documentation

## 2019-05-20 DIAGNOSIS — F17210 Nicotine dependence, cigarettes, uncomplicated: Secondary | ICD-10-CM | POA: Diagnosis not present

## 2019-05-20 LAB — CBC
HCT: 33.7 % — ABNORMAL LOW (ref 39.0–52.0)
Hemoglobin: 10.7 g/dL — ABNORMAL LOW (ref 13.0–17.0)
MCH: 29.9 pg (ref 26.0–34.0)
MCHC: 31.8 g/dL (ref 30.0–36.0)
MCV: 94.1 fL (ref 80.0–100.0)
Platelets: 242 10*3/uL (ref 150–400)
RBC: 3.58 MIL/uL — ABNORMAL LOW (ref 4.22–5.81)
RDW: 15.7 % — ABNORMAL HIGH (ref 11.5–15.5)
WBC: 14.1 10*3/uL — ABNORMAL HIGH (ref 4.0–10.5)
nRBC: 0 % (ref 0.0–0.2)

## 2019-05-20 LAB — PROTIME-INR
INR: 1 (ref 0.8–1.2)
Prothrombin Time: 13 seconds (ref 11.4–15.2)

## 2019-05-20 MED ORDER — FENTANYL CITRATE (PF) 100 MCG/2ML IJ SOLN
INTRAMUSCULAR | Status: AC | PRN
  Administered 2019-05-20 (×2): 50 ug via INTRAVENOUS

## 2019-05-20 MED ORDER — FLUMAZENIL 0.5 MG/5ML IV SOLN
INTRAVENOUS | Status: AC
  Filled 2019-05-20: qty 5

## 2019-05-20 MED ORDER — NALOXONE HCL 0.4 MG/ML IJ SOLN
INTRAMUSCULAR | Status: AC
  Filled 2019-05-20: qty 1

## 2019-05-20 MED ORDER — SODIUM CHLORIDE 0.9 % IV SOLN
INTRAVENOUS | Status: DC
  Administered 2019-05-20: 10:00:00 via INTRAVENOUS

## 2019-05-20 MED ORDER — MIDAZOLAM HCL 2 MG/2ML IJ SOLN
INTRAMUSCULAR | Status: AC | PRN
  Administered 2019-05-20 (×2): 1 mg via INTRAVENOUS

## 2019-05-20 MED ORDER — MIDAZOLAM HCL 2 MG/2ML IJ SOLN
INTRAMUSCULAR | Status: AC
  Filled 2019-05-20: qty 4

## 2019-05-20 MED ORDER — LIDOCAINE HCL 1 % IJ SOLN
INTRAMUSCULAR | Status: AC | PRN
  Administered 2019-05-20: 10 mL

## 2019-05-20 MED ORDER — FENTANYL CITRATE (PF) 100 MCG/2ML IJ SOLN
INTRAMUSCULAR | Status: AC
  Filled 2019-05-20: qty 4

## 2019-05-20 NOTE — Discharge Instructions (Signed)
Moderate Conscious Sedation, Adult, Care After These instructions provide you with information about caring for yourself after your procedure. Your health care provider may also give you more specific instructions. Your treatment has been planned according to current medical practices, but problems sometimes occur. Call your health care provider if you have any problems or questions after your procedure. What can I expect after the procedure? After your procedure, it is common:  To feel sleepy for several hours.  To feel clumsy and have poor balance for several hours.  To have poor judgment for several hours.  To vomit if you eat too soon. Follow these instructions at home: For at least 24 hours after the procedure:   Do not: ? Participate in activities where you could fall or become injured. ? Drive. ? Use heavy machinery. ? Drink alcohol. ? Take sleeping pills or medicines that cause drowsiness. ? Make important decisions or sign legal documents. ? Take care of children on your own.  Rest. Eating and drinking  Follow the diet recommended by your health care provider.  If you vomit: ? Drink water, juice, or soup when you can drink without vomiting. ? Make sure you have little or no nausea before eating solid foods. General instructions  Have a responsible adult stay with you until you are awake and alert.  Take over-the-counter and prescription medicines only as told by your health care provider.  If you smoke, do not smoke without supervision.  Keep all follow-up visits as told by your health care provider. This is important. Contact a health care provider if:  You keep feeling nauseous or you keep vomiting.  You feel light-headed.  You develop a rash.  You have a fever. Get help right away if:  You have trouble breathing. This information is not intended to replace advice given to you by your health care provider. Make sure you discuss any questions you have  with your health care provider. Document Released: 03/16/2013 Document Revised: 05/08/2017 Document Reviewed: 09/15/2015 Elsevier Patient Education  2020 [place]. Needle Biopsy of the Bone, Care After This sheet gives you information about how to care for yourself after your procedure. Your health care provider may also give you more specific instructions. If you have problems or questions, contact your health care provider. What can I expect after the procedure? After the procedure, it is common to have soreness or tenderness at the puncture site. Follow these instructions at home: Puncture care   Follow instructions from your health care provider about how to take care of your puncture site. Make sure you: ? Wash your hands with soap and water before and after you change your bandage (dressing). If soap and water are not available, use hand sanitizer. ? Change your dressing as told by your health care provider.  Check your puncture site every day for signs of infection. Check for: ? Redness, swelling, or worsening pain. ? Fluid or blood. ? Warmth. ? Pus or a bad smell. General instructions  Take over-the-counter and prescription medicines only as told by your health care provider.  Do not drive for 24 hours if you were given a sedative during your procedure.  Return to your normal activities as told by your health care provider.  Keep all follow-up visits as told by your health care provider. This is important. Contact a health care provider if:  You have redness, swelling, or worsening pain at the site of your puncture.  You have fluid or blood coming from  your puncture site.  Your puncture site feels warm to the touch.  You have pus or a bad smell coming from your puncture site.  You have a fever.  You have persistent nausea or vomiting. Get help right away if:  You develop a rash.  You have difficulty breathing. Summary  After the procedure, it is common to  have soreness or tenderness at the puncture site.  Follow instructions from your health care provider about how to take care of your puncture site.  Contact a health care provider if you have any signs of infection.  Keep all follow-up visits as told by your health care provider. This is important. This information is not intended to replace advice given to you by your health care provider. Make sure you discuss any questions you have with your health care provider. Document Released: 12/13/2004 Document Revised: 02/04/2018 Document Reviewed: 02/04/2018 Elsevier Patient Education  2020 [employer].

## 2019-05-20 NOTE — Consult Note (Signed)
Chief Complaint: Patient was seen in consultation today for CT-guided right posterior iliac bone lesion biopsy  Referring Physician(s): Curcio,Kristin R  Supervising Physician: Arne Cleveland  Patient Status: [facility] - Out-pt  History of Present Illness: Brandon Ferguson is a 58 y.o. male, ex-smoker, with newly diagnosed what appears to be metastatic lung cancer with brain mets, prior radiation therapy and bronchoscopy/biopsy which yielded atypical cells.  Recent PET scan has revealed right upper lobe primary bronchogenic carcinoma with right hilar nodal and widespread osseous metastasis, lytic lesion in the right superior pubic ramus and anterior acetabulum.  He presents today for CT-guided right posterior iliac bone lesion biopsy for further evaluation/pathological confirmation.  Past Medical History:  Diagnosis Date  . Iron deficiency   . Vitamin D deficiency     Past Surgical History:  Procedure Laterality Date  . KNEE SURGERY    . VIDEO BRONCHOSCOPY Bilateral 04/29/2019   Procedure: VIDEO BRONCHOSCOPY WITH FLUORO;  Surgeon: Laurin Coder, MD;  Location: [facility] ENDOSCOPY;  Service: Endoscopy;  Laterality: Bilateral;  . VIDEO BRONCHOSCOPY Bilateral 05/03/2019   Procedure: VIDEO BRONCHOSCOPY WITHOUT FLUORO;  Surgeon: Rigoberto Noel, MD;  Location: [facility];  Service: Cardiopulmonary;  Laterality: Bilateral;    Allergies: Patient has no known allergies.  Medications: Prior to Admission medications   Medication Sig Start Date End Date Taking? Authorizing Provider  dexamethasone (DECADRON) 4 MG tablet Take 1 tablet (4 mg total) by mouth 2 (two) times daily. 05/04/19  Yes Shelly Coss, MD  ferrous sulfate 325 (65 FE) MG EC tablet Take 325 mg by mouth daily.   Yes [provider]  folic acid (FOLVITE) 1 MG tablet Take 1 tablet (1 mg total) by mouth daily. 05/05/19  Yes Shelly Coss, MD  levETIRAcetam (KEPPRA) 500 MG tablet Take 1 tablet (500 mg total) by mouth  2 (two) times daily. 05/04/19  Yes Shelly Coss, MD  pantoprazole (PROTONIX) 40 MG tablet Take 1 tablet (40 mg total) by mouth daily. 05/05/19  Yes Shelly Coss, MD  thiamine 100 MG tablet Take 1 tablet (100 mg total) by mouth daily. 05/05/19  Yes Shelly Coss, MD  acetaminophen (TYLENOL) 500 MG tablet Take 500 mg by mouth every 6 (six) hours as needed for headache.    [provider]  naproxen sodium (ALEVE) 220 MG tablet Take 220 mg by mouth 2 (two) times daily as needed (for headache).    [provider]  polyethylene glycol (MIRALAX / GLYCOLAX) 17 g packet Take 17 g by mouth daily as needed. Patient not taking: Reported on 05/18/2019 05/04/19   Shelly Coss, MD     Family History  Problem Relation Age of Onset  . Breast cancer Mother   . Cirrhosis Father   . Alcohol abuse Father   . Hypertension Sister   . Cancer Brother   . Hypertension Sister   . Hypertension Sister   . Hypertension Sister   . Hypertension Sister     Social History   Socioeconomic History  . Marital status: Single    Spouse name: Not on file  . Number of children: Not on file  . Years of education: Not on file  . Highest education level: Not on file  Occupational History  . Not on file  Tobacco Use  . Smoking status: Current Every Day Smoker    Packs/day: 1.00    Types: Cigarettes  . Smokeless tobacco: Never Used  Substance and Sexual Activity  . Alcohol use: Yes    Alcohol/week:  6.0 standard drinks    Types: 6 Cans of beer per week    Comment: pt drinks 3 large beers daily  . Drug use: Yes    Types: Marijuana    Comment: daily  . Sexual activity: Not on file  Other Topics Concern  . Not on file  Social History Narrative  . Not on file   Social Determinants of Health   Financial Resource Strain: Low Risk   . Difficulty of Paying Living Expenses: Not very hard  Food Insecurity: No Food Insecurity  . Worried About Charity fundraiser in the Last Year: Never true   . Ran Out of Food in the Last Year: Never true  Transportation Needs: No Transportation Needs  . Lack of Transportation (Medical): No  . Lack of Transportation (Non-Medical): No  Physical Activity: Inactive  . Days of Exercise per Week: 0 days  . Minutes of Exercise per Session: 0 min  Stress: No Stress Concern Present  . Feeling of Stress : Only a little  Social Connections: Somewhat Isolated  . Frequency of Communication with Friends and Family: More than three times a week  . Frequency of Social Gatherings with Friends and Family: More than three times a week  . Attends Religious Services: 1 to 4 times per year  . Active Member of Clubs or Organizations: No  . Attends Archivist Meetings: Never  . Marital Status: Never married      Review of Systems he currently denies fever, headache, chest pain, dyspnea, cough, abdominal pain, nausea, vomiting or bleeding.  He does have some intermittent back pain  Vital Signs: Blood pressure 140/84, temp 98.1, heart rate 62, respirations 18, O2 sat 99% room air   Physical Exam awake, alert.  Chest clear to auscultation bilaterally.  Heart with regular rate and rhythm.  Abdomen soft, positive bowel sounds, nontender.  No lower extremity edema.  Imaging: CT CHEST W CONTRAST  Result Date: 04/28/2019 CLINICAL DATA:  Brain metastasis with unknown primary. EXAM: CT CHEST, ABDOMEN, AND PELVIS WITH CONTRAST TECHNIQUE: Multidetector CT imaging of the chest, abdomen and pelvis was performed following the standard protocol during bolus administration of intravenous contrast. CONTRAST:  100 cc Omnipaque 300 IV COMPARISON:  No prior chest or abdominal imaging. FINDINGS: CT CHEST FINDINGS Cardiovascular: Thoracic aorta is normal in caliber. Trace atherosclerosis of the aortic arch. Small amount pericardial fluid in the superior pericardial recess. Small coronary artery calcifications. Heart is normal in size. Mediastinum/Nodes: Probable 12 mm right  hilar node, contiguous with right upper lobe perihilar mass. No enlarged mediastinal lymph nodes. No left hilar adenopathy. No supraclavicular adenopathy. Visualized thyroid gland is normal. Decompressed esophagus. Lungs/Pleura: Perihilar right upper lobe 5.3 x 4.6 x 3.7 cm pulmonary mass has irregular margins and surrounding ground-glass opacity. This causes mass effect and narrowing of the right upper lobe bronchus. Mass abuts the minor fissure with fissural thickening extending laterally. Mass abuts the major fissure with localized mass effect slight nodular protrusion a possible extension into the lower lobe, for example series 7, image 48. Biapical emphysema with bulla at the left lung apex. No additional pulmonary mass or nodule. Subpleural atelectasis in the dependent left lower lobe. Musculoskeletal: Bone marrow diffusely heterogeneous in density. Lytic lesion with posterior left T3 vertebral body with posterior cortex involvement and extension to the spinal canal. Lytic lesion involving posterior right T1 vertebral body extending into the pedicle. T8 vertebral body lesion may represent a hemangioma but is nonspecific. Lytic lesion within  T10 suspicious for metastatic disease. CT ABDOMEN PELVIS FINDINGS Hepatobiliary: Vague 8 mm low-density lesion in the left lobe of the liver, series 3, image 57. Focal fatty infiltration adjacent to the falciform ligament. Gallbladder is unremarkable. No biliary dilatation. Pancreas: Unremarkable. No pancreatic ductal dilatation or surrounding inflammatory changes. No evidence of pancreatic mass. Spleen: Normal in size without focal abnormality. Adrenals/Urinary Tract: No adrenal nodule. Left greater than right pelvicaliectasis and prominence of the ureters, likely related to bladder distension. No ureteral stones or cause of obstruction. Urinary bladder is distended without wall thickening. Homogeneous renal enhancement with symmetric excretion on delayed phase imaging. No  evidence of focal renal mass. Stomach/Bowel: Stomach is within normal limits. Appendix appears normal. No evidence of bowel wall thickening, distention, or inflammatory changes. No evidence of colonic or small bowel mass. Vascular/Lymphatic: Aorto bi-iliac atherosclerosis. No aneurysm. Patent portal vein. No retroperitoneal adenopathy. No mesenteric or upper abdominal adenopathy. 7 mm right external iliac node is nonspecific. No enlarged inguinal nodes. Reproductive: Heterogeneous enlarged prostate gland spanning 5 cm. This causes mild mass effect on the bladder base. Other: No ascites, free air, or omental nodularity. Small fat containing umbilical hernia. Musculoskeletal: Bone marrow diffusely heterogeneous. Lytic lesion within L4 vertebral body with mild pathologic compression fracture. Lesion extends into the right pedicle. Lytic lesion within anterior L5 vertebral body. Lytic lesion within the right superior pubic ramus extending to the puboacetabular junction. Suspected small central lytic lesion in the sacrum. Suspected small lytic lesion in the left anterior iliac bone and left iliac crest. IMPRESSION: 1. Right upper lobe perihilar mass measuring 5.3 x 4.6 x 3.7 cm consistent with primary bronchogenic malignancy. Mass abuts the major and minor minor fissure with possible extension into the lower lobe. 2. Probable 12 mm right hilar node, contiguous with the right upper lobe perihilar mass. 3. Osseous metastatic disease with multiple lytic lesions in the thoracic and lumbar spine, pelvis and sacrum. 4. Vague 8 mm low-density lesion in the left lobe of the liver, nonspecific in the setting. 5. Enlarged prostate gland causing mild mass effect on the bladder base. 6. Urinary bladder and bilateral renal collecting system distention may be due to mass effect from enlarged prostate gland. Aortic Atherosclerosis (ICD10-I70.0) and Emphysema (ICD10-J43.9). Electronically Signed   By: Keith Rake M.D.   On:  04/28/2019 03:42   MR BRAIN W WO CONTRAST  Result Date: 04/28/2019 CLINICAL DATA:  58 year old male with code stroke presentation, plain head CT suggesting metastatic disease to the brain. EXAM: MRI HEAD WITHOUT AND WITH CONTRAST MRI CERVICAL SPINE WITHOUT AND WITH CONTRAST TECHNIQUE: Multiplanar, multiecho pulse sequences of the brain and surrounding structures, and cervical spine, to include the craniocervical junction and cervicothoracic junction, were obtained without and with intravenous contrast. CONTRAST:  7.61mL GADAVIST GADOBUTROL 1 MMOL/ML IV SOLN COMPARISON:  Head CT 04/27/2019. FINDINGS: MRI HEAD FINDINGS Brain: Multiple enhancing brain metastases ranging from punctate to 26 millimeters diameter. Approximately 22 individual metastases are identified, with 1 or 2 additional questionable appearing areas (small left choroid plexus versus subependymal metastasis on series 38, image 28). There is an unusual trans-falcine lobulated and enhancing mass measuring about 23 millimeters (series 39, image 13) with microhemorrhage and an adjacent more cystic 12 centimeter metastasis. Associated mild regional dural thickening. All of the other lesions larger than 1 centimeter are cystic and rim enhancing. No other dural involvement or dural thickening identified. No leptomeningeal tumor identified. Confluent vasogenic edema in both frontal and parietal lobes as seen by CT earlier today.  Regional mass effect with no midline shift or loss of basilar cisterns. Mass effect on both lateral ventricles with no ventriculomegaly or transependymal edema. No superimposed restricted diffusion suggestive of acute infarction. No acute intracranial hemorrhage. Cervicomedullary junction and pituitary are within normal limits. Vascular: Major intracranial vascular flow voids are preserved. The major dural venous sinuses are enhancing and appear to be patent. Skull and upper cervical spine: Cervical spine reported separately.  Calvarium bone marrow signal is within normal limits. Sinuses/Orbits: Negative orbits. Trace paranasal sinus mucosal thickening. Other: Mastoids are clear. Visible internal auditory structures appear normal. Scalp and face soft tissues appear negative. MRI CERVICAL SPINE FINDINGS Alignment: Mild straightening of cervical lordosis. No spondylolisthesis. Vertebrae: Abnormal marrow signal from the C3 vertebra into the visible upper thoracic spine, although little abnormal enhancement and marrow edema in the cervical vertebrae. A 12 millimeter T1 and T2 heterogeneous lesion in the anterior C3 body most resembles a benign hemangioma. There is a small enhancing metastasis in the superior right T1 body. There is subtotal T3 vertebral body replacement by enhancing tumor. Cord: Spinal cord signal is within normal limits at all visualized levels. Posterior Fossa, vertebral arteries, paraspinal tissues: Preserved major vascular flow voids in the neck. No cervical lymphadenopathy identified. Disc levels: No cervical spine epidural or extraosseous tumor identified. However, there does appear to be early ventral epidural space extension of tumor at the left T3 body. There is superimposed cervical spine degeneration which results in spinal stenosis with mild spinal cord mass effect at C4-C5, C5-C6, and C6-C7. No associated cord signal abnormality. IMPRESSION: 1. 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 Approximately 22 individual brain metastases ranging from punctate to 26 mm diameter, including an unusual trans-falcine lesion with some associated microhemorrhage and dural involvement. 2. Confluent cerebral vasogenic edema with no midline shift or loss of basilar cisterns. 3. Abnormal marrow signal throughout much of the cervical spine suspicious for widespread bone metastases although enhancing metastases are only confirmed in the visible upper thoracic spine (including with early ventral epidural spread at T3). 4.  Superimposed cervical spine degeneration with degenerative spinal stenosis at C4-C5 through C6-C7 with degenerative spinal cord mass effect but no associated cord signal abnormality. Electronically Signed   By: Genevie Ann M.D.   On: 04/28/2019 03:06   MR CERVICAL SPINE W WO CONTRAST  Result Date: 04/28/2019 CLINICAL DATA:  58 year old male with code stroke presentation, plain head CT suggesting metastatic disease to the brain. EXAM: MRI HEAD WITHOUT AND WITH CONTRAST MRI CERVICAL SPINE WITHOUT AND WITH CONTRAST TECHNIQUE: Multiplanar, multiecho pulse sequences of the brain and surrounding structures, and cervical spine, to include the craniocervical junction and cervicothoracic junction, were obtained without and with intravenous contrast. CONTRAST:  7.32mL GADAVIST GADOBUTROL 1 MMOL/ML IV SOLN COMPARISON:  Head CT 04/27/2019. FINDINGS: MRI HEAD FINDINGS Brain: Multiple enhancing brain metastases ranging from punctate to 26 millimeters diameter. Approximately 22 individual metastases are identified, with 1 or 2 additional questionable appearing areas (small left choroid plexus versus subependymal metastasis on series 38, image 28). There is an unusual trans-falcine lobulated and enhancing mass measuring about 23 millimeters (series 39, image 13) with microhemorrhage and an adjacent more cystic 12 centimeter metastasis. Associated mild regional dural thickening. All of the other lesions larger than 1 centimeter are cystic and rim enhancing. No other dural involvement or dural thickening identified. No leptomeningeal tumor identified. Confluent vasogenic edema in both frontal and parietal lobes as seen by CT earlier today. Regional mass effect with no midline shift or  loss of basilar cisterns. Mass effect on both lateral ventricles with no ventriculomegaly or transependymal edema. No superimposed restricted diffusion suggestive of acute infarction. No acute intracranial hemorrhage. Cervicomedullary junction and  pituitary are within normal limits. Vascular: Major intracranial vascular flow voids are preserved. The major dural venous sinuses are enhancing and appear to be patent. Skull and upper cervical spine: Cervical spine reported separately. Calvarium bone marrow signal is within normal limits. Sinuses/Orbits: Negative orbits. Trace paranasal sinus mucosal thickening. Other: Mastoids are clear. Visible internal auditory structures appear normal. Scalp and face soft tissues appear negative. MRI CERVICAL SPINE FINDINGS Alignment: Mild straightening of cervical lordosis. No spondylolisthesis. Vertebrae: Abnormal marrow signal from the C3 vertebra into the visible upper thoracic spine, although little abnormal enhancement and marrow edema in the cervical vertebrae. A 12 millimeter T1 and T2 heterogeneous lesion in the anterior C3 body most resembles a benign hemangioma. There is a small enhancing metastasis in the superior right T1 body. There is subtotal T3 vertebral body replacement by enhancing tumor. Cord: Spinal cord signal is within normal limits at all visualized levels. Posterior Fossa, vertebral arteries, paraspinal tissues: Preserved major vascular flow voids in the neck. No cervical lymphadenopathy identified. Disc levels: No cervical spine epidural or extraosseous tumor identified. However, there does appear to be early ventral epidural space extension of tumor at the left T3 body. There is superimposed cervical spine degeneration which results in spinal stenosis with mild spinal cord mass effect at C4-C5, C5-C6, and C6-C7. No associated cord signal abnormality. IMPRESSION: 1. 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 Approximately 22 individual brain metastases ranging from punctate to 26 mm diameter, including an unusual trans-falcine lesion with some associated microhemorrhage and dural involvement. 2. Confluent cerebral vasogenic edema with no midline shift or loss of basilar cisterns. 3. Abnormal marrow  signal throughout much of the cervical spine suspicious for widespread bone metastases although enhancing metastases are only confirmed in the visible upper thoracic spine (including with early ventral epidural spread at T3). 4. Superimposed cervical spine degeneration with degenerative spinal stenosis at C4-C5 through C6-C7 with degenerative spinal cord mass effect but no associated cord signal abnormality. Electronically Signed   By: Genevie Ann M.D.   On: 04/28/2019 03:06   CT ABDOMEN PELVIS W CONTRAST  Result Date: 04/28/2019 CLINICAL DATA:  Brain metastasis with unknown primary. EXAM: CT CHEST, ABDOMEN, AND PELVIS WITH CONTRAST TECHNIQUE: Multidetector CT imaging of the chest, abdomen and pelvis was performed following the standard protocol during bolus administration of intravenous contrast. CONTRAST:  100 cc Omnipaque 300 IV COMPARISON:  No prior chest or abdominal imaging. FINDINGS: CT CHEST FINDINGS Cardiovascular: Thoracic aorta is normal in caliber. Trace atherosclerosis of the aortic arch. Small amount pericardial fluid in the superior pericardial recess. Small coronary artery calcifications. Heart is normal in size. Mediastinum/Nodes: Probable 12 mm right hilar node, contiguous with right upper lobe perihilar mass. No enlarged mediastinal lymph nodes. No left hilar adenopathy. No supraclavicular adenopathy. Visualized thyroid gland is normal. Decompressed esophagus. Lungs/Pleura: Perihilar right upper lobe 5.3 x 4.6 x 3.7 cm pulmonary mass has irregular margins and surrounding ground-glass opacity. This causes mass effect and narrowing of the right upper lobe bronchus. Mass abuts the minor fissure with fissural thickening extending laterally. Mass abuts the major fissure with localized mass effect slight nodular protrusion a possible extension into the lower lobe, for example series 7, image 48. Biapical emphysema with bulla at the left lung apex. No additional pulmonary mass or nodule. Subpleural  atelectasis in  the dependent left lower lobe. Musculoskeletal: Bone marrow diffusely heterogeneous in density. Lytic lesion with posterior left T3 vertebral body with posterior cortex involvement and extension to the spinal canal. Lytic lesion involving posterior right T1 vertebral body extending into the pedicle. T8 vertebral body lesion may represent a hemangioma but is nonspecific. Lytic lesion within T10 suspicious for metastatic disease. CT ABDOMEN PELVIS FINDINGS Hepatobiliary: Vague 8 mm low-density lesion in the left lobe of the liver, series 3, image 57. Focal fatty infiltration adjacent to the falciform ligament. Gallbladder is unremarkable. No biliary dilatation. Pancreas: Unremarkable. No pancreatic ductal dilatation or surrounding inflammatory changes. No evidence of pancreatic mass. Spleen: Normal in size without focal abnormality. Adrenals/Urinary Tract: No adrenal nodule. Left greater than right pelvicaliectasis and prominence of the ureters, likely related to bladder distension. No ureteral stones or cause of obstruction. Urinary bladder is distended without wall thickening. Homogeneous renal enhancement with symmetric excretion on delayed phase imaging. No evidence of focal renal mass. Stomach/Bowel: Stomach is within normal limits. Appendix appears normal. No evidence of bowel wall thickening, distention, or inflammatory changes. No evidence of colonic or small bowel mass. Vascular/Lymphatic: Aorto bi-iliac atherosclerosis. No aneurysm. Patent portal vein. No retroperitoneal adenopathy. No mesenteric or upper abdominal adenopathy. 7 mm right external iliac node is nonspecific. No enlarged inguinal nodes. Reproductive: Heterogeneous enlarged prostate gland spanning 5 cm. This causes mild mass effect on the bladder base. Other: No ascites, free air, or omental nodularity. Small fat containing umbilical hernia. Musculoskeletal: Bone marrow diffusely heterogeneous. Lytic lesion within L4 vertebral  body with mild pathologic compression fracture. Lesion extends into the right pedicle. Lytic lesion within anterior L5 vertebral body. Lytic lesion within the right superior pubic ramus extending to the puboacetabular junction. Suspected small central lytic lesion in the sacrum. Suspected small lytic lesion in the left anterior iliac bone and left iliac crest. IMPRESSION: 1. Right upper lobe perihilar mass measuring 5.3 x 4.6 x 3.7 cm consistent with primary bronchogenic malignancy. Mass abuts the major and minor minor fissure with possible extension into the lower lobe. 2. Probable 12 mm right hilar node, contiguous with the right upper lobe perihilar mass. 3. Osseous metastatic disease with multiple lytic lesions in the thoracic and lumbar spine, pelvis and sacrum. 4. Vague 8 mm low-density lesion in the left lobe of the liver, nonspecific in the setting. 5. Enlarged prostate gland causing mild mass effect on the bladder base. 6. Urinary bladder and bilateral renal collecting system distention may be due to mass effect from enlarged prostate gland. Aortic Atherosclerosis (ICD10-I70.0) and Emphysema (ICD10-J43.9). Electronically Signed   By: Keith Rake M.D.   On: 04/28/2019 03:42   NM PET Image Initial (PI) Skull Base To Thigh  Result Date: 05/17/2019 CLINICAL DATA:  Initial treatment strategy for non-small-cell lung cancer. EXAM: NUCLEAR MEDICINE PET SKULL BASE TO THIGH TECHNIQUE: 7.8 mCi F-18 FDG was injected intravenously. Full-ring PET imaging was performed from the skull base to thigh after the radiotracer. CT data was obtained and used for attenuation correction and anatomic localization. Fasting blood glucose: 111 mg/dl COMPARISON:  Brain MR of 04/27/2019. Chest abdomen and pelvic CTs of 04/28/2019. FINDINGS: Mediastinal blood pool activity: SUV max 1.8 Liver activity: SUV max Not applicable. NECK: No areas of abnormal hypermetabolism. Incidental CT findings: Right carotid atherosclerosis. No  cervical adenopathy. CHEST: Posterior right upper lobe hypermetabolic lung mass measures 4.2 cm and a S.U.V. max of 14.3 on 31/8. Right hilar hypermetabolic node measures 12 mm on prior diagnostic CT and  a S.U.V. max of 6.0. Incidental CT findings: Trace bilateral pleural fluid or thickening. Aortic and coronary artery atherosclerosis. Centrilobular emphysema. Bullous disease within the apices. ABDOMEN/PELVIS: No abdominopelvic nodal hypermetabolism. Apparent supracystic hypermetabolism is without CT correlate and favored to represent misregistration of bladder activity. Incidental CT findings: Deferred to recent diagnostic CT. Normal adrenal glands. Abdominal aortic atherosclerosis. Colonic stool burden suggests constipation. Mild prostatomegaly. SKELETON: Extensive hypermetabolic osseous metastasis. Right superior pubic ramus and anterior acetabular lesion measures 2.9 cm and a S.U.V. max of 8.9 on 183/4. Left sacral index lesion measures a S.U.V. max of 14.3. Hypermetabolic lytic lesion with mild superior endplate irregularity within the L4 vertebral body. Incidental CT findings: none IMPRESSION: 1. Right upper lobe primary bronchogenic carcinoma with right hilar nodal and widespread osseous metastasis. 2. Lytic lesion within the right superior pubic ramus and anterior acetabulum could predispose the patient to pathologic fracture. 3. Aortic atherosclerosis (ICD10-I70.0), coronary artery atherosclerosis and emphysema (ICD10-J43.9). Electronically Signed   By: Abigail Miyamoto M.D.   On: 05/17/2019 16:06   CT HEAD CODE STROKE WO CONTRAST  Result Date: 04/27/2019 CLINICAL DATA:  Code stroke.  Right-sided weakness. EXAM: CT HEAD WITHOUT CONTRAST TECHNIQUE: Contiguous axial images were obtained from the base of the skull through the vertex without intravenous contrast. COMPARISON:  None. FINDINGS: Brain: Rounded low-density masses posteriorly in both frontal lobes near the vertex each measure 1.7 cm, and there is a  2.5 cm low-density mass more anteriorly in the left frontal lobe. There is extensive vasogenic edema in the left greater than right frontal lobes with regional sulcal effacement and slight mass effect on the lateral ventricles without midline shift. A small area of edema is noted in the right occipital lobe without a presumed underlying lesion visible on this unenhanced study. No acute cortically based infarct, intracranial hemorrhage, or extra-axial fluid collection is identified. Vascular: No hyperdense vessel. Skull: No fracture or focal osseous lesion. Sinuses/Orbits: Visualized paranasal sinuses and mastoid air cells are clear. Orbits are unremarkable. Other: None. ASPECTS [facility] Stroke Program Early CT Score) Not scored due to the presence of multiple cerebral masses. IMPRESSION: 1. 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 Brain MRI without and with contrast is recommended for further evaluation. 2. No midline shift or intracranial hemorrhage. These results were called by telephone at the time of interpretation on 04/27/2019 at 6:45 pm to Dr. Ezequiel Essex, who verbally acknowledged these results. Electronically Signed   By: Logan Bores M.D.   On: 04/27/2019 18:47    Labs:  CBC: Recent Labs    05/02/19 0517 05/03/19 0600 05/04/19 0531 05/20/19 1001  WBC 17.8* 15.8* 14.8* 14.1*  HGB 12.6* 12.3* 12.6* 10.7*  HCT 41.0 39.4 39.8 33.7*  PLT 452* 403* 360 242    COAGS: Recent Labs    04/27/19 1835 05/20/19 1001  INR 1.2 1.0  APTT 34  --     BMP: Recent Labs    04/27/19 1835 04/30/19 0912 05/04/19 0531  NA 135 132* 130*  K 3.4* 4.1 4.1  CL 102 97* 98  CO2 24 26 24   GLUCOSE 153* 238* 154*  BUN 14 15 19   CALCIUM 9.4 9.7 9.6  CREATININE 0.77 0.65 0.47*  GFRNONAA >60 >60 >60  GFRAA >60 >60 >60    LIVER FUNCTION TESTS: Recent Labs    04/27/19 1835  BILITOT 0.6  AST 20  ALT 14  ALKPHOS 100  PROT 8.4*  ALBUMIN  3.2*  TUMOR MARKERS: No results for input(s): AFPTM, CEA, CA199, CHROMGRNA in the last 8760 hours.  Assessment and Plan: 58 y.o. male, ex-smoker, with newly diagnosed what appears to be metastatic lung cancer with brain mets, prior radiation therapy and bronchoscopy/biopsy which yielded atypical cells.  Recent PET scan has revealed right upper lobe primary bronchogenic carcinoma with right hilar nodal and widespread osseous metastasis, lytic lesion in the right superior pubic ramus and anterior acetabulum.  He presents today for CT-guided right posterior iliac bone lesion biopsy for further evaluation/pathological confirmation.Risks and benefits of procedure was discussed with the patient  including, but not limited to bleeding, infection, damage to adjacent structures or low yield requiring additional tests.  All of the questions were answered and there is agreement to proceed.  Consent signed and in chart.     Thank you for this interesting consult.  I greatly enjoyed meeting Brandon Ferguson and look forward to participating in their care.  A copy of this report was sent to the requesting provider on this date.  Electronically Signed: D. Rowe Robert, PA-C 05/20/2019, 10:29 AM   I spent a total of  25 minutes   in face to face in clinical consultation, greater than 50% of which was counseling/coordinating care for CT-guided biopsy of right posterior iliac bone lesion

## 2019-05-20 NOTE — Procedures (Signed)
  Procedure: CT core biopsy lytic R iliac lesion   EBL:   minimal Complications:  none immediate  See full dictation in [employer].  Dillard Cannon MD Main # [phone] Pager  [phone]

## 2019-05-20 NOTE — Progress Notes (Signed)
Ride confirmed via phone. Sister Verdene Lennert, updated on possible d/c time.

## 2019-05-24 ENCOUNTER — Telehealth: Payer: Self-pay | Admitting: Radiation Oncology

## 2019-05-24 ENCOUNTER — Encounter (HOSPITAL_COMMUNITY): Payer: Self-pay | Admitting: *Deleted

## 2019-05-24 LAB — SURGICAL PATHOLOGY

## 2019-05-24 NOTE — Telephone Encounter (Signed)
I called and spoke with Brandon Ferguson and his two sisters Brandon Ferguson and Brandon Ferguson. He is currently living with Brandon Ferguson and she will help with his transportation. I reiterated the biopsy results showed NSCLC, adenocarcinoma and that I'd spoken with [name] and the plan will be to proceed with radiotherapy to the bone metastases we had originally planned to treat but deferred due to lack of pathology. He is in agreement. He will come for resimulation tomorrow at 1:30 pm. Given his needs for one of his sisters to be with him due to decreased hearing and reading skills, I will touch base with the front desk to make sure they allow one of his sisters with him.  We also discussed a 3 week course given the plans for ongoing systemic therapy that will soon begin.

## 2019-05-24 NOTE — Progress Notes (Signed)
[name] talked with Dr. Tresa Moore (pathology) via telephone and asked for foundation one and PDL1 be sent on biopsy from 12/11.

## 2019-05-25 ENCOUNTER — Ambulatory Visit: Payer: Medicaid Other | Admitting: Radiation Oncology

## 2019-05-26 ENCOUNTER — Encounter (HOSPITAL_COMMUNITY): Payer: Self-pay | Admitting: Hematology

## 2019-05-26 ENCOUNTER — Other Ambulatory Visit: Payer: Self-pay

## 2019-05-26 ENCOUNTER — Inpatient Hospital Stay (HOSPITAL_BASED_OUTPATIENT_CLINIC_OR_DEPARTMENT_OTHER): Payer: Medicaid Other | Admitting: Hematology

## 2019-05-26 DIAGNOSIS — Z79899 Other long term (current) drug therapy: Secondary | ICD-10-CM | POA: Diagnosis not present

## 2019-05-26 DIAGNOSIS — C3411 Malignant neoplasm of upper lobe, right bronchus or lung: Secondary | ICD-10-CM

## 2019-05-26 DIAGNOSIS — M549 Dorsalgia, unspecified: Secondary | ICD-10-CM | POA: Diagnosis not present

## 2019-05-26 DIAGNOSIS — C7931 Secondary malignant neoplasm of brain: Secondary | ICD-10-CM | POA: Diagnosis not present

## 2019-05-26 DIAGNOSIS — C7951 Secondary malignant neoplasm of bone: Secondary | ICD-10-CM | POA: Diagnosis not present

## 2019-05-26 NOTE — Progress Notes (Signed)
[address], [place] [address]   CLINIC:  Medical Oncology/Hematology  PCP:  Patient, No Pcp Per No address on file None   REASON FOR VISIT:  Follow-up for metastatic adenocarcinoma of the lung.  CURRENT THERAPY: Radiation therapy.  BRIEF ONCOLOGIC HISTORY:  Oncology History  Malignant neoplasm of upper lobe of right lung ([place])  05/02/2019 Initial Diagnosis   Malignant neoplasm of upper lobe of right lung ([place])   05/19/2019 Cancer Staging   Staging form: Lung, AJCC 8th Edition - Clinical stage from 05/19/2019: Stage IVB (cT2b, cN1, pM1c) - Signed by Derek Jack, MD on 05/19/2019      CANCER STAGING: Cancer Staging Malignant neoplasm of upper lobe of right lung [facility]) Staging form: Lung, AJCC 8th Edition - Clinical stage from 05/19/2019: Stage IVB (cT2b, cN1, pM1c) - Signed by Derek Jack, MD on 05/19/2019    INTERVAL HISTORY:  Brandon Ferguson 58 y.o. male seen for metastatic adenocarcinoma of the lung.  He completed brain radiation therapy on 05/19/2019.  He reported pain in the lower back and right mid thigh and right knee, rated as 9 out of 10.  Appetite and energy levels are 75%.  He is accompanied by his sister today.  Denies any headaches, nausea, vomiting or diarrhea.  He is continuing dexamethasone taper.  He is also taking Keppra twice daily.  He underwent biopsy of the right iliac lesion by IR.    REVIEW OF SYSTEMS:  Review of Systems  Musculoskeletal:       Low back pain, right mid thigh and right knee pain.  All other systems reviewed and are negative.    PAST MEDICAL/SURGICAL HISTORY:  Past Medical History:  Diagnosis Date  . Iron deficiency   . Vitamin D deficiency    Past Surgical History:  Procedure Laterality Date  . KNEE SURGERY    . VIDEO BRONCHOSCOPY Bilateral 04/29/2019   Procedure: VIDEO BRONCHOSCOPY WITH FLUORO;  Surgeon: Laurin Coder, MD;  Location: [facility] ENDOSCOPY;  Service:  Endoscopy;  Laterality: Bilateral;  . VIDEO BRONCHOSCOPY Bilateral 05/03/2019   Procedure: VIDEO BRONCHOSCOPY WITHOUT FLUORO;  Surgeon: Rigoberto Noel, MD;  Location: [facility];  Service: Cardiopulmonary;  Laterality: Bilateral;     SOCIAL HISTORY:  Social History   Socioeconomic History  . Marital status: Single    Spouse name: Not on file  . Number of children: Not on file  . Years of education: Not on file  . Highest education level: Not on file  Occupational History  . Not on file  Tobacco Use  . Smoking status: Current Every Day Smoker    Packs/day: 1.00    Types: Cigarettes  . Smokeless tobacco: Never Used  Substance and Sexual Activity  . Alcohol use: Yes    Alcohol/week: 6.0 standard drinks    Types: 6 Cans of beer per week    Comment: pt drinks 3 large beers daily  . Drug use: Yes    Types: Marijuana    Comment: daily  . Sexual activity: Not on file  Other Topics Concern  . Not on file  Social History Narrative  . Not on file   Social Determinants of Health   Financial Resource Strain: Low Risk   . Difficulty of Paying Living Expenses: Not very hard  Food Insecurity: No Food Insecurity  . Worried About Charity fundraiser in the Last Year: Never true  . Ran Out of Food in the Last Year: Never true  Transportation Needs: No Transportation Needs  . Lack of Transportation (Medical): No  . Lack of Transportation (Non-Medical): No  Physical Activity: Inactive  . Days of Exercise per Week: 0 days  . Minutes of Exercise per Session: 0 min  Stress: No Stress Concern Present  . Feeling of Stress : Only a little  Social Connections: Somewhat Isolated  . Frequency of Communication with Friends and Family: More than three times a week  . Frequency of Social Gatherings with Friends and Family: More than three times a week  . Attends Religious Services: 1 to 4 times per year  . Active Member of Clubs or Organizations: No  . Attends Archivist Meetings:  Never  . Marital Status: Never married  Intimate Partner Violence: Not At Risk  . Fear of Current or Ex-Partner: No  . Emotionally Abused: No  . Physically Abused: No  . Sexually Abused: No    FAMILY HISTORY:  Family History  Problem Relation Age of Onset  . Breast cancer Mother   . Cirrhosis Father   . Alcohol abuse Father   . Hypertension Sister   . Cancer Brother   . Hypertension Sister   . Hypertension Sister   . Hypertension Sister   . Hypertension Sister     CURRENT MEDICATIONS:  Outpatient Encounter Medications as of 05/26/2019  Medication Sig  . acetaminophen (TYLENOL) 500 MG tablet Take 500 mg by mouth every 6 (six) hours as needed for headache.  . ferrous sulfate 325 (65 FE) MG EC tablet Take 325 mg by mouth daily.  . naproxen sodium (ALEVE) 220 MG tablet Take 220 mg by mouth 2 (two) times daily as needed (for headache).  . polyethylene glycol (MIRALAX / GLYCOLAX) 17 g packet Take 17 g by mouth daily as needed. (Patient not taking: Reported on 05/18/2019)  . thiamine 100 MG tablet Take 1 tablet (100 mg total) by mouth daily.  . [DISCONTINUED] dexamethasone (DECADRON) 4 MG tablet Take 1 tablet (4 mg total) by mouth 2 (two) times daily.  . [DISCONTINUED] folic acid (FOLVITE) 1 MG tablet Take 1 tablet (1 mg total) by mouth daily.  . [DISCONTINUED] levETIRAcetam (KEPPRA) 500 MG tablet Take 1 tablet (500 mg total) by mouth 2 (two) times daily.  . [DISCONTINUED] pantoprazole (PROTONIX) 40 MG tablet Take 1 tablet (40 mg total) by mouth daily.   No facility-administered encounter medications on file as of 05/26/2019.    ALLERGIES:  No Known Allergies   PHYSICAL EXAM:  ECOG Performance status: 1  Vitals:   05/26/19 1114  BP: (!) 115/59  Pulse: 75  Resp: 16  Temp: 97.9 F (36.6 C)  SpO2: 100%   Filed Weights   05/26/19 1114  Weight: 148 lb 11.2 oz (67.4 kg)    Physical Exam Vitals reviewed.  Constitutional:      Appearance: Normal appearance.    Cardiovascular:     Rate and Rhythm: Normal rate and regular rhythm.     Heart sounds: Normal heart sounds.  Pulmonary:     Effort: Pulmonary effort is normal.     Breath sounds: Normal breath sounds.  Abdominal:     General: There is no distension.     Palpations: Abdomen is soft. There is no mass.  Skin:    General: Skin is warm.  Neurological:     Mental Status: He is alert and oriented to person, place, and time.  Psychiatric:        Mood and Affect: Mood normal.  Behavior: Behavior normal.      LABORATORY DATA:  I have reviewed the labs as listed.  CBC    Component Value Date/Time   WBC 14.1 (H) 05/20/2019 1001   RBC 3.58 (L) 05/20/2019 1001   HGB 10.7 (L) 05/20/2019 1001   HCT 33.7 (L) 05/20/2019 1001   PLT 242 05/20/2019 1001   MCV 94.1 05/20/2019 1001   MCH 29.9 05/20/2019 1001   MCHC 31.8 05/20/2019 1001   RDW 15.7 (H) 05/20/2019 1001   LYMPHSABS 2.1 05/04/2019 0531   MONOABS 0.9 05/04/2019 0531   EOSABS 0.0 05/04/2019 0531   BASOSABS 0.0 05/04/2019 0531   CMP Latest Ref Rng & Units 05/04/2019 04/30/2019 04/27/2019  Glucose 70 - 99 mg/dL 154(H) 238(H) 153(H)  BUN 6 - 20 mg/dL 19 15 14   Creatinine 0.61 - 1.24 mg/dL 0.47(L) 0.65 0.77  Sodium 135 - 145 mmol/L 130(L) 132(L) 135  Potassium 3.5 - 5.1 mmol/L 4.1 4.1 3.4(L)  Chloride 98 - 111 mmol/L 98 97(L) 102  CO2 22 - 32 mmol/L 24 26 24   Calcium 8.9 - 10.3 mg/dL 9.6 9.7 9.4  Total Protein 6.5 - 8.1 g/dL - - 8.4(H)  Total Bilirubin 0.3 - 1.2 mg/dL - - 0.6  Alkaline Phos 38 - 126 U/L - - 100  AST 15 - 41 U/L - - 20  ALT 0 - 44 U/L - - 14       DIAGNOSTIC IMAGING:  I have independently reviewed the scans and discussed with the patient.    ASSESSMENT & PLAN:   Malignant neoplasm of upper lobe of right lung ([place]) 1.  Metastatic adenocarcinoma of the lung: -Presentation with right arm weakness with brain MRI on 04/28/2019 showing widespread metastatic disease to the brain, approximately 22  individual brain metastasis ranging from punctate to 26 mm diameter with vasogenic edema, no midline shift. -Patient underwent bronchoscopy and biopsy x2, unfortunately negative on both occasions. -He had a 45-pack-year smoking history, quit during hospitalization. -PET scan on 05/17/2019 shows right upper lobe hypermetabolic mass 4.2 cm, SUV 14.3.  Right hilar lymph node SUV 6, 12 mm.  Extensive hypermetabolic osseous meta stasis.  Right superior pubic ramus and anterior acetabulum lesion measures 2.9 cm with SUV of 8.9.  Left sacral index lesion measures SUV 14.3.  Hypermetabolic lytic lesion with mild superior endplate irregularity at L4 vertebral body. -Biopsy of the right iliac bone consistent with adenocarcinoma, lung origin. -We discussed the biopsy report in detail.  We have also recommended foundation 1 and PD-L1 testing. -If she does not have any molecular markers, and the PD-L1 less than 50%, he will be considered for carboplatin, pemetrexed and pembrolizumab. -He will proceed with radiation to the metastatic bone lesions at this time. -I will see him back in 3 weeks for follow-up and discuss the results of molecular testing.  2.  Brain metastasis: -XRT to the brain completed on 05/19/2019. -He will continue Keppra 500 mg twice daily.  He will also continue dexamethasone taper.  3.  Back pain: -He reports some back pain and right thigh and knee pain.  He is taking Tylenol as needed.  Total time spent is 25 minutes with more than 50% of the time spent face-to-face discussing biopsy results, further treatment plan, counseling and coordination of care.    Orders placed this encounter:  No orders of the defined types were placed in this encounter.     Derek Jack, MD [place] [phone]

## 2019-05-26 NOTE — Patient Instructions (Addendum)
[place] at [facility] Discharge Instructions  You were seen today by [name]. He went over your recent test results. You have a stage four cancer, we can control your cancer with radiation and treatment. We have sent off for further testing on the biopsy that was done, this will help [place] decide what treatment would be best for you. He will see you back in 3 weeks for follow up.   Thank you for choosing [place] at [facility] to provide your oncology and hematology care.  To afford each patient quality time with our provider, please arrive at least 15 minutes before your scheduled appointment time.   If you have a lab appointment with the [facility] please come in thru the  Main Entrance and check in at the main information desk  You need to re-schedule your appointment should you arrive 10 or more minutes late.  We strive to give you quality time with our providers, and arriving late affects you and other patients whose appointments are after yours.  Also, if you no show three or more times for appointments you may be dismissed from the clinic at the providers discretion.     Again, thank you for choosing [facility].  Our hope is that these requests will decrease the amount of time that you wait before being seen by our physicians.       _____________________________________________________________  Should you have questions after your visit to [facility], please contact our office at [phone] between the hours of 8:00 a.m. and 4:30 p.m.  Voicemails left after 4:00 p.m. will not be returned until the following business day.  For prescription refill requests, have your pharmacy contact our office and allow 72 hours.    Cancer Center Support Programs:   > Cancer Support Group  2nd Tuesday of the month 1pm-2pm, Journey Room

## 2019-05-27 ENCOUNTER — Ambulatory Visit
Admission: RE | Admit: 2019-05-27 | Discharge: 2019-05-27 | Disposition: A | Discharge status: Home / Self Care | Payer: Medicaid Other | Source: Ambulatory Visit | Attending: Radiation Oncology | Admitting: Radiation Oncology

## 2019-05-27 ENCOUNTER — Other Ambulatory Visit: Payer: Self-pay

## 2019-05-27 DIAGNOSIS — C7951 Secondary malignant neoplasm of bone: Secondary | ICD-10-CM | POA: Diagnosis not present

## 2019-05-27 DIAGNOSIS — C7931 Secondary malignant neoplasm of brain: Secondary | ICD-10-CM | POA: Diagnosis not present

## 2019-05-27 DIAGNOSIS — C3411 Malignant neoplasm of upper lobe, right bronchus or lung: Secondary | ICD-10-CM | POA: Diagnosis not present

## 2019-05-30 ENCOUNTER — Encounter (HOSPITAL_COMMUNITY): Payer: Self-pay | Admitting: Hematology

## 2019-05-30 ENCOUNTER — Other Ambulatory Visit: Payer: Self-pay | Admitting: Radiation Oncology

## 2019-05-30 ENCOUNTER — Ambulatory Visit
Admission: RE | Admit: 2019-05-30 | Discharge: 2019-05-30 | Disposition: A | Discharge status: Home / Self Care | Payer: Medicaid Other | Source: Ambulatory Visit | Attending: Radiation Oncology | Admitting: Radiation Oncology

## 2019-05-30 ENCOUNTER — Other Ambulatory Visit: Payer: Self-pay

## 2019-05-30 DIAGNOSIS — C7931 Secondary malignant neoplasm of brain: Secondary | ICD-10-CM | POA: Diagnosis not present

## 2019-05-30 DIAGNOSIS — C3411 Malignant neoplasm of upper lobe, right bronchus or lung: Secondary | ICD-10-CM | POA: Diagnosis not present

## 2019-05-30 DIAGNOSIS — C7951 Secondary malignant neoplasm of bone: Secondary | ICD-10-CM | POA: Diagnosis not present

## 2019-05-30 MED ORDER — PANTOPRAZOLE SODIUM 40 MG PO TBEC: 40.0000 mg | DELAYED_RELEASE_TABLET | Freq: Every day | ORAL | 0 refills | Status: DC

## 2019-05-30 MED ORDER — FOLIC ACID 1 MG PO TABS: 1.0000 mg | ORAL_TABLET | Freq: Every day | ORAL | 0 refills | Status: DC

## 2019-05-30 MED ORDER — LEVETIRACETAM 500 MG PO TABS: 500.0000 mg | ORAL_TABLET | Freq: Two times a day (BID) | ORAL | 0 refills | Status: DC

## 2019-05-30 MED ORDER — DEXAMETHASONE 4 MG PO TABS: 4.0000 mg | ORAL_TABLET | Freq: Two times a day (BID) | ORAL | 0 refills | Status: DC

## 2019-05-31 ENCOUNTER — Encounter: Payer: Self-pay | Admitting: Radiation Oncology

## 2019-05-31 ENCOUNTER — Other Ambulatory Visit: Payer: Self-pay

## 2019-05-31 ENCOUNTER — Ambulatory Visit
Admission: RE | Admit: 2019-05-31 | Discharge: 2019-05-31 | Disposition: A | Discharge status: Home / Self Care | Payer: Medicaid Other | Source: Ambulatory Visit | Attending: Radiation Oncology | Admitting: Radiation Oncology

## 2019-05-31 DIAGNOSIS — C3411 Malignant neoplasm of upper lobe, right bronchus or lung: Secondary | ICD-10-CM | POA: Diagnosis not present

## 2019-05-31 DIAGNOSIS — C7951 Secondary malignant neoplasm of bone: Secondary | ICD-10-CM | POA: Diagnosis not present

## 2019-05-31 DIAGNOSIS — C7931 Secondary malignant neoplasm of brain: Secondary | ICD-10-CM | POA: Diagnosis not present

## 2019-05-31 NOTE — Progress Notes (Signed)
  Radiation Oncology         [phone] ________________________________  Name: Brandon Ferguson         MRN: [id number] DOB: 1961-01-30  This is an update as the patient did not start his taper instructions as instructed on 05/18/2020.  Steroid Taper Instructions following Whole Brain Radiation:  You have a prescription for Dexamethasone (Decadron) 4 mg tablets.   On 06/01/2019:  Take 1/2 of a tablet which is 2 mg by mouth twice a day  On 06/08/2019:  Start taking 1/2 of a tablet which is still 2 mg by mouth once a day  On 1/6//2021: Start taking 1/2 tablet (still 2 mg) by mouth every other day and on 06/22/2019 stop taking this medication.   If during these weeks you experience worsening symptoms of weakness, headache, changes in speech or vision please call our office which could change these instructions.

## 2019-06-01 ENCOUNTER — Ambulatory Visit
Admission: RE | Admit: 2019-06-01 | Discharge: 2019-06-01 | Disposition: A | Discharge status: Home / Self Care | Payer: Medicaid Other | Source: Ambulatory Visit | Attending: Radiation Oncology | Admitting: Radiation Oncology

## 2019-06-01 ENCOUNTER — Other Ambulatory Visit: Payer: Self-pay

## 2019-06-01 DIAGNOSIS — C7931 Secondary malignant neoplasm of brain: Secondary | ICD-10-CM | POA: Diagnosis not present

## 2019-06-01 DIAGNOSIS — C3411 Malignant neoplasm of upper lobe, right bronchus or lung: Secondary | ICD-10-CM | POA: Diagnosis not present

## 2019-06-01 DIAGNOSIS — C7951 Secondary malignant neoplasm of bone: Secondary | ICD-10-CM | POA: Diagnosis not present

## 2019-06-01 NOTE — Progress Notes (Signed)
Late entry from 05/31/2019 at 1633. Patient's sister, Verdene Lennert, presented to the nursing clinic requesting refills of her brother's folic acid, decadron and keppra. Upon further investigation this RN conclude the patient had not begun his decadron taper as instructed. Informed Shona Simpson, PA-C of these findings. Provided [place] with new decadron taper instructions and reviewed them. She verbalized understanding. Explained a refill of the decadron, keppra and folic acid has been electronically prescribed to their preferred pharmacy. Explained that future refills of folic acid would need to come from Dr. Redge Gainer per Shona Simpson, PA-C. Explained future refills of Keppra would need to come from Dr. Mickeal Skinner after the patient is consulted. She verbalized understanding of all reviewed and expressed appreciation for the assistance.

## 2019-06-02 ENCOUNTER — Ambulatory Visit
Admission: RE | Admit: 2019-06-02 | Discharge: 2019-06-02 | Disposition: A | Discharge status: Home / Self Care | Payer: Medicaid Other | Source: Ambulatory Visit | Attending: Radiation Oncology | Admitting: Radiation Oncology

## 2019-06-02 DIAGNOSIS — C3411 Malignant neoplasm of upper lobe, right bronchus or lung: Secondary | ICD-10-CM | POA: Diagnosis not present

## 2019-06-02 DIAGNOSIS — C7951 Secondary malignant neoplasm of bone: Secondary | ICD-10-CM | POA: Diagnosis not present

## 2019-06-02 DIAGNOSIS — C7931 Secondary malignant neoplasm of brain: Secondary | ICD-10-CM | POA: Diagnosis not present

## 2019-06-06 ENCOUNTER — Other Ambulatory Visit: Payer: Self-pay

## 2019-06-06 ENCOUNTER — Ambulatory Visit
Admission: RE | Admit: 2019-06-06 | Discharge: 2019-06-06 | Disposition: A | Discharge status: Home / Self Care | Payer: Medicaid Other | Source: Ambulatory Visit | Attending: Radiation Oncology | Admitting: Radiation Oncology

## 2019-06-06 DIAGNOSIS — C7951 Secondary malignant neoplasm of bone: Secondary | ICD-10-CM | POA: Diagnosis not present

## 2019-06-06 DIAGNOSIS — C3411 Malignant neoplasm of upper lobe, right bronchus or lung: Secondary | ICD-10-CM | POA: Diagnosis not present

## 2019-06-06 DIAGNOSIS — C7931 Secondary malignant neoplasm of brain: Secondary | ICD-10-CM | POA: Diagnosis not present

## 2019-06-07 ENCOUNTER — Encounter (HOSPITAL_COMMUNITY): Payer: Self-pay | Admitting: Hematology

## 2019-06-07 ENCOUNTER — Ambulatory Visit
Admission: RE | Admit: 2019-06-07 | Discharge: 2019-06-07 | Disposition: A | Discharge status: Home / Self Care | Payer: Medicaid Other | Source: Ambulatory Visit | Attending: Radiation Oncology | Admitting: Radiation Oncology

## 2019-06-07 ENCOUNTER — Other Ambulatory Visit: Payer: Self-pay

## 2019-06-07 DIAGNOSIS — C3411 Malignant neoplasm of upper lobe, right bronchus or lung: Secondary | ICD-10-CM | POA: Diagnosis not present

## 2019-06-07 DIAGNOSIS — C7951 Secondary malignant neoplasm of bone: Secondary | ICD-10-CM | POA: Diagnosis not present

## 2019-06-07 DIAGNOSIS — C7931 Secondary malignant neoplasm of brain: Secondary | ICD-10-CM | POA: Diagnosis not present

## 2019-06-08 ENCOUNTER — Other Ambulatory Visit: Payer: Self-pay

## 2019-06-08 ENCOUNTER — Ambulatory Visit
Admission: RE | Admit: 2019-06-08 | Discharge: 2019-06-08 | Disposition: A | Discharge status: Home / Self Care | Payer: Medicaid Other | Source: Ambulatory Visit | Attending: Radiation Oncology | Admitting: Radiation Oncology

## 2019-06-08 DIAGNOSIS — C7951 Secondary malignant neoplasm of bone: Secondary | ICD-10-CM | POA: Diagnosis not present

## 2019-06-08 DIAGNOSIS — C3411 Malignant neoplasm of upper lobe, right bronchus or lung: Secondary | ICD-10-CM | POA: Diagnosis not present

## 2019-06-08 DIAGNOSIS — C7931 Secondary malignant neoplasm of brain: Secondary | ICD-10-CM | POA: Diagnosis not present

## 2019-06-09 ENCOUNTER — Ambulatory Visit
Admission: RE | Admit: 2019-06-09 | Discharge: 2019-06-09 | Disposition: A | Discharge status: Home / Self Care | Payer: Medicaid Other | Source: Ambulatory Visit | Attending: Radiation Oncology | Admitting: Radiation Oncology

## 2019-06-09 ENCOUNTER — Other Ambulatory Visit: Payer: Self-pay

## 2019-06-09 DIAGNOSIS — C7931 Secondary malignant neoplasm of brain: Secondary | ICD-10-CM | POA: Diagnosis not present

## 2019-06-09 DIAGNOSIS — C3411 Malignant neoplasm of upper lobe, right bronchus or lung: Secondary | ICD-10-CM | POA: Diagnosis not present

## 2019-06-09 DIAGNOSIS — C7951 Secondary malignant neoplasm of bone: Secondary | ICD-10-CM | POA: Diagnosis not present

## 2019-06-12 ENCOUNTER — Encounter (HOSPITAL_COMMUNITY): Payer: Self-pay | Admitting: Hematology

## 2019-06-12 NOTE — Assessment & Plan Note (Addendum)
1.  Metastatic adenocarcinoma of the lung: -Presentation with right arm weakness with brain MRI on 04/28/2019 showing widespread metastatic disease to the brain, approximately 22 individual brain metastasis ranging from punctate to 26 mm diameter with vasogenic edema, no midline shift. -Patient underwent bronchoscopy and biopsy x2, unfortunately negative on both occasions. -He had a 45-pack-year smoking history, quit during hospitalization. -PET scan on 05/17/2019 shows right upper lobe hypermetabolic mass 4.2 cm, SUV 14.3.  Right hilar lymph node SUV 6, 12 mm.  Extensive hypermetabolic osseous meta stasis.  Right superior pubic ramus and anterior acetabulum lesion measures 2.9 cm with SUV of 8.9.  Left sacral index lesion measures SUV 14.3.  Hypermetabolic lytic lesion with mild superior endplate irregularity at L4 vertebral body. -Biopsy of the right iliac bone consistent with adenocarcinoma, lung origin. -We discussed the biopsy report in detail.  We have also recommended foundation 1 and PD-L1 testing. -If she does not have any molecular markers, and the PD-L1 less than 50%, he will be considered for carboplatin, pemetrexed and pembrolizumab. -He will proceed with radiation to the metastatic bone lesions at this time. -I will see him back in 3 weeks for follow-up and discuss the results of molecular testing.  2.  Brain metastasis: -XRT to the brain completed on 05/19/2019. -He will continue Keppra 500 mg twice daily.  He will also continue dexamethasone taper.  3.  Back pain: -He reports some back pain and right thigh and knee pain.  He is taking Tylenol as needed.

## 2019-06-13 ENCOUNTER — Other Ambulatory Visit: Payer: Self-pay

## 2019-06-13 ENCOUNTER — Ambulatory Visit
Admission: RE | Admit: 2019-06-13 | Discharge: 2019-06-13 | Disposition: A | Discharge status: Home / Self Care | Payer: Medicaid Other | Source: Ambulatory Visit | Attending: Radiation Oncology | Admitting: Radiation Oncology

## 2019-06-13 DIAGNOSIS — C7931 Secondary malignant neoplasm of brain: Secondary | ICD-10-CM | POA: Diagnosis not present

## 2019-06-13 DIAGNOSIS — C3411 Malignant neoplasm of upper lobe, right bronchus or lung: Secondary | ICD-10-CM | POA: Insufficient documentation

## 2019-06-13 DIAGNOSIS — Z510 Encounter for antineoplastic radiation therapy: Secondary | ICD-10-CM | POA: Insufficient documentation

## 2019-06-13 DIAGNOSIS — C7951 Secondary malignant neoplasm of bone: Secondary | ICD-10-CM | POA: Insufficient documentation

## 2019-06-14 ENCOUNTER — Other Ambulatory Visit: Payer: Self-pay

## 2019-06-14 ENCOUNTER — Encounter: Payer: Self-pay | Admitting: Radiation Oncology

## 2019-06-14 ENCOUNTER — Ambulatory Visit
Admission: RE | Admit: 2019-06-14 | Discharge: 2019-06-14 | Disposition: A | Discharge status: Home / Self Care | Payer: Medicaid Other | Source: Ambulatory Visit | Attending: Radiation Oncology | Admitting: Radiation Oncology

## 2019-06-14 DIAGNOSIS — C7931 Secondary malignant neoplasm of brain: Secondary | ICD-10-CM | POA: Diagnosis not present

## 2019-06-14 DIAGNOSIS — C3411 Malignant neoplasm of upper lobe, right bronchus or lung: Secondary | ICD-10-CM | POA: Diagnosis not present

## 2019-06-14 DIAGNOSIS — Z510 Encounter for antineoplastic radiation therapy: Secondary | ICD-10-CM | POA: Diagnosis not present

## 2019-06-14 DIAGNOSIS — C7951 Secondary malignant neoplasm of bone: Secondary | ICD-10-CM | POA: Diagnosis not present

## 2019-06-16 ENCOUNTER — Encounter: Payer: Self-pay | Admitting: General Practice

## 2019-06-16 ENCOUNTER — Telehealth: Payer: Self-pay | Admitting: General Practice

## 2019-06-16 ENCOUNTER — Other Ambulatory Visit: Payer: Self-pay

## 2019-06-16 ENCOUNTER — Encounter (HOSPITAL_COMMUNITY): Payer: Self-pay | Admitting: Hematology

## 2019-06-16 ENCOUNTER — Inpatient Hospital Stay (HOSPITAL_COMMUNITY): Payer: Medicaid Other | Attending: Hematology | Admitting: Hematology

## 2019-06-16 VITALS — BP 102/66 | HR 112 | Temp 97.0°F | Resp 20 | Wt 143.1 lb

## 2019-06-16 DIAGNOSIS — Z79899 Other long term (current) drug therapy: Secondary | ICD-10-CM | POA: Diagnosis not present

## 2019-06-16 DIAGNOSIS — C7951 Secondary malignant neoplasm of bone: Secondary | ICD-10-CM | POA: Diagnosis not present

## 2019-06-16 DIAGNOSIS — Z5112 Encounter for antineoplastic immunotherapy: Secondary | ICD-10-CM | POA: Diagnosis not present

## 2019-06-16 DIAGNOSIS — C7931 Secondary malignant neoplasm of brain: Secondary | ICD-10-CM | POA: Diagnosis not present

## 2019-06-16 DIAGNOSIS — C3411 Malignant neoplasm of upper lobe, right bronchus or lung: Secondary | ICD-10-CM | POA: Insufficient documentation

## 2019-06-16 DIAGNOSIS — Z5111 Encounter for antineoplastic chemotherapy: Secondary | ICD-10-CM | POA: Insufficient documentation

## 2019-06-16 DIAGNOSIS — Z7189 Other specified counseling: Secondary | ICD-10-CM

## 2019-06-16 LAB — ACID FAST CULTURE WITH REFLEXED SENSITIVITIES (MYCOBACTERIA): Acid Fast Culture: NEGATIVE

## 2019-06-16 MED ORDER — CYANOCOBALAMIN 1000 MCG/ML IJ SOLN
INTRAMUSCULAR | Status: AC
  Filled 2019-06-16: qty 1

## 2019-06-16 MED ORDER — CYANOCOBALAMIN 1000 MCG/ML IJ SOLN
1000.0000 ug | Freq: Once | INTRAMUSCULAR | Status: AC
  Administered 2019-06-16: 1000 ug via INTRAMUSCULAR

## 2019-06-16 MED ORDER — FOLIC ACID 1 MG PO TABS: 1.0000 mg | ORAL_TABLET | Freq: Every day | ORAL | 3 refills | Status: DC

## 2019-06-16 NOTE — Patient Instructions (Addendum)
[place] at [facility] Discharge Instructions  You were seen today by [name]. He went over your recent test results. Those results showed that you are not eligible for a chemo pill. You will need combination therapy of Chemotherapy and immunotherapy. You will take this once every 3 weeks. He will send in a new prescription for Folic acid 1 mg to take daily starting now. He will give you a B12 injection today. He will schedule you for a port-a-cath placement with Dr. [name]. He will see you back in 2 weeks for labs, treatment and follow up.   Thank you for choosing [place] at [facility] to provide your oncology and hematology care.  To afford each patient quality time with our provider, please arrive at least 15 minutes before your scheduled appointment time.   If you have a lab appointment with the [facility] please come in thru the  Main Entrance and check in at the main information desk  You need to re-schedule your appointment should you arrive 10 or more minutes late.  We strive to give you quality time with our providers, and arriving late affects you and other patients whose appointments are after yours.  Also, if you no show three or more times for appointments you may be dismissed from the clinic at the providers discretion.     Again, thank you for choosing [facility].  Our hope is that these requests will decrease the amount of time that you wait before being seen by our physicians.       _____________________________________________________________  Should you have questions after your visit to [facility], please contact our office at [phone] between the hours of 8:00 a.m. and 4:30 p.m.  Voicemails left after 4:00 p.m. will not be returned until the following business day.  For prescription refill requests, have your pharmacy contact our office and allow 72 hours.    Cancer Center Support  Programs:   > Cancer Support Group  2nd Tuesday of the month 1pm-2pm, Journey Room

## 2019-06-16 NOTE — Telephone Encounter (Signed)
[facility] CSW Progress Notes  Request from Venita Lick to connect w patient re resources for insurance and financial help.  Currently unemployed and without insurance.  Unable to reach patient, left VM w my contact information and encouragement to call me back.  Edwyna Shell, LCSW Clinical Social Worker Phone:  [phone] Cell:  [phone]

## 2019-06-16 NOTE — Assessment & Plan Note (Addendum)
1.  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 -PET scan on 05/17/2019 shows right upper lobe hypermetabolic mass 4.2 cm.  Right hilar lymph node 12 mm.  Extensive hypermetabolic osseous meta stasis with right superior pubic ramus, anterior acetabulum lesion, left sacral lesion, L4 vertebral body. -Biopsy of the right iliac bone on 05/20/2019 consistent with adenocarcinoma. -I have recommended palliative chemotherapy with combination of carboplatin, pemetrexed and pembrolizumab. -We have discussed the side effects in detail.  We will plan to rescan him after 2-3 cycles.  Treatment intent in the palliative setting was discussed with the patient. -He will be referred for port placement.  We have given him B12 injection today.  He will be sent folic acid tablet. -Tentative start date is in the next 1 to 2 weeks.  If we do not have a port by then, we will start by peripheral access for first cycle.  2.  Brain metastasis: -Presentation with right arm weakness with brain MRI on 04/28/2019 showing widespread metastatic disease to the brain, approximately 22 individual brain lesions. -Whole brain radiation therapy completed on 05/19/2019. -Radiation therapy to the bone metastasis completed on 06/14/2019.  3.  Back pain: -He has back pain and right thigh and knee pain.  He is taking Naprosyn as needed.  4.  Social issues: -Patient lives by himself at home.  He has several siblings who are working. -He is accompanied by his sister who is retired today.  She told us that she will make sure somebody will be bringing him for treatments and checking on him at home.

## 2019-06-16 NOTE — Progress Notes (Signed)
Brandon Ferguson, Brandon Ferguson [id number]   CLINIC:  Medical Oncology/Hematology  PCP:  Patient, No Pcp Per No address on file None   REASON FOR VISIT:  Metastatic adenocarcinoma of the lung.  CURRENT THERAPY: Carboplatin, pembrolizumab and pemetrexed.  BRIEF ONCOLOGIC HISTORY:  Oncology History  Malignant neoplasm of upper lobe of right lung ([place])  05/02/2019 Initial Diagnosis   Malignant neoplasm of upper lobe of right lung ([place])   05/19/2019 Cancer Staging   Staging form: Lung, AJCC 8th Edition - Clinical stage from 05/19/2019: Stage IVB (cT2b, cN1, pM1c) - Signed by Derek Jack, MD on 05/19/2019   06/22/2019 -  Chemotherapy   The patient had PALONOSETRON HCL INJECTION 0.25 MG/5ML, 0.25 mg, Intravenous,  Once, 0 of 4 cycles PEMEtrexed (ALIMTA) 900 mg in sodium chloride 0.9 % 100 mL chemo infusion, 500 mg/m2, Intravenous,  Once, 0 of 6 cycles CARBOplatin (PARAPLATIN) in sodium chloride 0.9 % 100 mL chemo infusion, , Intravenous,  Once, 0 of 4 cycles FOSAPREPITANT IV INFUSION 150 MG, 150 mg, Intravenous,  Once, 0 of 4 cycles pembrolizumab (KEYTRUDA) 200 mg in sodium chloride 0.9 % 50 mL chemo infusion, 200 mg, Intravenous, Once, 0 of 6 cycles  for chemotherapy treatment.       CANCER STAGING: Cancer Staging Malignant neoplasm of upper lobe of right lung [facility]) Staging form: Lung, AJCC 8th Edition - Clinical stage from 05/19/2019: Stage IVB (cT2b, cN1, pM1c) - Signed by Derek Jack, MD on 05/19/2019    INTERVAL HISTORY:  Brandon Ferguson 59 y.o. male seen for follow-up of metastatic adenocarcinoma of the lung.  He reportedly finished radiation therapy to the bones 2 days ago.  He reports appetite of 75% and energy levels of 75%.  Pain in the low back is reported as 9 out of 10.  Occasional cough is stable.  He has trouble swallowing with certain solid foods.    REVIEW OF SYSTEMS:  Review of Systems  Musculoskeletal:       Low  back pain, right mid thigh and right knee pain.  All other systems reviewed and are negative.    PAST MEDICAL/SURGICAL HISTORY:  Past Medical History:  Diagnosis Date  . Iron deficiency   . Vitamin D deficiency    Past Surgical History:  Procedure Laterality Date  . KNEE SURGERY    . VIDEO BRONCHOSCOPY Bilateral 04/29/2019   Procedure: VIDEO BRONCHOSCOPY WITH FLUORO;  Surgeon: Laurin Coder, MD;  Location: [facility] ENDOSCOPY;  Service: Endoscopy;  Laterality: Bilateral;  . VIDEO BRONCHOSCOPY Bilateral 05/03/2019   Procedure: VIDEO BRONCHOSCOPY WITHOUT FLUORO;  Surgeon: Rigoberto Noel, MD;  Location: [facility];  Service: Cardiopulmonary;  Laterality: Bilateral;     SOCIAL HISTORY:  Social History   Socioeconomic History  . Marital status: Single    Spouse name: Not on file  . Number of children: Not on file  . Years of education: Not on file  . Highest education level: Not on file  Occupational History  . Not on file  Tobacco Use  . Smoking status: Current Every Day Smoker    Packs/day: 1.00    Types: Cigarettes  . Smokeless tobacco: Never Used  Substance and Sexual Activity  . Alcohol use: Yes    Alcohol/week: 6.0 standard drinks    Types: 6 Cans of beer per week    Comment: pt drinks 3 large beers daily  . Drug use: Yes    Types: Marijuana    Comment: daily  .  Sexual activity: Not on file  Other Topics Concern  . Not on file  Social History Narrative  . Not on file   Social Determinants of Health   Financial Resource Strain: Low Risk   . Difficulty of Paying Living Expenses: Not very hard  Food Insecurity: No Food Insecurity  . Worried About Charity fundraiser in the Last Year: Never true  . Ran Out of Food in the Last Year: Never true  Transportation Needs: No Transportation Needs  . Lack of Transportation (Medical): No  . Lack of Transportation (Non-Medical): No  Physical Activity: Inactive  . Days of Exercise per Week: 0 days  . Minutes of  Exercise per Session: 0 min  Stress: No Stress Concern Present  . Feeling of Stress : Only a little  Social Connections: Somewhat Isolated  . Frequency of Communication with Friends and Family: More than three times a week  . Frequency of Social Gatherings with Friends and Family: More than three times a week  . Attends Religious Services: 1 to 4 times per year  . Active Member of Clubs or Organizations: No  . Attends Archivist Meetings: Never  . Marital Status: Never married  Intimate Partner Violence: Not At Risk  . Fear of Current or Ex-Partner: No  . Emotionally Abused: No  . Physically Abused: No  . Sexually Abused: No    FAMILY HISTORY:  Family History  Problem Relation Age of Onset  . Breast cancer Mother   . Cirrhosis Father   . Alcohol abuse Father   . Hypertension Sister   . Cancer Brother   . Hypertension Sister   . Hypertension Sister   . Hypertension Sister   . Hypertension Sister     CURRENT MEDICATIONS:  Outpatient Encounter Medications as of 06/16/2019  Medication Sig  . dexamethasone (DECADRON) 4 MG tablet Take 1 tablet (4 mg total) by mouth 2 (two) times daily.  . ferrous sulfate 325 (65 FE) MG EC tablet Take 325 mg by mouth daily.  . folic acid (FOLVITE) 1 MG tablet Take 1 tablet (1 mg total) by mouth daily.  [name] Kitchen levETIRAcetam (KEPPRA) 500 MG tablet Take 1 tablet (500 mg total) by mouth 2 (two) times daily.  . pantoprazole (PROTONIX) 40 MG tablet Take 1 tablet (40 mg total) by mouth daily.  [name] Kitchen thiamine 100 MG tablet Take 1 tablet (100 mg total) by mouth daily.  [name] Kitchen acetaminophen (TYLENOL) 500 MG tablet Take 500 mg by mouth every 6 (six) hours as needed for headache.  . folic acid (FOLVITE) 1 MG tablet Take 1 tablet (1 mg total) by mouth daily.  . naproxen sodium (ALEVE) 220 MG tablet Take 220 mg by mouth 2 (two) times daily as needed (for headache).  . polyethylene glycol (MIRALAX / GLYCOLAX) 17 g packet Take 17 g by mouth daily as needed. (Patient  not taking: Reported on 05/18/2019)  . [EXPIRED] cyanocobalamin ((VITAMIN B-12)) injection 1,000 mcg    No facility-administered encounter medications on file as of 06/16/2019.    ALLERGIES:  No Known Allergies   PHYSICAL EXAM:  ECOG Performance status: 1  Vitals:   06/16/19 1142  BP: 102/66  Pulse: (!) 112  Resp: 20  Temp: (!) 97 F (36.1 C)  SpO2: 96%   Filed Weights   06/16/19 1142  Weight: 143 lb 1.6 oz (64.9 kg)    Physical Exam Vitals reviewed.  Constitutional:      Appearance: Normal appearance.  Cardiovascular:  Rate and Rhythm: Normal rate and regular rhythm.     Heart sounds: Normal heart sounds.  Pulmonary:     Effort: Pulmonary effort is normal.     Breath sounds: Normal breath sounds.  Abdominal:     General: There is no distension.     Palpations: Abdomen is soft. There is no mass.  Skin:    General: Skin is warm.  Neurological:     Mental Status: He is alert and oriented to person, place, and time.  Psychiatric:        Mood and Affect: Mood normal.        Behavior: Behavior normal.      LABORATORY DATA:  I have reviewed the labs as listed.  CBC    Component Value Date/Time   WBC 14.1 (H) 05/20/2019 1001   RBC 3.58 (L) 05/20/2019 1001   HGB 10.7 (L) 05/20/2019 1001   HCT 33.7 (L) 05/20/2019 1001   PLT 242 05/20/2019 1001   MCV 94.1 05/20/2019 1001   MCH 29.9 05/20/2019 1001   MCHC 31.8 05/20/2019 1001   RDW 15.7 (H) 05/20/2019 1001   LYMPHSABS 2.1 05/04/2019 0531   MONOABS 0.9 05/04/2019 0531   EOSABS 0.0 05/04/2019 0531   BASOSABS 0.0 05/04/2019 0531   CMP Latest Ref Rng & Units 05/04/2019 04/30/2019 04/27/2019  Glucose 70 - 99 mg/dL 154(H) 238(H) 153(H)  BUN 6 - 20 mg/dL _0 Creatinine 0.61 - 1.24 mg/dL 0.47(L) 0.65 0.77  Sodium 135 - 145 mmol/L 130(L) 132(L) 135  Potassium 3.5 - 5.1 mmol/L 4.1 4.1 3.4(L)  Chloride 98 - 111 mmol/L 98 97(L) 102  CO2 22 - 32 mmol/L _1 Calcium 8.9 - 10.3 mg/dL 9.6 9.7 9.4  Total  Protein 6.5 - 8.1 g/dL - - 8.4(H)  Total Bilirubin 0.3 - 1.2 mg/dL - - 0.6  Alkaline Phos 38 - 126 U/L - - 100  AST 15 - 41 U/L - - 20  ALT 0 - 44 U/L - - 14       DIAGNOSTIC IMAGING:  I have independently reviewed the scans and discussed with the patient.    ASSESSMENT & PLAN:   Malignant neoplasm of upper lobe of right lung ([employer]) 1.  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 -PET scan on 05/17/2019 shows right upper lobe hypermetabolic mass 4.2 cm.  Right hilar lymph node 12 mm.  Extensive hypermetabolic osseous meta stasis with right superior pubic ramus, anterior acetabulum lesion, left sacral lesion, L4 vertebral body. -Biopsy of the right iliac bone on 05/20/2019 consistent with adenocarcinoma. -I have recommended palliative chemotherapy with combination of carboplatin, pemetrexed and pembrolizumab. -We have discussed the side effects in detail.  We will plan to rescan him after 2-3 cycles.  Treatment intent in the palliative setting was discussed with the patient. -He will be referred for port placement.  We have given him B12 injection today.  He will be sent folic acid tablet. -Tentative start date is in the next 1 to 2 weeks.  If we do not have a port by then, we will start by peripheral access for first cycle.  2.  Brain metastasis: -Presentation with right arm weakness with brain MRI on 04/28/2019 showing widespread metastatic disease to the brain, approximately 22 individual brain lesions. -Whole brain radiation therapy completed on 05/19/2019. -Radiation therapy to the bone metastasis completed on 06/14/2019.  3.  Back pain: -He  has back pain and right thigh and knee pain.  He is taking Naprosyn as needed.  4.  Social issues: -Patient lives by himself at home.  He has several siblings who are working. -He is accompanied by his sister who is retired today.  She told us that she will make  sure somebody will be bringing him for treatments and checking on him at home.    Orders placed this encounter:  No orders of the defined types were placed in this encounter.  Total time spent is 40 minutes with more than 50% of the time spent face-to-face discussing results of molecular testing, treatment plan, side effects, counseling and coordination of care.   Derek Jack, MD [place] [phone]

## 2019-06-16 NOTE — Progress Notes (Signed)
START ON PATHWAY REGIMEN - Non-Small Cell Lung     A cycle is every 21 days:     Pembrolizumab      Pemetrexed      Carboplatin   **Always confirm dose/schedule in your pharmacy ordering system**  Patient Characteristics: Stage IV Metastatic, Nonsquamous, Initial Chemotherapy/Immunotherapy, PS = 0, 1, ALK Rearrangement Negative and ROS1 Rearrangement Negative and NTRK Gene Fusion?Negative and RET Gene Fusion?Negative and EGFR Mutation Negative/Non?Sensitizing, PD-L1  Expression Positive 1-49% (TPS) / Negative / Not Tested / Awaiting Test Results and Immunotherapy Candidate AJCC T Category: T3 Current Disease Status: Distant Metastases AJCC N Category: N2 AJCC M Category: M1c AJCC 8 Stage Grouping: IVB Histology: Nonsquamous Cell ROS1 Rearrangement Status: Negative T790M Mutation Status: Not Applicable - EGFR Mutation Negative/Unknown Other Mutations/Biomarkers: No Other Actionable Mutations Chemotherapy/Immunotherapy LOT: Initial Chemotherapy/Immunotherapy Molecular Targeted Therapy: Not Appropriate MET Exon 14 Mutation Status: Negative RET Gene Fusion Status: Negative EGFR Mutation Status: Negative/Wild Type NTRK Gene Fusion Status: Negative PD-L1 Expression Status: PD-L1 Positive 1-49% (TPS) ALK Rearrangement Status: Negative BRAF V600E Mutation Status: Negative ECOG Performance Status: 1 Biomarker Assessment Status Confirmation: All Genomic Markers Negative or Only MET+ or BRAF+ Immunotherapy Candidate Status: Candidate for Immunotherapy Intent of Therapy: Non-Curative / Palliative Intent, Discussed with Patient

## 2019-06-17 ENCOUNTER — Ambulatory Visit (HOSPITAL_COMMUNITY): Payer: Self-pay

## 2019-06-17 ENCOUNTER — Encounter (HOSPITAL_COMMUNITY): Payer: Self-pay

## 2019-06-17 ENCOUNTER — Telehealth (HOSPITAL_COMMUNITY): Payer: Self-pay | Admitting: General Practice

## 2019-06-17 DIAGNOSIS — Z95828 Presence of other vascular implants and grafts: Secondary | ICD-10-CM | POA: Insufficient documentation

## 2019-06-17 HISTORY — DX: Presence of other vascular implants and grafts: Z95.828

## 2019-06-17 NOTE — Progress Notes (Signed)
Nutrition Follow-up:  Patient with non small cell lung cancer with metastatic disease to brain.  Patient completed radiation on 12/10 (brain) and bone metastasis completed on 06/14/19.    Spoke with patient via phone this am.  Patient reports that he just ate a whole [place] sandwich and 1 whole chicken salad sandwich.  Ate cereal for breakfast this am.  Planning spaghetti and meatballs for dinner tonight (canned).  Drinks ensure 1 daily (adds to coffee).  Likes ice cream, Little Debbie snack cakes, popcorn, yogurt and pudding for snacks.  Reports that he does not have any nausea and bowels are moving normally.  Reports that he is eating more.    Patient getting ready to start chemotherapy.      Medications: reviewed  Labs: reviewed  Anthropometrics:   Weight 143 lb 1.6 oz on 1/7 decreased from 152 lb on 12/9   NUTRITION DIAGNOSIS: Inadequate oral intake continues with weight loss    INTERVENTION:  Will provide another case of ensure enlive for patient to pick up at 1/14 at next appt.  Patient agreeable Encouraged patient to eat 3 meals per day and add snacks.  Discussed high calorie, high protein foods. Patient has contact information    MONITORING, EVALUATION, GOAL: Patient will consume adequate calories and protein to maintain weight during treatment.   NEXT VISIT: phone f/u on Feb 5  Brandon Ferguson, [place], [place] Registered Dietitian [phone] (pager)

## 2019-06-17 NOTE — Telephone Encounter (Signed)
Brandon Ferguson Cancer CSW Progress Notes  Call to patient at request of Verdis Frederickson, LPN.  Patient is uninsured and has no income.  CSW to assess and provide resources as appropriate.  Second call to patient, left VM w my contact information.  Also scheduled to see patient in infusion at his 1/26 visit.  Edwyna Shell, LCSW Clinical Social Worker Phone:  [phone] Cell:  [phone]

## 2019-06-21 ENCOUNTER — Telehealth: Payer: Self-pay | Admitting: Radiation Oncology

## 2019-06-21 ENCOUNTER — Other Ambulatory Visit (HOSPITAL_COMMUNITY): Payer: Self-pay | Admitting: Hematology

## 2019-06-21 ENCOUNTER — Telehealth: Payer: Self-pay | Admitting: General Practice

## 2019-06-21 MED ORDER — LIDOCAINE-PRILOCAINE 2.5-2.5 % EX CREA: TOPICAL_CREAM | CUTANEOUS | 3 refills | Status: DC

## 2019-06-21 MED ORDER — PROCHLORPERAZINE MALEATE 10 MG PO TABS: 10.0000 mg | ORAL_TABLET | Freq: Four times a day (QID) | ORAL | 1 refills | Status: AC | PRN

## 2019-06-21 NOTE — Patient Instructions (Addendum)
[facility]   You are diagnosed with metastatic (Stage IV) lung cancer.  You will come to the clinic every three weeks to receive a combination of chemotherapy and immunotherapy drugs - carboplatin, pemetrexed (Alimta), and pembrolizumab (Keytruda).  After four cycles of this combination of medications, you will receive only Keytruda as maintenance therapy.  The intent of this treatment is to help control your cancer, prevent it from spreading further, and to alleviate any symptoms you may be having related to your disease.  You will see the doctor regularly throughout treatment.  We will obtain blood work from you prior to every treatment and monitor your results to make sure it is safe to give your treatment. The doctor monitors your response to treatment by the way you are feeling, your blood work, and by obtaining scans periodically.  There will be wait times while you are here for treatment.  It will take about 30 minutes to 1 hour for your lab work to result.  Then there will be wait times while pharmacy mixes your medications.   You must take folic acid every day by mouth beginning 7 days before your first dose of alimta.  You must keep taking folic acid every day during the time you are being treated with alimta, and for every day for 21 days after you receive your last alimta dose.    You will get B12 injections while you are on alimta.  The first one about 1 week prior to starting treatment and then about every 9 weeks during treatment.   Medications you will receive in the clinic prior to your chemotherapy medications:  Aloxi:  ALOXI is used in adults to help prevent nausea and vomiting that happens with certain chemotherapy drugs.  Aloxi is a long acting medication, and will remain in your system for about two days.   Emend:  This is an anti-nausea medication that is used with Aloxi to help prevent nausea and vomiting caused by  chemotherapy.  Dexamethasone:  This is a steroid given prior to chemotherapy to help prevent allergic reactions; it may also help prevent and control nausea and diarrhea.    Carboplatin (Generic Name) Other Names: Paraplatin, CBDCA  About This Drug Carboplatin is a drug used to treat cancer. This drug is given in the vein (IV).  It takes 30 minutes to infuse.   Possible Side Effects (More Common) . Nausea and throwing up (vomiting). These symptoms may happen within a few hours after your treatment and may last up to 24 hours. Medicines are available to stop or lessen these side effects.  . Bone marrow depression. This is a decrease in the number of white blood cells, red blood cells, and platelets. This may raise your risk of infection, make you tired and weak (fatigue), and raise your risk of bleeding.  . Soreness of the mouth and throat. You may have red areas, white patches, or sores that hurt.  . This drug may affect how your kidneys work. Your kidney function will be checked as needed.  . Electrolyte changes. Your blood will be checked for electrolyte changes as needed.  Possible Side Effects (Less Common)  . Hair loss. Some patients lose their hair on the scalp and body. You may notice your hair thinning seven to 14 days after getting this drug.  . Effects on the nerves are called peripheral neuropathy. You may feel numbness, tingling, or pain in your hands and feet. It may be hard  for you to button your clothes, open jars, or walk as usual. The effect on the nerves may get worse with more doses of the drug. These effects get better in some people after the drug is stopped but it does not get better in all people.  . Loose bowel movements (diarrhea) that may last for several days  . Decreased hearing or ringing in the ears  . Changes in the way food and drinks taste  . Changes in liver function. Your liver function will be checked as needed  Allergic Reactions Serious  allergic reactions including anaphylaxis are rare. While you are getting this drug in your vein (IV), tell your nurse right away if you have any of these symptoms of an allergic reaction:  . Trouble catching your breath  . Feeling like your tongue or throat are swelling  . Feeling your heart beat quickly or in a not normal way (palpitations)  . Feeling dizzy or lightheaded  . Flushing, itching, rash, and/or hives  Treating Side Effects . Drink 6-8 cups of fluids each day unless your doctor has told you to limit your fluid intake due to some other health problem. A cup is 8 ounces of fluid. If you throw up or have loose bowel movements, you should drink more fluids so that you do not become dehydrated (lack water in the body from losing too much fluid).  . Mouth care is very important. Your mouth care should consist of routine, gentle cleaning of your teeth or dentures and rinsing your mouth with a mixture of 1/2 teaspoon of salt in 8 ounces of water or  teaspoon of baking soda in 8 ounces of water. This should be done at least after each meal and at bedtime.  . If you have mouth sores, avoid mouthwash that has alcohol. Avoid alcohol and smoking because they can bother your mouth and throat.  . If you have numbness and tingling in your hands and feet, be careful when cooking, walking, and handling sharp objects and hot liquids.  . Talk with your nurse about getting a wig before you lose your hair. Also, call the [facility] at 800-ACS-2345 to find out information about the "Look Good, Feel Better" program close to where you live. It is a free program where women getting chemotherapy can learn about wigs, turbans and scarves as well as makeup techniques and skin and nail care.  Food and Drug Interactions There are no known interactions of carboplatin with food. This drug may interact with other medicines. Tell your doctor and pharmacist about all the medicines and dietary  supplements (vitamins, minerals, herbs and others) that you are taking at this time. The safety and use of dietary supplements and alternative diets are often not known. Using these might affect your cancer or interfere with your treatment. Until more is known, you should not use dietary supplements or alternative diets without your cancer doctor's help.  When to Call the Doctor Call your doctor or nurse right away if you have any of these symptoms:  . Fever of 100.4 F (38 C) or above; chills  . Bleeding or bruising that is not normal  . Wheezing or trouble breathing  . Nausea that stops you from eating or drinking  . Vomiting  . Rash or itching  . Loose bowel movements (diarrhea) more than four times a day or diarrhea with weakness or feeling lightheaded  Call your doctor or nurse as soon as possible if any of these  symptoms happen:  . Numbness, tingling, decreased feeling or weakness in fingers, toes, arms, or legs  . Change in hearing, ringing in the ears  . Blurred vision or other changes in eyesight  . Decreased urine  . Yellowing of skin or eyes  Sexual Problems and Reproductive Concerns Sexual problems and reproduction concerns may happen. In both men and women, this drug may affect your ability to have children. This cannot be determined before your treatment. Talk with your doctor or nurse if you plan to have children. Ask for information on sperm or egg banking. In men, this drug may interfere with your ability to make sperm, but it should not change your ability to have sexual relations. In women, menstrual bleeding may become irregular or stop while you are getting this drug. Do not assume that you cannot become pregnant if you do not have a menstrual period. Women may go through signs of menopause (change of life) like vaginal dryness or itching. Vaginal lubricants can be used to lessen vaginal dryness, itching, and pain during sexual relations. Genetic counseling is  available for you to talk about the effects of this drug therapy on future pregnancies. Also, a genetic counselor can look at the possible risk of problems in the unborn baby due to this medicine if an exposure happens during pregnancy. . Pregnancy warning: This drug may have harmful effects on the unborn child, so effective methods of birth control should be used during your cancer treatment. . Breast feeding warning: It is not known if this drug passes into breast milk. For this reason, women should talk to their doctor about the risks and benefits of breast feeding during treatment with this drug because this drug may enter the breast milk and badly harm a breast feeding baby.   Pemetrexed (Generic Name) Other Names: ALIMTA  About This Drug Pemetrexed is used to treat cancer. It is given in the vein (IV).  Takes 10 minutes to infuse.   Possible Side Effects (More Common)  . Bone marrow depression. This is a decrease in the number of white blood cells, red blood cells, and platelets. This may raise your risk of infection, make you feel tired and weak (fatigue), and raise your risk of bleeding.  . Fatigue  . Soreness of the mouth and throat. You may have red areas, white patches, or sores that hurt.  Possible Side Effects (less common)  . Trouble breathing or feeling short of breath  . Nausea and vomiting  . Skin rash  Treating Side Effects  . Ask your doctor or nurse about medicine that is available to help stop or lessen nausea and throwing up.  . If you get a rash, do not put anything on it unless your doctor or nurse says you may. Keep the area around the rash clean and dry.  . Mouth care is very important. Your mouth care should consist of routine, gentle cleaning of your teeth or dentures and rinsing your mouth with a mixture of 1/2 teaspoon of salt in 8 ounces of water or  teaspoon of baking soda in 8 ounces of water. This should be done at least after each meal and at  bedtime.  . If you have mouth sores, avoid mouthwash that has alcohol. Avoid alcohol and smoking because they can bother your mouth and throat.  Food and Drug Interactions There are no known interactions of this medicine with food. Tell your doctor if you are taking ibuprofen. Pemetrexed may interact with other  medicines. Tell your doctor and pharmacist about all the medicines and dietary supplements (vitamins, minerals, herbs and others) that you are taking at this time. The safety and use of dietary supplements and alternative diets are often not known. Using these might affect your cancer or interfere with your treatment. Until more is known, you should not use dietary supplements or alternative diets without your cancer doctor's help.  When to Call the Doctor Call your doctor or nurse right away if you have any of these symptoms:  . Temperature of 100.4 F (38 C) or above  . Chills  . Easy bruising or bleeding  . Trouble breathing  . Chest pain  . Nausea that stops you from eating or drinking  . Throwing up/vomiting  Call your doctor or nurse as soon as possible if you have any of these symptoms:  . Rash that does not go away with prescribed medicine  . Nausea or vomiting that does not go away with prescribed medicine  . Extreme fatigue that interferes with normal activities  Reproduction Concerns . Pregnancy warning: This drug may have harmful effects on the unborn child, so effective methods of birth control should be used during your cancer treatment.  . Genetic counseling is available for you to talk about the effects of this drug therapy on future pregnancies. Also, a genetic counselor can look at the possible risk of problems in the unborn baby due to this medicine if an exposure happens during pregnancy.  . Breast feeding warning: It is not known if this drug passes into breast milk. For this reason, women should talk to their doctor about the risks and benefits of  breast feeding during treatment with this drug because this drug may enter the breast milk and badly harm a breast feeding baby.   Pembrolizumab Beryle Flock)  About This Drug Pembrolizumab is used to treat cancer. It is given in the vein (IV).  This drug will take 30 minutes to infuse.  Possible Side Effects . Tiredness . Fever . Nausea . Decreased appetite (decreased hunger) . Loose bowel movements (diarrhea) . Constipation (not able to move bowels) . Trouble breathing . Rash . Itching . Muscle and bone pain . Cough  Note: Each of the side effects above was reported in 20% or greater of patients treated with pembrolizumab. Not all possible side effects are included above.  Warnings and Precautions  . This drug works with your immune system and can cause inflammation in any of your organs and tissues and can change how they work. This may put you at risk for developing serious medical problems which can very rarely be fatal.  . Colitis (swelling or inflammation in the colon) - symptoms are loose bowel movements (diarrhea) stomach cramping, and sometimes blood in the stool  . Changes in liver function. Your liver function will be checked as needed.  . Changes in kidney function, which can very rarely be fatal. Your kidney function will be checked as needed.  . Inflammation (swelling) of the lungs which can very rarely be fatal - you may have a dry cough or trouble breathing.  . This drug may affect some of your hormone glands (especially the thyroid, adrenals, pituitary and pancreas). Your hormone levels will be checked as needed.  . Blood sugar levels may change and you may develop diabetes. If you already have diabetes, changes may need to be made to your diabetes medication.  . Severe allergic skin reaction, which can very rarely be fatal. You  may develop blisters on your skin that are filled with fluid or a severe red rash all over your body that may be painful.  .  Increased risk of organ rejection in patients who have received donor organs  . Increased risk of complications in patients who will undergo a stem cell transplant after receiving pembrolizumab.  . While you are getting this drug in your vein (IV), you may have a reaction to the drug. Your nurse will check you closely for these signs: fever or shaking chills, flushing, facial swelling, feeling dizzy, headache, trouble breathing, rash, itching, chest tightness, or chest pain. These reactions may occur after your infusion. If this happens, call 911 for emergency care.  Important Information  This drug may be present in the saliva, tears, sweat, urine, stool, vomit, semen, and vaginal secretions. Talk to your doctor and/or your nurse about the necessary precautions to take during this time.  Treating Side Effects  . Ask your doctor or nurse about medicines that are available to help stop or lessen constipation, diarrhea and/or nausea.  . Drink plenty of fluids (a minimum of eight glasses per day is recommended).  . If you are not able to move your bowels, check with your doctor or nurse before you use any enemas, laxatives, or suppositories  . To help with nausea and vomiting, eat small, frequent meals instead of three large meals a day. Choose foods and drinks that are at room temperature. Ask your nurse or doctor about other helpful tips and medicine that is available to help or stop lessen these symptoms.  . If you get diarrhea, eat low-fiber foods that are high in protein and calories and avoid foods that can irritate your digestive tracts or lead to cramping. Ask your nurse or doctor about medicine that can lessen or stop your diarrhea.  . Manage tiredness by pacing your activities for the day. Be sure to include periods of rest between energy-draining activities  . Keeping your pain under control is important to your wellbeing. Please tell your doctor or nurse if you are experiencing  pain.  . If you have diabetes, keep good control of your blood sugar level. Tell your nurse or your doctor if your glucose levels are higher or lower than normal  . If you get a rash do not put anything on it unless your doctor or nurse says you may. Keep the area around the rash clean and dry. Ask your doctor for medicine if your rash bothers you.  . Infusion reactions may happen for 24 hours after your infusion. If this happens, call 911 for emergency care.  Food and Drug Interactions  . There are no known interactions of pembrolizumab with food.  . There are no known interactions of pembrolizumab with other medications.  . Tell your doctor and pharmacist about all the medicines and dietary supplements (vitamins, minerals, herbs and others) that you are taking at this time. The safety and use of dietary supplements and alternative agents are often not known. Using these might affect your cancer or interfere with your treatment. Until more is known, you should not use dietary supplements or alternative agents without your cancer doctor's help.   When to Call the Doctor Call your doctor or nurse if you have any of the following symptoms and/or any new or unusual symptoms:  . Fever of 100.4 F (38 C) or higher  . Chills  . Wheezing or trouble breathing  . Rash or itching  . Feeling dizzy  or lightheaded  . Loose bowel movements (diarrhea) more than 4 times a day or diarrhea with weakness or lightheadedness, or diarrhea that is not controlled by medications  . Nausea that stops you from eating or drinking, and/or that is not relieved by prescribed medicines  . Lasting loss of appetite or rapid weight loss of five pounds in a week  . Fatigue that interferes with your daily activities  . No bowel movement for 3 days or you feel uncomfortable  . Extreme weakness that interferes with normal activities  . Bad abdominal pain, especially in upper right area  . Decreased urine  .  Unusual thirst or passing urine often  . Rash that is not relieved by prescribed medicines  . Flu-like symptoms: fever, headache, muscle and joint aches, and fatigue (low energy, feeling weak)  . Signs of liver problems: dark urine, pale bowel movements, bad stomach pain, feeling very tired and weak, unusual itching, or yellowing of the eyes or skin  . Signs of infusion reactions such as fever or shaking chills, flushing, facial swelling, feeling dizzy, headache, trouble breathing, rash, itching, chest tightness, or chest pain.   Reproduction Warnings  . Pregnancy warning: This drug may have harmful effects on the unborn baby. Women of childbearing potential should use effective methods of birth control during your cancer treatment and for at least 4 months after treatment. Let your doctor know right away if you think you may be pregnant  . Breast feeding warning: It is not known if this drug passes into breast milk. It is recommended that women do not breastfeed during treatment and for 4 months after treatment.  . Fertility warning: Human fertility studies have not been done with this drug. Talk with your doctor or nurse if you plan to have children.   SELF CARE ACTIVITIES WHILE RECEIVING CHEMOTHERAPY:  Hydration Increase your fluid intake 48 hours prior to treatment and drink at least 8 to 12 cups (64 ounces) of water/decaffeinated beverages per day after treatment. You can still have your cup of coffee or soda but these beverages do not count as part of your 8 to 12 cups that you need to drink daily. No alcohol intake.  Medications Continue taking your normal prescription medication as prescribed.  If you start any new herbal or new supplements please let us know first to make sure it is safe.  Mouth Care Have teeth cleaned professionally before starting treatment. Keep dentures and partial plates clean. Use soft toothbrush and do not use mouthwashes that contain alcohol. Biotene is  a good mouthwash that is available at most pharmacies or may be ordered by calling [phone]. Use warm salt water gargles (1 teaspoon salt per 1 quart warm water) before and after meals and at bedtime. If you need dental work, please let the doctor know before you go for your appointment so that we can coordinate the best possible time for you in regards to your chemo regimen. You need to also let your dentist know that you are actively taking chemo. We may need to do labs prior to your dental appointment.  Skin Care Always use sunscreen that has not expired and with SPF (Sun Protection Factor) of 50 or higher. Wear hats to protect your head from the sun. Remember to use sunscreen on your hands, ears, face, & feet.  Use good moisturizing lotions such as udder cream, eucerin, or even Vaseline. Some chemotherapies can cause dry skin, color changes in your skin and nails.    [name] [employer]  Avoid long, hot showers or baths. . Use gentle, fragrance-free soaps and laundry detergent. . Use moisturizers, preferably creams or ointments rather than lotions because the thicker consistency is better at preventing skin dehydration. Apply the cream or ointment within 15 minutes of showering. Reapply moisturizer at night, and moisturize your hands every time after you wash them.  Hair Loss (if your doctor says your hair will fall out)  . If your doctor says that your hair is likely to fall out, decide before you begin chemo whether you want to wear a wig. You may want to shop before treatment to match your hair color. . Hats, turbans, and scarves can also camouflage hair loss, although some people prefer to leave their heads uncovered. If you go bare-headed outdoors, be sure to use sunscreen on your scalp. . Cut your hair short. It eases the inconvenience of shedding lots of hair, but it also can reduce the emotional impact of watching your hair fall out. . Don't perm or color your hair during chemotherapy. Those chemical  treatments are already damaging to hair and can enhance hair loss. Once your chemo treatments are done and your hair has grown back, it's OK to resume dyeing or perming hair.  With chemotherapy, hair loss is almost always temporary. But when it grows back, it may be a different color or texture. In older adults who still had hair color before chemotherapy, the new growth may be completely gray.  Often, new hair is very fine and soft.  Infection Prevention Please wash your hands for at least 30 seconds using warm soapy water. Handwashing is the #1 way to prevent the spread of germs. Stay away from sick people or people who are getting over a cold. If you develop respiratory systems such as green/yellow mucus production or productive cough or persistent cough let us know and we will see if you need an antibiotic. It is a good idea to keep a pair of gloves on when going into grocery stores/[employer] to decrease your risk of coming into contact with germs on the carts, etc. Carry alcohol hand gel with you at all times and use it frequently if out in public. If your temperature reaches 100.5 or higher please call the clinic and let us know.  If it is after hours or on the weekend please go to the ER if your temperature is over 100.5.  Please have your own personal thermometer at home to use.    Sex and bodily fluids If you are going to have sex, a condom must be used to protect the person that isn't taking chemotherapy. Chemo can decrease your libido (sex drive). For a few days after chemotherapy, chemotherapy can be excreted through your bodily fluids.  When using the toilet please close the lid and flush the toilet twice.  Do this for a few day after you have had chemotherapy.   Effects of chemotherapy on your sex life Some changes are simple and won't last long. They won't affect your sex life permanently.  Sometimes you may feel: . too tired . not strong enough to be very active . sick or sore  . not  in the mood . anxious or low Your anxiety might not seem related to sex. For example, you may be worried about the cancer and how your treatment is going. Or you may be worried about money, or about how you family are coping with your illness. These things can cause stress, which can affect your  interest in sex. It's important to talk to your partner about how you feel. Remember - the changes to your sex life don't usually last long. There's usually no medical reason to stop having sex during chemo. The drugs won't have any long term physical effects on your performance or enjoyment of sex. Cancer can't be passed on to your partner during sex  Contraception It's important to use reliable contraception during treatment. Avoid getting pregnant while you or your partner are having chemotherapy. This is because the drugs may harm the baby. Sometimes chemotherapy drugs can leave a man or woman infertile.  This means you would not be able to have children in the future. You might want to talk to someone about permanent infertility. It can be very difficult to learn that you may no longer be able to have children. Some people find counselling helpful. There might be ways to preserve your fertility, although this is easier for men than for women. You may want to speak to a fertility expert. You can talk about sperm banking or harvesting your eggs. You can also ask about other fertility options, such as donor eggs. If you have or have had breast cancer, your doctor might advise you not to take the contraceptive pill. This is because the hormones in it might affect the cancer.  It is not known for sure whether or not chemotherapy drugs can be passed on through semen or secretions from the vagina. Because of this some doctors advise people to use a barrier method if you have sex during treatment. This applies to vaginal, anal or oral sex. Generally, doctors advise a barrier method only for the time you are actually  having the treatment and for about a week after your treatment. Advice like this can be worrying, but this does not mean that you have to avoid being intimate with your partner. You can still have close contact with your partner and continue to enjoy sex.  Animals If you have cats or birds we just ask that you not change the litter or change the cage.  Please have someone else do this for you while you are on chemotherapy.   Food Safety During and After Cancer Treatment Food safety is important for people both during and after cancer treatment. Cancer and cancer treatments, such as chemotherapy, radiation therapy, and stem cell/bone marrow transplantation, often weaken the immune system. This makes it harder for your body to protect itself from foodborne illness, also called food poisoning. Foodborne illness is caused by eating food that contains harmful bacteria, parasites, or viruses.  Foods to avoid Some foods have a higher risk of becoming tainted with bacteria. These include: [name] Kitchen Unwashed fresh fruit and vegetables, especially leafy vegetables that can hide dirt and other contaminants . Raw sprouts, such as alfalfa sprouts . Raw or undercooked beef, especially ground beef, or other raw or undercooked meat and poultry . Fatty, fried, or spicy foods immediately before or after treatment.  These can sit heavy on your stomach and make you feel nauseous. . Raw or undercooked shellfish, such as oysters. . Sushi and sashimi, which often contain raw fish.  . Unpasteurized beverages, such as unpasteurized fruit juices, raw milk, raw yogurt, or cider . Undercooked eggs, such as soft boiled, over easy, and poached; raw, unpasteurized eggs; or foods made with raw egg, such as homemade raw cookie dough and homemade mayonnaise  Simple steps for food safety  Shop smart. . Do not buy food stored or displayed  in an unclean area. . Do not buy bruised or damaged fruits or vegetables. . Do not buy cans that  have cracks, dents, or bulges. . Pick up foods that can spoil at the end of your shopping trip and store them in a cooler on the way home.  Prepare and clean up foods carefully. . Rinse all fresh fruits and vegetables under running water, and dry them with a clean towel or paper towel. . Clean the top of cans before opening them. . After preparing food, wash your hands for 20 seconds with hot water and soap. Pay special attention to areas between fingers and under nails. . Clean your utensils and dishes with hot water and soap. [name] Kitchen Disinfect your kitchen and cutting boards using 1 teaspoon of liquid, unscented bleach mixed into 1 quart of water.    Dispose of old food. . Eat canned and packaged food before its expiration date (the "use by" or "best before" date). . Consume refrigerated leftovers within 3 to 4 days. After that time, throw out the food. Even if the food does not smell or look spoiled, it still may be unsafe. Some bacteria, such as Listeria, can grow even on foods stored in the refrigerator if they are kept for too long.  Take precautions when eating out. . At restaurants, avoid buffets and salad bars where food sits out for a long time and comes in contact with many people. Food can become contaminated when someone with a virus, often a norovirus, or another "bug" handles it. . Put any leftover food in a "to-go" container yourself, rather than having the server do it. And, refrigerate leftovers as soon as you get home. . Choose restaurants that are clean and that are willing to prepare your food as you order it cooked.   AT HOME MEDICATIONS:                                                                                                                                                                Compazine/Prochlorperazine 10mg  tablet. Take 1 tablet every 6 hours as needed for nausea/vomiting. (This can make you sleepy)   EMLA cream. Apply a quarter size amount to port site 1  hour prior to chemo. Do not rub in. Cover with plastic wrap.    Diarrhea Sheet   If you are having loose stools/diarrhea, please purchase Imodium and begin taking as outlined:  At the first sign of poorly formed or loose stools you should begin taking Imodium (loperamide) 2 mg capsules.  Take two tablets (4mg ) followed by one tablet (2mg ) every 2 hours - DO NOT EXCEED 8 tablets in 24 hours.  If it is bedtime and you are having loose stools, take 2 tablets at bedtime, then 2 tablets every  4 hours until morning.   Always call the [facility] if you are having loose stools/diarrhea that you can't get under control.  Loose stools/diarrhea leads to dehydration (loss of water) in your body.  We have other options of trying to get the loose stools/diarrhea to stop but you must let us know!   Constipation Sheet  Colace - 100 mg capsules - take 2 capsules daily.  If this doesn't help then you can increase to 2 capsules twice daily.  Please call if the above does not work for you. Do not go more than 2 days without a bowel movement.  It is very important that you do not become constipated.  It will make you feel sick to your stomach (nausea) and can cause abdominal pain and vomiting.  Nausea Sheet   Compazine/Prochlorperazine 10mg  tablet. Take 1 tablet every 6 hours as needed for nausea/vomiting (This can make you drowsy).  If you are having persistent nausea (nausea that does not stop) please call the [facility] and let us know the amount of nausea that you are experiencing.  If you begin to vomit, you need to call the [facility] and if it is the weekend and you have vomited more than one time and can't get it to stop, go to the Emergency Room.  Persistent nausea/vomiting can lead to dehydration (loss of fluid in your body) and will make you feel very weak and unwell. Ice chips, sips of clear liquids, foods that are at room temperature, crackers, and toast tend to be better tolerated.   SYMPTOMS  TO REPORT AS SOON AS POSSIBLE AFTER TREATMENT:  FEVER GREATER THAN 100.5 F  CHILLS WITH OR WITHOUT FEVER  NAUSEA AND VOMITING THAT IS NOT CONTROLLED WITH YOUR NAUSEA MEDICATION  UNUSUAL SHORTNESS OF BREATH  UNUSUAL BRUISING OR BLEEDING  TENDERNESS IN MOUTH AND THROAT WITH OR WITHOUT   PRESENCE OF ULCERS  URINARY PROBLEMS  BOWEL PROBLEMS  UNUSUAL RASH    Wear comfortable clothing and clothing appropriate for easy access to any Portacath or PICC line. Let us know if there is anything that we can do to make your therapy better!   What to do if you need assistance after hours or on the weekends: CALL [phone].  HOLD on the line, do not hang up.  You will hear multiple messages but at the end you will be connected with a nurse triage line.  They will contact the doctor if necessary.  Most of the time they will be able to assist you.  Do not call the hospital operator.     I have been informed and understand all of the instructions given to me and have received a copy. I have been instructed to call the clinic [phone] or my family physician as soon as possible for continued medical care, if indicated. I do not have any more questions at this time but understand that I may call the [facility] or the Patient Navigator at [phone] during office hours should I have questions or need assistance in obtaining follow-up care.

## 2019-06-21 NOTE — Telephone Encounter (Addendum)
  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  Interval Since Last Radiation: 1 week  05/30/2019-06/13/2018: The right lung target including T3,  lumbar spine, right acetabulum and right pubic ramus were all treated to 30 Gy in 10 fractions.  05/04/2019-05/19/2019: The whole brain was treated to 30 Gy in 10 fractions  Narrative:  The patient was contacted today for routine follow-up. During treatment he did very well with radiotherapy and did not have significant desquamation. He did have some difficulty with his steroid taper but is now doing well with it. He has some discomfort with swallowing though this is getting better. No other complaints were noted by his sister.  Impression/Plan: 1. 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 I spoke with [name] and he is doing well. He is to the last part of his taper taking 2mg  dexamethasone every other day. She is encouraged to let us know if he has any rebound symptoms. Otherwise we will plan repeat MRI in March for follow up and we will follow along with [name] as he proceeds with systemic therapy.    Carola Rhine, PAC

## 2019-06-21 NOTE — Telephone Encounter (Signed)
See prior note

## 2019-06-21 NOTE — Telephone Encounter (Signed)
[facility] Initial Psychosocial Assessment Clinical Social Work  Clinical Social Work contacted by phone to assess psychosocial, emotional, mental health, and spiritual needs of the patient.   Barriers to care/review of distress screen:  - Transportation:  Do you anticipate any problems getting to appointments?  Do you have someone who can help run errands for you if you need it?  Family is helping with transportation.  Patient can be referred for [employer] gas card program.   - Help at home:  What is your living situation (alone, family, other)?  If you are physically unable to care for yourself, who would you call on to help you?  Lives alone, struggling to care for himself at home.  Is only able to "lay in bed at home."  Very weak, is having difficulty taking care of himself on his own.  Family helping.   - Support system:  What does your support system look like?  Who would you call on if you needed some kind of practical help?  What if you needed someone to talk to for emotional support?  Has 3 sisters who are helping him take care of his needs, "they do what they can do."   - Finances:  Are you concerned about finances .  Considering returning to work?  If not, applying for disability?  Has multiple bills that are due.  Financially stressed.  Sister Garrel Ridgel helped him apply for Medicaid at [place].  Has applied for disability online.  Both applications were done approx one month ago.  Has not heard from [place] [facility] by mail or phone.  Gets Food Stamps - $200/month.  No concerns re food at this time.    What is your understanding of where you are with your cancer? Its cause?  Your treatment plan and what happens next?  Newly diagnosed with lung cancer.  Unable to work, weak and financially stressed.  Lives alone, has support from sisters.    CSW Summary:  Patient and family psychosocial functioning including strengths, limitations, and coping skills:  59 year old male newly  diagnosed with Stage IV lung cancer, metastatic to brain and bone.  Receiving chemotherapy and radiation.  Support from sisters.  Frustrated that he has not received disability or Medicaid to date, struggling with paying bills like utility and/or cell phone.  Wanted to focus on his primary needs for financial support.    Identifications of barriers to care:  Lives alone, uninsured, no income.    Availability of community resources: [place] workerois [employer] [phone]).  Left VM for worker.  Referred to [place] for small grant for utilities.  Can also refer to [place] gas card program.  Refer to [facility] for PCP and any other help available.    Clinical Social Worker follow up needed: Yes.    Will follow up by phone in two weeks to assess progress towards goals.  Left VM for DSS Medicaid worker, referred to Duanne L[place], submitted [employer] gas card referral.    Edwyna Shell, LCSW Clinical Social Worker Phone:  [phone]

## 2019-06-22 LAB — FUNGUS CULTURE RESULT

## 2019-06-22 LAB — FUNGUS CULTURE WITH STAIN

## 2019-06-22 LAB — FUNGAL ORGANISM REFLEX

## 2019-06-22 NOTE — Progress Notes (Signed)
Late entry: Oncology Navigator Note:  I met with patient during the visit with Dr. Katragadda. Following the visit, I provided written education material on the chemotherapy medications discussed with him by Dr. K.  I briefly went over them and explained that he will get a detailed chemo teach class.  He was given a tour of the chemotherapy area and I went over with him what to expect on his first day.  Him and his sister were given the opportunity to ask questions and all were answered to their satisfaction.    Patient was given B-12 1000 mcg injection per MD orders. See MAR for administration data.  Patient tolerated injection without incidence.  Patient was discharged via wheelchair and in stable condition from clinic.   

## 2019-06-23 ENCOUNTER — Inpatient Hospital Stay (HOSPITAL_COMMUNITY): Payer: Medicaid Other

## 2019-06-23 ENCOUNTER — Ambulatory Visit (INDEPENDENT_AMBULATORY_CARE_PROVIDER_SITE_OTHER): Payer: Self-pay | Admitting: General Surgery

## 2019-06-23 ENCOUNTER — Other Ambulatory Visit: Payer: Self-pay

## 2019-06-23 ENCOUNTER — Encounter (HOSPITAL_COMMUNITY): Payer: Self-pay | Admitting: General Practice

## 2019-06-23 ENCOUNTER — Encounter: Payer: Self-pay | Admitting: General Surgery

## 2019-06-23 VITALS — BP 91/59 | HR 101 | Temp 98.5°F | Resp 16 | Ht 72.0 in | Wt 140.0 lb

## 2019-06-23 DIAGNOSIS — C3411 Malignant neoplasm of upper lobe, right bronchus or lung: Secondary | ICD-10-CM

## 2019-06-23 DIAGNOSIS — Z95828 Presence of other vascular implants and grafts: Secondary | ICD-10-CM

## 2019-06-23 NOTE — Patient Instructions (Signed)

## 2019-06-23 NOTE — Progress Notes (Signed)
[facility] CSW Progress Notes  Email from Nicanor Bake, [place].  They will provide financial help w power bill and add to their meal/food box distribution list.  Edwyna Shell, LCSW Clinical Social Worker Phone:  [phone]

## 2019-06-23 NOTE — Progress Notes (Signed)
Chemotherapy/immunotherapy education packet given and discussed with pt and family in detail.  Discussed diagnosis and staging, tx regimen, and intent of tx.  Reviewed chemotherapy/immunotherapy  medications and side effects, as well as pre-medications.  Instructed on how to manage side effects at home, and when to call the clinic.  Importance of fever/chills discussed with pt and family. Discussed precautions to implement at home after receiving tx, as well as self care strategies. Phone numbers provided for clinic during regular working hours, and also how to reach the clinic after hours and on weekends. Pt and family provided the opportunity to ask questions - all questions answered to pt's and family satisfaction.

## 2019-06-24 NOTE — H&P (Signed)
Brandon Ferguson; [id number]; April 07, 1961   HPI Patient is a 59 year old black male who was referred to my care by oncology for Port-A-Cath insertion.  He is about to undergo chemotherapy for metastatic right lung carcinoma.  He currently has 0 out of 10 pain. Past Medical History:  Diagnosis Date  . Iron deficiency   . Port-A-Cath in place 06/17/2019  . Vitamin D deficiency     Past Surgical History:  Procedure Laterality Date  . KNEE SURGERY    . VIDEO BRONCHOSCOPY Bilateral 04/29/2019   Procedure: VIDEO BRONCHOSCOPY WITH FLUORO;  Surgeon: Laurin Coder, MD;  Location: [facility] END[facility];  Service: Endoscopy;  Laterality: Bilateral;  . VIDEO BRONCHOSCOPY Bilateral 05/03/2019   Procedure: VIDEO BRONCHOSCOPY WITHOUT FLUORO;  Surgeon: Rigoberto Noel, MD;  Location: [facility];  Service: Cardiopulmonary;  Laterality: Bilateral;    Family History  Problem Relation Age of Onset  . Breast cancer Mother   . Cirrhosis Father   . Alcohol abuse Father   . Hypertension Sister   . Cancer Brother   . Hypertension Sister   . Hypertension Sister   . Hypertension Sister   . Hypertension Sister     Current Outpatient Medications on File Prior to Visit  Medication Sig Dispense Refill  . acetaminophen (TYLENOL) 500 MG tablet Take 500 mg by mouth every 6 (six) hours as needed for headache.    . dexamethasone (DECADRON) 4 MG tablet Take 1 tablet (4 mg total) by mouth 2 (two) times daily. 30 tablet 0  . ferrous sulfate 325 (65 FE) MG EC tablet Take 325 mg by mouth daily.    . folic acid (FOLVITE) 1 MG tablet Take 1 tablet (1 mg total) by mouth daily. 30 tablet 3  . levETIRAcetam (KEPPRA) 500 MG tablet Take 1 tablet (500 mg total) by mouth 2 (two) times daily. 60 tablet 0  . naproxen sodium (ALEVE) 220 MG tablet Take 220 mg by mouth 2 (two) times daily as needed (for headache).    . pantoprazole (PROTONIX) 40 MG tablet Take 1 tablet (40 mg total) by mouth daily. 30 tablet 0  . polyethylene glycol  (MIRALAX / GLYCOLAX) 17 g packet Take 17 g by mouth daily as needed. 14 each 0  . thiamine 100 MG tablet Take 1 tablet (100 mg total) by mouth daily. 30 tablet 0   No current facility-administered medications on file prior to visit.    No Known Allergies  Social History   Substance and Sexual Activity  Alcohol Use Yes  . Alcohol/week: 6.0 standard drinks  . Types: 6 Cans of beer per week   Comment: pt drinks 3 large beers daily    Social History   Tobacco Use  Smoking Status Current Every Day Smoker  . Packs/day: 1.00  . Types: Cigarettes  Smokeless Tobacco Never Used    Review of Systems  Constitutional: Positive for malaise/fatigue.  HENT: Positive for ear pain and sore throat.   Eyes: Positive for blurred vision and double vision.  Respiratory: Negative.   Cardiovascular: Negative.   Gastrointestinal: Negative.   Genitourinary: Negative.   Musculoskeletal: Positive for back pain, joint pain and neck pain.  Skin: Negative.   Neurological: Positive for tremors.  Endo/Heme/Allergies: Negative.   Psychiatric/Behavioral: Negative.     Objective   Vitals:   06/23/19 1110  BP: (!) 91/59  Pulse: (!) 101  Resp: 16  Temp: 98.5 F (36.9 C)  SpO2: 95%    Physical Exam Vitals reviewed.  Constitutional:  Appearance: Normal appearance. He is not ill-appearing.  HENT:     Head: Normocephalic and atraumatic.  Cardiovascular:     Rate and Rhythm: Normal rate and regular rhythm.     Heart sounds: Normal heart sounds. No murmur. No friction rub. No gallop.   Pulmonary:     Effort: Pulmonary effort is normal. No respiratory distress.     Breath sounds: Normal breath sounds. No stridor. No wheezing, rhonchi or rales.  Skin:    General: Skin is warm and dry.  Neurological:     Mental Status: He is alert and oriented to person, place, and time.   Oncology notes reviewed  Assessment  Metastatic right lung carcinoma Plan   Patient is scheduled for Port-A-Cath  insertion on 06/29/2019.  The risks and benefits of the procedure including bleeding, infection, and pneumothorax were fully explained to the patient, who gave informed consent.

## 2019-06-24 NOTE — Patient Instructions (Signed)
[facility]  06/24/2019     @PREFPERIOPPHARMACY @   Your procedure is scheduled on  06/29/2019.  Report to [facility] at  0700  A.M.  Call this number if you have problems the morning of surgery:  [phone]   Remember:  Do not eat or drink after midnight.                         Take these medicines the morning of surgery with A SIP OF WATER  Dexamethasone, keppra, protonix, compazine(if needed).    Do not wear jewelry, make-up or nail polish.  Do not wear lotions, powders, or perfumes, or deodorant. Please brush your teeth.  Do not shave 48 hours prior to surgery.  Men may shave face and neck.  Do not bring valuables to the [facility].  [facility] is not responsible for any belongings or valuables.  Contacts, dentures or bridgework may not be worn into surgery.  Leave your suitcase in the car.  After surgery it may be brought to your room.  For patients admitted to the [facility], discharge time will be determined by your treatment team.  Patients discharged the day of surgery will not be allowed to drive home.   Name and phone number of your driver:   family Special instructions:  None  Please read over the following fact sheets that you were given. Anesthesia Post-op Instructions and Care and Recovery After Surgery       Implanted [facility] Guide An implanted port is a device that is placed under the skin. It is usually placed in the chest. The device can be used to give IV medicine, to take blood, or for dialysis. You may have an implanted port if:  You need IV medicine that would be irritating to the small veins in your hands or arms.  You need IV medicines, such as antibiotics, for a long period of time.  You need IV nutrition for a long period of time.  You need dialysis. Having a port means that your health care provider will not need to use the veins in your arms for these procedures. You may have fewer limitations when using a port than  you would if you used other types of long-term IVs, and you will likely be able to return to normal activities after your incision heals. An implanted port has two main parts:  Reservoir. The reservoir is the part where a needle is inserted to give medicines or draw blood. The reservoir is round. After it is placed, it appears as a small, raised area under your skin.  Catheter. The catheter is a thin, flexible tube that connects the reservoir to a vein. Medicine that is inserted into the reservoir goes into the catheter and then into the vein. How is my port accessed? To access your port:  A numbing cream may be placed on the skin over the port site.  Your health care provider will put on a mask and sterile gloves.  The skin over your port will be cleaned carefully with a germ-killing soap and allowed to dry.  Your health care provider will gently pinch the port and insert a needle into it.  Your health care provider will check for a blood return to make sure the port is in the vein and is not clogged.  If your port needs to remain accessed to get medicine continuously (constant infusion), your  health care provider will place a clear bandage (dressing) over the needle site. The dressing and needle will need to be changed every week, or as told by your health care provider. What is flushing? Flushing helps keep the port from getting clogged. Follow instructions from your health care provider about how and when to flush the port. Ports are usually flushed with saline solution or a medicine called heparin. The need for flushing will depend on how the port is used:  If the port is only used from time to time to give medicines or draw blood, the port may need to be flushed: ? Before and after medicines have been given. ? Before and after blood has been drawn. ? As part of routine maintenance. Flushing may be recommended every 4-6 weeks.  If a constant infusion is running, the port may not need  to be flushed.  Throw away any syringes in a disposal container that is meant for sharp items (sharps container). You can buy a sharps container from a pharmacy, or you can make one by using an empty hard plastic bottle with a cover. How long will my port stay implanted? The port can stay in for as long as your health care provider thinks it is needed. When it is time for the port to come out, a surgery will be done to remove it. The surgery will be similar to the procedure that was done to put the port in. Follow these instructions at home:   Flush your port as told by your health care provider.  If you need an infusion over several days, follow instructions from your health care provider about how to take care of your port site. Make sure you: ? Wash your hands with soap and water before you change your dressing. If soap and water are not available, use alcohol-based hand sanitizer. ? Change your dressing as told by your health care provider. ? Place any used dressings or infusion bags into a plastic bag. Throw that bag in the trash. ? Keep the dressing that covers the needle clean and dry. Do not get it wet. ? Do not use scissors or sharp objects near the tube. ? Keep the tube clamped, unless it is being used.  Check your port site every day for signs of infection. Check for: ? Redness, swelling, or pain. ? Fluid or blood. ? Pus or a bad smell.  Protect the skin around the port site. ? Avoid wearing bra straps that rub or irritate the site. ? Protect the skin around your port from seat belts. Place a soft pad over your chest if needed.  Bathe or shower as told by your health care provider. The site may get wet as long as you are not actively receiving an infusion.  Return to your normal activities as told by your health care provider. Ask your health care provider what activities are safe for you.  Carry a medical alert card or wear a medical alert bracelet at all times. This will  let health care providers know that you have an implanted port in case of an emergency. Get help right away if:  You have redness, swelling, or pain at the port site.  You have fluid or blood coming from your port site.  You have pus or a bad smell coming from the port site.  You have a fever. Summary  Implanted ports are usually placed in the chest for long-term IV access.  Follow instructions from your health  care provider about flushing the port and changing bandages (dressings).  Take care of the area around your port by avoiding clothing that puts pressure on the area, and by watching for signs of infection.  Protect the skin around your port from seat belts. Place a soft pad over your chest if needed.  Get help right away if you have a fever or you have redness, swelling, pain, drainage, or a bad smell at the port site. This information is not intended to replace advice given to you by your health care provider. Make sure you discuss any questions you have with your health care provider. Document Revised: 09/17/2018 Document Reviewed: 06/28/2016 Elsevier Patient Education  [place] Insertion, Care After This sheet gives you information about how to care for yourself after your procedure. Your health care provider may also give you more specific instructions. If you have problems or questions, contact your health care provider. What can I expect after the procedure? After the procedure, it is common to have:  Discomfort at the port insertion site.  Bruising on the skin over the port. This should improve over 3-4 days. Follow these instructions at home: [facility] care  After your port is placed, you will get a manufacturer's information card. The card has information about your port. Keep this card with you at all times.  Take care of the port as told by your health care provider. Ask your health care provider if you or a family member can get training  for taking care of the port at home. A home health care nurse may also take care of the port.  Make sure to remember what type of port you have. Incision care      Follow instructions from your health care provider about how to take care of your port insertion site. Make sure you: ? Wash your hands with soap and water before and after you change your bandage (dressing). If soap and water are not available, use hand sanitizer. ? Change your dressing as told by your health care provider. ? Leave stitches (sutures), skin glue, or adhesive strips in place. These skin closures may need to stay in place for 2 weeks or longer. If adhesive strip edges start to loosen and curl up, you may trim the loose edges. Do not remove adhesive strips completely unless your health care provider tells you to do that.  Check your port insertion site every day for signs of infection. Check for: ? Redness, swelling, or pain. ? Fluid or blood. ? Warmth. ? Pus or a bad smell. Activity  Return to your normal activities as told by your health care provider. Ask your health care provider what activities are safe for you.  Do not lift anything that is heavier than 10 lb (4.5 kg), or the limit that you are told, until your health care provider says that it is safe. General instructions  Take over-the-counter and prescription medicines only as told by your health care provider.  Do not take baths, swim, or use a hot tub until your health care provider approves. Ask your health care provider if you may take showers. You may only be allowed to take sponge baths.  Do not drive for 24 hours if you were given a sedative during your procedure.  Wear a medical alert bracelet in case of an emergency. This will tell any health care providers that you have a port.  Keep all follow-up visits as told by your  health care provider. This is important. Contact a health care provider if:  You cannot flush your port with saline as  directed, or you cannot draw blood from the port.  You have a fever or chills.  You have redness, swelling, or pain around your port insertion site.  You have fluid or blood coming from your port insertion site.  Your port insertion site feels warm to the touch.  You have pus or a bad smell coming from the port insertion site. Get help right away if:  You have chest pain or shortness of breath.  You have bleeding from your port that you cannot control. Summary  Take care of the port as told by your health care provider. Keep the manufacturer's information card with you at all times.  Change your dressing as told by your health care provider.  Contact a health care provider if you have a fever or chills or if you have redness, swelling, or pain around your port insertion site.  Keep all follow-up visits as told by your health care provider. This information is not intended to replace advice given to you by your health care provider. Make sure you discuss any questions you have with your health care provider. Document Revised: 12/22/2017 Document Reviewed: 12/22/2017 Elsevier Patient Education  [place] After These instructions provide you with information about caring for yourself after your procedure. Your health care provider may also give you more specific instructions. Your treatment has been planned according to current medical practices, but problems sometimes occur. Call your health care provider if you have any problems or questions after your procedure. What can I expect after the procedure? After your procedure, you may:  Feel sleepy for several hours.  Feel clumsy and have poor balance for several hours.  Feel forgetful about what happened after the procedure.  Have poor judgment for several hours.  Feel nauseous or vomit.  Have a sore throat if you had a breathing tube during the procedure. Follow these instructions  at home: For at least 24 hours after the procedure:      Have a responsible adult stay with you. It is important to have someone help care for you until you are awake and alert.  Rest as needed.  Do not: ? Participate in activities in which you could fall or become injured. ? Drive. ? Use heavy machinery. ? Drink alcohol. ? Take sleeping pills or medicines that cause drowsiness. ? Make important decisions or sign legal documents. ? Take care of children on your own. Eating and drinking  Follow the diet that is recommended by your health care provider.  If you vomit, drink water, juice, or soup when you can drink without vomiting.  Make sure you have little or no nausea before eating solid foods. General instructions  Take over-the-counter and prescription medicines only as told by your health care provider.  If you have sleep apnea, surgery and certain medicines can increase your risk for breathing problems. Follow instructions from your health care provider about wearing your sleep device: ? Anytime you are sleeping, including during daytime naps. ? While taking prescription pain medicines, sleeping medicines, or medicines that make you drowsy.  If you smoke, do not smoke without supervision.  Keep all follow-up visits as told by your health care provider. This is important. Contact a health care provider if:  You keep feeling nauseous or you keep vomiting.  You feel light-headed.  You develop  a rash.  You have a fever. Get help right away if:  You have trouble breathing. Summary  For several hours after your procedure, you may feel sleepy and have poor judgment.  Have a responsible adult stay with you for at least 24 hours or until you are awake and alert. This information is not intended to replace advice given to you by your health care provider. Make sure you discuss any questions you have with your health care provider. Document Revised: 08/24/2017 Document  Reviewed: 09/16/2015 Elsevier Patient Education  [place]. How to Use Chlorhexidine for Bathing Chlorhexidine gluconate (CHG) is a germ-killing (antiseptic) solution that is used to clean the skin. It can get rid of the bacteria that normally live on the skin and can keep them away for about 24 hours. To clean your skin with CHG, you may be given:  A CHG solution to use in the shower or as part of a sponge bath.  A prepackaged cloth that contains CHG. Cleaning your skin with CHG may help lower the risk for infection:  While you are staying in the intensive care unit of the [facility].  If you have a vascular access, such as a central line, to provide short-term or long-term access to your veins.  If you have a catheter to drain urine from your bladder.  If you are on a ventilator. A ventilator is a machine that helps you breathe by moving air in and out of your lungs.  After surgery. What are the risks? Risks of using CHG include:  A skin reaction.  Hearing loss, if CHG gets in your ears.  Eye injury, if CHG gets in your eyes and is not rinsed out.  The CHG product catching fire. Make sure that you avoid smoking and flames after applying CHG to your skin. Do not use CHG:  If you have a chlorhexidine allergy or have previously reacted to chlorhexidine.  On babies younger than 39 months of age. How to use CHG solution  Use CHG only as told by your health care provider, and follow the instructions on the label.  Use the full amount of CHG as directed. Usually, this is one bottle. During a shower Follow these steps when using CHG solution during a shower (unless your health care provider gives you different instructions): 1. Start the shower. 2. Use your normal soap and shampoo to wash your face and hair. 3. Turn off the shower or move out of the shower stream. 4. Pour the CHG onto a clean washcloth. Do not use any type of brush or rough-edged sponge. 5. Starting at  your neck, lather your body down to your toes. Make sure you follow these instructions: ? If you will be having surgery, pay special attention to the part of your body where you will be having surgery. Scrub this area for at least 1 minute. ? Do not use CHG on your head or face. If the solution gets into your ears or eyes, rinse them well with water. ? Avoid your genital area. ? Avoid any areas of skin that have broken skin, cuts, or scrapes. ? Scrub your back and under your arms. Make sure to wash skin folds. 6. Let the lather sit on your skin for 1-2 minutes or as long as told by your health care provider. 7. Thoroughly rinse your entire body in the shower. Make sure that all body creases and crevices are rinsed well. 8. Dry off with a clean towel. Do  not put any substances on your body afterward--such as powder, lotion, or perfume--unless you are told to do so by your health care provider. Only use lotions that are recommended by the manufacturer. 9. Put on clean clothes or pajamas. 10. If it is the night before your surgery, sleep in clean sheets.  During a sponge bath Follow these steps when using CHG solution during a sponge bath (unless your health care provider gives you different instructions): 1. Use your normal soap and shampoo to wash your face and hair. 2. Pour the CHG onto a clean washcloth. 3. Starting at your neck, lather your body down to your toes. Make sure you follow these instructions: ? If you will be having surgery, pay special attention to the part of your body where you will be having surgery. Scrub this area for at least 1 minute. ? Do not use CHG on your head or face. If the solution gets into your ears or eyes, rinse them well with water. ? Avoid your genital area. ? Avoid any areas of skin that have broken skin, cuts, or scrapes. ? Scrub your back and under your arms. Make sure to wash skin folds. 4. Let the lather sit on your skin for 1-2 minutes or as long as told  by your health care provider. 5. Using a different clean, wet washcloth, thoroughly rinse your entire body. Make sure that all body creases and crevices are rinsed well. 6. Dry off with a clean towel. Do not put any substances on your body afterward--such as powder, lotion, or perfume--unless you are told to do so by your health care provider. Only use lotions that are recommended by the manufacturer. 7. Put on clean clothes or pajamas. 8. If it is the night before your surgery, sleep in clean sheets. How to use CHG prepackaged cloths  Only use CHG cloths as told by your health care provider, and follow the instructions on the label.  Use the CHG cloth on clean, dry skin.  Do not use the CHG cloth on your head or face unless your health care provider tells you to.  When washing with the CHG cloth: ? Avoid your genital area. ? Avoid any areas of skin that have broken skin, cuts, or scrapes. Before surgery Follow these steps when using a CHG cloth to clean before surgery (unless your health care provider gives you different instructions): 1. Using the CHG cloth, vigorously scrub the part of your body where you will be having surgery. Scrub using a back-and-forth motion for 3 minutes. The area on your body should be completely wet with CHG when you are done scrubbing. 2. Do not rinse. Discard the cloth and let the area air-dry. Do not put any substances on the area afterward, such as powder, lotion, or perfume. 3. Put on clean clothes or pajamas. 4. If it is the night before your surgery, sleep in clean sheets.  For general bathing Follow these steps when using CHG cloths for general bathing (unless your health care provider gives you different instructions). 1. Use a separate CHG cloth for each area of your body. Make sure you wash between any folds of skin and between your fingers and toes. Wash your body in the following order, switching to a new cloth after each step: ? The front of your  neck, shoulders, and chest. ? Both of your arms, under your arms, and your hands. ? Your stomach and groin area, avoiding the genitals. ? Your right leg and  foot. ? Your left leg and foot. ? The back of your neck, your back, and your buttocks. 2. Do not rinse. Discard the cloth and let the area air-dry. Do not put any substances on your body afterward--such as powder, lotion, or perfume--unless you are told to do so by your health care provider. Only use lotions that are recommended by the manufacturer. 3. Put on clean clothes or pajamas. Contact a health care provider if:  Your skin gets irritated after scrubbing.  You have questions about using your solution or cloth. Get help right away if:  Your eyes become very red or swollen.  Your eyes itch badly.  Your skin itches badly and is red or swollen.  Your hearing changes.  You have trouble seeing.  You have swelling or tingling in your mouth or throat.  You have trouble breathing.  You swallow any chlorhexidine. Summary  Chlorhexidine gluconate (CHG) is a germ-killing (antiseptic) solution that is used to clean the skin. Cleaning your skin with CHG may help to lower your risk for infection.  You may be given CHG to use for bathing. It may be in a bottle or in a prepackaged cloth to use on your skin. Carefully follow your health care provider's instructions and the instructions on the product label.  Do not use CHG if you have a chlorhexidine allergy.  Contact your health care provider if your skin gets irritated after scrubbing. This information is not intended to replace advice given to you by your health care provider. Make sure you discuss any questions you have with your health care provider. Document Revised: 08/12/2018 Document Reviewed: 04/23/2017 Elsevier Patient Education  [place].

## 2019-06-24 NOTE — Progress Notes (Signed)
Brandon Ferguson; [id number]; 1961/06/04   HPI Patient is a 59 year old black male who was referred to my care by oncology for Port-A-Cath insertion.  He is about to undergo chemotherapy for metastatic right lung carcinoma.  He currently has 0 out of 10 pain. Past Medical History:  Diagnosis Date  . Iron deficiency   . Port-A-Cath in place 06/17/2019  . Vitamin D deficiency     Past Surgical History:  Procedure Laterality Date  . KNEE SURGERY    . VIDEO BRONCHOSCOPY Bilateral 04/29/2019   Procedure: VIDEO BRONCHOSCOPY WITH FLUORO;  Surgeon: Laurin Coder, MD;  Location: [facility];  Service: Endoscopy;  Laterality: Bilateral;  . VIDEO BRONCHOSCOPY Bilateral 05/03/2019   Procedure: VIDEO BRONCHOSCOPY WITHOUT FLUORO;  Surgeon: Rigoberto Noel, MD;  Location: [facility];  Service: Cardiopulmonary;  Laterality: Bilateral;    Family History  Problem Relation Age of Onset  . Breast cancer Mother   . Cirrhosis Father   . Alcohol abuse Father   . Hypertension Sister   . Cancer Brother   . Hypertension Sister   . Hypertension Sister   . Hypertension Sister   . Hypertension Sister     Current Outpatient Medications on File Prior to Visit  Medication Sig Dispense Refill  . acetaminophen (TYLENOL) 500 MG tablet Take 500 mg by mouth every 6 (six) hours as needed for headache.    . dexamethasone (DECADRON) 4 MG tablet Take 1 tablet (4 mg total) by mouth 2 (two) times daily. 30 tablet 0  . ferrous sulfate 325 (65 FE) MG EC tablet Take 325 mg by mouth daily.    . folic acid (FOLVITE) 1 MG tablet Take 1 tablet (1 mg total) by mouth daily. 30 tablet 3  . levETIRAcetam (KEPPRA) 500 MG tablet Take 1 tablet (500 mg total) by mouth 2 (two) times daily. 60 tablet 0  . naproxen sodium (ALEVE) 220 MG tablet Take 220 mg by mouth 2 (two) times daily as needed (for headache).    . pantoprazole (PROTONIX) 40 MG tablet Take 1 tablet (40 mg total) by mouth daily. 30 tablet 0  . polyethylene glycol  (MIRALAX / GLYCOLAX) 17 g packet Take 17 g by mouth daily as needed. 14 each 0  . thiamine 100 MG tablet Take 1 tablet (100 mg total) by mouth daily. 30 tablet 0   No current facility-administered medications on file prior to visit.    No Known Allergies  Social History   Substance and Sexual Activity  Alcohol Use Yes  . Alcohol/week: 6.0 standard drinks  . Types: 6 Cans of beer per week   Comment: pt drinks 3 large beers daily    Social History   Tobacco Use  Smoking Status Current Every Day Smoker  . Packs/day: 1.00  . Types: Cigarettes  Smokeless Tobacco Never Used    Review of Systems  Constitutional: Positive for malaise/fatigue.  HENT: Positive for ear pain and sore throat.   Eyes: Positive for blurred vision and double vision.  Respiratory: Negative.   Cardiovascular: Negative.   Gastrointestinal: Negative.   Genitourinary: Negative.   Musculoskeletal: Positive for back pain, joint pain and neck pain.  Skin: Negative.   Neurological: Positive for tremors.  Endo/Heme/Allergies: Negative.   Psychiatric/Behavioral: Negative.     Objective   Vitals:   06/23/19 1110  BP: (!) 91/59  Pulse: (!) 101  Resp: 16  Temp: 98.5 F (36.9 C)  SpO2: 95%    Physical Exam Vitals reviewed.  Constitutional:  Appearance: Normal appearance. He is not ill-appearing.  HENT:     Head: Normocephalic and atraumatic.  Cardiovascular:     Rate and Rhythm: Normal rate and regular rhythm.     Heart sounds: Normal heart sounds. No murmur. No friction rub. No gallop.   Pulmonary:     Effort: Pulmonary effort is normal. No respiratory distress.     Breath sounds: Normal breath sounds. No stridor. No wheezing, rhonchi or rales.  Skin:    General: Skin is warm and dry.  Neurological:     Mental Status: He is alert and oriented to person, place, and time.   Oncology notes reviewed  Assessment  Metastatic right lung carcinoma Plan   Patient is scheduled for Port-A-Cath  insertion on 06/29/2019.  The risks and benefits of the procedure including bleeding, infection, and pneumothorax were fully explained to the patient, who gave informed consent.

## 2019-06-27 ENCOUNTER — Other Ambulatory Visit (HOSPITAL_COMMUNITY)
Admission: RE | Admit: 2019-06-27 | Discharge: 2019-06-27 | Disposition: A | Discharge status: Home / Self Care | Payer: Medicaid Other | Source: Ambulatory Visit | Attending: General Surgery | Admitting: General Surgery

## 2019-06-27 ENCOUNTER — Encounter (HOSPITAL_COMMUNITY): Payer: Self-pay

## 2019-06-27 ENCOUNTER — Other Ambulatory Visit: Payer: Self-pay

## 2019-06-27 ENCOUNTER — Encounter (HOSPITAL_COMMUNITY)
Admission: RE | Admit: 2019-06-27 | Discharge: 2019-06-27 | Disposition: A | Discharge status: Home / Self Care | Payer: Medicaid Other | Source: Ambulatory Visit | Attending: General Surgery | Admitting: General Surgery

## 2019-06-27 DIAGNOSIS — C3411 Malignant neoplasm of upper lobe, right bronchus or lung: Secondary | ICD-10-CM | POA: Insufficient documentation

## 2019-06-27 DIAGNOSIS — Z20822 Contact with and (suspected) exposure to covid-19: Secondary | ICD-10-CM | POA: Insufficient documentation

## 2019-06-27 DIAGNOSIS — Z01812 Encounter for preprocedural laboratory examination: Secondary | ICD-10-CM | POA: Insufficient documentation

## 2019-06-27 HISTORY — DX: Gastro-esophageal reflux disease without esophagitis: K21.9

## 2019-06-27 HISTORY — DX: Malignant (primary) neoplasm, unspecified: C80.1

## 2019-06-27 LAB — CBC WITH DIFFERENTIAL/PLATELET
Abs Immature Granulocytes: 0.23 10*3/uL — ABNORMAL HIGH (ref 0.00–0.07)
Basophils Absolute: 0 10*3/uL (ref 0.0–0.1)
Basophils Relative: 0 %
Eosinophils Absolute: 0.1 10*3/uL (ref 0.0–0.5)
Eosinophils Relative: 1 %
HCT: 32 % — ABNORMAL LOW (ref 39.0–52.0)
Hemoglobin: 10.1 g/dL — ABNORMAL LOW (ref 13.0–17.0)
Immature Granulocytes: 4 %
Lymphocytes Relative: 19 %
Lymphs Abs: 1.1 10*3/uL (ref 0.7–4.0)
MCH: 30.5 pg (ref 26.0–34.0)
MCHC: 31.6 g/dL (ref 30.0–36.0)
MCV: 96.7 fL (ref 80.0–100.0)
Monocytes Absolute: 0.8 10*3/uL (ref 0.1–1.0)
Monocytes Relative: 14 %
Neutro Abs: 3.5 10*3/uL (ref 1.7–7.7)
Neutrophils Relative %: 62 %
Platelets: 262 10*3/uL (ref 150–400)
RBC: 3.31 MIL/uL — ABNORMAL LOW (ref 4.22–5.81)
RDW: 14.7 % (ref 11.5–15.5)
WBC: 5.8 10*3/uL (ref 4.0–10.5)
nRBC: 0 % (ref 0.0–0.2)

## 2019-06-27 LAB — COMPREHENSIVE METABOLIC PANEL
ALT: 23 U/L (ref 0–44)
AST: 23 U/L (ref 15–41)
Albumin: 3.1 g/dL — ABNORMAL LOW (ref 3.5–5.0)
Alkaline Phosphatase: 67 U/L (ref 38–126)
Anion gap: 9 (ref 5–15)
BUN: 17 mg/dL (ref 6–20)
CO2: 26 mmol/L (ref 22–32)
Calcium: 9.9 mg/dL (ref 8.9–10.3)
Chloride: 103 mmol/L (ref 98–111)
Creatinine, Ser: 0.66 mg/dL (ref 0.61–1.24)
GFR calc Af Amer: >60 mL/min (ref >60)
GFR calc non Af Amer: >60 mL/min (ref >60)
Glucose, Bld: 97 mg/dL (ref 70–99)
Potassium: 3.5 mmol/L (ref 3.5–5.1)
Sodium: 138 mmol/L (ref 135–145)
Total Bilirubin: 0.7 mg/dL (ref 0.3–1.2)
Total Protein: 8.3 g/dL — ABNORMAL HIGH (ref 6.5–8.1)

## 2019-06-27 LAB — SARS CORONAVIRUS 2 (TAT 6-24 HRS): SARS Coronavirus 2: NEGATIVE

## 2019-06-28 ENCOUNTER — Encounter (HOSPITAL_COMMUNITY): Payer: Self-pay | Admitting: General Practice

## 2019-06-28 NOTE — Progress Notes (Signed)
[facility] CSW Progress Notes  Email from [facility] - they have mailed him a gas card.  Patient notified.  Edwyna Shell, LCSW Clinical Social Worker Phone:  [phone]

## 2019-06-29 ENCOUNTER — Ambulatory Visit (HOSPITAL_COMMUNITY): Payer: Medicaid Other

## 2019-06-29 ENCOUNTER — Ambulatory Visit (HOSPITAL_COMMUNITY): Payer: Medicaid Other | Admitting: Anesthesiology

## 2019-06-29 ENCOUNTER — Encounter: Payer: Self-pay | Admitting: General Practice

## 2019-06-29 ENCOUNTER — Encounter (HOSPITAL_COMMUNITY)
Admission: RE | Disposition: A | Discharge status: Home / Self Care | Payer: Self-pay | Source: Home / Self Care | Attending: General Surgery

## 2019-06-29 ENCOUNTER — Ambulatory Visit (HOSPITAL_COMMUNITY)
Admission: RE | Admit: 2019-06-29 | Discharge: 2019-06-29 | Disposition: A | Discharge status: Home / Self Care | Payer: Medicaid Other | Attending: General Surgery | Admitting: General Surgery

## 2019-06-29 ENCOUNTER — Encounter (HOSPITAL_COMMUNITY): Payer: Self-pay | Admitting: General Surgery

## 2019-06-29 DIAGNOSIS — Z811 Family history of alcohol abuse and dependence: Secondary | ICD-10-CM | POA: Diagnosis not present

## 2019-06-29 DIAGNOSIS — C3491 Malignant neoplasm of unspecified part of right bronchus or lung: Secondary | ICD-10-CM | POA: Insufficient documentation

## 2019-06-29 DIAGNOSIS — D509 Iron deficiency anemia, unspecified: Secondary | ICD-10-CM | POA: Diagnosis not present

## 2019-06-29 DIAGNOSIS — C7931 Secondary malignant neoplasm of brain: Secondary | ICD-10-CM | POA: Insufficient documentation

## 2019-06-29 DIAGNOSIS — Z8249 Family history of ischemic heart disease and other diseases of the circulatory system: Secondary | ICD-10-CM | POA: Insufficient documentation

## 2019-06-29 DIAGNOSIS — Z803 Family history of malignant neoplasm of breast: Secondary | ICD-10-CM | POA: Insufficient documentation

## 2019-06-29 DIAGNOSIS — Z7952 Long term (current) use of systemic steroids: Secondary | ICD-10-CM | POA: Insufficient documentation

## 2019-06-29 DIAGNOSIS — C7951 Secondary malignant neoplasm of bone: Secondary | ICD-10-CM | POA: Diagnosis not present

## 2019-06-29 DIAGNOSIS — Z809 Family history of malignant neoplasm, unspecified: Secondary | ICD-10-CM | POA: Diagnosis not present

## 2019-06-29 DIAGNOSIS — E559 Vitamin D deficiency, unspecified: Secondary | ICD-10-CM | POA: Diagnosis not present

## 2019-06-29 DIAGNOSIS — C3411 Malignant neoplasm of upper lobe, right bronchus or lung: Secondary | ICD-10-CM

## 2019-06-29 DIAGNOSIS — F17210 Nicotine dependence, cigarettes, uncomplicated: Secondary | ICD-10-CM | POA: Insufficient documentation

## 2019-06-29 DIAGNOSIS — Z79899 Other long term (current) drug therapy: Secondary | ICD-10-CM | POA: Insufficient documentation

## 2019-06-29 DIAGNOSIS — Z95828 Presence of other vascular implants and grafts: Secondary | ICD-10-CM

## 2019-06-29 HISTORY — PX: PORTACATH PLACEMENT: SHX2246

## 2019-06-29 SURGERY — INSERTION, TUNNELED CENTRAL VENOUS DEVICE, WITH PORT
Anesthesia: Monitor Anesthesia Care | Site: Chest | Laterality: Left

## 2019-06-29 MED ORDER — KETOROLAC TROMETHAMINE 30 MG/ML IJ SOLN
30.0000 mg | Freq: Once | INTRAMUSCULAR | Status: AC
  Administered 2019-06-29: 30 mg via INTRAVENOUS
  Filled 2019-06-29: qty 1

## 2019-06-29 MED ORDER — CEFAZOLIN SODIUM-DEXTROSE 2-4 GM/100ML-% IV SOLN
2.0000 g | INTRAVENOUS | Status: AC
  Administered 2019-06-29: 2 g via INTRAVENOUS
  Filled 2019-06-29: qty 100

## 2019-06-29 MED ORDER — ONDANSETRON HCL 4 MG/2ML IJ SOLN
INTRAMUSCULAR | Status: DC | PRN
  Administered 2019-06-29: 4 mg via INTRAVENOUS

## 2019-06-29 MED ORDER — PROPOFOL 500 MG/50ML IV EMUL
INTRAVENOUS | Status: DC | PRN
  Administered 2019-06-29: 50 ug/kg/min via INTRAVENOUS

## 2019-06-29 MED ORDER — ONDANSETRON HCL 4 MG/2ML IJ SOLN: 4.0000 mg | Freq: Once | INTRAMUSCULAR | Status: DC | PRN

## 2019-06-29 MED ORDER — CHLORHEXIDINE GLUCONATE CLOTH 2 % EX PADS: 6.0000 | MEDICATED_PAD | Freq: Once | CUTANEOUS | Status: DC

## 2019-06-29 MED ORDER — MIDAZOLAM HCL 2 MG/2ML IJ SOLN
INTRAMUSCULAR | Status: AC
  Filled 2019-06-29: qty 2

## 2019-06-29 MED ORDER — HEPARIN SOD (PORK) LOCK FLUSH 100 UNIT/ML IV SOLN
INTRAVENOUS | Status: DC | PRN
  Administered 2019-06-29: 500 [IU] via INTRAVENOUS

## 2019-06-29 MED ORDER — PROPOFOL 10 MG/ML IV BOLUS
INTRAVENOUS | Status: DC | PRN
  Administered 2019-06-29 (×2): 20 mg via INTRAVENOUS

## 2019-06-29 MED ORDER — MIDAZOLAM HCL 5 MG/5ML IJ SOLN
INTRAMUSCULAR | Status: DC | PRN
  Administered 2019-06-29: 2 mg via INTRAVENOUS

## 2019-06-29 MED ORDER — PROPOFOL 10 MG/ML IV BOLUS
INTRAVENOUS | Status: AC
  Filled 2019-06-29: qty 20

## 2019-06-29 MED ORDER — LIDOCAINE HCL (PF) 1 % IJ SOLN
INTRAMUSCULAR | Status: DC | PRN
  Administered 2019-06-29: 9 mL

## 2019-06-29 MED ORDER — FENTANYL CITRATE (PF) 100 MCG/2ML IJ SOLN
INTRAMUSCULAR | Status: AC
  Filled 2019-06-29: qty 2

## 2019-06-29 MED ORDER — HEPARIN SOD (PORK) LOCK FLUSH 100 UNIT/ML IV SOLN
INTRAVENOUS | Status: AC
  Filled 2019-06-29: qty 5

## 2019-06-29 MED ORDER — MEPERIDINE HCL 50 MG/ML IJ SOLN: 6.2500 mg | INTRAMUSCULAR | Status: DC | PRN

## 2019-06-29 MED ORDER — HYDROMORPHONE HCL 1 MG/ML IJ SOLN: 0.2500 mg | INTRAMUSCULAR | Status: DC | PRN

## 2019-06-29 MED ORDER — LACTATED RINGERS IV SOLN: INTRAVENOUS | Status: DC | PRN

## 2019-06-29 MED ORDER — FENTANYL CITRATE (PF) 100 MCG/2ML IJ SOLN
INTRAMUSCULAR | Status: DC | PRN
  Administered 2019-06-29 (×2): 50 ug via INTRAVENOUS

## 2019-06-29 MED ORDER — SODIUM CHLORIDE (PF) 0.9 % IJ SOLN
INTRAMUSCULAR | Status: DC | PRN
  Administered 2019-06-29: 10 mL via INTRAVENOUS

## 2019-06-29 MED ORDER — LACTATED RINGERS IV SOLN
Freq: Once | INTRAVENOUS | Status: AC
  Administered 2019-06-29: 1000 mL via INTRAVENOUS

## 2019-06-29 MED ORDER — LIDOCAINE HCL (PF) 1 % IJ SOLN
INTRAMUSCULAR | Status: AC
  Filled 2019-06-29: qty 30

## 2019-06-29 SURGICAL SUPPLY — 30 items
APPLICATOR CHLORAPREP 10.5 ORG (MISCELLANEOUS) ×3 IMPLANT
BAG DECANTER FOR FLEXI CONT (MISCELLANEOUS) ×3 IMPLANT
CLOTH BEACON ORANGE TIMEOUT ST (SAFETY) ×3 IMPLANT
COVER LIGHT HANDLE STERIS (MISCELLANEOUS) ×6 IMPLANT
COVER WAND RF STERILE (DRAPES) ×3 IMPLANT
DECANTER SPIKE VIAL GLASS SM (MISCELLANEOUS) ×3 IMPLANT
DERMABOND ADVANCED (GAUZE/BANDAGES/DRESSINGS) ×2
DERMABOND ADVANCED .7 DNX12 (GAUZE/BANDAGES/DRESSINGS) ×1 IMPLANT
DRAPE C-ARM FOLDED MOBILE STRL (DRAPES) ×3 IMPLANT
ELECT REM PT RETURN 9FT ADLT (ELECTROSURGICAL) ×3
ELECTRODE REM PT RTRN 9FT ADLT (ELECTROSURGICAL) ×1 IMPLANT
GLOVE BIOGEL PI IND STRL 7.0 (GLOVE) ×2 IMPLANT
GLOVE BIOGEL PI INDICATOR 7.0 (GLOVE) ×4
GLOVE ECLIPSE 6.5 STRL STRAW (GLOVE) ×2 IMPLANT
GLOVE SURG SS PI 7.5 STRL IVOR (GLOVE) ×3 IMPLANT
GOWN STRL REUS W/TWL LRG LVL3 (GOWN DISPOSABLE) ×6 IMPLANT
IV NS 500ML (IV SOLUTION) ×2
IV NS 500ML BAXH (IV SOLUTION) ×1 IMPLANT
KIT PORT POWER 8FR ISP MRI (Port) ×3 IMPLANT
KIT TURNOVER KIT A (KITS) ×3 IMPLANT
NDL HYPO 25X1 1.5 SAFETY (NEEDLE) ×1 IMPLANT
NEEDLE HYPO 25X1 1.5 SAFETY (NEEDLE) ×3 IMPLANT
PACK MINOR (CUSTOM PROCEDURE TRAY) ×3 IMPLANT
PAD ARMBOARD 7.5X6 YLW CONV (MISCELLANEOUS) ×3 IMPLANT
SET BASIN LINEN APH (SET/KITS/TRAYS/PACK) ×3 IMPLANT
SUT MNCRL AB 4-0 PS2 18 (SUTURE) ×3 IMPLANT
SUT VIC AB 3-0 SH 27 (SUTURE) ×2
SUT VIC AB 3-0 SH 27X BRD (SUTURE) ×1 IMPLANT
SYR 5ML LL (SYRINGE) ×3 IMPLANT
SYR CONTROL 10ML LL (SYRINGE) ×3 IMPLANT

## 2019-06-29 NOTE — Discharge Instructions (Signed)
Implanted Port Home Guide An implanted port is a device that is placed under the skin. It is usually placed in the chest. The device can be used to give IV medicine, to take blood, or for dialysis. You may have an implanted port if:  You need IV medicine that would be irritating to the small veins in your hands or arms.  You need IV medicines, such as antibiotics, for a long period of time.  You need IV nutrition for a long period of time.  You need dialysis. Having a port means that your health care provider will not need to use the veins in your arms for these procedures. You may have fewer limitations when using a port than you would if you used other types of long-term IVs, and you will likely be able to return to normal activities after your incision heals. An implanted port has two main parts:  Reservoir. The reservoir is the part where a needle is inserted to give medicines or draw blood. The reservoir is round. After it is placed, it appears as a small, raised area under your skin.  Catheter. The catheter is a thin, flexible tube that connects the reservoir to a vein. Medicine that is inserted into the reservoir goes into the catheter and then into the vein. How is my port accessed? To access your port:  A numbing cream may be placed on the skin over the port site.  Your health care provider will put on a mask and sterile gloves.  The skin over your port will be cleaned carefully with a germ-killing soap and allowed to dry.  Your health care provider will gently pinch the port and insert a needle into it.  Your health care provider will check for a blood return to make sure the port is in the vein and is not clogged.  If your port needs to remain accessed to get medicine continuously (constant infusion), your health care provider will place a clear bandage (dressing) over the needle site. The dressing and needle will need to be changed every week, or as told by your health care  provider. What is flushing? Flushing helps keep the port from getting clogged. Follow instructions from your health care provider about how and when to flush the port. Ports are usually flushed with saline solution or a medicine called heparin. The need for flushing will depend on how the port is used:  If the port is only used from time to time to give medicines or draw blood, the port may need to be flushed: ? Before and after medicines have been given. ? Before and after blood has been drawn. ? As part of routine maintenance. Flushing may be recommended every 4-6 weeks.  If a constant infusion is running, the port may not need to be flushed.  Throw away any syringes in a disposal container that is meant for sharp items (sharps container). You can buy a sharps container from a pharmacy, or you can make one by using an empty hard plastic bottle with a cover. How long will my port stay implanted? The port can stay in for as long as your health care provider thinks it is needed. When it is time for the port to come out, a surgery will be done to remove it. The surgery will be similar to the procedure that was done to put the port in. Follow these instructions at home:   Flush your port as told by your health care provider.    If you need an infusion over several days, follow instructions from your health care provider about how to take care of your port site. Make sure you: ? Wash your hands with soap and water before you change your dressing. If soap and water are not available, use alcohol-based hand sanitizer. ? Change your dressing as told by your health care provider. ? Place any used dressings or infusion bags into a plastic bag. Throw that bag in the trash. ? Keep the dressing that covers the needle clean and dry. Do not get it wet. ? Do not use scissors or sharp objects near the tube. ? Keep the tube clamped, unless it is being used.  Check your port site every day for signs of  infection. Check for: ? Redness, swelling, or pain. ? Fluid or blood. ? Pus or a bad smell.  Protect the skin around the port site. ? Avoid wearing bra straps that rub or irritate the site. ? Protect the skin around your port from seat belts. Place a soft pad over your chest if needed.  Bathe or shower as told by your health care provider. The site may get wet as long as you are not actively receiving an infusion.  Return to your normal activities as told by your health care provider. Ask your health care provider what activities are safe for you.  Carry a medical alert card or wear a medical alert bracelet at all times. This will let health care providers know that you have an implanted port in case of an emergency. Get help right away if:  You have redness, swelling, or pain at the port site.  You have fluid or blood coming from your port site.  You have pus or a bad smell coming from the port site.  You have a fever. Summary  Implanted ports are usually placed in the chest for long-term IV access.  Follow instructions from your health care provider about flushing the port and changing bandages (dressings).  Take care of the area around your port by avoiding clothing that puts pressure on the area, and by watching for signs of infection.  Protect the skin around your port from seat belts. Place a soft pad over your chest if needed.  Get help right away if you have a fever or you have redness, swelling, pain, drainage, or a bad smell at the port site. This information is not intended to replace advice given to you by your health care provider. Make sure you discuss any questions you have with your health care provider. Document Revised: 09/17/2018 Document Reviewed: 06/28/2016 Elsevier Patient Education  2020 Elsevier Inc.  Monitored Anesthesia Care, Care After These instructions provide you with information about caring for yourself after your procedure. Your health care  provider may also give you more specific instructions. Your treatment has been planned according to current medical practices, but problems sometimes occur. Call your health care provider if you have any problems or questions after your procedure. What can I expect after the procedure? After your procedure, you may:  Feel sleepy for several hours.  Feel clumsy and have poor balance for several hours.  Feel forgetful about what happened after the procedure.  Have poor judgment for several hours.  Feel nauseous or vomit.  Have a sore throat if you had a breathing tube during the procedure. Follow these instructions at home: For at least 24 hours after the procedure:      Have a responsible adult stay with   you. It is important to have someone help care for you until you are awake and alert.  Rest as needed.  Do not: ? Participate in activities in which you could fall or become injured. ? Drive. ? Use heavy machinery. ? Drink alcohol. ? Take sleeping pills or medicines that cause drowsiness. ? Make important decisions or sign legal documents. ? Take care of children on your own. Eating and drinking  Follow the diet that is recommended by your health care provider.  If you vomit, drink water, juice, or soup when you can drink without vomiting.  Make sure you have little or no nausea before eating solid foods. General instructions  Take over-the-counter and prescription medicines only as told by your health care provider.  If you have sleep apnea, surgery and certain medicines can increase your risk for breathing problems. Follow instructions from your health care provider about wearing your sleep device: ? Anytime you are sleeping, including during daytime naps. ? While taking prescription pain medicines, sleeping medicines, or medicines that make you drowsy.  If you smoke, do not smoke without supervision.  Keep all follow-up visits as told by your health care provider.  This is important. Contact a health care provider if:  You keep feeling nauseous or you keep vomiting.  You feel light-headed.  You develop a rash.  You have a fever. Get help right away if:  You have trouble breathing. Summary  For several hours after your procedure, you may feel sleepy and have poor judgment.  Have a responsible adult stay with you for at least 24 hours or until you are awake and alert. This information is not intended to replace advice given to you by your health care provider. Make sure you discuss any questions you have with your health care provider. Document Revised: 08/24/2017 Document Reviewed: 09/16/2015 Elsevier Patient Education  2020 Elsevier Inc.  

## 2019-06-29 NOTE — Op Note (Signed)
Patient:  Brandon Ferguson  DOB:  22-May-1961  MRN:  [id number]   Preop Diagnosis: Right lung carcinoma  Postop Diagnosis: Same  Procedure: Port-A-Cath insertion  Surgeon: Aviva Signs, MD  Anes: MAC  Indications: Patient is a 59 year old black male who was referred to my care for Port-A-Cath insertion.  He has metastatic right lung carcinoma and is about to undergo chemotherapy.  The risks and benefits of the procedure including bleeding, infection, and pneumothorax were fully explained to the patient, who gave informed consent.  Procedure note: The patient was placed in the Trendelenburg position after the left upper chest was prepped and draped using the usual sterile technique with ChloraPrep.  Surgical site confirmation was performed.  1% Xylocaine was used for local anesthesia.  An incision was made below the left clavicle.  A subcutaneous pocket was formed.  A needle was advanced into the left subclavian vein using the Seldinger technique without difficulty.  A guidewire was then advanced into the right atrium under fluoroscopic guidance.  An introducer peel-away sheath were placed over the guidewire.  The catheter was inserted through the peel-away sheath and the peel-away sheath was removed.  The catheter was then attached to the port and the port placed in subcutaneous pocket.  Adequate positioning was confirmed by fluoroscopy.  Good backflow of venous blood was noted on aspiration of the port.  The port was flushed with heparin flush.  The subcutaneous layer was reapproximated using a 3-0 Vicryl interrupted suture.  Skin was closed using a 4-0 Monocryl subcuticular suture.  Dermabond was applied.  All tape and needle counts were correct at the end of the procedure.  The patient was awakened and transferred to PACU in stable condition.  A chest x-ray will be performed at that time.    Complications: None  EBL: Minimal  Specimen: None

## 2019-06-29 NOTE — Interval H&P Note (Signed)
History and Physical Interval Note:  06/29/2019 8:33 AM  Brandon Ferguson  has presented today for surgery, with the diagnosis of Right Lung Cancer.  The various methods of treatment have been discussed with the patient and family. After consideration of risks, benefits and other options for treatment, the patient has consented to  Procedure(s): INSERTION PORT-A-CATH (Left) as a surgical intervention.  The patient's history has been reviewed, patient examined, no change in status, stable for surgery.  I have reviewed the patient's chart and labs.  Questions were answered to the patient's satisfaction.     Aviva Signs

## 2019-06-29 NOTE — Anesthesia Preprocedure Evaluation (Addendum)
Anesthesia Evaluation  Patient identified by MRN, date of birth, ID band Patient awake    Reviewed: Allergy & Precautions, NPO status , Patient's Chart, lab work & pertinent test results  Airway Mallampati: II  TM Distance: >3 FB Neck ROM: Full   Comment: Abnormal marrow signal throughout much of the cervical spine suspicious for widespread bone metastases although enhancing metastases are only confirmed in the visible upper thoracic spine (including with early ventral epidural spread at T3). 4. Superimposed cervical spine degeneration with degenerative spinal stenosis at C4-C5 through C6-C7 with degenerative spinal cord mass effect but no associated cord signal abnormality. 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 Approximately 22 individual brain metastases ranging from punctate to 26 mm diameter, including an unusual trans-falcine lesion with some associated microhemorrhage and dural involvement. 2. Confluent cerebral vasogenic edema with no midline shift or loss of basilar cisterns. 3. Abnormal marrow signal throughout much of the cervical spine suspicious for widespread bone metastases although enhancing metastases are only confirmed in the visible upper thoracic spine (including with early ventral epidural spread at T3). 4. Superimposed cervical spine degeneration with degenerative spinal stenosis at C4-C5 through C6-C7 with degenerative spinal cord mass effect but no associated cord signal abnormality. negative psych ROS    GI/Hepatic GERD  Medicated,(+)     substance abuse  alcohol use,   Endo/Other  negative endocrine ROS  Renal/GU negative Renal ROS  negative genitourinary   Musculoskeletal negative musculoskeletal ROS (+) 1. 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 Approximately 22 individual brain metastases ranging from punctate to 26 mm diameter, including an unusual trans-falcine lesion with some associated microhemorrhage and dural involvement. 2. Confluent cerebral vasogenic edema with no midline shift or loss of basilar cisterns. 3. Abnormal marrow signal throughout much of the cervical spine suspicious for widespread bone metastases although enhancing metastases are only confirmed in the visible upper thoracic spine (including with early ventral epidural spread at T3). 4. Superimposed cervical spine degeneration with degenerative spinal stenosis at C4-C5 through C6-C7 with degenerative spinal cord mass effect but no associated cord signal abnormality.   Abdominal   Peds negative pediatric ROS (+)  Hematology  (+) anemia ,   Anesthesia Other Findings   Reproductive/Obstetrics negative OB ROS                           Anesthesia Physical Anesthesia Plan  ASA: IV  Anesthesia Plan: MAC   Post-op Pain Management:    Induction: Intravenous  PONV Risk Score and Plan:   Airway Management Planned: Nasal Cannula, Natural Airway and Simple Face Mask  Additional Equipment:   Intra-op Plan:   Post-operative Plan:   Informed Consent: I have reviewed the patients History and Physical, chart, labs and discussed the procedure including the risks, benefits and alternatives for the proposed anesthesia with the patient or authorized representative who has indicated his/her understanding and acceptance.       Plan Discussed with: CRNA and Surgeon  Anesthesia Plan Comments:        Anesthesia Quick Evaluation

## 2019-06-29 NOTE — Addendum Note (Signed)
Addendum  created 06/29/19 1124 by Ollen Bowl, CRNA   Charge Capture section accepted

## 2019-06-29 NOTE — Anesthesia Postprocedure Evaluation (Signed)
Anesthesia Post Note  Patient: Brandon Ferguson  Procedure(s) Performed: INSERTION PORT-A-CATH (Left Chest)  Patient location during evaluation: PACU Anesthesia Type: MAC Level of consciousness: awake and alert and oriented Pain management: pain level controlled Vital Signs Assessment: post-procedure vital signs reviewed and stable Respiratory status: spontaneous breathing Cardiovascular status: blood pressure returned to baseline and stable Postop Assessment: no apparent nausea or vomiting Anesthetic complications: no     Last Vitals:  Vitals:   06/29/19 1015 06/29/19 1032  BP: 119/79 117/81  Pulse: 85 85  Resp: 14 18  Temp:  36.5 C  SpO2: 100% 97%    Last Pain:  Vitals:   06/29/19 1034  TempSrc:   PainSc: 0-No pain                 Joselin Crandell

## 2019-06-29 NOTE — Transfer of Care (Signed)
Immediate Anesthesia Transfer of Care Note  Patient: Brandon Ferguson  Procedure(s) Performed: INSERTION PORT-A-CATH (Left Chest)  Patient Location: PACU  Anesthesia Type:MAC  Level of Consciousness: awake  Airway & Oxygen Therapy: Patient Spontanous Breathing  Post-op Assessment: Report given to RN  Post vital signs: Reviewed and stable  Last Vitals:  Vitals Value Taken Time  BP 105/72 06/29/19 0945  Temp    Pulse 89 06/29/19 0947  Resp 20 06/29/19 0948  SpO2 89 % 06/29/19 0947  Vitals shown include unvalidated device data.  Last Pain:  Vitals:   06/29/19 0717  TempSrc: Oral  PainSc: 5       Patients Stated Pain Goal: 8 (36/43/83 7793)  Complications: No apparent anesthesia complications

## 2019-06-29 NOTE — Progress Notes (Signed)
[facility] CSW Progress Notes  Per DSS Medicaid worker, patient has application in process for Medicaid.  Edwyna Shell, LCSW Clinical Social Worker Phone:  [phone] Cell:  [phone]

## 2019-07-04 ENCOUNTER — Encounter: Payer: Self-pay | Admitting: Pharmacy Technician

## 2019-07-04 NOTE — Progress Notes (Signed)
Patient has been approved for drug assistance by [employer] for [employer]. The enrollment period is from 07/01/19-06/30/20 based on self pay. First DOS covered is 07/05/19.

## 2019-07-04 NOTE — Progress Notes (Signed)
Patient has been approved for drug assistance by [employer] for [employer]. The enrollment period is from 07/01/19-06/30/20 based on self pay. First DOS covered is 07/05/19.

## 2019-07-05 ENCOUNTER — Encounter (HOSPITAL_COMMUNITY): Payer: Self-pay | Admitting: Hematology

## 2019-07-05 ENCOUNTER — Inpatient Hospital Stay (HOSPITAL_COMMUNITY): Payer: Medicaid Other | Admitting: General Practice

## 2019-07-05 ENCOUNTER — Inpatient Hospital Stay (HOSPITAL_COMMUNITY): Payer: Medicaid Other

## 2019-07-05 ENCOUNTER — Inpatient Hospital Stay (HOSPITAL_BASED_OUTPATIENT_CLINIC_OR_DEPARTMENT_OTHER): Payer: Medicaid Other | Admitting: Hematology

## 2019-07-05 ENCOUNTER — Other Ambulatory Visit: Payer: Self-pay

## 2019-07-05 VITALS — BP 111/70 | HR 81 | Temp 96.8°F | Resp 18

## 2019-07-05 VITALS — BP 90/60 | HR 97 | Temp 97.5°F | Resp 18 | Wt 140.2 lb

## 2019-07-05 DIAGNOSIS — C3411 Malignant neoplasm of upper lobe, right bronchus or lung: Secondary | ICD-10-CM

## 2019-07-05 DIAGNOSIS — Z95828 Presence of other vascular implants and grafts: Secondary | ICD-10-CM

## 2019-07-05 DIAGNOSIS — Z5112 Encounter for antineoplastic immunotherapy: Secondary | ICD-10-CM | POA: Diagnosis not present

## 2019-07-05 LAB — CBC WITH DIFFERENTIAL/PLATELET
Abs Immature Granulocytes: 0.08 10*3/uL — ABNORMAL HIGH (ref 0.00–0.07)
Basophils Absolute: 0 10*3/uL (ref 0.0–0.1)
Basophils Relative: 0 %
Eosinophils Absolute: 0.1 10*3/uL (ref 0.0–0.5)
Eosinophils Relative: 2 %
HCT: 31.4 % — ABNORMAL LOW (ref 39.0–52.0)
Hemoglobin: 9.8 g/dL — ABNORMAL LOW (ref 13.0–17.0)
Immature Granulocytes: 1 %
Lymphocytes Relative: 20 %
Lymphs Abs: 1.2 10*3/uL (ref 0.7–4.0)
MCH: 30 pg (ref 26.0–34.0)
MCHC: 31.2 g/dL (ref 30.0–36.0)
MCV: 96 fL (ref 80.0–100.0)
Monocytes Absolute: 0.9 10*3/uL (ref 0.1–1.0)
Monocytes Relative: 15 %
Neutro Abs: 3.7 10*3/uL (ref 1.7–7.7)
Neutrophils Relative %: 62 %
Platelets: 312 10*3/uL (ref 150–400)
RBC: 3.27 MIL/uL — ABNORMAL LOW (ref 4.22–5.81)
RDW: 14.5 % (ref 11.5–15.5)
WBC: 5.9 10*3/uL (ref 4.0–10.5)
nRBC: 0 % (ref 0.0–0.2)

## 2019-07-05 LAB — COMPREHENSIVE METABOLIC PANEL
ALT: 13 U/L (ref 0–44)
AST: 18 U/L (ref 15–41)
Albumin: 3.2 g/dL — ABNORMAL LOW (ref 3.5–5.0)
Alkaline Phosphatase: 74 U/L (ref 38–126)
Anion gap: 10 (ref 5–15)
BUN: 15 mg/dL (ref 6–20)
CO2: 26 mmol/L (ref 22–32)
Calcium: 10.1 mg/dL (ref 8.9–10.3)
Chloride: 101 mmol/L (ref 98–111)
Creatinine, Ser: 0.58 mg/dL — ABNORMAL LOW (ref 0.61–1.24)
GFR calc Af Amer: >60 mL/min (ref >60)
GFR calc non Af Amer: >60 mL/min (ref >60)
Glucose, Bld: 99 mg/dL (ref 70–99)
Potassium: 4 mmol/L (ref 3.5–5.1)
Sodium: 137 mmol/L (ref 135–145)
Total Bilirubin: 0.7 mg/dL (ref 0.3–1.2)
Total Protein: 8.4 g/dL — ABNORMAL HIGH (ref 6.5–8.1)

## 2019-07-05 LAB — TSH: TSH: 1.212 u[IU]/mL (ref 0.350–4.500)

## 2019-07-05 MED ORDER — SODIUM CHLORIDE 0.9 % IV SOLN
500.0000 mg/m2 | Freq: Once | INTRAVENOUS | Status: AC
  Administered 2019-07-05: 900 mg via INTRAVENOUS
  Filled 2019-07-05: qty 16

## 2019-07-05 MED ORDER — PALONOSETRON HCL INJECTION 0.25 MG/5ML
INTRAVENOUS | Status: AC
  Filled 2019-07-05: qty 5

## 2019-07-05 MED ORDER — SODIUM CHLORIDE 0.9 % IV SOLN
10.0000 mg | Freq: Once | INTRAVENOUS | Status: AC
  Administered 2019-07-05: 10 mg via INTRAVENOUS
  Filled 2019-07-05: qty 10

## 2019-07-05 MED ORDER — SODIUM CHLORIDE 0.9% FLUSH
10.0000 mL | INTRAVENOUS | Status: DC | PRN
  Administered 2019-07-05: 10 mL

## 2019-07-05 MED ORDER — PALONOSETRON HCL INJECTION 0.25 MG/5ML
0.2500 mg | Freq: Once | INTRAVENOUS | Status: AC
  Administered 2019-07-05: 0.25 mg via INTRAVENOUS

## 2019-07-05 MED ORDER — SODIUM CHLORIDE 0.9 % IV SOLN
200.0000 mg | Freq: Once | INTRAVENOUS | Status: AC
  Administered 2019-07-05: 200 mg via INTRAVENOUS
  Filled 2019-07-05: qty 8

## 2019-07-05 MED ORDER — SODIUM CHLORIDE 0.9 % IV SOLN
150.0000 mg | Freq: Once | INTRAVENOUS | Status: AC
  Administered 2019-07-05: 150 mg via INTRAVENOUS
  Filled 2019-07-05: qty 150

## 2019-07-05 MED ORDER — TRAMADOL HCL 50 MG PO TABS: 50.0000 mg | ORAL_TABLET | Freq: Every evening | ORAL | 0 refills | Status: DC | PRN

## 2019-07-05 MED ORDER — SODIUM CHLORIDE 0.9 % IV SOLN
581.5000 mg | Freq: Once | INTRAVENOUS | Status: AC
  Administered 2019-07-05: 580 mg via INTRAVENOUS
  Filled 2019-07-05: qty 58

## 2019-07-05 MED ORDER — SODIUM CHLORIDE 0.9 % IV SOLN: Freq: Once | INTRAVENOUS | Status: AC

## 2019-07-05 MED ORDER — HEPARIN SOD (PORK) LOCK FLUSH 100 UNIT/ML IV SOLN
500.0000 [IU] | Freq: Once | INTRAVENOUS | Status: AC | PRN
  Administered 2019-07-05: 500 [IU]

## 2019-07-05 NOTE — Patient Instructions (Addendum)
[place] at [facility] Discharge Instructions  You were seen today by [name]. He went over your recent lab results. For the next 2 days give the nausea medication 3-4 times a day to help prevent nausea. He will see you back in 1 week for labs and follow up.   Thank you for choosing [place] at [facility] to provide your oncology and hematology care.  To afford each patient quality time with our provider, please arrive at least 15 minutes before your scheduled appointment time.   If you have a lab appointment with the [facility] please come in thru the  Main Entrance and check in at the main information desk  You need to re-schedule your appointment should you arrive 10 or more minutes late.  We strive to give you quality time with our providers, and arriving late affects you and other patients whose appointments are after yours.  Also, if you no show three or more times for appointments you may be dismissed from the clinic at the providers discretion.     Again, thank you for choosing [facility].  Our hope is that these requests will decrease the amount of time that you wait before being seen by our physicians.       _____________________________________________________________  Should you have questions after your visit to [facility], please contact our office at [phone] between the hours of 8:00 a.m. and 4:30 p.m.  Voicemails left after 4:00 p.m. will not be returned until the following business day.  For prescription refill requests, have your pharmacy contact our office and allow 72 hours.    Cancer Center Support Programs:   > Cancer Support Group  2nd Tuesday of the month 1pm-2pm, Journey Room

## 2019-07-05 NOTE — Progress Notes (Signed)
Labs reviewed with MD today. Proceed with Day one of treatment today per MD.  Treatment given per orders. Patient tolerated it well without problems. Vitals stable and discharged home from clinic via wheelchair. Follow up as scheduled.

## 2019-07-05 NOTE — Progress Notes (Signed)
Patient has been assessed, vital signs and labs have been reviewed by Dr. Katragadda. ANC, Creatinine, LFTs, and Platelets are within treatment parameters per Dr. Katragadda. The patient is good to proceed with treatment at this time.  

## 2019-07-05 NOTE — Progress Notes (Signed)
Brandon Ferguson, [place] [id number]   CLINIC:  Medical Oncology/Hematology  PCP:  Patient, No Pcp Per No address on file None   REASON FOR VISIT:  Metastatic adenocarcinoma of the lung.  CURRENT THERAPY: Carboplatin, pembrolizumab and pemetrexed.  BRIEF ONCOLOGIC HISTORY:  Oncology History  Malignant neoplasm of right upper lobe of lung ([place])  05/02/2019 Initial Diagnosis   Malignant neoplasm of upper lobe of right lung ([place])   05/19/2019 Cancer Staging   Staging form: Lung, AJCC 8th Edition - Clinical stage from 05/19/2019: Stage IVB (cT2b, cN1, pM1c) - Signed by Derek Jack, MD on 05/19/2019   07/05/2019 -  Chemotherapy   The patient had palonosetron (ALOXI) injection 0.25 mg, 0.25 mg, Intravenous,  Once, 1 of 4 cycles Administration: 0.25 mg (07/05/2019) PEMEtrexed (ALIMTA) 900 mg in sodium chloride 0.9 % 100 mL chemo infusion, 500 mg/m2 = 900 mg, Intravenous,  Once, 1 of 6 cycles Administration: 900 mg (07/05/2019) CARBOplatin (PARAPLATIN) 580 mg in sodium chloride 0.9 % 250 mL chemo infusion, 580 mg (100 % of original dose 581.5 mg), Intravenous,  Once, 1 of 4 cycles Dose modification:   (original dose 581.5 mg, Cycle 1) Administration: 580 mg (07/05/2019) fosaprepitant (EMEND) 150 mg in sodium chloride 0.9 % 145 mL IVPB, 150 mg, Intravenous,  Once, 1 of 4 cycles Administration: 150 mg (07/05/2019) pembrolizumab (KEYTRUDA) 200 mg in sodium chloride 0.9 % 50 mL chemo infusion, 200 mg, Intravenous, Once, 1 of 6 cycles Administration: 200 mg (07/05/2019)  for chemotherapy treatment.       CANCER STAGING: Cancer Staging Malignant neoplasm of right upper lobe of lung [facility]) Staging form: Lung, AJCC 8th Edition - Clinical stage from 05/19/2019: Stage IVB (cT2b, cN1, pM1c) - Signed by Derek Jack, MD on 05/19/2019    INTERVAL HISTORY:  Brandon Ferguson 59 y.o. male seen for follow-up of metastatic adenocarcinoma of the lung.  He  had a port placed last week.  Appetite is 25%.  Energy levels are 75%.  Continues to have low back pain rated as 10 out of 10, predominantly at nighttime.  She is taking Tylenol but is not helping.  She could not sleep as result of it.  Chronic cough is stable.  Denies any history of autoimmune problems.    REVIEW OF SYSTEMS:  Review of Systems  Respiratory: Positive for cough.   Musculoskeletal:       Low back pain, right mid thigh and right knee pain.  Neurological: Positive for dizziness.  All other systems reviewed and are negative.    PAST MEDICAL/SURGICAL HISTORY:  Past Medical History:  Diagnosis Date  . Cancer ([place])   . GERD (gastroesophageal reflux disease)   . Iron deficiency   . Port-A-Cath in place 06/17/2019  . Vitamin D deficiency    Past Surgical History:  Procedure Laterality Date  . KNEE SURGERY    . PORTACATH PLACEMENT Left 06/29/2019   Procedure: INSERTION PORT-A-CATH;  Surgeon: Aviva Signs, MD;  Location: [facility];  Service: General;  Laterality: Left;  [name] Kitchen VIDEO BRONCHOSCOPY Bilateral 04/29/2019   Procedure: VIDEO BRONCHOSCOPY WITH FLUORO;  Surgeon: Laurin Coder, MD;  Location: [facility];  Service: Endoscopy;  Laterality: Bilateral;  . VIDEO BRONCHOSCOPY Bilateral 05/03/2019   Procedure: VIDEO BRONCHOSCOPY WITHOUT FLUORO;  Surgeon: Rigoberto Noel, MD;  Location: [facility];  Service: Cardiopulmonary;  Laterality: Bilateral;     SOCIAL HISTORY:  Social History   Socioeconomic History  . Marital status: Single  Spouse name: Not on file  . Number of children: Not on file  . Years of education: Not on file  . Highest education level: Not on file  Occupational History  . Not on file  Tobacco Use  . Smoking status: Current Every Day Smoker    Packs/day: 1.00    Types: Cigarettes  . Smokeless tobacco: Never Used  . Tobacco comment: quit cigs 5 months ago  Substance and Sexual Activity  . Alcohol use: Yes    Alcohol/week: 6.0 standard drinks      Types: 6 Cans of beer per week    Comment: pt drinks 3 large beers daily. hasnt had any in 5 months  . Drug use: Yes    Types: Marijuana    Comment: daily. as of Jan 18,2021 hasnt had any in 5 months  . Sexual activity: Not on file  Other Topics Concern  . Not on file  Social History Narrative  . Not on file   Social Determinants of Health   Financial Resource Strain: Low Risk   . Difficulty of Paying Living Expenses: Not very hard  Food Insecurity: No Food Insecurity  . Worried About Charity fundraiser in the Last Year: Never true  . Ran Out of Food in the Last Year: Never true  Transportation Needs: No Transportation Needs  . Lack of Transportation (Medical): No  . Lack of Transportation (Non-Medical): No  Physical Activity: Inactive  . Days of Exercise per Week: 0 days  . Minutes of Exercise per Session: 0 min  Stress: No Stress Concern Present  . Feeling of Stress : Only a little  Social Connections: Somewhat Isolated  . Frequency of Communication with Friends and Family: More than three times a week  . Frequency of Social Gatherings with Friends and Family: More than three times a week  . Attends Religious Services: 1 to 4 times per year  . Active Member of Clubs or Organizations: No  . Attends Archivist Meetings: Never  . Marital Status: Never married  Intimate Partner Violence: Not At Risk  . Fear of Current or Ex-Partner: No  . Emotionally Abused: No  . Physically Abused: No  . Sexually Abused: No    FAMILY HISTORY:  Family History  Problem Relation Age of Onset  . Breast cancer Mother   . Cirrhosis Father   . Alcohol abuse Father   . Hypertension Sister   . Cancer Brother   . Hypertension Sister   . Hypertension Sister   . Hypertension Sister   . Hypertension Sister     CURRENT MEDICATIONS:  Outpatient Encounter Medications as of 07/05/2019  Medication Sig  . CARBOPLATIN IV Inject into the vein every 21 ( twenty-one) days. Every 3 weeks  x 4 cycles  . dexamethasone (DECADRON) 4 MG tablet Take 1 tablet (4 mg total) by mouth 2 (two) times daily.  . ferrous sulfate 325 (65 FE) MG EC tablet Take 325 mg by mouth daily.  . folic acid (FOLVITE) 1 MG tablet Take 1 tablet (1 mg total) by mouth daily.  . Multiple Vitamin (MULTIVITAMIN WITH MINERALS) TABS tablet Take 1 tablet by mouth daily.  [name] Kitchen PEMBROLIZUMAB IV Inject into the vein every 21 ( twenty-one) days.  [name] Kitchen PEMEtrexed 500 mg/m2 in sodium chloride 0.9 % 100 mL Inject 500 mg/m2 into the vein every 21 ( twenty-one) days. Every 3 weeks x 4 cycles  . thiamine 100 MG tablet Take 1 tablet (100 mg total) by mouth  daily.  . [DISCONTINUED] levETIRAcetam (KEPPRA) 500 MG tablet Take 1 tablet (500 mg total) by mouth 2 (two) times daily.  . [DISCONTINUED] pantoprazole (PROTONIX) 40 MG tablet Take 1 tablet (40 mg total) by mouth daily.  [name] Kitchen acetaminophen (TYLENOL) 500 MG tablet Take 1,000 mg by mouth every 6 (six) hours as needed for moderate pain or headache.   . lidocaine-prilocaine (EMLA) cream Apply a small amount to port a cath site and cover with plastic wrap 1 hour prior to chemotherapy appointments (Patient not taking: Reported on 07/05/2019)  . polyethylene glycol (MIRALAX / GLYCOLAX) 17 g packet Take 17 g by mouth daily as needed. (Patient not taking: Reported on 07/05/2019)  . prochlorperazine (COMPAZINE) 10 MG tablet Take 1 tablet (10 mg total) by mouth every 6 (six) hours as needed (Nausea or vomiting). (Patient not taking: Reported on 07/05/2019)  . traMADol (ULTRAM) 50 MG tablet Take 1 tablet (50 mg total) by mouth at bedtime as needed.   No facility-administered encounter medications on file as of 07/05/2019.    ALLERGIES:  No Known Allergies   PHYSICAL EXAM:  ECOG Performance status: 1  Vitals:   07/05/19 0839  BP: 90/60  Pulse: 97  Resp: 18  Temp: (!) 97.5 F (36.4 C)  SpO2: 99%   Filed Weights   07/05/19 0839  Weight: 140 lb 3.2 oz (63.6 kg)    Physical Exam Vitals  reviewed.  Constitutional:      Appearance: Normal appearance.  Cardiovascular:     Rate and Rhythm: Normal rate and regular rhythm.     Heart sounds: Normal heart sounds.  Pulmonary:     Effort: Pulmonary effort is normal.     Breath sounds: Normal breath sounds.  Abdominal:     General: There is no distension.     Palpations: Abdomen is soft. There is no mass.  Skin:    General: Skin is warm.  Neurological:     Mental Status: He is alert and oriented to person, place, and time.  Psychiatric:        Mood and Affect: Mood normal.        Behavior: Behavior normal.      LABORATORY DATA:  I have reviewed the labs as listed.  CBC    Component Value Date/Time   WBC 5.9 07/05/2019 0750   RBC 3.27 (L) 07/05/2019 0750   HGB 9.8 (L) 07/05/2019 0750   HCT 31.4 (L) 07/05/2019 0750   PLT 312 07/05/2019 0750   MCV 96.0 07/05/2019 0750   MCH 30.0 07/05/2019 0750   MCHC 31.2 07/05/2019 0750   RDW 14.5 07/05/2019 0750   LYMPHSABS 1.2 07/05/2019 0750   MONOABS 0.9 07/05/2019 0750   EOSABS 0.1 07/05/2019 0750   BASOSABS 0.0 07/05/2019 0750   CMP Latest Ref Rng & Units 07/05/2019 06/27/2019 05/04/2019  Glucose 70 - 99 mg/dL 99 97 154(H)  BUN 6 - 20 mg/dL 15 17 19   Creatinine 0.61 - 1.24 mg/dL 0.58(L) 0.66 0.47(L)  Sodium 135 - 145 mmol/L 137 138 130(L)  Potassium 3.5 - 5.1 mmol/L 4.0 3.5 4.1  Chloride 98 - 111 mmol/L 101 103 98  CO2 22 - 32 mmol/L 26 26 24   Calcium 8.9 - 10.3 mg/dL 10.1 9.9 9.6  Total Protein 6.5 - 8.1 g/dL 8.4(H) 8.3(H) -  Total Bilirubin 0.3 - 1.2 mg/dL 0.7 0.7 -  Alkaline Phos 38 - 126 U/L 74 67 -  AST 15 - 41 U/L 18 23 -  ALT 0 -  44 U/L 13 23 -       DIAGNOSTIC IMAGING:  I have independently reviewed the scans and discussed with the patient.    ASSESSMENT & PLAN:   Malignant neoplasm of right upper lobe of lung ([place]) 1.  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 -PET  scan on 05/17/2019 shows right upper lobe hypermetabolic mass 4.2 cm.  Right hilar lymph node 12 mm.  Extensive hypermetabolic osseous meta stasis with right superior pubic ramus, anterior acetabulum lesion, left sacral lesion, L4 vertebral body. -Biopsy of the right iliac bone on 05/20/2019 consistent with adenocarcinoma. -He had a port placed last week.  I reviewed his x-ray which is within normal limits. -We talked about initiating carboplatin, pemetrexed and pembrolizumab today.  We discussed the side effects in detail.  We also talked about immunotherapy related side effects. -I have reviewed his labs.  He will continue folic acid daily.  He already received B12 injection. -I plan to rescan him after 3 cycles of treatment.  2.  Brain metastasis: -Presentation with right arm weakness with brain MRI on 04/28/2019 showing widespread metastatic disease to the brain, approximately 22 individual brain lesions. -Whole brain radiation therapy completed on 05/19/2019. -Radiation therapy to the bone metastasis completed on 06/14/2019. -We will follow up on the posttreatment brain MRI.  3.  Back pain: -He finished radiation.  However he continues to have pain, mostly at nighttime. -I will start him on tramadol 50 mg at bedtime.  4.  Normocytic anemia: -CBC showed hemoglobin 9.8.  Likely from recent radiation and chronic inflammation. -We will consider checking ferritin, P82, folic acid levels.    Orders placed this encounter:  Orders Placed This Encounter  Procedures  . CBC with Differential/Platelet  . Comprehensive metabolic panel  . Magnesium   Total time spent is 30 minutes with more than 50% of the time spent face-to-face discussing initiating new chemotherapy, side effects, counseling and coordination of care.   Derek Jack, MD [place] [phone]

## 2019-07-05 NOTE — Progress Notes (Signed)
[facility] CSW Progress Notes  Check in visit w patient in infusion.  He had difficulty remembering past conversations re assistance from [employer] who are helping w utility bill ("I guess so - I dont remember), [employer] gas card program (they will mail one $50 card to patient's home).  Did not remember speaking w CSW on phone.  States he is "OK" w food as he gets Physicist, medical - family shops for him if he needs this.  Uses cane and walker at home to ambulate - this is new since cancer diagnosis, feels he is much weaker.  Sister working on [employer] and disability application - per DSS, these are in "progress", no additional paperwork needed at this time.  Edwyna Shell, LCSW Clinical Social Worker Phone:  [phone]

## 2019-07-06 ENCOUNTER — Other Ambulatory Visit (HOSPITAL_COMMUNITY): Payer: Self-pay | Admitting: *Deleted

## 2019-07-06 ENCOUNTER — Telehealth (HOSPITAL_COMMUNITY): Payer: Self-pay | Admitting: *Deleted

## 2019-07-06 ENCOUNTER — Encounter: Payer: Self-pay | Admitting: Pharmacy Technician

## 2019-07-06 ENCOUNTER — Telehealth (HOSPITAL_COMMUNITY): Payer: Self-pay

## 2019-07-06 ENCOUNTER — Telehealth: Payer: Self-pay | Admitting: Radiation Oncology

## 2019-07-06 MED ORDER — PANTOPRAZOLE SODIUM 40 MG PO TBEC: 40.0000 mg | DELAYED_RELEASE_TABLET | Freq: Every day | ORAL | 2 refills | Status: DC

## 2019-07-06 MED ORDER — LEVETIRACETAM 500 MG PO TABS: 500.0000 mg | ORAL_TABLET | Freq: Two times a day (BID) | ORAL | 1 refills | Status: DC

## 2019-07-06 NOTE — Telephone Encounter (Signed)
24 hour call back -left message with his sister to call for any concerns or questions.

## 2019-07-06 NOTE — Progress Notes (Signed)
Patient has been approved for drug assistance by [employer] for [employer]. The enrollment period is from 07/06/19-07/05/20 based on self pay. First DOS covered is 07/06/19.

## 2019-07-06 NOTE — Telephone Encounter (Signed)
I called the patient's sister Ms. Williams and the patient is off steroids. We reviewed the plan for MRI of the brain in March and that he would continue with [name] in [place] with systemic therapy.

## 2019-07-08 ENCOUNTER — Telehealth: Payer: Self-pay | Admitting: Radiation Therapy

## 2019-07-08 ENCOUNTER — Other Ambulatory Visit: Payer: Self-pay | Admitting: Radiation Therapy

## 2019-07-08 DIAGNOSIS — C7931 Secondary malignant neoplasm of brain: Secondary | ICD-10-CM

## 2019-07-08 DIAGNOSIS — C7949 Secondary malignant neoplasm of other parts of nervous system: Secondary | ICD-10-CM

## 2019-07-08 NOTE — Telephone Encounter (Signed)
Spoke with the patient's sister, [place], about his upcoming brain MRI and follow-up appointments. She has written these down and plans to have him there.    Mont Dutton R.T.(R)(T) Radiation Special Procedures Navigator

## 2019-07-10 NOTE — Assessment & Plan Note (Signed)
1.  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 -PET scan on 05/17/2019 shows right upper lobe hypermetabolic mass 4.2 cm.  Right hilar lymph node 12 mm.  Extensive hypermetabolic osseous meta stasis with right superior pubic ramus, anterior acetabulum lesion, left sacral lesion, L4 vertebral body. -Biopsy of the right iliac bone on 05/20/2019 consistent with adenocarcinoma. -He had a port placed last week.  I reviewed his x-ray which is within normal limits. -We talked about initiating carboplatin, pemetrexed and pembrolizumab today.  We discussed the side effects in detail.  We also talked about immunotherapy related side effects. -I have reviewed his labs.  He will continue folic acid daily.  He already received B12 injection. -I plan to rescan him after 3 cycles of treatment.  2.  Brain metastasis: -Presentation with right arm weakness with brain MRI on 04/28/2019 showing widespread metastatic disease to the brain, approximately 22 individual brain lesions. -Whole brain radiation therapy completed on 05/19/2019. -Radiation therapy to the bone metastasis completed on 06/14/2019. -We will follow up on the posttreatment brain MRI.  3.  Back pain: -He finished radiation.  However he continues to have pain, mostly at nighttime. -I will start him on tramadol 50 mg at bedtime.  4.  Normocytic anemia: -CBC showed hemoglobin 9.8.  Likely from recent radiation and chronic inflammation. -We will consider checking ferritin, J24, folic acid levels.

## 2019-07-12 ENCOUNTER — Other Ambulatory Visit: Payer: Self-pay

## 2019-07-12 ENCOUNTER — Encounter (HOSPITAL_COMMUNITY): Payer: Self-pay | Admitting: Hematology

## 2019-07-12 ENCOUNTER — Inpatient Hospital Stay (HOSPITAL_COMMUNITY): Payer: Medicaid Other

## 2019-07-12 ENCOUNTER — Inpatient Hospital Stay (HOSPITAL_COMMUNITY): Payer: Medicaid Other | Attending: Hematology | Admitting: Hematology

## 2019-07-12 VITALS — BP 94/67 | HR 102 | Temp 97.1°F | Resp 20 | Wt 132.9 lb

## 2019-07-12 DIAGNOSIS — E878 Other disorders of electrolyte and fluid balance, not elsewhere classified: Secondary | ICD-10-CM | POA: Insufficient documentation

## 2019-07-12 DIAGNOSIS — Z5112 Encounter for antineoplastic immunotherapy: Secondary | ICD-10-CM | POA: Insufficient documentation

## 2019-07-12 DIAGNOSIS — C3411 Malignant neoplasm of upper lobe, right bronchus or lung: Secondary | ICD-10-CM

## 2019-07-12 DIAGNOSIS — M545 Low back pain: Secondary | ICD-10-CM | POA: Diagnosis not present

## 2019-07-12 DIAGNOSIS — R42 Dizziness and giddiness: Secondary | ICD-10-CM | POA: Diagnosis not present

## 2019-07-12 DIAGNOSIS — K219 Gastro-esophageal reflux disease without esophagitis: Secondary | ICD-10-CM | POA: Diagnosis not present

## 2019-07-12 DIAGNOSIS — C7951 Secondary malignant neoplasm of bone: Secondary | ICD-10-CM | POA: Diagnosis not present

## 2019-07-12 DIAGNOSIS — C7931 Secondary malignant neoplasm of brain: Secondary | ICD-10-CM | POA: Diagnosis not present

## 2019-07-12 DIAGNOSIS — Z5111 Encounter for antineoplastic chemotherapy: Secondary | ICD-10-CM | POA: Diagnosis present

## 2019-07-12 DIAGNOSIS — D649 Anemia, unspecified: Secondary | ICD-10-CM | POA: Diagnosis not present

## 2019-07-12 LAB — CBC WITH DIFFERENTIAL/PLATELET
Abs Immature Granulocytes: 0.02 10*3/uL (ref 0.00–0.07)
Basophils Absolute: 0 10*3/uL (ref 0.0–0.1)
Basophils Relative: 0 %
Eosinophils Absolute: 0.1 10*3/uL (ref 0.0–0.5)
Eosinophils Relative: 2 %
HCT: 31.6 % — ABNORMAL LOW (ref 39.0–52.0)
Hemoglobin: 10.1 g/dL — ABNORMAL LOW (ref 13.0–17.0)
Immature Granulocytes: 1 %
Lymphocytes Relative: 18 %
Lymphs Abs: 0.6 10*3/uL — ABNORMAL LOW (ref 0.7–4.0)
MCH: 30.1 pg (ref 26.0–34.0)
MCHC: 32 g/dL (ref 30.0–36.0)
MCV: 94 fL (ref 80.0–100.0)
Monocytes Absolute: 0.5 10*3/uL (ref 0.1–1.0)
Monocytes Relative: 14 %
Neutro Abs: 2.3 10*3/uL (ref 1.7–7.7)
Neutrophils Relative %: 65 %
Platelets: 255 10*3/uL (ref 150–400)
RBC: 3.36 MIL/uL — ABNORMAL LOW (ref 4.22–5.81)
RDW: 13.9 % (ref 11.5–15.5)
WBC: 3.5 10*3/uL — ABNORMAL LOW (ref 4.0–10.5)
nRBC: 0.6 % — ABNORMAL HIGH (ref 0.0–0.2)

## 2019-07-12 LAB — COMPREHENSIVE METABOLIC PANEL
ALT: 14 U/L (ref 0–44)
AST: 22 U/L (ref 15–41)
Albumin: 3.6 g/dL (ref 3.5–5.0)
Alkaline Phosphatase: 86 U/L (ref 38–126)
Anion gap: 12 (ref 5–15)
BUN: 20 mg/dL (ref 6–20)
CO2: 27 mmol/L (ref 22–32)
Calcium: 10.3 mg/dL (ref 8.9–10.3)
Chloride: 97 mmol/L — ABNORMAL LOW (ref 98–111)
Creatinine, Ser: 0.64 mg/dL (ref 0.61–1.24)
GFR calc Af Amer: >60 mL/min (ref >60)
GFR calc non Af Amer: >60 mL/min (ref >60)
Glucose, Bld: 112 mg/dL — ABNORMAL HIGH (ref 70–99)
Potassium: 3.9 mmol/L (ref 3.5–5.1)
Sodium: 136 mmol/L (ref 135–145)
Total Bilirubin: 0.4 mg/dL (ref 0.3–1.2)
Total Protein: 8.8 g/dL — ABNORMAL HIGH (ref 6.5–8.1)

## 2019-07-12 LAB — MAGNESIUM: Magnesium: 1.7 mg/dL (ref 1.7–2.4)

## 2019-07-12 NOTE — Progress Notes (Signed)
Brandon Ferguson, Brandon Ferguson [id number]   CLINIC:  Medical Oncology/Hematology  PCP:  Patient, No Pcp Per No address on file None   REASON FOR VISIT:  Metastatic adenocarcinoma of the lung.  CURRENT THERAPY: Carboplatin, pembrolizumab and pemetrexed.  BRIEF ONCOLOGIC HISTORY:  Oncology History  Malignant neoplasm of right upper lobe of lung ([place])  05/02/2019 Initial Diagnosis   Malignant neoplasm of upper lobe of right lung ([place])   05/19/2019 Cancer Staging   Staging form: Lung, AJCC 8th Edition - Clinical stage from 05/19/2019: Stage IVB (cT2b, cN1, pM1c) - Signed by Derek Jack, MD on 05/19/2019   07/05/2019 -  Chemotherapy   The patient had palonosetron (ALOXI) injection 0.25 mg, 0.25 mg, Intravenous,  Once, 1 of 4 cycles Administration: 0.25 mg (07/05/2019) PEMEtrexed (ALIMTA) 900 mg in sodium chloride 0.9 % 100 mL chemo infusion, 500 mg/m2 = 900 mg, Intravenous,  Once, 1 of 6 cycles Administration: 900 mg (07/05/2019) CARBOplatin (PARAPLATIN) 580 mg in sodium chloride 0.9 % 250 mL chemo infusion, 580 mg (100 % of original dose 581.5 mg), Intravenous,  Once, 1 of 4 cycles Dose modification:   (original dose 581.5 mg, Cycle 1) Administration: 580 mg (07/05/2019) fosaprepitant (EMEND) 150 mg in sodium chloride 0.9 % 145 mL IVPB, 150 mg, Intravenous,  Once, 1 of 4 cycles Administration: 150 mg (07/05/2019) pembrolizumab (KEYTRUDA) 200 mg in sodium chloride 0.9 % 50 mL chemo infusion, 200 mg, Intravenous, Once, 1 of 6 cycles Administration: 200 mg (07/05/2019)  for chemotherapy treatment.       CANCER STAGING: Cancer Staging Malignant neoplasm of right upper lobe of lung [facility]) Staging form: Lung, AJCC 8th Edition - Clinical stage from 05/19/2019: Stage IVB (cT2b, cN1, pM1c) - Signed by Derek Jack, MD on 05/19/2019    INTERVAL HISTORY:  [name] 59 y.o. 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  He received first cycle of chemotherapy on 07/05/2019.  He reported some nausea but denied any vomiting.  He is complaining of pain in the lower back region.  He has not started taking tramadol yet.  Appetite and energy levels are 75%.  Pain in the back is rated as 10 out of 10.  Occasional dizziness on standing was reported.   REVIEW OF SYSTEMS:  Review of Systems  Gastrointestinal: Positive for nausea.  Musculoskeletal:       Low back pain, right mid thigh and right knee pain.  Neurological: Positive for dizziness.  All other systems reviewed and are negative.    PAST MEDICAL/SURGICAL HISTORY:  Past Medical History:  Diagnosis Date  . Cancer ([place])   . GERD (gastroesophageal reflux disease)   . Iron deficiency   . Port-A-Cath in place 06/17/2019  . Vitamin D deficiency    Past Surgical History:  Procedure Laterality Date  . KNEE SURGERY    . PORTACATH PLACEMENT Left 06/29/2019   Procedure: INSERTION PORT-A-CATH;  Surgeon: Aviva Signs, MD;  Location: [facility];  Service: General;  Laterality: Left;  [name] Kitchen VIDEO BRONCHOSCOPY Bilateral 04/29/2019   Procedure: VIDEO BRONCHOSCOPY WITH FLUORO;  Surgeon: Laurin Coder, MD;  Location: [facility];  Service: Endoscopy;  Laterality: Bilateral;  . VIDEO BRONCHOSCOPY Bilateral 05/03/2019   Procedure: VIDEO BRONCHOSCOPY WITHOUT FLUORO;  Surgeon: Rigoberto Noel, MD;  Location: [facility];  Service: Cardiopulmonary;  Laterality: Bilateral;     SOCIAL HISTORY:  Social History   Socioeconomic History  . Marital  status: Single    Spouse name: Not on file  . Number of children: Not on file  . Years of education: Not on file  . Highest education level: Not on file  Occupational History  . Not on file  Tobacco Use  . Smoking status: Current Every Day Smoker    Packs/day: 1.00    Types: Cigarettes  . Smokeless tobacco: Never Used  . Tobacco comment: quit cigs 5 months ago  Substance and Sexual Activity  . Alcohol  use: Yes    Alcohol/week: 6.0 standard drinks    Types: 6 Cans of beer per week    Comment: pt drinks 3 large beers daily. hasnt had any in 5 months  . Drug use: Yes    Types: Marijuana    Comment: daily. as of Jan 18,2021 hasnt had any in 5 months  . Sexual activity: Not on file  Other Topics Concern  . Not on file  Social History Narrative  . Not on file   Social Determinants of Health   Financial Resource Strain: Low Risk   . Difficulty of Paying Living Expenses: Not very hard  Food Insecurity: No Food Insecurity  . Worried About Charity fundraiser in the Last Year: Never true  . Ran Out of Food in the Last Year: Never true  Transportation Needs: No Transportation Needs  . Lack of Transportation (Medical): No  . Lack of Transportation (Non-Medical): No  Physical Activity: Inactive  . Days of Exercise per Week: 0 days  . Minutes of Exercise per Session: 0 min  Stress: No Stress Concern Present  . Feeling of Stress : Only a little  Social Connections: Somewhat Isolated  . Frequency of Communication with Friends and Family: More than three times a week  . Frequency of Social Gatherings with Friends and Family: More than three times a week  . Attends Religious Services: 1 to 4 times per year  . Active Member of Clubs or Organizations: No  . Attends Archivist Meetings: Never  . Marital Status: Never married  Intimate Partner Violence: Not At Risk  . Fear of Current or Ex-Partner: No  . Emotionally Abused: No  . Physically Abused: No  . Sexually Abused: No    FAMILY HISTORY:  Family History  Problem Relation Age of Onset  . Breast cancer Mother   . Cirrhosis Father   . Alcohol abuse Father   . Hypertension Sister   . Cancer Brother   . Hypertension Sister   . Hypertension Sister   . Hypertension Sister   . Hypertension Sister     CURRENT MEDICATIONS:  Outpatient Encounter Medications as of 07/12/2019  Medication Sig  . CARBOPLATIN IV Inject into the  vein every 21 ( twenty-one) days. Every 3 weeks x 4 cycles  . ferrous sulfate 325 (65 FE) MG EC tablet Take 325 mg by mouth daily.  . folic acid (FOLVITE) 1 MG tablet Take 1 tablet (1 mg total) by mouth daily.  [name] Kitchen levETIRAcetam (KEPPRA) 500 MG tablet Take 1 tablet (500 mg total) by mouth 2 (two) times daily.  [name] Kitchen PEMBROLIZUMAB IV Inject into the vein every 21 ( twenty-one) days.  [name] Kitchen PEMEtrexed 500 mg/m2 in sodium chloride 0.9 % 100 mL Inject 500 mg/m2 into the vein every 21 ( twenty-one) days. Every 3 weeks x 4 cycles  . traMADol (ULTRAM) 50 MG tablet Take 1 tablet (50 mg total) by mouth at bedtime as needed.  . [DISCONTINUED] Multiple Vitamin (MULTIVITAMIN  WITH MINERALS) TABS tablet Take 1 tablet by mouth daily.  . [DISCONTINUED] thiamine 100 MG tablet Take 1 tablet (100 mg total) by mouth daily.  [name] Kitchen acetaminophen (TYLENOL) 500 MG tablet Take 1,000 mg by mouth every 6 (six) hours as needed for moderate pain or headache.   . lidocaine-prilocaine (EMLA) cream Apply a small amount to port a cath site and cover with plastic wrap 1 hour prior to chemotherapy appointments (Patient not taking: Reported on 07/05/2019)  . polyethylene glycol (MIRALAX / GLYCOLAX) 17 g packet Take 17 g by mouth daily as needed. (Patient not taking: Reported on 07/05/2019)  . prochlorperazine (COMPAZINE) 10 MG tablet Take 1 tablet (10 mg total) by mouth every 6 (six) hours as needed (Nausea or vomiting). (Patient not taking: Reported on 07/05/2019)  . [DISCONTINUED] dexamethasone (DECADRON) 4 MG tablet Take 1 tablet (4 mg total) by mouth 2 (two) times daily.  . [DISCONTINUED] pantoprazole (PROTONIX) 40 MG tablet Take 1 tablet (40 mg total) by mouth daily.   No facility-administered encounter medications on file as of 07/12/2019.    ALLERGIES:  No Known Allergies   PHYSICAL EXAM:  ECOG Performance status: 1  Vitals:   07/12/19 1051  BP: 94/67  Pulse: (!) 102  Resp: 20  Temp: (!) 97.1 F (36.2 C)  SpO2: 99%   Filed  Weights   07/12/19 1051  Weight: 132 lb 14.4 oz (60.3 kg)    Physical Exam Vitals reviewed.  Constitutional:      Appearance: Normal appearance.  Cardiovascular:     Rate and Rhythm: Normal rate and regular rhythm.     Heart sounds: Normal heart sounds.  Pulmonary:     Effort: Pulmonary effort is normal.     Breath sounds: Normal breath sounds.  Abdominal:     General: There is no distension.     Palpations: Abdomen is soft. There is no mass.  Skin:    General: Skin is warm.  Neurological:     Mental Status: He is alert and oriented to person, place, and time.  Psychiatric:        Mood and Affect: Mood normal.        Behavior: Behavior normal.      LABORATORY DATA:  I have reviewed the labs as listed.  CBC    Component Value Date/Time   WBC 3.5 (L) 07/12/2019 1011   RBC 3.36 (L) 07/12/2019 1011   HGB 10.1 (L) 07/12/2019 1011   HCT 31.6 (L) 07/12/2019 1011   PLT 255 07/12/2019 1011   MCV 94.0 07/12/2019 1011   MCH 30.1 07/12/2019 1011   MCHC 32.0 07/12/2019 1011   RDW 13.9 07/12/2019 1011   LYMPHSABS 0.6 (L) 07/12/2019 1011   MONOABS 0.5 07/12/2019 1011   EOSABS 0.1 07/12/2019 1011   BASOSABS 0.0 07/12/2019 1011   CMP Latest Ref Rng & Units 07/12/2019 07/05/2019 06/27/2019  Glucose 70 - 99 mg/dL 112(H) 99 97  BUN 6 - 20 mg/dL 20 15 17   Creatinine 0.61 - 1.24 mg/dL 0.64 0.58(L) 0.66  Sodium 135 - 145 mmol/L 136 137 138  Potassium 3.5 - 5.1 mmol/L 3.9 4.0 3.5  Chloride 98 - 111 mmol/L 97(L) 101 103  CO2 22 - 32 mmol/L 27 26 26   Calcium 8.9 - 10.3 mg/dL 10.3 10.1 9.9  Total Protein 6.5 - 8.1 g/dL 8.8(H) 8.4(H) 8.3(H)  Total Bilirubin 0.3 - 1.2 mg/dL 0.4 0.7 0.7  Alkaline Phos 38 - 126 U/L 86 74 67  AST 15 -  41 U/L 22 18 23   ALT 0 - 44 U/L 14 13 23        DIAGNOSTIC IMAGING:  I have independently reviewed the scans and discussed with the patient.    ASSESSMENT & PLAN:   Malignant neoplasm of right upper lobe of lung ([place]) 1.  Metastatic adenocarcinoma  of the lung to the brain and bones: -PD-L1 TPS 20%, foundation 1 with no reportable alterations. -PET scan on 05/17/2019 showed right upper lobe hypermetabolic mass, 4.2 cm.  Right hilar lymph node 12 mm.  Extensive hypermetabolic osseous metastatic disease with right superior pubic ramus, anterior Staebler lesion, left sacral lesion and L4 vertebral body. -Biopsy of the right iliac bone lesion on 05/20/2019 consistent with adenocarcinoma. -Cycle 1 of carboplatin, pemetrexed and pembrolizumab on 07/05/2019. -He had experienced some nausea but denied any vomiting.  I have reviewed his CBC which showed mildly decreased white count.  LFTs were within normal limits. -She will come back in 2 weeks for follow-up.  2.  Brain metastasis: -Presentation with right arm weakness with brain MRI on 04/28/2019 showing widespread metastatic disease in the brain, approximately 22 lesions. -WB RT completed on 05/19/2019. -We will plan to order brain MRI at next visit.  3.  Back pain: -He completed radiation therapy to the bone metastasis on 06/14/2019. -He complains of pain.  He has not yet started taking tramadol. -I have sent a prescription for it a week ago.  He was told to take it twice daily as needed.  4.  Normocytic anemia: -Hemoglobin is 10.3.  Likely from recent radiation and chemotherapy. -We will consider checking ferritin, I71 and folic acid levels.    Orders placed this encounter:  Orders Placed This Encounter  Procedures  . CBC with Differential/Platelet  . Comprehensive metabolic panel  . Iron and TIBC  . Ferritin  . Vitamin B12  . Folate  . Magnesium     Derek Jack, MD [place] [phone]

## 2019-07-12 NOTE — Patient Instructions (Addendum)
[place] at [facility] Discharge Instructions  You were seen today by [name]. He went over your recent lab results. Start taking Tramadol twice a day for pain. Take your nausea medication every 6 hours for upset stomach. He will see you back in 2 weeks for labs, treatment and follow up.   Thank you for choosing [place] at [facility] to provide your oncology and hematology care.  To afford each patient quality time with our provider, please arrive at least 15 minutes before your scheduled appointment time.   If you have a lab appointment with the [facility] please come in thru the  Main Entrance and check in at the main information desk  You need to re-schedule your appointment should you arrive 10 or more minutes late.  We strive to give you quality time with our providers, and arriving late affects you and other patients whose appointments are after yours.  Also, if you no show three or more times for appointments you may be dismissed from the clinic at the providers discretion.     Again, thank you for choosing [facility].  Our hope is that these requests will decrease the amount of time that you wait before being seen by our physicians.       _____________________________________________________________  Should you have questions after your visit to [facility], please contact our office at [phone] between the hours of 8:00 a.m. and 4:30 p.m.  Voicemails left after 4:00 p.m. will not be returned until the following business day.  For prescription refill requests, have your pharmacy contact our office and allow 72 hours.    Cancer Center Support Programs:   > Cancer Support Group  2nd Tuesday of the month 1pm-2pm, Journey Room

## 2019-07-15 ENCOUNTER — Ambulatory Visit (HOSPITAL_COMMUNITY): Payer: Self-pay

## 2019-07-15 NOTE — Progress Notes (Signed)
Nutrition Follow-up:  Patient with non small cell lung cancer with metastatic disease to brain.  Patient currently receiving carboplatin, pembrolizumab and pemetrexed.    Spoke with patient via phone for nutrition follow-up.  Patient reports that his appetite is not that good.  Reports that he feels nauseated.  Has not taken any nausea medications.  Denies constipation or diarrhea.  Reports ate 1/2 steak biscuit around 9am this morning but nothing else today.      Medications: compazine  Labs: reviewed  Anthropometrics:   Weight 132 lb 14.4 oz on 2/2 decreased from 143 lb 1/6 oz on 1/7   NUTRITION DIAGNOSIS: Inadequate oral intake continues   INTERVENTION:  Discussed importance of taking nausea medications.  Patient verbalized understanding that he will take nausea medications. Will provide another complimentary case of ensure enlive for patient to pick up.   Patient has contact information    MONITORING, EVALUATION, GOAL: Patient will consume adequate calories and protein to maintain weight during treatment.     NEXT VISIT: phone f/u March 5th  Ollivander See B. Zenia Resides, [place], [place] Registered Dietitian [phone] (pager)

## 2019-07-16 ENCOUNTER — Encounter (HOSPITAL_COMMUNITY): Payer: Self-pay | Admitting: Hematology

## 2019-07-16 NOTE — Assessment & Plan Note (Signed)
1.  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 -PET scan on 05/17/2019 showed right upper lobe hypermetabolic mass, 4.2 cm.  Right hilar lymph node 12 mm.  Extensive hypermetabolic osseous metastatic disease with right superior pubic ramus, anterior Staebler lesion, left sacral lesion and L4 vertebral body. -Biopsy of the right iliac bone lesion on 05/20/2019 consistent with adenocarcinoma. -Cycle 1 of carboplatin, pemetrexed and pembrolizumab on 07/05/2019. -He had experienced some nausea but denied any vomiting.  I have reviewed his CBC which showed mildly decreased white count.  LFTs were within normal limits. -She will come back in 2 weeks for follow-up.  2.  Brain metastasis: -Presentation with right arm weakness with brain MRI on 04/28/2019 showing widespread metastatic disease in the brain, approximately 22 lesions. -WB RT completed on 05/19/2019. -We will plan to order brain MRI at next visit.  3.  Back pain: -He completed radiation therapy to the bone metastasis on 06/14/2019. -He complains of pain.  He has not yet started taking tramadol. -I have sent a prescription for it a week ago.  He was told to take it twice daily as needed.  4.  Normocytic anemia: -Hemoglobin is 10.3.  Likely from recent radiation and chemotherapy. -We will consider checking ferritin, U27 and folic acid levels.

## 2019-07-26 ENCOUNTER — Other Ambulatory Visit (HOSPITAL_COMMUNITY): Payer: Self-pay | Admitting: *Deleted

## 2019-07-26 ENCOUNTER — Other Ambulatory Visit: Payer: Self-pay

## 2019-07-26 ENCOUNTER — Inpatient Hospital Stay (HOSPITAL_COMMUNITY): Payer: Medicaid Other

## 2019-07-26 ENCOUNTER — Inpatient Hospital Stay (HOSPITAL_BASED_OUTPATIENT_CLINIC_OR_DEPARTMENT_OTHER): Payer: Medicaid Other | Admitting: Hematology

## 2019-07-26 ENCOUNTER — Encounter (HOSPITAL_COMMUNITY): Payer: Self-pay | Admitting: Hematology

## 2019-07-26 VITALS — BP 100/62 | HR 71 | Temp 97.7°F | Resp 20

## 2019-07-26 VITALS — BP 107/65 | HR 95 | Temp 97.1°F | Resp 18 | Wt 135.4 lb

## 2019-07-26 DIAGNOSIS — C3411 Malignant neoplasm of upper lobe, right bronchus or lung: Secondary | ICD-10-CM

## 2019-07-26 DIAGNOSIS — Z95828 Presence of other vascular implants and grafts: Secondary | ICD-10-CM

## 2019-07-26 DIAGNOSIS — Z5112 Encounter for antineoplastic immunotherapy: Secondary | ICD-10-CM | POA: Diagnosis not present

## 2019-07-26 LAB — CBC WITH DIFFERENTIAL/PLATELET
Abs Immature Granulocytes: 0.02 10*3/uL (ref 0.00–0.07)
Basophils Absolute: 0 10*3/uL (ref 0.0–0.1)
Basophils Relative: 1 %
Eosinophils Absolute: 0.1 10*3/uL (ref 0.0–0.5)
Eosinophils Relative: 1 %
HCT: 28.3 % — ABNORMAL LOW (ref 39.0–52.0)
Hemoglobin: 8.9 g/dL — ABNORMAL LOW (ref 13.0–17.0)
Immature Granulocytes: 1 %
Lymphocytes Relative: 20 %
Lymphs Abs: 0.8 10*3/uL (ref 0.7–4.0)
MCH: 30.5 pg (ref 26.0–34.0)
MCHC: 31.4 g/dL (ref 30.0–36.0)
MCV: 96.9 fL (ref 80.0–100.0)
Monocytes Absolute: 0.7 10*3/uL (ref 0.1–1.0)
Monocytes Relative: 17 %
Neutro Abs: 2.5 10*3/uL (ref 1.7–7.7)
Neutrophils Relative %: 60 %
Platelets: 283 10*3/uL (ref 150–400)
RBC: 2.92 MIL/uL — ABNORMAL LOW (ref 4.22–5.81)
RDW: 14.7 % (ref 11.5–15.5)
WBC: 4.1 10*3/uL (ref 4.0–10.5)
nRBC: 0 % (ref 0.0–0.2)

## 2019-07-26 LAB — COMPREHENSIVE METABOLIC PANEL
ALT: 13 U/L (ref 0–44)
AST: 18 U/L (ref 15–41)
Albumin: 3.6 g/dL (ref 3.5–5.0)
Alkaline Phosphatase: 114 U/L (ref 38–126)
Anion gap: 12 (ref 5–15)
BUN: 15 mg/dL (ref 6–20)
CO2: 25 mmol/L (ref 22–32)
Calcium: 10 mg/dL (ref 8.9–10.3)
Chloride: 102 mmol/L (ref 98–111)
Creatinine, Ser: 0.68 mg/dL (ref 0.61–1.24)
GFR calc Af Amer: >60 mL/min (ref >60)
GFR calc non Af Amer: >60 mL/min (ref >60)
Glucose, Bld: 119 mg/dL — ABNORMAL HIGH (ref 70–99)
Potassium: 3.4 mmol/L — ABNORMAL LOW (ref 3.5–5.1)
Sodium: 139 mmol/L (ref 135–145)
Total Bilirubin: 0.4 mg/dL (ref 0.3–1.2)
Total Protein: 8.2 g/dL — ABNORMAL HIGH (ref 6.5–8.1)

## 2019-07-26 LAB — FOLATE: Folate: 21.7 ng/mL (ref >5.9)

## 2019-07-26 LAB — IRON AND TIBC
Iron: 33 ug/dL — ABNORMAL LOW (ref 45–182)
Saturation Ratios: 11 % — ABNORMAL LOW (ref 17.9–39.5)
TIBC: 300 ug/dL (ref 250–450)
UIBC: 267 ug/dL

## 2019-07-26 LAB — MAGNESIUM: Magnesium: 1.6 mg/dL — ABNORMAL LOW (ref 1.7–2.4)

## 2019-07-26 LAB — VITAMIN B12: Vitamin B-12: 2950 pg/mL — ABNORMAL HIGH (ref 180–914)

## 2019-07-26 LAB — FERRITIN: Ferritin: 631 ng/mL — ABNORMAL HIGH (ref 24–336)

## 2019-07-26 MED ORDER — HEPARIN SOD (PORK) LOCK FLUSH 100 UNIT/ML IV SOLN
500.0000 [IU] | Freq: Once | INTRAVENOUS | Status: AC | PRN
  Administered 2019-07-26: 500 [IU]

## 2019-07-26 MED ORDER — SODIUM CHLORIDE 0.9 % IV SOLN
150.0000 mg | Freq: Once | INTRAVENOUS | Status: AC
  Administered 2019-07-26: 150 mg via INTRAVENOUS
  Filled 2019-07-26: qty 150

## 2019-07-26 MED ORDER — SODIUM CHLORIDE 0.9 % IV SOLN
581.5000 mg | Freq: Once | INTRAVENOUS | Status: AC
  Administered 2019-07-26: 580 mg via INTRAVENOUS
  Filled 2019-07-26: qty 58

## 2019-07-26 MED ORDER — SODIUM CHLORIDE 0.9 % IV SOLN
200.0000 mg | Freq: Once | INTRAVENOUS | Status: AC
  Administered 2019-07-26: 200 mg via INTRAVENOUS
  Filled 2019-07-26: qty 8

## 2019-07-26 MED ORDER — SODIUM CHLORIDE 0.9 % IV SOLN
Freq: Once | INTRAVENOUS | Status: AC
  Filled 2019-07-26: qty 1000

## 2019-07-26 MED ORDER — SODIUM CHLORIDE 0.9 % IV SOLN
10.0000 mg | Freq: Once | INTRAVENOUS | Status: AC
  Administered 2019-07-26: 10 mg via INTRAVENOUS
  Filled 2019-07-26: qty 10

## 2019-07-26 MED ORDER — TRAMADOL HCL 50 MG PO TABS: 50.0000 mg | ORAL_TABLET | Freq: Every evening | ORAL | 0 refills | Status: DC | PRN

## 2019-07-26 MED ORDER — PALONOSETRON HCL INJECTION 0.25 MG/5ML
0.2500 mg | Freq: Once | INTRAVENOUS | Status: AC
  Administered 2019-07-26: 0.25 mg via INTRAVENOUS
  Filled 2019-07-26: qty 5

## 2019-07-26 MED ORDER — SODIUM CHLORIDE 0.9% FLUSH: 10.0000 mL | INTRAVENOUS | Status: DC | PRN

## 2019-07-26 MED ORDER — SODIUM CHLORIDE 0.9 % IV SOLN
500.0000 mg/m2 | Freq: Once | INTRAVENOUS | Status: AC
  Administered 2019-07-26: 900 mg via INTRAVENOUS
  Filled 2019-07-26: qty 16

## 2019-07-26 MED ORDER — SODIUM CHLORIDE 0.9 % IV SOLN: Freq: Once | INTRAVENOUS | Status: AC

## 2019-07-26 NOTE — Progress Notes (Signed)
Patient presents today for treatment and follow up visit with [name]. Labs pending. Vital signs within parameters for treatment today. Patient has no complaints of any significant changes since his last visit.   Labs within parameters for treatment.   Treatment given today per MD orders. Tolerated infusion without adverse affects. Vital signs stable. No complaints at this time. Discharged from clinic ambulatory. F/U with [facility] as scheduled.

## 2019-07-26 NOTE — Assessment & Plan Note (Signed)
1.  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 -PET scan on 05/17/2019 showed right upper lobe hypermetabolic mass, 4.2 cm.  Right hilar lymph node 12 mm.  Extensive hypermetabolic osseous metastatic disease with right superior pubic ramus, anterior Staebler lesion, left sacral lesion and L4 vertebral body. -Biopsy of the right iliac bone lesion on 05/20/2019 consistent with adenocarcinoma. -Cycle 1 of carboplatin, pemetrexed and pembrolizumab on 07/05/2019. -We reviewed his labs.  White count is normal with normal ANC.  LFTs are normal. -She will proceed with cycle 2 without any dose changes.  I will see him back in 3 weeks for follow-up.  I plan to repeat scans after cycle 3.  2.  Brain metastasis: -Presentation with right arm weakness with brain MRI on 04/28/2019 showing widespread metastatic disease in the brain, approximately 22 lesions. -WB RT completed on 05/19/2019. -We will plan for brain MRI with the neck scans.  3.  Back pain: -He completed XRT to the bone metastasis on 06/14/2019. -He is continuing to have pain.  He is taking tramadol at bedtime.  He was told to increase it to twice daily as needed.  4.  Normocytic anemia: -Hemoglobin today is 8.9.  Ferritin was elevated.  H57 and folic acid were normal. -This is from anemia from myelosuppression and chronic inflammation.  We will transfuse as needed.  5.  Electrolyte abnormalities: -He has hypomagnesemia with magnesium 1.6.  Potassium is 3.4. -We will give IV fluids with potassium and magnesium.

## 2019-07-26 NOTE — Patient Instructions (Signed)
[facility] Discharge Instructions for Patients Receiving Chemotherapy  Today you received the following chemotherapy agents   To help prevent nausea and vomiting after your treatment, we encourage you to take your nausea medication   If you develop nausea and vomiting that is not controlled by your nausea medication, call the clinic.   BELOW ARE SYMPTOMS THAT SHOULD BE REPORTED IMMEDIATELY:  *FEVER GREATER THAN 100.5 F  *CHILLS WITH OR WITHOUT FEVER  NAUSEA AND VOMITING THAT IS NOT CONTROLLED WITH YOUR NAUSEA MEDICATION  *UNUSUAL SHORTNESS OF BREATH  *UNUSUAL BRUISING OR BLEEDING  TENDERNESS IN MOUTH AND THROAT WITH OR WITHOUT PRESENCE OF ULCERS  *URINARY PROBLEMS  *BOWEL PROBLEMS  UNUSUAL RASH Items with * indicate a potential emergency and should be followed up as soon as possible.  Feel free to call the clinic should you have any questions or concerns. The clinic phone number is [phone].  Please show the CHEMO ALERT CARD at check-in to the Emergency Department and triage nurse.   

## 2019-07-26 NOTE — Progress Notes (Signed)
Patient has been assessed, vital signs and labs have been reviewed by [name]. ANC, Creatinine, LFTs, and Platelets are within treatment parameters per [name]. The patient is good to proceed with treatment at this time. Please also give fluids with electrolytes per [name].

## 2019-07-26 NOTE — Patient Instructions (Addendum)
[place] at [facility] Discharge Instructions  You were seen today by [name]. He went over your recent lab results. Be sure that you are taking your folic acid. He will see you back in 3 weeks for labs and follow up.   Thank you for choosing [place] at [facility] to provide your oncology and hematology care.  To afford each patient quality time with our provider, please arrive at least 15 minutes before your scheduled appointment time.   If you have a lab appointment with the [facility] please come in thru the  Main Entrance and check in at the main information desk  You need to re-schedule your appointment should you arrive 10 or more minutes late.  We strive to give you quality time with our providers, and arriving late affects you and other patients whose appointments are after yours.  Also, if you no show three or more times for appointments you may be dismissed from the clinic at the providers discretion.     Again, thank you for choosing [facility]  Our hope is that these requests will decrease the amount of time that you wait before being seen by our physicians.       _____________________________________________________________  Should you have questions after your visit to [facility], please contact our office at [phone] between the hours of 8:00 a.m. and 4:30 p.m.  Voicemails left after 4:00 p.m. will not be returned until the following business day.  For prescription refill requests, have your pharmacy contact our office and allow 72 hours.    Cancer Center Support Programs:   > Cancer Support Group  2nd Tuesday of the month 1pm-2pm, Journey Room

## 2019-07-26 NOTE — Addendum Note (Signed)
Addended by: Donetta Potts on: 07/26/2019 04:00 PM   Modules accepted: Orders

## 2019-07-26 NOTE — Progress Notes (Signed)
Brandon Ferguson, [place] [address]   CLINIC:  Medical Oncology/Hematology  PCP:  Patient, No Pcp Per No address on file None   REASON FOR VISIT:  Metastatic adenocarcinoma of the lung.  CURRENT THERAPY: Carboplatin, pembrolizumab and pemetrexed.  BRIEF ONCOLOGIC HISTORY:  Oncology History  Malignant neoplasm of right upper lobe of lung ([place])  05/02/2019 Initial Diagnosis   Malignant neoplasm of upper lobe of right lung ([place])   05/19/2019 Cancer Staging   Staging form: Lung, AJCC 8th Edition - Clinical stage from 05/19/2019: Stage IVB (cT2b, cN1, pM1c) - Signed by Derek Jack, MD on 05/19/2019   07/05/2019 -  Chemotherapy   The patient had palonosetron (ALOXI) injection 0.25 mg, 0.25 mg, Intravenous,  Once, 2 of 4 cycles Administration: 0.25 mg (07/05/2019), 0.25 mg (07/26/2019) PEMEtrexed (ALIMTA) 900 mg in sodium chloride 0.9 % 100 mL chemo infusion, 500 mg/m2 = 900 mg, Intravenous,  Once, 2 of 6 cycles Administration: 900 mg (07/05/2019), 900 mg (07/26/2019) CARBOplatin (PARAPLATIN) 580 mg in sodium chloride 0.9 % 250 mL chemo infusion, 580 mg (100 % of original dose 581.5 mg), Intravenous,  Once, 2 of 4 cycles Dose modification:   (original dose 581.5 mg, Cycle 1),   (original dose 581.5 mg, Cycle 2) Administration: 580 mg (07/05/2019), 580 mg (07/26/2019) fosaprepitant (EMEND) 150 mg in sodium chloride 0.9 % 145 mL IVPB, 150 mg, Intravenous,  Once, 2 of 4 cycles Administration: 150 mg (07/05/2019), 150 mg (07/26/2019) pembrolizumab (KEYTRUDA) 200 mg in sodium chloride 0.9 % 50 mL chemo infusion, 200 mg, Intravenous, Once, 2 of 6 cycles Administration: 200 mg (07/05/2019), 200 mg (07/26/2019)  for chemotherapy treatment.       CANCER STAGING: Cancer Staging Malignant neoplasm of right upper lobe of lung [facility]) Staging form: Lung, AJCC 8th Edition - Clinical stage from 05/19/2019: Stage IVB (cT2b, cN1, pM1c) - Signed by Derek Jack, MD on 05/19/2019    INTERVAL HISTORY:  Brandon Ferguson 59 y.o. 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  First cycle of chemotherapy was on 07/05/2019.  Appetite and energy levels are 100%.  Continues to have back pain and is taking tramadol at bedtime.  Had mild nausea but denied any vomiting.  Compazine helps with nausea.  Occasional dizziness is stable.  REVIEW OF SYSTEMS:  Review of Systems  Gastrointestinal: Positive for nausea.  Musculoskeletal:       Low back pain, right mid thigh and right knee pain.  Neurological: Positive for dizziness.  All other systems reviewed and are negative.    PAST MEDICAL/SURGICAL HISTORY:  Past Medical History:  Diagnosis Date  . Cancer ([place])   . GERD (gastroesophageal reflux disease)   . Iron deficiency   . Port-A-Cath in place 06/17/2019  . Vitamin D deficiency    Past Surgical History:  Procedure Laterality Date  . KNEE SURGERY    . PORTACATH PLACEMENT Left 06/29/2019   Procedure: INSERTION PORT-A-CATH;  Surgeon: Aviva Signs, MD;  Location: [facility];  Service: General;  Laterality: Left;  [name] Kitchen VIDEO BRONCHOSCOPY Bilateral 04/29/2019   Procedure: VIDEO BRONCHOSCOPY WITH FLUORO;  Surgeon: Laurin Coder, MD;  Location: [facility];  Service: Endoscopy;  Laterality: Bilateral;  . VIDEO BRONCHOSCOPY Bilateral 05/03/2019   Procedure: VIDEO BRONCHOSCOPY WITHOUT FLUORO;  Surgeon: Rigoberto Noel, MD;  Location: [facility];  Service: Cardiopulmonary;  Laterality: Bilateral;     SOCIAL HISTORY:  Social History   Socioeconomic  History  . Marital status: Single    Spouse name: Not on file  . Number of children: Not on file  . Years of education: Not on file  . Highest education level: Not on file  Occupational History  . Not on file  Tobacco Use  . Smoking status: Current Every Day Smoker    Packs/day: 1.00    Types: Cigarettes  . Smokeless tobacco: Never Used  . Tobacco comment:  quit cigs 5 months ago  Substance and Sexual Activity  . Alcohol use: Yes    Alcohol/week: 6.0 standard drinks    Types: 6 Cans of beer per week    Comment: pt drinks 3 large beers daily. hasnt had any in 5 months  . Drug use: Yes    Types: Marijuana    Comment: daily. as of Jan 18,2021 hasnt had any in 5 months  . Sexual activity: Not on file  Other Topics Concern  . Not on file  Social History Narrative  . Not on file   Social Determinants of Health   Financial Resource Strain: Low Risk   . Difficulty of Paying Living Expenses: Not very hard  Food Insecurity: No Food Insecurity  . Worried About Charity fundraiser in the Last Year: Never true  . Ran Out of Food in the Last Year: Never true  Transportation Needs: No Transportation Needs  . Lack of Transportation (Medical): No  . Lack of Transportation (Non-Medical): No  Physical Activity: Inactive  . Days of Exercise per Week: 0 days  . Minutes of Exercise per Session: 0 min  Stress: No Stress Concern Present  . Feeling of Stress : Only a little  Social Connections: Somewhat Isolated  . Frequency of Communication with Friends and Family: More than three times a week  . Frequency of Social Gatherings with Friends and Family: More than three times a week  . Attends Religious Services: 1 to 4 times per year  . Active Member of Clubs or Organizations: No  . Attends Archivist Meetings: Never  . Marital Status: Never married  Intimate Partner Violence: Not At Risk  . Fear of Current or Ex-Partner: No  . Emotionally Abused: No  . Physically Abused: No  . Sexually Abused: No    FAMILY HISTORY:  Family History  Problem Relation Age of Onset  . Breast cancer Mother   . Cirrhosis Father   . Alcohol abuse Father   . Hypertension Sister   . Cancer Brother   . Hypertension Sister   . Hypertension Sister   . Hypertension Sister   . Hypertension Sister     CURRENT MEDICATIONS:  Outpatient Encounter Medications  as of 07/26/2019  Medication Sig  . CARBOPLATIN IV Inject into the vein every 21 ( twenty-one) days. Every 3 weeks x 4 cycles  . ferrous sulfate 325 (65 FE) MG EC tablet Take 325 mg by mouth daily.  . folic acid (FOLVITE) 1 MG tablet Take 1 tablet (1 mg total) by mouth daily.  [name] Kitchen levETIRAcetam (KEPPRA) 500 MG tablet Take 1 tablet (500 mg total) by mouth 2 (two) times daily.  [name] Kitchen PEMBROLIZUMAB IV Inject into the vein every 21 ( twenty-one) days.  [name] Kitchen PEMEtrexed 500 mg/m2 in sodium chloride 0.9 % 100 mL Inject 500 mg/m2 into the vein every 21 ( twenty-one) days. Every 3 weeks x 4 cycles  . [DISCONTINUED] traMADol (ULTRAM) 50 MG tablet Take 1 tablet (50 mg total) by mouth at bedtime as needed.  [name] Kitchen  acetaminophen (TYLENOL) 500 MG tablet Take 1,000 mg by mouth every 6 (six) hours as needed for moderate pain or headache.   . lidocaine-prilocaine (EMLA) cream Apply a small amount to port a cath site and cover with plastic wrap 1 hour prior to chemotherapy appointments (Patient not taking: Reported on 07/05/2019)  . polyethylene glycol (MIRALAX / GLYCOLAX) 17 g packet Take 17 g by mouth daily as needed. (Patient not taking: Reported on 07/05/2019)  . prochlorperazine (COMPAZINE) 10 MG tablet Take 1 tablet (10 mg total) by mouth every 6 (six) hours as needed (Nausea or vomiting). (Patient not taking: Reported on 07/05/2019)   No facility-administered encounter medications on file as of 07/26/2019.    ALLERGIES:  No Known Allergies   PHYSICAL EXAM:  ECOG Performance status: 1  Vitals:   07/26/19 0854  BP: 107/65  Pulse: 95  Resp: 18  Temp: (!) 97.1 F (36.2 C)  SpO2: 100%   Filed Weights   07/26/19 0854  Weight: 135 lb 6.4 oz (61.4 kg)    Physical Exam Vitals reviewed.  Constitutional:      Appearance: Normal appearance.  Cardiovascular:     Rate and Rhythm: Normal rate and regular rhythm.     Heart sounds: Normal heart sounds.  Pulmonary:     Effort: Pulmonary effort is normal.     Breath  sounds: Normal breath sounds.  Abdominal:     General: There is no distension.     Palpations: Abdomen is soft. There is no mass.  Skin:    General: Skin is warm.  Neurological:     Mental Status: He is alert and oriented to person, place, and time.  Psychiatric:        Mood and Affect: Mood normal.        Behavior: Behavior normal.      LABORATORY DATA:  I have reviewed the labs as listed.  CBC    Component Value Date/Time   WBC 4.1 07/26/2019 0906   RBC 2.92 (L) 07/26/2019 0906   HGB 8.9 (L) 07/26/2019 0906   HCT 28.3 (L) 07/26/2019 0906   PLT 283 07/26/2019 0906   MCV 96.9 07/26/2019 0906   MCH 30.5 07/26/2019 0906   MCHC 31.4 07/26/2019 0906   RDW 14.7 07/26/2019 0906   LYMPHSABS 0.8 07/26/2019 0906   MONOABS 0.7 07/26/2019 0906   EOSABS 0.1 07/26/2019 0906   BASOSABS 0.0 07/26/2019 0906   CMP Latest Ref Rng & Units 07/26/2019 07/12/2019 07/05/2019  Glucose 70 - 99 mg/dL 119(H) 112(H) 99  BUN 6 - 20 mg/dL 15 20 15   Creatinine 0.61 - 1.24 mg/dL 0.68 0.64 0.58(L)  Sodium 135 - 145 mmol/L 139 136 137  Potassium 3.5 - 5.1 mmol/L 3.4(L) 3.9 4.0  Chloride 98 - 111 mmol/L 102 97(L) 101  CO2 22 - 32 mmol/L 25 27 26   Calcium 8.9 - 10.3 mg/dL 10.0 10.3 10.1  Total Protein 6.5 - 8.1 g/dL 8.2(H) 8.8(H) 8.4(H)  Total Bilirubin 0.3 - 1.2 mg/dL 0.4 0.4 0.7  Alkaline Phos 38 - 126 U/L 114 86 74  AST 15 - 41 U/L 18 22 18   ALT 0 - 44 U/L 13 14 13        DIAGNOSTIC IMAGING:  I have independently reviewed the scans and discussed with the patient.    ASSESSMENT & PLAN:   Malignant neoplasm of right upper lobe of lung ([place]) 1.  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 -PET scan on 05/17/2019 showed right upper lobe hypermetabolic mass, 4.2 cm.  Right hilar lymph node 12 mm.  Extensive hypermetabolic osseous metastatic disease with right superior pubic ramus, anterior Staebler lesion, left sacral  lesion and L4 vertebral body. -Biopsy of the right iliac bone lesion on 05/20/2019 consistent with adenocarcinoma. -Cycle 1 of carboplatin, pemetrexed and pembrolizumab on 07/05/2019. -We reviewed his labs.  White count is normal with normal ANC.  LFTs are normal. -She will proceed with cycle 2 without any dose changes.  I will see him back in 3 weeks for follow-up.  I plan to repeat scans after cycle 3.  2.  Brain metastasis: -Presentation with right arm weakness with brain MRI on 04/28/2019 showing widespread metastatic disease in the brain, approximately 22 lesions. -WB RT completed on 05/19/2019. -We will plan for brain MRI with the neck scans.  3.  Back pain: -He completed XRT to the bone metastasis on 06/14/2019. -He is continuing to have pain.  He is taking tramadol at bedtime.  He was told to increase it to twice daily as needed.  4.  Normocytic anemia: -Hemoglobin today is 8.9.  Ferritin was elevated.  G83 and folic acid were normal. -This is from anemia from myelosuppression and chronic inflammation.  We will transfuse as needed.  5.  Electrolyte abnormalities: -He has hypomagnesemia with magnesium 1.6.  Potassium is 3.4. -We will give IV fluids with potassium and magnesium.    Orders placed this encounter:  Orders Placed This Encounter  Procedures  . CBC with Differential/Platelet  . Comprehensive metabolic panel  . Magnesium     Derek Jack, MD [place] [phone]

## 2019-07-27 MED ORDER — TRAMADOL HCL 50 MG PO TABS: 50.0000 mg | ORAL_TABLET | Freq: Two times a day (BID) | ORAL | 0 refills | Status: DC | PRN

## 2019-07-29 NOTE — Progress Notes (Signed)
  Radiation Oncology         (336) [phone] ________________________________  Name: Brandon Ferguson MRN: [id number]  Date: 06/14/2019  DOB: [birth year]  End of Treatment Note  Diagnosis:   Stage IV lung cancer     Indication for treatment::  palliative       Radiation treatment dates:   05/30/19 - 06/14/19  Site/dose:    1.  The right lung was treated to a dose of 30 Gy in 10 fractions using a 5-field 3D conformal technique.  2.  The right pelvis was treated to a dose of 30 Gy in 10 fractions using a 3-field 3D conformal technique.  3.   The L-spine centered at L4 was treated to a dose of 30 Gy in 10 fractions using a3-field 3D conformal technique.  Narrative: The patient tolerated radiation treatment relatively well.     Plan: The patient has completed radiation treatment. The patient will return to radiation oncology clinic for routine followup in one month. I advised the patient to call or return sooner if they have any questions or concerns related to their recovery or treatment. ________________________________  Jodelle Gross, M.D., Ph.D.

## 2019-07-29 NOTE — Progress Notes (Signed)
  Radiation Oncology         [phone] ________________________________  Name: Brandon Ferguson MRN: [id number]  Date: 05/19/2019  DOB: 04-25-1961  End of Treatment Note  Diagnosis:   brain metastasis   Indication for treatment::  palliative       Radiation treatment dates:   05/02/19 - 05/19/19  Site/dose:   The patient was treated with a course of whole brain radiation therapy to a dose of 30 Gy in 10 fractions.  This was accomplished using a 2 field technique with additional reduced fields as necessary to improve dose homogeneity.  Narrative: The patient tolerated radiation treatment relatively well.   No unexpected difficulties in terms of acute toxicity.  The skin held up to treatment fairly well.  Plan: The patient has completed radiation treatment. The patient will return to radiation oncology clinic for routine followup in one month. I advised the patient to call or return sooner if they have any questions or concerns related to their recovery or treatment. ________________________________  Jodelle Gross, M.D., Ph.D.

## 2019-08-11 ENCOUNTER — Ambulatory Visit (HOSPITAL_COMMUNITY)
Admission: RE | Admit: 2019-08-11 | Discharge: 2019-08-11 | Disposition: A | Discharge status: Home / Self Care | Payer: Medicaid Other | Source: Ambulatory Visit | Attending: Radiation Oncology | Admitting: Radiation Oncology

## 2019-08-11 ENCOUNTER — Other Ambulatory Visit: Payer: Self-pay

## 2019-08-11 DIAGNOSIS — C7949 Secondary malignant neoplasm of other parts of nervous system: Secondary | ICD-10-CM | POA: Insufficient documentation

## 2019-08-11 DIAGNOSIS — C7931 Secondary malignant neoplasm of brain: Secondary | ICD-10-CM | POA: Diagnosis present

## 2019-08-11 DIAGNOSIS — C7951 Secondary malignant neoplasm of bone: Secondary | ICD-10-CM | POA: Insufficient documentation

## 2019-08-11 MED ORDER — GADOBUTROL 1 MMOL/ML IV SOLN
6.5000 mL | Freq: Once | INTRAVENOUS | Status: AC | PRN
  Administered 2019-08-11: 6.5 mL via INTRAVENOUS

## 2019-08-11 NOTE — Progress Notes (Signed)
  Radiation Oncology         (336) [phone] ________________________________  Name: Brandon Ferguson MRN: [id number]  Date: 05/27/2019  DOB: [birth year]  SIMULATION AND TREATMENT PLANNING NOTE  DIAGNOSIS:     ICD-10-CM   1. Bone metastasis ([place])  C79.51      Site:   1.  Pelvis 2.  L-spine 3.  Right lung  NARRATIVE:  The patient was brought to the [facility].  Identity was confirmed.  All relevant records and images related to the planned course of therapy were reviewed.   Written consent to proceed with treatment was confirmed which was freely given after reviewing the details related to the planned course of therapy had been reviewed with the patient.  Then, the patient was set-up in a stable reproducible  supine position for radiation therapy.  CT images were obtained.  Surface markings were placed.    Medically necessary complex treatment device(s) for immobilization:  accuform device.   The CT images were loaded into the planning software.  Then the target and avoidance structures were contoured.  Treatment planning then occurred.  The radiation prescription was entered and confirmed.  A total of 11 complex treatment devices were fabricated which relate to the designed radiation treatment fields. Each of these customized fields/ complex treatment devices will be used on a daily basis during the radiation course. I have requested : 3D Simulation  I have requested a DVH of the following structures: target, lungs, cord.   The patient will undergo daily image guidance to ensure accurate localization of the target, and adequate minimize dose to the normal surrounding structures in close proximity to the target.   PLAN:  The patient will receive 30 Gy in 10 fractions.  ________________________________   Jodelle Gross, MD, PhD

## 2019-08-11 NOTE — Progress Notes (Signed)
  Radiation Oncology         (336) [phone] ________________________________  Name: Brandon Ferguson MRN: [id number]  Date: 04/29/2019  DOB: [birth year]  SIMULATION AND TREATMENT PLANNING NOTE  DIAGNOSIS:     ICD-10-CM   1. Metastatic cancer to brain [facility])  C79.31   2. Bone metastasis ([place])  C79.51   3. Malignant neoplasm of right upper lobe of lung ([facility])  C34.11      Site:   1.  Whole brain 2.  Right pelvis 3.  L-spine 4.  Right lung   NARRATIVE:  The patient was brought to the [facility].  Identity was confirmed.  All relevant records and images related to the planned course of therapy were reviewed.   Written consent to proceed with treatment was confirmed which was freely given after reviewing the details related to the planned course of therapy had been reviewed with the patient.  Then, the patient was set-up in a stable reproducible  supine position for radiation therapy.  CT images were obtained.  Surface markings were placed.    Medically necessary complex treatment device(s) for immobilization:  mask.   The CT images were loaded into the planning software.  Then the target and avoidance structures were contoured.  Treatment planning then occurred.  The radiation prescription was entered and confirmed.  A total of 14 complex treatment devices were fabricated which relate to the designed radiation treatment fields. Each of these customized fields/ complex treatment devices will be used on a daily basis during the radiation course. I have requested : 3D Simulation  I have requested a DVH of the following structures: lungs, cord, esophagus, target volume.    The patient will undergo daily image guidance to ensure accurate localization of the target, and adequate minimize dose to the normal surrounding structures in close proximity to the target.   PLAN:  The patient will receive 30 Gy in 10 fractions to each of the above separate target  areas.  ________________________________   Jodelle Gross, MD, PhD

## 2019-08-12 ENCOUNTER — Ambulatory Visit (HOSPITAL_COMMUNITY): Payer: Medicaid Other

## 2019-08-12 NOTE — Progress Notes (Signed)
Nutrition  Called patient several times during the day for nutrition follow-up.  No answer or option to leave voicemail.    Hardeep Reetz B. Zenia Resides, [place], [place] Registered Dietitian [phone] (pager)

## 2019-08-15 ENCOUNTER — Other Ambulatory Visit (HOSPITAL_COMMUNITY): Payer: Self-pay | Admitting: Hematology

## 2019-08-15 ENCOUNTER — Other Ambulatory Visit: Payer: Self-pay

## 2019-08-15 ENCOUNTER — Inpatient Hospital Stay: Payer: Medicaid Other | Attending: Radiation Oncology | Admitting: Internal Medicine

## 2019-08-15 ENCOUNTER — Telehealth: Payer: Self-pay | Admitting: Internal Medicine

## 2019-08-15 ENCOUNTER — Inpatient Hospital Stay: Payer: Medicaid Other

## 2019-08-15 VITALS — BP 114/74 | HR 101 | Temp 98.5°F | Resp 20 | Ht 72.0 in | Wt 133.5 lb

## 2019-08-15 DIAGNOSIS — C7931 Secondary malignant neoplasm of brain: Secondary | ICD-10-CM | POA: Insufficient documentation

## 2019-08-15 DIAGNOSIS — C3411 Malignant neoplasm of upper lobe, right bronchus or lung: Secondary | ICD-10-CM | POA: Diagnosis not present

## 2019-08-15 MED ORDER — LEVETIRACETAM 500 MG PO TABS: 500.0000 mg | ORAL_TABLET | Freq: Two times a day (BID) | ORAL | 3 refills | Status: DC

## 2019-08-15 NOTE — Progress Notes (Signed)
[facility] [place], [place] [address] [phone]   New Patient Evaluation  Date of Service: 08/15/19 Patient Name: Brandon Ferguson Patient MRN: [id number] Patient DOB: 12/09/1960 Provider: Ventura Sellers, MD  Identifying Statement:  Brandon Ferguson is a 59 y.o. 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    Primary Cancer:  Oncologic History: Oncology History  Malignant neoplasm of right upper lobe of lung ([place])  05/02/2019 Initial Diagnosis   Malignant neoplasm of upper lobe of right lung ([place])   05/19/2019 Cancer Staging   Staging form: Lung, AJCC 8th Edition - Clinical stage from 05/19/2019: Stage IVB (cT2b, cN1, pM1c) - Signed by Derek Jack, MD on 05/19/2019   07/05/2019 -  Chemotherapy   The patient had palonosetron (ALOXI) injection 0.25 mg, 0.25 mg, Intravenous,  Once, 2 of 4 cycles Administration: 0.25 mg (07/05/2019), 0.25 mg (07/26/2019) PEMEtrexed (ALIMTA) 900 mg in sodium chloride 0.9 % 100 mL chemo infusion, 500 mg/m2 = 900 mg, Intravenous,  Once, 2 of 6 cycles Administration: 900 mg (07/05/2019), 900 mg (07/26/2019) CARBOplatin (PARAPLATIN) 580 mg in sodium chloride 0.9 % 250 mL chemo infusion, 580 mg (100 % of original dose 581.5 mg), Intravenous,  Once, 2 of 4 cycles Dose modification:   (original dose 581.5 mg, Cycle 1),   (original dose 581.5 mg, Cycle 2) Administration: 580 mg (07/05/2019), 580 mg (07/26/2019) fosaprepitant (EMEND) 150 mg in sodium chloride 0.9 % 145 mL IVPB, 150 mg, Intravenous,  Once, 2 of 4 cycles Administration: 150 mg (07/05/2019), 150 mg (07/26/2019) pembrolizumab (KEYTRUDA) 200 mg in sodium chloride 0.9 % 50 mL chemo infusion, 200 mg, Intravenous, Once, 2 of 6 cycles Administration: 200 mg (07/05/2019), 200 mg (07/26/2019)  for chemotherapy treatment.     CNS Oncologic  History: 06/14/19: Completes WBRT with Dr. Lisbeth Renshaw  History of Present Illness: The patient's records from the referring physician were obtained and reviewed and the patient interviewed to confirm this HPI.  Brandon Ferguson presents to clinic for follow up after recent brain MRI study.  He completed his radiation ~2 months ago without complication.  He has not experienced any seizures since his initial event (right sided shaking for 8 minutes) several months ago.  Continues on Keppra twice per day.  Lives mostly alone, able to mostly manage finances but needs some help with that and his medications.  No longer working since cancer diagnosis.  Gets tired and dizzy on chemotherapy days and shortly thereafter.   Medications: Current Outpatient Medications on File Prior to Visit  Medication Sig Dispense Refill  . acetaminophen (TYLENOL) 500 MG tablet Take 1,000 mg by mouth every 6 (six) hours as needed for moderate pain or headache.     . CARBOPLATIN IV Inject into the vein every 21 ( twenty-one) days. Every 3 weeks x 4 cycles    . ferrous sulfate 325 (65 FE) MG EC tablet Take 325 mg by mouth daily.    . folic acid (FOLVITE) 1 MG tablet Take 1 tablet (1 mg total) by mouth daily. 30 tablet 3  . lidocaine-prilocaine (EMLA) cream Apply a small amount to port a cath site and cover with plastic wrap 1 hour prior to chemotherapy appointments (Patient not taking: Reported on 07/05/2019) 30 g 3  . PEMBROLIZUMAB IV Inject into the vein every 21 ( twenty-one) days.    [name] Kitchen PEMEtrexed 500 mg/m2 in sodium chloride  0.9 % 100 mL Inject 500 mg/m2 into the vein every 21 ( twenty-one) days. Every 3 weeks x 4 cycles    . polyethylene glycol (MIRALAX / GLYCOLAX) 17 g packet Take 17 g by mouth daily as needed. (Patient not taking: Reported on 07/05/2019) 14 each 0  . prochlorperazine (COMPAZINE) 10 MG tablet Take 1 tablet (10 mg total) by mouth every 6 (six) hours as needed (Nausea or vomiting). (Patient not taking: Reported on  07/05/2019) 30 tablet 1  . traMADol (ULTRAM) 50 MG tablet Take 1 tablet (50 mg total) by mouth every 12 (twelve) hours as needed. 60 tablet 0   No current facility-administered medications on file prior to visit.    Allergies: No Known Allergies Past Medical History:  Past Medical History:  Diagnosis Date  . Cancer ([place])   . GERD (gastroesophageal reflux disease)   . Iron deficiency   . Port-A-Cath in place 06/17/2019  . Vitamin D deficiency    Past Surgical History:  Past Surgical History:  Procedure Laterality Date  . KNEE SURGERY    . PORTACATH PLACEMENT Left 06/29/2019   Procedure: INSERTION PORT-A-CATH;  Surgeon: Aviva Signs, MD;  [facility]: [facility];  Service: General;  Laterality: Left;  [name] Kitchen VIDEO BRONCHOSCOPY Bilateral 04/29/2019   Procedure: VIDEO BRONCHOSCOPY WITH FLUORO;  Surgeon: Laurin Coder, MD;  [facility];  Service: Endoscopy;  Laterality: Bilateral;  . VIDEO BRONCHOSCOPY Bilateral 05/03/2019   Procedure: VIDEO BRONCHOSCOPY WITHOUT FLUORO;  Surgeon: Rigoberto Noel, MD;  Location: [facility];  Service: Cardiopulmonary;  Laterality: Bilateral;   Social History:  Social History   Socioeconomic History  . Marital status: Single    Spouse name: Not on file  . Number of children: Not on file  . Years of education: Not on file  . Highest education level: Not on file  Occupational History  . Not on file  Tobacco Use  . Smoking status: Current Every Day Smoker    Packs/day: 1.00    Types: Cigarettes  . Smokeless tobacco: Never Used  . Tobacco comment: quit cigs 5 months ago  Substance and Sexual Activity  . Alcohol use: Yes    Alcohol/week: 6.0 standard drinks    Types: 6 Cans of beer per week    Comment: pt drinks 3 large beers daily. hasnt had any in 5 months  . Drug use: Yes    Types: Marijuana    Comment: daily. as of Jan 18,2021 hasnt had any in 5 months  . Sexual activity: Not on file  Other Topics Concern  . Not on file  Social History  Narrative  . Not on file   Social Determinants of Health   Financial Resource Strain: Low Risk   . Difficulty of Paying Living Expenses: Not very hard  Food Insecurity: No Food Insecurity  . Worried About Charity fundraiser in the Last Year: Never true  . Ran Out of Food in the Last Year: Never true  Transportation Needs: No Transportation Needs  . Lack of Transportation (Medical): No  . Lack of Transportation (Non-Medical): No  Physical Activity: Inactive  . Days of Exercise per Week: 0 days  . Minutes of Exercise per Session: 0 min  Stress: No Stress Concern Present  . Feeling of Stress : Only a little  Social Connections: Somewhat Isolated  . Frequency of Communication with Friends and Family: More than three times a week  . Frequency of Social Gatherings with Friends and Family: More than three  times a week  . Attends Religious Services: 1 to 4 times per year  . Active Member of Clubs or Organizations: No  . Attends Archivist Meetings: Never  . Marital Status: Never married  Intimate Partner Violence: Not At Risk  . Fear of Current or Ex-Partner: No  . Emotionally Abused: No  . Physically Abused: No  . Sexually Abused: No   Family History:  Family History  Problem Relation Age of Onset  . Breast cancer Mother   . Cirrhosis Father   . Alcohol abuse Father   . Hypertension Sister   . Cancer Brother   . Hypertension Sister   . Hypertension Sister   . Hypertension Sister   . Hypertension Sister     Review of Systems: Constitutional: Doesn't report fevers, chills or abnormal weight loss Eyes: Doesn't report blurriness of vision Ears, nose, mouth, throat, and face: Doesn't report sore throat Respiratory: Doesn't report cough, dyspnea or wheezes Cardiovascular: Doesn't report palpitation, chest discomfort  Gastrointestinal:  Doesn't report nausea, constipation, diarrhea GU: Doesn't report incontinence Skin: Doesn't report skin rashes Neurological: Per  HPI Musculoskeletal: Doesn't report joint pain Behavioral/Psych: Doesn't report anxiety  Physical Exam: Vitals:   08/15/19 1135  BP: 114/74  Pulse: (!) 101  Resp: 20  Temp: 98.5 F (36.9 C)  SpO2: 100%   KPS: 80. General: Alert, cooperative, pleasant, in no acute distress Head: Normal EENT: No conjunctival injection or scleral icterus.  Lungs: Resp effort normal Cardiac: Regular rate Abdomen: Non-distended abdomen Skin: No rashes cyanosis or petechiae. Extremities: No clubbing or edema  Neurologic Exam: Mental Status: Awake, alert, attentive to examiner. Oriented to self and environment. Language is fluent with intact comprehension. Age advanced pyschomotor slowing Cranial Nerves: Visual acuity is grossly normal. Visual fields are full. Extra-ocular movements intact. No ptosis. Face is symmetric Motor: Tone and bulk are normal. Power is full in both arms and legs. Reflexes are symmetric, no pathologic reflexes present.  Sensory: Intact to light touch Gait: Normal.   Labs: I have reviewed the data as listed    Component Value Date/Time   NA 139 07/26/2019 0906   K 3.4 (L) 07/26/2019 0906   CL 102 07/26/2019 0906   CO2 25 07/26/2019 0906   GLUCOSE 119 (H) 07/26/2019 0906   BUN 15 07/26/2019 0906   CREATININE 0.68 07/26/2019 0906   CALCIUM 10.0 07/26/2019 0906   PROT 8.2 (H) 07/26/2019 0906   ALBUMIN 3.6 07/26/2019 0906   AST 18 07/26/2019 0906   ALT 13 07/26/2019 0906   ALKPHOS 114 07/26/2019 0906   BILITOT 0.4 07/26/2019 0906   GFRNONAA >60 07/26/2019 0906   GFRAA >60 07/26/2019 0906   Lab Results  Component Value Date   WBC 4.1 07/26/2019   NEUTROABS 2.5 07/26/2019   HGB 8.9 (L) 07/26/2019   HCT 28.3 (L) 07/26/2019   MCV 96.9 07/26/2019   PLT 283 07/26/2019    Imaging:  MR Brain W Wo Contrast  Result Date: 08/11/2019 CLINICAL DATA:  Metastatic adenocarcinoma of the lung. Whole brain radiation completed 05/19/2019. EXAM: MRI HEAD WITHOUT AND WITH  CONTRAST TECHNIQUE: Multiplanar, multiecho pulse sequences of the brain and surrounding structures were obtained without and with intravenous contrast. CONTRAST:  6.54mL GADAVIST GADOBUTROL 1 MMOL/ML IV SOLN COMPARISON:  MRI of the brain of [place] 192020 FINDINGS: Brain: No acute infarction, hemorrhage, hydrocephalus or extra-axial collection. Redemonstrated multiple enhancing lesions scattered throughout the brain parenchyma, including cerebellum, cortex and white matter, consistent with metastatic disease to  the brain. There has been an interval decrease of the size of nearly all identified lesions as well as the degree of vasogenic edema surrounding the largest lesions in the bilateral frontoparietal regions. There is also interval decrease in size of the transfalcine lobulated enhancing lesion in the parietal region. No new lesions identified. Vascular: Normal flow voids. Skull and upper cervical spine: Decreased T1 signal of the bone marrow of the visualized upper cervical spine without significant contrast enhancement. Marrow signal characteristics of the clivus and calvarium is preserved. Sinuses/Orbits: Mucous retention cyst is seen in the right maxillary sinus. The orbits are maintained. Other: None. IMPRESSION: Interval decrease in size of the multiple enhancing lesions scattered throughout the brain parenchyma, consistent with metastatic disease to the brain with decrease of vasogenic edema surrounding the largest lesions in the bilateral frontoparietal regions. No new lesion identified. Electronically Signed   By: Pedro Earls M.D.   On: 08/11/2019 16:00    [facility] Clinician Interpretation: I have personally reviewed the radiological images as listed.  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    He is  seizure free and should continue on Keppra 500mg  BID.  He should return to clinic in 3 months following next MRI brain for evaluation.  We spent twenty additional minutes teaching regarding the natural history, biology, and historical experience in the treatment of neurologic complications of cancer.   We appreciate the opportunity to participate in the care of Brandon Ferguson.   All questions were answered. The patient knows to call the clinic with any problems, questions or concerns. No barriers to learning were detected.  The total time spent in the encounter was 40 minutes and more than 50% was on counseling and review of test results   Ventura Sellers, MD Medical Director of Neuro-Oncology [facility] at [place] 08/15/19 3:12 PM

## 2019-08-15 NOTE — Telephone Encounter (Signed)
Scheduled appt per 3/8 los.  Was not able to reach patient.  Spoke with pt sister and she is aware of the appt date and time,.

## 2019-08-16 ENCOUNTER — Inpatient Hospital Stay (HOSPITAL_BASED_OUTPATIENT_CLINIC_OR_DEPARTMENT_OTHER): Payer: Medicaid Other | Admitting: Hematology

## 2019-08-16 ENCOUNTER — Encounter: Payer: Self-pay | Admitting: Pharmacy Technician

## 2019-08-16 ENCOUNTER — Inpatient Hospital Stay (HOSPITAL_COMMUNITY): Payer: Medicaid Other

## 2019-08-16 ENCOUNTER — Inpatient Hospital Stay (HOSPITAL_COMMUNITY): Payer: Medicaid Other | Attending: Hematology

## 2019-08-16 ENCOUNTER — Encounter (HOSPITAL_COMMUNITY): Payer: Self-pay | Admitting: Hematology

## 2019-08-16 VITALS — BP 109/73 | HR 88 | Temp 97.3°F | Resp 18 | Wt 134.0 lb

## 2019-08-16 VITALS — BP 99/65 | HR 65 | Temp 97.4°F | Resp 18

## 2019-08-16 DIAGNOSIS — E876 Hypokalemia: Secondary | ICD-10-CM

## 2019-08-16 DIAGNOSIS — C3411 Malignant neoplasm of upper lobe, right bronchus or lung: Secondary | ICD-10-CM | POA: Insufficient documentation

## 2019-08-16 DIAGNOSIS — M549 Dorsalgia, unspecified: Secondary | ICD-10-CM | POA: Diagnosis not present

## 2019-08-16 DIAGNOSIS — Z5112 Encounter for antineoplastic immunotherapy: Secondary | ICD-10-CM | POA: Insufficient documentation

## 2019-08-16 DIAGNOSIS — Z95828 Presence of other vascular implants and grafts: Secondary | ICD-10-CM

## 2019-08-16 DIAGNOSIS — D649 Anemia, unspecified: Secondary | ICD-10-CM | POA: Diagnosis not present

## 2019-08-16 DIAGNOSIS — Z5111 Encounter for antineoplastic chemotherapy: Secondary | ICD-10-CM | POA: Diagnosis present

## 2019-08-16 DIAGNOSIS — C7931 Secondary malignant neoplasm of brain: Secondary | ICD-10-CM | POA: Diagnosis not present

## 2019-08-16 DIAGNOSIS — C7951 Secondary malignant neoplasm of bone: Secondary | ICD-10-CM | POA: Diagnosis not present

## 2019-08-16 LAB — CBC WITH DIFFERENTIAL/PLATELET
Abs Immature Granulocytes: 0 K/uL (ref 0.00–0.07)
Basophils Absolute: 0 K/uL (ref 0.0–0.1)
Basophils Relative: 0 %
Eosinophils Absolute: 0 K/uL (ref 0.0–0.5)
Eosinophils Relative: 1 %
HCT: 26.8 % — ABNORMAL LOW (ref 39.0–52.0)
Hemoglobin: 8.5 g/dL — ABNORMAL LOW (ref 13.0–17.0)
Immature Granulocytes: 0 %
Lymphocytes Relative: 24 %
Lymphs Abs: 0.6 K/uL — ABNORMAL LOW (ref 0.7–4.0)
MCH: 31.3 pg (ref 26.0–34.0)
MCHC: 31.7 g/dL (ref 30.0–36.0)
MCV: 98.5 fL (ref 80.0–100.0)
Monocytes Absolute: 0.4 K/uL (ref 0.1–1.0)
Monocytes Relative: 16 %
Neutro Abs: 1.5 K/uL — ABNORMAL LOW (ref 1.7–7.7)
Neutrophils Relative %: 59 %
Platelets: 221 K/uL (ref 150–400)
RBC: 2.72 MIL/uL — ABNORMAL LOW (ref 4.22–5.81)
RDW: 15.9 % — ABNORMAL HIGH (ref 11.5–15.5)
WBC: 2.5 K/uL — ABNORMAL LOW (ref 4.0–10.5)
nRBC: 0 % (ref 0.0–0.2)

## 2019-08-16 LAB — COMPREHENSIVE METABOLIC PANEL
ALT: 14 U/L (ref 0–44)
AST: 19 U/L (ref 15–41)
Albumin: 3.7 g/dL (ref 3.5–5.0)
Alkaline Phosphatase: 130 U/L — ABNORMAL HIGH (ref 38–126)
Anion gap: 11 (ref 5–15)
BUN: 13 mg/dL (ref 6–20)
CO2: 24 mmol/L (ref 22–32)
Calcium: 9.9 mg/dL (ref 8.9–10.3)
Chloride: 101 mmol/L (ref 98–111)
Creatinine, Ser: 0.6 mg/dL — ABNORMAL LOW (ref 0.61–1.24)
GFR calc Af Amer: >60 mL/min (ref >60)
GFR calc non Af Amer: >60 mL/min (ref >60)
Glucose, Bld: 150 mg/dL — ABNORMAL HIGH (ref 70–99)
Potassium: 3.4 mmol/L — ABNORMAL LOW (ref 3.5–5.1)
Sodium: 136 mmol/L (ref 135–145)
Total Bilirubin: 0.4 mg/dL (ref 0.3–1.2)
Total Protein: 8 g/dL (ref 6.5–8.1)

## 2019-08-16 LAB — TSH: TSH: 1.07 u[IU]/mL (ref 0.350–4.500)

## 2019-08-16 LAB — MAGNESIUM: Magnesium: 1.6 mg/dL — ABNORMAL LOW (ref 1.7–2.4)

## 2019-08-16 MED ORDER — SODIUM CHLORIDE 0.9 % IV SOLN: Freq: Once | INTRAVENOUS | Status: AC

## 2019-08-16 MED ORDER — SODIUM CHLORIDE 0.9 % IV SOLN
10.0000 mg | Freq: Once | INTRAVENOUS | Status: AC
  Administered 2019-08-16: 10 mg via INTRAVENOUS
  Filled 2019-08-16: qty 10

## 2019-08-16 MED ORDER — HEPARIN SOD (PORK) LOCK FLUSH 100 UNIT/ML IV SOLN
500.0000 [IU] | Freq: Once | INTRAVENOUS | Status: AC | PRN
  Administered 2019-08-16: 500 [IU]

## 2019-08-16 MED ORDER — SODIUM CHLORIDE 0.9 % IV SOLN
500.0000 mg/m2 | Freq: Once | INTRAVENOUS | Status: AC
  Administered 2019-08-16: 13:00:00 900 mg via INTRAVENOUS
  Filled 2019-08-16: qty 16

## 2019-08-16 MED ORDER — MAGNESIUM SULFATE 2 GM/50ML IV SOLN
2.0000 g | Freq: Once | INTRAVENOUS | Status: AC
  Administered 2019-08-16: 13:00:00 2 g via INTRAVENOUS
  Filled 2019-08-16: qty 50

## 2019-08-16 MED ORDER — SODIUM CHLORIDE 0.9% FLUSH
10.0000 mL | INTRAVENOUS | Status: DC | PRN
  Administered 2019-08-16: 10 mL

## 2019-08-16 MED ORDER — SODIUM CHLORIDE 0.9 % IV SOLN
150.0000 mg | Freq: Once | INTRAVENOUS | Status: AC
  Administered 2019-08-16: 11:00:00 150 mg via INTRAVENOUS
  Filled 2019-08-16: qty 150

## 2019-08-16 MED ORDER — PALONOSETRON HCL INJECTION 0.25 MG/5ML
0.2500 mg | Freq: Once | INTRAVENOUS | Status: AC
  Administered 2019-08-16: 0.25 mg via INTRAVENOUS
  Filled 2019-08-16: qty 5

## 2019-08-16 MED ORDER — SODIUM CHLORIDE 0.9 % IV SOLN
200.0000 mg | Freq: Once | INTRAVENOUS | Status: AC
  Administered 2019-08-16: 12:00:00 200 mg via INTRAVENOUS
  Filled 2019-08-16: qty 8

## 2019-08-16 MED ORDER — POTASSIUM CHLORIDE CRYS ER 20 MEQ PO TBCR
20.0000 meq | EXTENDED_RELEASE_TABLET | Freq: Once | ORAL | Status: AC
  Administered 2019-08-16: 20 meq via ORAL
  Filled 2019-08-16: qty 1

## 2019-08-16 MED ORDER — CYANOCOBALAMIN 1000 MCG/ML IJ SOLN
1000.0000 ug | Freq: Once | INTRAMUSCULAR | Status: AC
  Administered 2019-08-16: 1000 ug via INTRAMUSCULAR
  Filled 2019-08-16: qty 1

## 2019-08-16 MED ORDER — SODIUM CHLORIDE 0.9 % IV SOLN
581.5000 mg | Freq: Once | INTRAVENOUS | Status: AC
  Administered 2019-08-16: 13:00:00 580 mg via INTRAVENOUS
  Filled 2019-08-16: qty 58

## 2019-08-16 NOTE — Progress Notes (Signed)
Brandon Ferguson, Brandon Ferguson   CLINIC:  Medical Oncology/Hematology  PCP:  Patient, No Pcp Per No address on file None   REASON FOR VISIT:  Metastatic adenocarcinoma of the lung.  CURRENT THERAPY: Carboplatin, pembrolizumab and pemetrexed.  BRIEF ONCOLOGIC HISTORY:  Oncology History  Malignant neoplasm of right upper lobe of lung ([place])  05/02/2019 Initial Diagnosis   Malignant neoplasm of upper lobe of right lung ([place])   05/19/2019 Cancer Staging   Staging form: Lung, AJCC 8th Edition - Clinical stage from 05/19/2019: Stage IVB (cT2b, cN1, pM1c) - Signed by Derek Jack, MD on 05/19/2019   07/05/2019 -  Chemotherapy   The patient had palonosetron (ALOXI) injection 0.25 mg, 0.25 mg, Intravenous,  Once, 3 of 4 cycles Administration: 0.25 mg (07/05/2019), 0.25 mg (08/16/2019), 0.25 mg (07/26/2019) PEMEtrexed (ALIMTA) 900 mg in sodium chloride 0.9 % 100 mL chemo infusion, 500 mg/m2 = 900 mg, Intravenous,  Once, 3 of 6 cycles Administration: 900 mg (07/05/2019), 900 mg (08/16/2019), 900 mg (07/26/2019) CARBOplatin (PARAPLATIN) 580 mg in sodium chloride 0.9 % 250 mL chemo infusion, 580 mg (100 % of original dose 581.5 mg), Intravenous,  Once, 3 of 4 cycles Dose modification:   (original dose 581.5 mg, Cycle 1),   (original dose 581.5 mg, Cycle 3),   (original dose 581.5 mg, Cycle 2) Administration: 580 mg (07/05/2019), 580 mg (08/16/2019), 580 mg (07/26/2019) fosaprepitant (EMEND) 150 mg in sodium chloride 0.9 % 145 mL IVPB, 150 mg, Intravenous,  Once, 3 of 4 cycles Administration: 150 mg (07/05/2019), 150 mg (08/16/2019), 150 mg (07/26/2019) pembrolizumab (KEYTRUDA) 200 mg in sodium chloride 0.9 % 50 mL chemo infusion, 200 mg, Intravenous, Once, 3 of 6 cycles Administration: 200 mg (07/05/2019), 200 mg (08/16/2019), 200 mg (07/26/2019)  for chemotherapy treatment.       CANCER STAGING: Cancer Staging Malignant neoplasm of right upper lobe of lung  [facility]) Staging form: Lung, AJCC 8th Edition - Clinical stage from 05/19/2019: Stage IVB (cT2b, cN1, pM1c) - Signed by Derek Jack, MD on 05/19/2019    INTERVAL HISTORY:  Brandon Ferguson 59 y.o. male seen for follow-up and toxicity assessment prior to cycle 3 of chemotherapy.  After last cycle of chemotherapy, he did not experience any GI side effects.  Appetite and energy levels are 100%.  Pain is under control with tramadol.  He gained about 1 pound.  Denies any major headaches.  REVIEW OF SYSTEMS:  Review of Systems  Musculoskeletal:       Low back pain, right mid thigh and right knee pain.  All other systems reviewed and are negative.    PAST MEDICAL/SURGICAL HISTORY:  Past Medical History:  Diagnosis Date  . Cancer ([place])   . GERD (gastroesophageal reflux disease)   . Iron deficiency   . Port-A-Cath in place 06/17/2019  . Vitamin D deficiency    Past Surgical History:  Procedure Laterality Date  . KNEE SURGERY    . PORTACATH PLACEMENT Left 06/29/2019   Procedure: INSERTION PORT-A-CATH;  Surgeon: Aviva Signs, MD;  Location: [facility];  Service: General;  Laterality: Left;  [name] Kitchen VIDEO BRONCHOSCOPY Bilateral 04/29/2019   Procedure: VIDEO BRONCHOSCOPY WITH FLUORO;  Surgeon: Laurin Coder, MD;  Location: [facility];  Service: Endoscopy;  Laterality: Bilateral;  . VIDEO BRONCHOSCOPY Bilateral 05/03/2019   Procedure: VIDEO BRONCHOSCOPY WITHOUT FLUORO;  Surgeon: Rigoberto Noel, MD;  Location: [facility];  Service: Cardiopulmonary;  Laterality: Bilateral;     SOCIAL HISTORY:  Social History   Socioeconomic History  . Marital status: Single    Spouse name: Not on file  . Number of children: Not on file  . Years of education: Not on file  . Highest education level: Not on file  Occupational History  . Not on file  Tobacco Use  . Smoking status: Current Every Day Smoker    Packs/day: 1.00    Types: Cigarettes  . Smokeless tobacco: Never Used  . Tobacco comment: quit  cigs 5 months ago  Substance and Sexual Activity  . Alcohol use: Yes    Alcohol/week: 6.0 standard drinks    Types: 6 Cans of beer per week    Comment: pt drinks 3 large beers daily. hasnt had any in 5 months  . Drug use: Yes    Types: Marijuana    Comment: daily. as of Jan 18,2021 hasnt had any in 5 months  . Sexual activity: Not on file  Other Topics Concern  . Not on file  Social History Narrative  . Not on file   Social Determinants of Health   Financial Resource Strain: Low Risk   . Difficulty of Paying Living Expenses: Not very hard  Food Insecurity: No Food Insecurity  . Worried About Charity fundraiser in the Last Year: Never true  . Ran Out of Food in the Last Year: Never true  Transportation Needs: No Transportation Needs  . Lack of Transportation (Medical): No  . Lack of Transportation (Non-Medical): No  Physical Activity: Inactive  . Days of Exercise per Week: 0 days  . Minutes of Exercise per Session: 0 min  Stress: No Stress Concern Present  . Feeling of Stress : Only a little  Social Connections: Somewhat Isolated  . Frequency of Communication with Friends and Family: More than three times a week  . Frequency of Social Gatherings with Friends and Family: More than three times a week  . Attends Religious Services: 1 to 4 times per year  . Active Member of Clubs or Organizations: No  . Attends Archivist Meetings: Never  . Marital Status: Never married  Intimate Partner Violence: Not At Risk  . Fear of Current or Ex-Partner: No  . Emotionally Abused: No  . Physically Abused: No  . Sexually Abused: No    FAMILY HISTORY:  Family History  Problem Relation Age of Onset  . Breast cancer Mother   . Cirrhosis Father   . Alcohol abuse Father   . Hypertension Sister   . Cancer Brother   . Hypertension Sister   . Hypertension Sister   . Hypertension Sister   . Hypertension Sister     CURRENT MEDICATIONS:  Outpatient Encounter Medications as of  08/16/2019  Medication Sig  . acetaminophen (TYLENOL) 500 MG tablet Take 1,000 mg by mouth every 6 (six) hours as needed for moderate pain or headache.   . CARBOPLATIN IV Inject into the vein every 21 ( twenty-one) days. Every 3 weeks x 4 cycles  . ferrous sulfate 325 (65 FE) MG EC tablet Take 325 mg by mouth daily.  . folic acid (FOLVITE) 1 MG tablet Take 1 tablet (1 mg total) by mouth daily.  [name] Kitchen levETIRAcetam (KEPPRA) 500 MG tablet Take 1 tablet (500 mg total) by mouth 2 (two) times daily.  [name] Kitchen lidocaine-prilocaine (EMLA) cream Apply a small amount to port a cath site and cover with plastic wrap 1 hour prior to chemotherapy appointments  . PEMBROLIZUMAB IV Inject into the vein every  21 ( twenty-one) days.  [name] Kitchen PEMEtrexed 500 mg/m2 in sodium chloride 0.9 % 100 mL Inject 500 mg/m2 into the vein every 21 ( twenty-one) days. Every 3 weeks x 4 cycles  . polyethylene glycol (MIRALAX / GLYCOLAX) 17 g packet Take 17 g by mouth daily as needed.  . prochlorperazine (COMPAZINE) 10 MG tablet Take 1 tablet (10 mg total) by mouth every 6 (six) hours as needed (Nausea or vomiting).  . traMADol (ULTRAM) 50 MG tablet TAKE 1 TABLET BY MOUTH AT BEDTIME AS NEEDED   No facility-administered encounter medications on file as of 08/16/2019.    ALLERGIES:  No Known Allergies   PHYSICAL EXAM:  ECOG Performance status: 1  Vitals:   08/16/19 0901  BP: 109/73  Pulse: 88  Resp: 18  Temp: (!) 97.3 F (36.3 C)  SpO2: 100%   Filed Weights   08/16/19 0901  Weight: 134 lb (60.8 kg)    Physical Exam Vitals reviewed.  Constitutional:      Appearance: Normal appearance.  Cardiovascular:     Rate and Rhythm: Normal rate and regular rhythm.     Heart sounds: Normal heart sounds.  Pulmonary:     Effort: Pulmonary effort is normal.     Breath sounds: Normal breath sounds.  Abdominal:     General: There is no distension.     Palpations: Abdomen is soft. There is no mass.  Skin:    General: Skin is warm.    Neurological:     Mental Status: He is alert and oriented to person, place, and time.  Psychiatric:        Mood and Affect: Mood normal.        Behavior: Behavior normal.      LABORATORY DATA:  I have reviewed the labs as listed.  CBC    Component Value Date/Time   WBC 2.5 (L) 08/16/2019 0857   RBC 2.72 (L) 08/16/2019 0857   HGB 8.5 (L) 08/16/2019 0857   HCT 26.8 (L) 08/16/2019 0857   PLT 221 08/16/2019 0857   MCV 98.5 08/16/2019 0857   MCH 31.3 08/16/2019 0857   MCHC 31.7 08/16/2019 0857   RDW 15.9 (H) 08/16/2019 0857   LYMPHSABS 0.6 (L) 08/16/2019 0857   MONOABS 0.4 08/16/2019 0857   EOSABS 0.0 08/16/2019 0857   BASOSABS 0.0 08/16/2019 0857   CMP Latest Ref Rng & Units 08/16/2019 07/26/2019 07/12/2019  Glucose 70 - 99 mg/dL 150(H) 119(H) 112(H)  BUN 6 - 20 mg/dL 13 15 20   Creatinine 0.61 - 1.24 mg/dL 0.60(L) 0.68 0.64  Sodium 135 - 145 mmol/L 136 139 136  Potassium 3.5 - 5.1 mmol/L 3.4(L) 3.4(L) 3.9  Chloride 98 - 111 mmol/L 101 102 97(L)  CO2 22 - 32 mmol/L 24 25 27   Calcium 8.9 - 10.3 mg/dL 9.9 10.0 10.3  Total Protein 6.5 - 8.1 g/dL 8.0 8.2(H) 8.8(H)  Total Bilirubin 0.3 - 1.2 mg/dL 0.4 0.4 0.4  Alkaline Phos 38 - 126 U/L 130(H) 114 86  AST 15 - 41 U/L 19 18 22   ALT 0 - 44 U/L 14 13 14        DIAGNOSTIC IMAGING:  I have independently reviewed the scans and discussed with the patient.    ASSESSMENT & PLAN:   Malignant neoplasm of right upper lobe of lung ([place]) 1.  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 -He has tolerated last cycle reasonably well. -I  have reviewed his CBC and LFTs which are adequate to proceed with cycle 3 today. -I plan to repeat PET scan prior to next visit in 3 weeks.  He will proceed with his cycle 3 today.  2.  Brain metastasis: -Whole brain RT completed on 05/19/2019. -I reviewed results of the MRI which showed good  response.  3.  Back pain: -Completed XRT to the bone lesions on 06/14/2019. -He will continue tramadol twice daily as needed.  4.  Normocytic anemia: -Hemoglobin is 8.5 today.  Ferritin, iron panel, Q25 and folic acid were normal indicating anemia from chronic inflammation and bone marrow suppression from chemotherapy.  5.  Electrolyte abnormalities: -His magnesium is low at 1.6.  He will receive magnesium 2 g IV. -He will also receive potassium 20 mEq by mouth.    Orders placed this encounter:  Orders Placed This Encounter  Procedures  . NM PET Image Restag (PS) Skull Base To Thigh     Derek Jack, MD [place] [phone]

## 2019-08-16 NOTE — Assessment & Plan Note (Signed)
1.  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 -He has tolerated last cycle reasonably well. -I have reviewed his CBC and LFTs which are adequate to proceed with cycle 3 today. -I plan to repeat PET scan prior to next visit in 3 weeks.  He will proceed with his cycle 3 today.  2.  Brain metastasis: -Whole brain RT completed on 05/19/2019. -I reviewed results of the MRI which showed good response.  3.  Back pain: -Completed XRT to the bone lesions on 06/14/2019. -He will continue tramadol twice daily as needed.  4.  Normocytic anemia: -Hemoglobin is 8.5 today.  Ferritin, iron panel, Q03 and folic acid were normal indicating anemia from chronic inflammation and bone marrow suppression from chemotherapy.  5.  Electrolyte abnormalities: -His magnesium is low at 1.6.  He will receive magnesium 2 g IV. -He will also receive potassium 20 mEq by mouth.

## 2019-08-16 NOTE — Progress Notes (Signed)
Patient no longer getting Udenyca from [facility] based on Medicaid coverage. No doses given.

## 2019-08-16 NOTE — Progress Notes (Signed)
Patient no longer getting Keytruda from [employer] based on Medicaid coverage. Last DOS covered is 08/16/19.

## 2019-08-16 NOTE — Progress Notes (Signed)
Patient presents today for treatment and follow up visit with Dr.Katragadda. Labs pending. Patient has no complaints of any pain today or significant changes since his last visit. MAR reviewed. Vital signs within parameters for treatment.  Message received from [facility] LPN/ [name]. Proceed with treatment today. Add Magnesium level to the CMP. Give 20 mEq PO Potassium x 1 dose. Infuse 2 Grams Magnesium IV x 1 dose. Magnesium level 1.6 today.   Treatment given today per MD orders. Tolerated infusion without adverse affects. Vital signs stable. No complaints at this time. Discharged from clinic ambulatory. F/U with [facility] as scheduled.

## 2019-08-16 NOTE — Progress Notes (Signed)
Patient has been assessed, vital signs and labs have been reviewed by [name]. ANC, Creatinine, LFTs, and Platelets are within treatment parameters, Magnesium low today.  Please give 2 grams IV.  Potassium also low.  Please give 20 mEq PO today per [name]. The patient is good to proceed with treatment at this time.

## 2019-08-16 NOTE — Progress Notes (Signed)
Patient no longer getting Alimta from [place] based on Medicaid coverage. Last DOS covered is 07/26/19.

## 2019-08-16 NOTE — Patient Instructions (Signed)
[place] at [facility] Discharge Instructions  You were seen today by [name]. He went over your recent lab results.  He will schedule you for a repeat PET scan.  He will see you back in 3 weeks for labs, treatment and follow up.   Thank you for choosing [place] at [facility] to provide your oncology and hematology care.  To afford each patient quality time with our provider, please arrive at least 15 minutes before your scheduled appointment time.   If you have a lab appointment with the [facility] please come in thru the  Main Entrance and check in at the main information desk  You need to re-schedule your appointment should you arrive 10 or more minutes late.  We strive to give you quality time with our providers, and arriving late affects you and other patients whose appointments are after yours.  Also, if you no show three or more times for appointments you may be dismissed from the clinic at the providers discretion.     Again, thank you for choosing [facility].  Our hope is that these requests will decrease the amount of time that you wait before being seen by our physicians.       _____________________________________________________________  Should you have questions after your visit to [facility], please contact our office at [phone] between the hours of 8:00 a.m. and 4:30 p.m.  Voicemails left after 4:00 p.m. will not be returned until the following business day.  For prescription refill requests, have your pharmacy contact our office and allow 72 hours.    Cancer Center Support Programs:   > Cancer Support Group  2nd Tuesday of the month 1pm-2pm, Journey Room

## 2019-08-16 NOTE — Patient Instructions (Signed)
[facility] Discharge Instructions for Patients Receiving Chemotherapy  Today you received the following chemotherapy agents   To help prevent nausea and vomiting after your treatment, we encourage you to take your nausea medication   If you develop nausea and vomiting that is not controlled by your nausea medication, call the clinic.   BELOW ARE SYMPTOMS THAT SHOULD BE REPORTED IMMEDIATELY:  *FEVER GREATER THAN 100.5 F  *CHILLS WITH OR WITHOUT FEVER  NAUSEA AND VOMITING THAT IS NOT CONTROLLED WITH YOUR NAUSEA MEDICATION  *UNUSUAL SHORTNESS OF BREATH  *UNUSUAL BRUISING OR BLEEDING  TENDERNESS IN MOUTH AND THROAT WITH OR WITHOUT PRESENCE OF ULCERS  *URINARY PROBLEMS  *BOWEL PROBLEMS  UNUSUAL RASH Items with * indicate a potential emergency and should be followed up as soon as possible.  Feel free to call the clinic should you have any questions or concerns. The clinic phone number is [phone].  Please show the CHEMO ALERT CARD at check-in to the Emergency Department and triage nurse.   

## 2019-08-29 ENCOUNTER — Telehealth: Payer: Self-pay

## 2019-08-29 NOTE — Telephone Encounter (Signed)
Message left on nurse line- returned call to patient -she is asking for PCP in the [place] area- I let patient know I would have Vida Roller return her call today to provide her with some information.

## 2019-08-30 ENCOUNTER — Other Ambulatory Visit (HOSPITAL_COMMUNITY): Payer: Self-pay | Admitting: Nurse Practitioner

## 2019-08-30 DIAGNOSIS — C3411 Malignant neoplasm of upper lobe, right bronchus or lung: Secondary | ICD-10-CM

## 2019-09-01 NOTE — Progress Notes (Signed)

## 2019-09-05 ENCOUNTER — Other Ambulatory Visit (HOSPITAL_COMMUNITY): Payer: Medicaid Other

## 2019-09-05 ENCOUNTER — Other Ambulatory Visit: Payer: Self-pay

## 2019-09-05 ENCOUNTER — Ambulatory Visit (HOSPITAL_COMMUNITY)
Admission: RE | Admit: 2019-09-05 | Discharge: 2019-09-05 | Disposition: A | Discharge status: Home / Self Care | Payer: Medicaid Other | Source: Ambulatory Visit | Attending: Nurse Practitioner | Admitting: Nurse Practitioner

## 2019-09-05 DIAGNOSIS — Z85118 Personal history of other malignant neoplasm of bronchus and lung: Secondary | ICD-10-CM | POA: Diagnosis not present

## 2019-09-05 DIAGNOSIS — C3411 Malignant neoplasm of upper lobe, right bronchus or lung: Secondary | ICD-10-CM

## 2019-09-05 DIAGNOSIS — C7951 Secondary malignant neoplasm of bone: Secondary | ICD-10-CM | POA: Diagnosis not present

## 2019-09-06 ENCOUNTER — Inpatient Hospital Stay (HOSPITAL_COMMUNITY): Payer: Medicaid Other

## 2019-09-06 ENCOUNTER — Inpatient Hospital Stay (HOSPITAL_BASED_OUTPATIENT_CLINIC_OR_DEPARTMENT_OTHER): Payer: Medicaid Other | Admitting: Hematology

## 2019-09-06 VITALS — BP 96/58 | HR 84 | Temp 96.7°F | Resp 18 | Wt 134.0 lb

## 2019-09-06 VITALS — BP 104/59 | HR 64 | Temp 97.5°F | Resp 18

## 2019-09-06 DIAGNOSIS — C3411 Malignant neoplasm of upper lobe, right bronchus or lung: Secondary | ICD-10-CM | POA: Diagnosis not present

## 2019-09-06 DIAGNOSIS — Z5112 Encounter for antineoplastic immunotherapy: Secondary | ICD-10-CM | POA: Diagnosis not present

## 2019-09-06 LAB — COMPREHENSIVE METABOLIC PANEL
ALT: 16 U/L (ref 0–44)
AST: 22 U/L (ref 15–41)
Albumin: 3.6 g/dL (ref 3.5–5.0)
Alkaline Phosphatase: 131 U/L — ABNORMAL HIGH (ref 38–126)
Anion gap: 9 (ref 5–15)
BUN: 10 mg/dL (ref 6–20)
CO2: 24 mmol/L (ref 22–32)
Calcium: 9.4 mg/dL (ref 8.9–10.3)
Chloride: 104 mmol/L (ref 98–111)
Creatinine, Ser: 0.55 mg/dL — ABNORMAL LOW (ref 0.61–1.24)
GFR calc Af Amer: >60 mL/min (ref >60)
GFR calc non Af Amer: >60 mL/min (ref >60)
Glucose, Bld: 114 mg/dL — ABNORMAL HIGH (ref 70–99)
Potassium: 3.4 mmol/L — ABNORMAL LOW (ref 3.5–5.1)
Sodium: 137 mmol/L (ref 135–145)
Total Bilirubin: 0.5 mg/dL (ref 0.3–1.2)
Total Protein: 7.2 g/dL (ref 6.5–8.1)

## 2019-09-06 LAB — CBC WITH DIFFERENTIAL/PLATELET
Abs Immature Granulocytes: 0.01 10*3/uL (ref 0.00–0.07)
Basophils Absolute: 0 10*3/uL (ref 0.0–0.1)
Basophils Relative: 0 %
Eosinophils Absolute: 0 10*3/uL (ref 0.0–0.5)
Eosinophils Relative: 1 %
HCT: 25.3 % — ABNORMAL LOW (ref 39.0–52.0)
Hemoglobin: 8.2 g/dL — ABNORMAL LOW (ref 13.0–17.0)
Immature Granulocytes: 0 %
Lymphocytes Relative: 38 %
Lymphs Abs: 0.9 10*3/uL (ref 0.7–4.0)
MCH: 32 pg (ref 26.0–34.0)
MCHC: 32.4 g/dL (ref 30.0–36.0)
MCV: 98.8 fL (ref 80.0–100.0)
Monocytes Absolute: 0.5 10*3/uL (ref 0.1–1.0)
Monocytes Relative: 19 %
Neutro Abs: 1 10*3/uL — ABNORMAL LOW (ref 1.7–7.7)
Neutrophils Relative %: 42 %
Platelets: 141 10*3/uL — ABNORMAL LOW (ref 150–400)
RBC: 2.56 MIL/uL — ABNORMAL LOW (ref 4.22–5.81)
RDW: 16.3 % — ABNORMAL HIGH (ref 11.5–15.5)
WBC: 2.4 10*3/uL — ABNORMAL LOW (ref 4.0–10.5)
nRBC: 0 % (ref 0.0–0.2)

## 2019-09-06 LAB — MAGNESIUM: Magnesium: 1.5 mg/dL — ABNORMAL LOW (ref 1.7–2.4)

## 2019-09-06 MED ORDER — HEPARIN SOD (PORK) LOCK FLUSH 100 UNIT/ML IV SOLN
500.0000 [IU] | Freq: Once | INTRAVENOUS | Status: AC
  Administered 2019-09-06: 500 [IU] via INTRAVENOUS

## 2019-09-06 MED ORDER — SODIUM CHLORIDE 0.9 % IV SOLN
Freq: Once | INTRAVENOUS | Status: AC
  Filled 2019-09-06: qty 1000

## 2019-09-06 NOTE — Patient Instructions (Addendum)
[place] at [facility] Discharge Instructions  You were seen today by [name]. He went over your recent lab results. We will hold treatment today due to abnormal labs.  We will schedule you for a bone scan and a CT scan of your chest.  He will see you back in one for labs and follow up.   Thank you for choosing [place] at [facility] to provide your oncology and hematology care.  To afford each patient quality time with our provider, please arrive at least 15 minutes before your scheduled appointment time.   If you have a lab appointment with the [place] [facility] please come in thru the  Main Entrance and check in at the main information desk  You need to re-schedule your appointment should you arrive 10 or more minutes late.  We strive to give you quality time with our providers, and arriving late affects you and other patients whose appointments are after yours.  Also, if you no show three or more times for appointments you may be dismissed from the clinic at the providers discretion.     Again, thank you for choosing [facility].  Our hope is that these requests will decrease the amount of time that you wait before being seen by our physicians.       _____________________________________________________________  Should you have questions after your visit to [facility], please contact our office at [phone] between the hours of 8:00 a.m. and 4:30 p.m.  Voicemails left after 4:00 p.m. will not be returned until the following business day.  For prescription refill requests, have your pharmacy contact our office and allow 72 hours.    Cancer Center Support Programs:   > Cancer Support Group  2nd Tuesday of the month 1pm-2pm, Journey Room

## 2019-09-06 NOTE — Progress Notes (Signed)
Patient has been assessed, vital signs and labs have been reviewed by [name]. ANC, Creatinine, LFTs, and Platelets are within treatment parameters per [name]. Magnesium, Potassium, and hemoglobin are low today.  Hold treatment today per [name].  Resume treatment next week. Please give house fluids over two hours today per [name].

## 2019-09-06 NOTE — Progress Notes (Signed)
Patient tolerated hydration with no complaints voiced.  Port site clean and dry with good blood return noted before and after hydration.  No bruising or swelling noted with port.  Band aid applied.  VSS with discharge and left by wheelchair with no s/s of distress noted.

## 2019-09-06 NOTE — Progress Notes (Signed)
[address] [place], [place] [address]   CLINIC:  Medical Oncology/Hematology  PCP:  Patient, No Pcp Per No address on file None   REASON FOR VISIT:  Metastatic adenocarcinoma of the lung.  CURRENT THERAPY: Carboplatin, pembrolizumab and pemetrexed.  BRIEF ONCOLOGIC HISTORY:  Oncology History  Malignant neoplasm of right upper lobe of lung ([place])  05/02/2019 Initial Diagnosis   Malignant neoplasm of upper lobe of right lung ([place])   05/19/2019 Cancer Staging   Staging form: Lung, AJCC 8th Edition - Clinical stage from 05/19/2019: Stage IVB (cT2b, cN1, pM1c) - Signed by Derek Jack, MD on 05/19/2019   07/05/2019 -  Chemotherapy   The patient had palonosetron (ALOXI) injection 0.25 mg, 0.25 mg, Intravenous,  Once, 3 of 4 cycles Administration: 0.25 mg (07/05/2019), 0.25 mg (08/16/2019), 0.25 mg (07/26/2019) PEMEtrexed (ALIMTA) 900 mg in sodium chloride 0.9 % 100 mL chemo infusion, 500 mg/m2 = 900 mg, Intravenous,  Once, 3 of 6 cycles Administration: 900 mg (07/05/2019), 900 mg (08/16/2019), 900 mg (07/26/2019) CARBOplatin (PARAPLATIN) 580 mg in sodium chloride 0.9 % 250 mL chemo infusion, 580 mg (100 % of original dose 581.5 mg), Intravenous,  Once, 3 of 4 cycles Dose modification:   (original dose 581.5 mg, Cycle 1),   (original dose 581.5 mg, Cycle 3),   (original dose 581.5 mg, Cycle 2) Administration: 580 mg (07/05/2019), 580 mg (08/16/2019), 580 mg (07/26/2019) fosaprepitant (EMEND) 150 mg in sodium chloride 0.9 % 145 mL IVPB, 150 mg, Intravenous,  Once, 3 of 4 cycles Administration: 150 mg (07/05/2019), 150 mg (08/16/2019), 150 mg (07/26/2019) pembrolizumab (KEYTRUDA) 200 mg in sodium chloride 0.9 % 50 mL chemo infusion, 200 mg, Intravenous, Once, 3 of 6 cycles Administration: 200 mg (07/05/2019), 200 mg (08/16/2019), 200 mg (07/26/2019)  for chemotherapy treatment.       CANCER STAGING: Cancer Staging Malignant neoplasm of right upper lobe of lung  [facility]) Staging form: Lung, AJCC 8th Edition - Clinical stage from 05/19/2019: Stage IVB (cT2b, cN1, pM1c) - Signed by Derek Jack, MD on 05/19/2019    INTERVAL HISTORY:  Mr. Winebarger 59 y.o. male seen for follow-up and toxicity assessment prior to cycle 4 of chemotherapy.  He has scans scheduled in the next couple of days.  Has mild leg swelling which is stable.  Appetite is 50%.  Energy levels are 75%.  Denies any GI symptoms.  Pain is fairly well controlled with tramadol as needed.  REVIEW OF SYSTEMS:  Review of Systems  Cardiovascular: Positive for leg swelling.  Musculoskeletal:       Low back pain, right mid thigh and right knee pain.  All other systems reviewed and are negative.    PAST MEDICAL/SURGICAL HISTORY:  Past Medical History:  Diagnosis Date  . Cancer ([place])   . GERD (gastroesophageal reflux disease)   . Iron deficiency   . Port-A-Cath in place 06/17/2019  . Vitamin D deficiency    Past Surgical History:  Procedure Laterality Date  . KNEE SURGERY    . PORTACATH PLACEMENT Left 06/29/2019   Procedure: INSERTION PORT-A-CATH;  Surgeon: Aviva Signs, MD;  Location: [facility];  Service: General;  Laterality: Left;  [name] Kitchen VIDEO BRONCHOSCOPY Bilateral 04/29/2019   Procedure: VIDEO BRONCHOSCOPY WITH FLUORO;  Surgeon: Laurin Coder, MD;  Location: [facility];  Service: Endoscopy;  Laterality: Bilateral;  . VIDEO BRONCHOSCOPY Bilateral 05/03/2019   Procedure: VIDEO BRONCHOSCOPY WITHOUT FLUORO;  Surgeon: Rigoberto Noel, MD;  Location: [facility];  Service: Cardiopulmonary;  Laterality: Bilateral;     SOCIAL HISTORY:  Social History   Socioeconomic History  . Marital status: Single    Spouse name: Not on file  . Number of children: Not on file  . Years of education: Not on file  . Highest education level: Not on file  Occupational History  . Not on file  Tobacco Use  . Smoking status: Current Every Day Smoker    Packs/day: 1.00    Types: Cigarettes  .  Smokeless tobacco: Never Used  . Tobacco comment: quit cigs 5 months ago  Substance and Sexual Activity  . Alcohol use: Yes    Alcohol/week: 6.0 standard drinks    Types: 6 Cans of beer per week    Comment: pt drinks 3 large beers daily. hasnt had any in 5 months  . Drug use: Yes    Types: Marijuana    Comment: daily. as of Jan 18,2021 hasnt had any in 5 months  . Sexual activity: Not on file  Other Topics Concern  . Not on file  Social History Narrative  . Not on file   Social Determinants of Health   Financial Resource Strain: Low Risk   . Difficulty of Paying Living Expenses: Not very hard  Food Insecurity: No Food Insecurity  . Worried About Charity fundraiser in the Last Year: Never true  . Ran Out of Food in the Last Year: Never true  Transportation Needs: No Transportation Needs  . Lack of Transportation (Medical): No  . Lack of Transportation (Non-Medical): No  Physical Activity: Inactive  . Days of Exercise per Week: 0 days  . Minutes of Exercise per Session: 0 min  Stress: No Stress Concern Present  . Feeling of Stress : Only a little  Social Connections: Somewhat Isolated  . Frequency of Communication with Friends and Family: More than three times a week  . Frequency of Social Gatherings with Friends and Family: More than three times a week  . Attends Religious Services: 1 to 4 times per year  . Active Member of Clubs or Organizations: No  . Attends Archivist Meetings: Never  . Marital Status: Never married  Intimate Partner Violence: Not At Risk  . Fear of Current or Ex-Partner: No  . Emotionally Abused: No  . Physically Abused: No  . Sexually Abused: No    FAMILY HISTORY:  Family History  Problem Relation Age of Onset  . Breast cancer Mother   . Cirrhosis Father   . Alcohol abuse Father   . Hypertension Sister   . Cancer Brother   . Hypertension Sister   . Hypertension Sister   . Hypertension Sister   . Hypertension Sister      CURRENT MEDICATIONS:  Outpatient Encounter Medications as of 09/06/2019  Medication Sig  . CARBOPLATIN IV Inject into the vein every 21 ( twenty-one) days. Every 3 weeks x 4 cycles  . ferrous sulfate 325 (65 FE) MG EC tablet Take 325 mg by mouth daily.  . folic acid (FOLVITE) 1 MG tablet Take 1 tablet (1 mg total) by mouth daily.  [name] Kitchen levETIRAcetam (KEPPRA) 500 MG tablet Take 1 tablet (500 mg total) by mouth 2 (two) times daily.  [name] Kitchen PEMBROLIZUMAB IV Inject into the vein every 21 ( twenty-one) days.  [name] Kitchen PEMEtrexed 500 mg/m2 in sodium chloride 0.9 % 100 mL Inject 500 mg/m2 into the vein every 21 ( twenty-one) days. Every 3 weeks x 4 cycles  . acetaminophen (TYLENOL) 500 MG tablet  Take 1,000 mg by mouth every 6 (six) hours as needed for moderate pain or headache.   . lidocaine-prilocaine (EMLA) cream Apply a small amount to port a cath site and cover with plastic wrap 1 hour prior to chemotherapy appointments (Patient not taking: Reported on 09/06/2019)  . magnesium oxide (MAG-OX) 400 (241.3 Mg) MG tablet Take 1 tablet (400 mg total) by mouth 2 (two) times daily.  . polyethylene glycol (MIRALAX / GLYCOLAX) 17 g packet Take 17 g by mouth daily as needed. (Patient not taking: Reported on 09/06/2019)  . prochlorperazine (COMPAZINE) 10 MG tablet Take 1 tablet (10 mg total) by mouth every 6 (six) hours as needed (Nausea or vomiting). (Patient not taking: Reported on 09/06/2019)  . traMADol (ULTRAM) 50 MG tablet TAKE 1 TABLET BY MOUTH AT BEDTIME AS NEEDED (Patient not taking: Reported on 09/06/2019)   No facility-administered encounter medications on file as of 09/06/2019.    ALLERGIES:  No Known Allergies   PHYSICAL EXAM:  ECOG Performance status: 1  Vitals:   09/06/19 0901  BP: (!) 96/58  Pulse: 84  Resp: 18  Temp: (!) 96.7 F (35.9 C)  SpO2: 100%   Filed Weights   09/06/19 0901  Weight: 134 lb (60.8 kg)    Physical Exam Vitals reviewed.  Constitutional:      Appearance: Normal  appearance.  Cardiovascular:     Rate and Rhythm: Normal rate and regular rhythm.     Heart sounds: Normal heart sounds.  Pulmonary:     Effort: Pulmonary effort is normal.     Breath sounds: Normal breath sounds.  Abdominal:     General: There is no distension.     Palpations: Abdomen is soft. There is no mass.  Skin:    General: Skin is warm.  Neurological:     Mental Status: He is alert and oriented to person, place, and time.  Psychiatric:        Mood and Affect: Mood normal.        Behavior: Behavior normal.      LABORATORY DATA:  I have reviewed the labs as listed.  CBC    Component Value Date/Time   WBC 2.4 (L) 09/06/2019 0847   RBC 2.56 (L) 09/06/2019 0847   HGB 8.2 (L) 09/06/2019 0847   HCT 25.3 (L) 09/06/2019 0847   PLT 141 (L) 09/06/2019 0847   MCV 98.8 09/06/2019 0847   MCH 32.0 09/06/2019 0847   MCHC 32.4 09/06/2019 0847   RDW 16.3 (H) 09/06/2019 0847   LYMPHSABS 0.9 09/06/2019 0847   MONOABS 0.5 09/06/2019 0847   EOSABS 0.0 09/06/2019 0847   BASOSABS 0.0 09/06/2019 0847   CMP Latest Ref Rng & Units 09/06/2019 08/16/2019 07/26/2019  Glucose 70 - 99 mg/dL 114(H) 150(H) 119(H)  BUN 6 - 20 mg/dL 10 13 15   Creatinine 0.61 - 1.24 mg/dL 0.55(L) 0.60(L) 0.68  Sodium 135 - 145 mmol/L 137 136 139  Potassium 3.5 - 5.1 mmol/L 3.4(L) 3.4(L) 3.4(L)  Chloride 98 - 111 mmol/L 104 101 102  CO2 22 - 32 mmol/L 24 24 25   Calcium 8.9 - 10.3 mg/dL 9.4 9.9 10.0  Total Protein 6.5 - 8.1 g/dL 7.2 8.0 8.2(H)  Total Bilirubin 0.3 - 1.2 mg/dL 0.5 0.4 0.4  Alkaline Phos 38 - 126 U/L 131(H) 130(H) 114  AST 15 - 41 U/L 22 19 18   ALT 0 - 44 U/L 16 14 13        DIAGNOSTIC IMAGING:  I have reviewed scans.  ASSESSMENT & PLAN:   Malignant neoplasm of right upper lobe of lung ([place]) 1.  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 -We have reviewed his labs.  White count  is 2.4 with ANC of 1000.  Platelet count is 141.  LFTs are normal. -We will hold his treatment today.  We will reevaluate him next week. -Because of his electrolyte imbalance, he will receive IV fluids with lites today. -I plan to repeat bone scan and a CT CAP prior to next visit.  2.  Brain metastasis: -Whole brain XRT completed on 05/19/2019. -Posttreatment MRI showed good response.  3.  Back pain: -XRT to the bone lesions on 06/14/2019. -He will take tramadol twice daily as needed.  4.  Normocytic anemia: -Hemoglobin today is 8.2.  This is from anemia from chronic inflammation and bone marrow suppression from chemotherapy.  Nutritional deficiency work-up was negative.  5.  Electrolyte abnormalities: -Magnesium is low at 1.5.  He will receive 2 g of magnesium. -We will start him on magnesium oxide twice daily.    Orders placed this encounter:  Orders Placed This Encounter  Procedures  . CT Chest W Contrast  . NM Bone Scan Whole Body     Derek Jack, MD [place] [phone]

## 2019-09-07 ENCOUNTER — Ambulatory Visit (HOSPITAL_COMMUNITY)
Admission: RE | Admit: 2019-09-07 | Discharge: 2019-09-07 | Disposition: A | Discharge status: Home / Self Care | Payer: Medicaid Other | Source: Ambulatory Visit | Attending: Nurse Practitioner | Admitting: Nurse Practitioner

## 2019-09-07 ENCOUNTER — Other Ambulatory Visit: Payer: Self-pay

## 2019-09-07 DIAGNOSIS — C3411 Malignant neoplasm of upper lobe, right bronchus or lung: Secondary | ICD-10-CM | POA: Insufficient documentation

## 2019-09-07 DIAGNOSIS — Z85118 Personal history of other malignant neoplasm of bronchus and lung: Secondary | ICD-10-CM | POA: Diagnosis not present

## 2019-09-07 DIAGNOSIS — J439 Emphysema, unspecified: Secondary | ICD-10-CM | POA: Diagnosis not present

## 2019-09-07 DIAGNOSIS — N400 Enlarged prostate without lower urinary tract symptoms: Secondary | ICD-10-CM | POA: Diagnosis not present

## 2019-09-07 MED ORDER — IOHEXOL 300 MG/ML  SOLN
100.0000 mL | Freq: Once | INTRAMUSCULAR | Status: AC | PRN
  Administered 2019-09-07: 100 mL via INTRAVENOUS

## 2019-09-09 MED ORDER — MAGNESIUM OXIDE 400 (241.3 MG) MG PO TABS: 400.0000 mg | ORAL_TABLET | Freq: Two times a day (BID) | ORAL | 3 refills | Status: DC

## 2019-09-09 NOTE — Assessment & Plan Note (Signed)
1.  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 -We have reviewed his labs.  White count is 2.4 with ANC of 1000.  Platelet count is 141.  LFTs are normal. -We will hold his treatment today.  We will reevaluate him next week. -Because of his electrolyte imbalance, he will receive IV fluids with lites today. -I plan to repeat bone scan and a CT CAP prior to next visit.  2.  Brain metastasis: -Whole brain XRT completed on 05/19/2019. -Posttreatment MRI showed good response.  3.  Back pain: -XRT to the bone lesions on 06/14/2019. -He will take tramadol twice daily as needed.  4.  Normocytic anemia: -Hemoglobin today is 8.2.  This is from anemia from chronic inflammation and bone marrow suppression from chemotherapy.  Nutritional deficiency work-up was negative.  5.  Electrolyte abnormalities: -Magnesium is low at 1.5.  He will receive 2 g of magnesium. -We will start him on magnesium oxide twice daily.

## 2019-09-13 ENCOUNTER — Other Ambulatory Visit (HOSPITAL_COMMUNITY): Payer: Medicaid Other

## 2019-09-14 ENCOUNTER — Encounter (HOSPITAL_COMMUNITY)
Admission: RE | Admit: 2019-09-14 | Discharge: 2019-09-14 | Disposition: A | Discharge status: Home / Self Care | Payer: Medicaid Other | Source: Ambulatory Visit | Attending: Hematology | Admitting: Hematology

## 2019-09-14 ENCOUNTER — Other Ambulatory Visit: Payer: Self-pay

## 2019-09-14 DIAGNOSIS — C3411 Malignant neoplasm of upper lobe, right bronchus or lung: Secondary | ICD-10-CM | POA: Diagnosis not present

## 2019-09-14 MED ORDER — TECHNETIUM TC 99M MEDRONATE IV KIT
20.0000 | PACK | Freq: Once | INTRAVENOUS | Status: AC | PRN
  Administered 2019-09-14: 16.78 via INTRAVENOUS

## 2019-09-14 NOTE — Progress Notes (Signed)

## 2019-09-15 ENCOUNTER — Ambulatory Visit (HOSPITAL_COMMUNITY): Payer: Medicaid Other | Admitting: Hematology

## 2019-09-15 ENCOUNTER — Other Ambulatory Visit (HOSPITAL_COMMUNITY): Payer: Medicaid Other

## 2019-09-16 ENCOUNTER — Ambulatory Visit (HOSPITAL_COMMUNITY): Payer: Medicaid Other

## 2019-09-20 ENCOUNTER — Inpatient Hospital Stay (HOSPITAL_COMMUNITY): Payer: Medicaid Other | Attending: Hematology

## 2019-09-20 ENCOUNTER — Encounter (HOSPITAL_COMMUNITY): Payer: Self-pay | Admitting: Hematology

## 2019-09-20 ENCOUNTER — Other Ambulatory Visit: Payer: Self-pay

## 2019-09-20 ENCOUNTER — Inpatient Hospital Stay (HOSPITAL_BASED_OUTPATIENT_CLINIC_OR_DEPARTMENT_OTHER): Payer: Medicaid Other | Admitting: Hematology

## 2019-09-20 ENCOUNTER — Inpatient Hospital Stay (HOSPITAL_COMMUNITY): Payer: Medicaid Other

## 2019-09-20 VITALS — BP 111/65 | HR 68 | Temp 97.3°F | Resp 18

## 2019-09-20 DIAGNOSIS — C7931 Secondary malignant neoplasm of brain: Secondary | ICD-10-CM | POA: Diagnosis not present

## 2019-09-20 DIAGNOSIS — D649 Anemia, unspecified: Secondary | ICD-10-CM | POA: Diagnosis not present

## 2019-09-20 DIAGNOSIS — C3411 Malignant neoplasm of upper lobe, right bronchus or lung: Secondary | ICD-10-CM

## 2019-09-20 DIAGNOSIS — C7951 Secondary malignant neoplasm of bone: Secondary | ICD-10-CM | POA: Diagnosis not present

## 2019-09-20 DIAGNOSIS — Z5112 Encounter for antineoplastic immunotherapy: Secondary | ICD-10-CM | POA: Diagnosis present

## 2019-09-20 DIAGNOSIS — Z5111 Encounter for antineoplastic chemotherapy: Secondary | ICD-10-CM | POA: Diagnosis present

## 2019-09-20 DIAGNOSIS — Z95828 Presence of other vascular implants and grafts: Secondary | ICD-10-CM

## 2019-09-20 LAB — CBC WITH DIFFERENTIAL/PLATELET
Abs Immature Granulocytes: 0.01 10*3/uL (ref 0.00–0.07)
Basophils Absolute: 0 10*3/uL (ref 0.0–0.1)
Basophils Relative: 0 %
Eosinophils Absolute: 0 10*3/uL (ref 0.0–0.5)
Eosinophils Relative: 1 %
HCT: 26.5 % — ABNORMAL LOW (ref 39.0–52.0)
Hemoglobin: 8.3 g/dL — ABNORMAL LOW (ref 13.0–17.0)
Immature Granulocytes: 0 %
Lymphocytes Relative: 28 %
Lymphs Abs: 0.8 10*3/uL (ref 0.7–4.0)
MCH: 32.3 pg (ref 26.0–34.0)
MCHC: 31.3 g/dL (ref 30.0–36.0)
MCV: 103.1 fL — ABNORMAL HIGH (ref 80.0–100.0)
Monocytes Absolute: 0.5 10*3/uL (ref 0.1–1.0)
Monocytes Relative: 18 %
Neutro Abs: 1.5 10*3/uL — ABNORMAL LOW (ref 1.7–7.7)
Neutrophils Relative %: 53 %
Platelets: 259 10*3/uL (ref 150–400)
RBC: 2.57 MIL/uL — ABNORMAL LOW (ref 4.22–5.81)
RDW: 15.8 % — ABNORMAL HIGH (ref 11.5–15.5)
WBC: 2.9 10*3/uL — ABNORMAL LOW (ref 4.0–10.5)
nRBC: 0 % (ref 0.0–0.2)

## 2019-09-20 LAB — COMPREHENSIVE METABOLIC PANEL
ALT: 11 U/L (ref 0–44)
AST: 19 U/L (ref 15–41)
Albumin: 3.7 g/dL (ref 3.5–5.0)
Alkaline Phosphatase: 125 U/L (ref 38–126)
Anion gap: 10 (ref 5–15)
BUN: 9 mg/dL (ref 6–20)
CO2: 25 mmol/L (ref 22–32)
Calcium: 9.9 mg/dL (ref 8.9–10.3)
Chloride: 105 mmol/L (ref 98–111)
Creatinine, Ser: 0.56 mg/dL — ABNORMAL LOW (ref 0.61–1.24)
GFR calc Af Amer: >60 mL/min (ref >60)
GFR calc non Af Amer: >60 mL/min (ref >60)
Glucose, Bld: 116 mg/dL — ABNORMAL HIGH (ref 70–99)
Potassium: 3.4 mmol/L — ABNORMAL LOW (ref 3.5–5.1)
Sodium: 140 mmol/L (ref 135–145)
Total Bilirubin: 0.6 mg/dL (ref 0.3–1.2)
Total Protein: 7.6 g/dL (ref 6.5–8.1)

## 2019-09-20 LAB — MAGNESIUM: Magnesium: 1.6 mg/dL — ABNORMAL LOW (ref 1.7–2.4)

## 2019-09-20 MED ORDER — SODIUM CHLORIDE 0.9 % IV SOLN
200.0000 mg | Freq: Once | INTRAVENOUS | Status: AC
  Administered 2019-09-20: 200 mg via INTRAVENOUS
  Filled 2019-09-20: qty 8

## 2019-09-20 MED ORDER — SODIUM CHLORIDE 0.9 % IV SOLN
10.0000 mg | Freq: Once | INTRAVENOUS | Status: AC
  Administered 2019-09-20: 10 mg via INTRAVENOUS
  Filled 2019-09-20: qty 10

## 2019-09-20 MED ORDER — PALONOSETRON HCL INJECTION 0.25 MG/5ML
0.2500 mg | Freq: Once | INTRAVENOUS | Status: AC
  Administered 2019-09-20: 0.25 mg via INTRAVENOUS
  Filled 2019-09-20: qty 5

## 2019-09-20 MED ORDER — SODIUM CHLORIDE 0.9 % IV SOLN
465.2000 mg | Freq: Once | INTRAVENOUS | Status: AC
  Administered 2019-09-20: 470 mg via INTRAVENOUS
  Filled 2019-09-20: qty 47

## 2019-09-20 MED ORDER — HEPARIN SOD (PORK) LOCK FLUSH 100 UNIT/ML IV SOLN
500.0000 [IU] | Freq: Once | INTRAVENOUS | Status: AC | PRN
  Administered 2019-09-20: 500 [IU]

## 2019-09-20 MED ORDER — SODIUM CHLORIDE 0.9 % IV SOLN: Freq: Once | INTRAVENOUS | Status: AC

## 2019-09-20 MED ORDER — SODIUM CHLORIDE 0.9 % IV SOLN
400.0000 mg/m2 | Freq: Once | INTRAVENOUS | Status: AC
  Administered 2019-09-20: 700 mg via INTRAVENOUS
  Filled 2019-09-20: qty 8

## 2019-09-20 MED ORDER — SODIUM CHLORIDE 0.9 % IV SOLN
150.0000 mg | Freq: Once | INTRAVENOUS | Status: AC
  Administered 2019-09-20: 150 mg via INTRAVENOUS
  Filled 2019-09-20: qty 150

## 2019-09-20 MED ORDER — SODIUM CHLORIDE 0.9% FLUSH
10.0000 mL | INTRAVENOUS | Status: DC | PRN
  Administered 2019-09-20: 10 mL

## 2019-09-20 NOTE — Assessment & Plan Note (Signed)
1.  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 -I reviewed CT scan from 09/07/2019.  Lung lesion has improved.  There is L4 compression fracture from previous meta stasis. -Bone scan is also stable. -I reviewed his labs which showed white count of 2.9.  However ANC is 1500. -I will cut back on dose of chemotherapy by 20%.  He will proceed with cycle 4 today.  2.  Brain metastasis: -Whole brain XRT completed on 05/19/2019.  Posttreatment MRI showed good response.  3.  Back pain: -XRT to the bone lesions on 06/14/2019. -He is taking tramadol twice daily as needed.  He is working in the yard without any problems.  4.  Electrolyte abnormalities: -His potassium is 3.4 and magnesium 1.6.  He will receive supplements.  He will continue home supplements.  5.  Normocytic anemia: -Hemoglobin today is 8.3.  This is from chronic inflammation and bone marrow suppression.  Nutritional deficiency work-up is negative.

## 2019-09-20 NOTE — Progress Notes (Signed)
Patient has been assessed, vital signs and labs have been reviewed by Dr. Katragadda. ANC, Creatinine, LFTs, and Platelets are within treatment parameters per Dr. Katragadda. The patient is good to proceed with treatment at this time.  

## 2019-09-20 NOTE — Progress Notes (Signed)
Labs reviewed with MD today. ANC 1.5 . Ok to proceed with treatment today per MD.   Treatment given per orders. Patient tolerated it well without problems. Vitals stable and discharged home from clinic ambulatory. Follow up as scheduled.

## 2019-09-20 NOTE — Patient Instructions (Addendum)
[place] at [facility] Discharge Instructions  You were seen today by [name]. He went over your recent lab and scan results. He will see you back in 3 weeks for labs, treatment and follow up.   Thank you for choosing [place] at [facility] to provide your oncology and hematology care.  To afford each patient quality time with our provider, please arrive at least 15 minutes before your scheduled appointment time.   If you have a lab appointment with the [facility] please come in thru the  Main Entrance and check in at the main information desk  You need to re-schedule your appointment should you arrive 10 or more minutes late.  We strive to give you quality time with our providers, and arriving late affects you and other patients whose appointments are after yours.  Also, if you no show three or more times for appointments you may be dismissed from the clinic at the providers discretion.     Again, thank you for choosing [facility].  Our hope is that these requests will decrease the amount of time that you wait before being seen by our physicians.       _____________________________________________________________  Should you have questions after your visit to [facility], please contact our office at [phone] between the hours of 8:00 a.m. and 4:30 p.m.  Voicemails left after 4:00 p.m. will not be returned until the following business day.  For prescription refill requests, have your pharmacy contact our office and allow 72 hours.    Cancer Center Support Programs:   > Cancer Support Group  2nd Tuesday of the month 1pm-2pm, Journey Room

## 2019-09-20 NOTE — Progress Notes (Signed)
Brandon Ferguson, [place] [id number]   CLINIC:  Medical Oncology/Hematology  PCP:  Patient, No Pcp Per No address on file None   REASON FOR VISIT:  Metastatic adenocarcinoma of the lung.  CURRENT THERAPY: Carboplatin, pembrolizumab and pemetrexed.  BRIEF ONCOLOGIC HISTORY:  Oncology History  Malignant neoplasm of right upper lobe of lung ([place])  05/02/2019 Initial Diagnosis   Malignant neoplasm of upper lobe of right lung ([place])   05/19/2019 Cancer Staging   Staging form: Lung, AJCC 8th Edition - Clinical stage from 05/19/2019: Stage IVB (cT2b, cN1, pM1c) - Signed by Derek Jack, MD on 05/19/2019   07/05/2019 -  Chemotherapy   The patient had palonosetron (ALOXI) injection 0.25 mg, 0.25 mg, Intravenous,  Once, 4 of 4 cycles Administration: 0.25 mg (07/05/2019), 0.25 mg (08/16/2019), 0.25 mg (09/20/2019), 0.25 mg (07/26/2019) PEMEtrexed (ALIMTA) 900 mg in sodium chloride 0.9 % 100 mL chemo infusion, 500 mg/m2 = 900 mg, Intravenous,  Once, 4 of 6 cycles Dose modification: 400 mg/m2 (80 % of original dose 500 mg/m2, Cycle 4, Reason: Provider Judgment) Administration: 900 mg (07/05/2019), 900 mg (08/16/2019), 700 mg (09/20/2019), 900 mg (07/26/2019) CARBOplatin (PARAPLATIN) 580 mg in sodium chloride 0.9 % 250 mL chemo infusion, 580 mg (100 % of original dose 581.5 mg), Intravenous,  Once, 4 of 4 cycles Dose modification:   (original dose 581.5 mg, Cycle 1),   (original dose 581.5 mg, Cycle 3),   (original dose 465.2 mg, Cycle 4, Reason: Provider Judgment),   (original dose 581.5 mg, Cycle 2) Administration: 580 mg (07/05/2019), 580 mg (08/16/2019), 470 mg (09/20/2019), 580 mg (07/26/2019) fosaprepitant (EMEND) 150 mg in sodium chloride 0.9 % 145 mL IVPB, 150 mg, Intravenous,  Once, 4 of 4 cycles Administration: 150 mg (07/05/2019), 150 mg (08/16/2019), 150 mg (09/20/2019), 150 mg (07/26/2019) pembrolizumab (KEYTRUDA) 200 mg in sodium chloride 0.9 % 50 mL chemo  infusion, 200 mg, Intravenous, Once, 4 of 6 cycles Administration: 200 mg (07/05/2019), 200 mg (08/16/2019), 200 mg (09/20/2019), 200 mg (07/26/2019)  for chemotherapy treatment.       CANCER STAGING: Cancer Staging Malignant neoplasm of right upper lobe of lung [facility]) Staging form: Lung, AJCC 8th Edition - Clinical stage from 05/19/2019: Stage IVB (cT2b, cN1, pM1c) - Signed by Derek Jack, MD on 05/19/2019    INTERVAL HISTORY:  Brandon Ferguson 59 y.o. male seen for follow-up and toxicity assessment prior to cycle 4 of chemotherapy.  Appetite and energy levels are 100%.  Reports the back pain is very well controlled.  He is working in the yard and doing yard work.  He is eating well.  Weight has been stable.  REVIEW OF SYSTEMS:  Review of Systems  Musculoskeletal:       Low back pain, right mid thigh and right knee pain.  All other systems reviewed and are negative.    PAST MEDICAL/SURGICAL HISTORY:  Past Medical History:  Diagnosis Date  . Cancer ([place])   . GERD (gastroesophageal reflux disease)   . Iron deficiency   . Port-A-Cath in place 06/17/2019  . Vitamin D deficiency    Past Surgical History:  Procedure Laterality Date  . KNEE SURGERY    . PORTACATH PLACEMENT Left 06/29/2019   Procedure: INSERTION PORT-A-CATH;  Surgeon: Aviva Signs, MD;  Location: [facility];  Service: General;  Laterality: Left;  [name] Kitchen VIDEO BRONCHOSCOPY Bilateral 04/29/2019   Procedure: VIDEO BRONCHOSCOPY WITH FLUORO;  Surgeon: Laurin Coder, MD;  Location: [place];  Service:  Endoscopy;  Laterality: Bilateral;  . VIDEO BRONCHOSCOPY Bilateral 05/03/2019   Procedure: VIDEO BRONCHOSCOPY WITHOUT FLUORO;  Surgeon: Rigoberto Noel, MD;  Location: [facility];  Service: Cardiopulmonary;  Laterality: Bilateral;     SOCIAL HISTORY:  Social History   Socioeconomic History  . Marital status: Single    Spouse name: Not on file  . Number of children: Not on file  . Years of education: Not on file  .  Highest education level: Not on file  Occupational History  . Not on file  Tobacco Use  . Smoking status: Current Every Day Smoker    Packs/day: 1.00    Types: Cigarettes  . Smokeless tobacco: Never Used  . Tobacco comment: quit cigs 5 months ago  Substance and Sexual Activity  . Alcohol use: Yes    Alcohol/week: 6.0 standard drinks    Types: 6 Cans of beer per week    Comment: pt drinks 3 large beers daily. hasnt had any in 5 months  . Drug use: Yes    Types: Marijuana    Comment: daily. as of Jan 18,2021 hasnt had any in 5 months  . Sexual activity: Not on file  Other Topics Concern  . Not on file  Social History Narrative  . Not on file   Social Determinants of Health   Financial Resource Strain: Low Risk   . Difficulty of Paying Living Expenses: Not very hard  Food Insecurity: No Food Insecurity  . Worried About Charity fundraiser in the Last Year: Never true  . Ran Out of Food in the Last Year: Never true  Transportation Needs: No Transportation Needs  . Lack of Transportation (Medical): No  . Lack of Transportation (Non-Medical): No  Physical Activity: Inactive  . Days of Exercise per Week: 0 days  . Minutes of Exercise per Session: 0 min  Stress: No Stress Concern Present  . Feeling of Stress : Only a little  Social Connections: Somewhat Isolated  . Frequency of Communication with Friends and Family: More than three times a week  . Frequency of Social Gatherings with Friends and Family: More than three times a week  . Attends Religious Services: 1 to 4 times per year  . Active Member of Clubs or Organizations: No  . Attends Archivist Meetings: Never  . Marital Status: Never married  Intimate Partner Violence: Not At Risk  . Fear of Current or Ex-Partner: No  . Emotionally Abused: No  . Physically Abused: No  . Sexually Abused: No    FAMILY HISTORY:  Family History  Problem Relation Age of Onset  . Breast cancer Mother   . Cirrhosis Father    . Alcohol abuse Father   . Hypertension Sister   . Cancer Brother   . Hypertension Sister   . Hypertension Sister   . Hypertension Sister   . Hypertension Sister     CURRENT MEDICATIONS:  Outpatient Encounter Medications as of 09/20/2019  Medication Sig  . CARBOPLATIN IV Inject into the vein every 21 ( twenty-one) days. Every 3 weeks x 4 cycles  . ferrous sulfate 325 (65 FE) MG EC tablet Take 325 mg by mouth daily.  . folic acid (FOLVITE) 1 MG tablet Take 1 tablet (1 mg total) by mouth daily.  [name] Kitchen levETIRAcetam (KEPPRA) 500 MG tablet Take 1 tablet (500 mg total) by mouth 2 (two) times daily.  . magnesium oxide (MAG-OX) 400 (241.3 Mg) MG tablet Take 1 tablet (400 mg total) by mouth  2 (two) times daily.  [name] Kitchen PEMBROLIZUMAB IV Inject into the vein every 21 ( twenty-one) days.  [name] Kitchen PEMEtrexed 500 mg/m2 in sodium chloride 0.9 % 100 mL Inject 500 mg/m2 into the vein every 21 ( twenty-one) days. Every 3 weeks x 4 cycles  . acetaminophen (TYLENOL) 500 MG tablet Take 1,000 mg by mouth every 6 (six) hours as needed for moderate pain or headache.   . lidocaine-prilocaine (EMLA) cream Apply a small amount to port a cath site and cover with plastic wrap 1 hour prior to chemotherapy appointments (Patient not taking: Reported on 09/06/2019)  . polyethylene glycol (MIRALAX / GLYCOLAX) 17 g packet Take 17 g by mouth daily as needed. (Patient not taking: Reported on 09/06/2019)  . prochlorperazine (COMPAZINE) 10 MG tablet Take 1 tablet (10 mg total) by mouth every 6 (six) hours as needed (Nausea or vomiting). (Patient not taking: Reported on 09/06/2019)  . traMADol (ULTRAM) 50 MG tablet TAKE 1 TABLET BY MOUTH AT BEDTIME AS NEEDED (Patient not taking: Reported on 09/06/2019)   No facility-administered encounter medications on file as of 09/20/2019.    ALLERGIES:  No Known Allergies   PHYSICAL EXAM:  ECOG Performance status: 1  Vitals:   09/20/19 0933  BP: 106/64  Pulse: 80  Resp: 18  Temp: (!) 96.6 F (35.9  C)  SpO2: 100%   Filed Weights   09/20/19 0933  Weight: 134 lb 11.2 oz (61.1 kg)    Physical Exam Vitals reviewed.  Constitutional:      Appearance: Normal appearance.  Cardiovascular:     Rate and Rhythm: Normal rate and regular rhythm.     Heart sounds: Normal heart sounds.  Pulmonary:     Effort: Pulmonary effort is normal.     Breath sounds: Normal breath sounds.  Abdominal:     General: There is no distension.     Palpations: Abdomen is soft. There is no mass.  Skin:    General: Skin is warm.  Neurological:     Mental Status: He is alert and oriented to person, place, and time.  Psychiatric:        Mood and Affect: Mood normal.        Behavior: Behavior normal.      LABORATORY DATA:  I have reviewed the labs as listed.  CBC    Component Value Date/Time   WBC 2.9 (L) 09/20/2019 0906   RBC 2.57 (L) 09/20/2019 0906   HGB 8.3 (L) 09/20/2019 0906   HCT 26.5 (L) 09/20/2019 0906   PLT 259 09/20/2019 0906   MCV 103.1 (H) 09/20/2019 0906   MCH 32.3 09/20/2019 0906   MCHC 31.3 09/20/2019 0906   RDW 15.8 (H) 09/20/2019 0906   LYMPHSABS 0.8 09/20/2019 0906   MONOABS 0.5 09/20/2019 0906   EOSABS 0.0 09/20/2019 0906   BASOSABS 0.0 09/20/2019 0906   CMP Latest Ref Rng & Units 09/20/2019 09/06/2019 08/16/2019  Glucose 70 - 99 mg/dL 116(H) 114(H) 150(H)  BUN 6 - 20 mg/dL 9 10 13   Creatinine 0.61 - 1.24 mg/dL 0.56(L) 0.55(L) 0.60(L)  Sodium 135 - 145 mmol/L 140 137 136  Potassium 3.5 - 5.1 mmol/L 3.4(L) 3.4(L) 3.4(L)  Chloride 98 - 111 mmol/L 105 104 101  CO2 22 - 32 mmol/L 25 24 24   Calcium 8.9 - 10.3 mg/dL 9.9 9.4 9.9  Total Protein 6.5 - 8.1 g/dL 7.6 7.2 8.0  Total Bilirubin 0.3 - 1.2 mg/dL 0.6 0.5 0.4  Alkaline Phos 38 - 126 U/L 125 131(H)  130(H)  AST 15 - 41 U/L 19 22 19   ALT 0 - 44 U/L 11 16 14        DIAGNOSTIC IMAGING:  I have independently reviewed scans.    ASSESSMENT & PLAN:   Malignant neoplasm of right upper lobe of lung ([place]) 1.  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 -I reviewed CT scan from 09/07/2019.  Lung lesion has improved.  There is L4 compression fracture from previous meta stasis. -Bone scan is also stable. -I reviewed his labs which showed white count of 2.9.  However ANC is 1500. -I will cut back on dose of chemotherapy by 20%.  He will proceed with cycle 4 today.  2.  Brain metastasis: -Whole brain XRT completed on 05/19/2019.  Posttreatment MRI showed good response.  3.  Back pain: -XRT to the bone lesions on 06/14/2019. -He is taking tramadol twice daily as needed.  He is working in the yard without any problems.  4.  Electrolyte abnormalities: -His potassium is 3.4 and magnesium 1.6.  He will receive supplements.  He will continue home supplements.  5.  Normocytic anemia: -Hemoglobin today is 8.3.  This is from chronic inflammation and bone marrow suppression.  Nutritional deficiency work-up is negative.    Orders placed this encounter:  No orders of the defined types were placed in this encounter.    Derek Jack, MD [place] [phone]

## 2019-09-20 NOTE — Patient Instructions (Signed)
[facility] Discharge Instructions for Patients Receiving Chemotherapy  Today you received the following chemotherapy agents   To help prevent nausea and vomiting after your treatment, we encourage you to take your nausea medication   If you develop nausea and vomiting that is not controlled by your nausea medication, call the clinic.   BELOW ARE SYMPTOMS THAT SHOULD BE REPORTED IMMEDIATELY:  *FEVER GREATER THAN 100.5 F  *CHILLS WITH OR WITHOUT FEVER  NAUSEA AND VOMITING THAT IS NOT CONTROLLED WITH YOUR NAUSEA MEDICATION  *UNUSUAL SHORTNESS OF BREATH  *UNUSUAL BRUISING OR BLEEDING  TENDERNESS IN MOUTH AND THROAT WITH OR WITHOUT PRESENCE OF ULCERS  *URINARY PROBLEMS  *BOWEL PROBLEMS  UNUSUAL RASH Items with * indicate a potential emergency and should be followed up as soon as possible.  Feel free to call the clinic should you have any questions or concerns. The clinic phone number is [phone].  Please show the CHEMO ALERT CARD at check-in to the Emergency Department and triage nurse.   

## 2019-10-11 ENCOUNTER — Other Ambulatory Visit (HOSPITAL_COMMUNITY): Payer: Medicaid Other

## 2019-10-11 ENCOUNTER — Other Ambulatory Visit: Payer: Self-pay

## 2019-10-11 ENCOUNTER — Ambulatory Visit (HOSPITAL_COMMUNITY)
Admission: RE | Admit: 2019-10-11 | Discharge: 2019-10-11 | Disposition: A | Discharge status: Home / Self Care | Payer: Medicaid Other | Source: Ambulatory Visit | Attending: Hematology | Admitting: Hematology

## 2019-10-11 ENCOUNTER — Inpatient Hospital Stay (HOSPITAL_COMMUNITY): Payer: Medicaid Other

## 2019-10-11 ENCOUNTER — Inpatient Hospital Stay (HOSPITAL_COMMUNITY): Payer: Medicaid Other | Attending: Hematology | Admitting: Hematology

## 2019-10-11 ENCOUNTER — Other Ambulatory Visit (HOSPITAL_COMMUNITY): Payer: Self-pay | Admitting: Hematology

## 2019-10-11 VITALS — BP 117/67 | HR 87 | Temp 96.9°F | Resp 16 | Wt 133.4 lb

## 2019-10-11 DIAGNOSIS — Z5111 Encounter for antineoplastic chemotherapy: Secondary | ICD-10-CM | POA: Diagnosis present

## 2019-10-11 DIAGNOSIS — C3411 Malignant neoplasm of upper lobe, right bronchus or lung: Secondary | ICD-10-CM

## 2019-10-11 DIAGNOSIS — Z95828 Presence of other vascular implants and grafts: Secondary | ICD-10-CM

## 2019-10-11 DIAGNOSIS — M25561 Pain in right knee: Secondary | ICD-10-CM | POA: Diagnosis not present

## 2019-10-11 DIAGNOSIS — C7951 Secondary malignant neoplasm of bone: Secondary | ICD-10-CM | POA: Insufficient documentation

## 2019-10-11 DIAGNOSIS — M25562 Pain in left knee: Secondary | ICD-10-CM | POA: Diagnosis not present

## 2019-10-11 DIAGNOSIS — C7931 Secondary malignant neoplasm of brain: Secondary | ICD-10-CM | POA: Diagnosis not present

## 2019-10-11 DIAGNOSIS — Z5112 Encounter for antineoplastic immunotherapy: Secondary | ICD-10-CM | POA: Diagnosis present

## 2019-10-11 DIAGNOSIS — Z79899 Other long term (current) drug therapy: Secondary | ICD-10-CM | POA: Insufficient documentation

## 2019-10-11 DIAGNOSIS — D649 Anemia, unspecified: Secondary | ICD-10-CM | POA: Insufficient documentation

## 2019-10-11 LAB — CBC WITH DIFFERENTIAL/PLATELET
Abs Immature Granulocytes: 0.01 10*3/uL (ref 0.00–0.07)
Basophils Absolute: 0 10*3/uL (ref 0.0–0.1)
Basophils Relative: 0 %
Eosinophils Absolute: 0.1 10*3/uL (ref 0.0–0.5)
Eosinophils Relative: 2 %
HCT: 27.3 % — ABNORMAL LOW (ref 39.0–52.0)
Hemoglobin: 8.6 g/dL — ABNORMAL LOW (ref 13.0–17.0)
Immature Granulocytes: 0 %
Lymphocytes Relative: 27 %
Lymphs Abs: 0.8 10*3/uL (ref 0.7–4.0)
MCH: 32.1 pg (ref 26.0–34.0)
MCHC: 31.5 g/dL (ref 30.0–36.0)
MCV: 101.9 fL — ABNORMAL HIGH (ref 80.0–100.0)
Monocytes Absolute: 0.6 10*3/uL (ref 0.1–1.0)
Monocytes Relative: 20 %
Neutro Abs: 1.5 10*3/uL — ABNORMAL LOW (ref 1.7–7.7)
Neutrophils Relative %: 51 %
Platelets: 237 10*3/uL (ref 150–400)
RBC: 2.68 MIL/uL — ABNORMAL LOW (ref 4.22–5.81)
RDW: 14.3 % (ref 11.5–15.5)
WBC: 3 10*3/uL — ABNORMAL LOW (ref 4.0–10.5)
nRBC: 0 % (ref 0.0–0.2)

## 2019-10-11 LAB — COMPREHENSIVE METABOLIC PANEL
ALT: 11 U/L (ref 0–44)
AST: 20 U/L (ref 15–41)
Albumin: 3.8 g/dL (ref 3.5–5.0)
Alkaline Phosphatase: 140 U/L — ABNORMAL HIGH (ref 38–126)
Anion gap: 13 (ref 5–15)
BUN: 8 mg/dL (ref 6–20)
CO2: 24 mmol/L (ref 22–32)
Calcium: 10.1 mg/dL (ref 8.9–10.3)
Chloride: 102 mmol/L (ref 98–111)
Creatinine, Ser: 0.63 mg/dL (ref 0.61–1.24)
GFR calc Af Amer: >60 mL/min (ref >60)
GFR calc non Af Amer: >60 mL/min (ref >60)
Glucose, Bld: 115 mg/dL — ABNORMAL HIGH (ref 70–99)
Potassium: 3.5 mmol/L (ref 3.5–5.1)
Sodium: 139 mmol/L (ref 135–145)
Total Bilirubin: 0.4 mg/dL (ref 0.3–1.2)
Total Protein: 7.9 g/dL (ref 6.5–8.1)

## 2019-10-11 LAB — TSH: TSH: 0.164 u[IU]/mL — ABNORMAL LOW (ref 0.350–4.500)

## 2019-10-11 MED ORDER — SODIUM CHLORIDE 0.9 % IV SOLN: Freq: Once | INTRAVENOUS | Status: AC

## 2019-10-11 MED ORDER — PROCHLORPERAZINE MALEATE 10 MG PO TABS
10.0000 mg | ORAL_TABLET | Freq: Once | ORAL | Status: AC
  Administered 2019-10-11: 10 mg via ORAL

## 2019-10-11 MED ORDER — SODIUM CHLORIDE 0.9 % IV SOLN
500.0000 mg/m2 | Freq: Once | INTRAVENOUS | Status: AC
  Administered 2019-10-11: 900 mg via INTRAVENOUS
  Filled 2019-10-11: qty 20

## 2019-10-11 MED ORDER — HEPARIN SOD (PORK) LOCK FLUSH 100 UNIT/ML IV SOLN
500.0000 [IU] | Freq: Once | INTRAVENOUS | Status: AC | PRN
  Administered 2019-10-11: 500 [IU]

## 2019-10-11 MED ORDER — PROCHLORPERAZINE MALEATE 10 MG PO TABS
ORAL_TABLET | ORAL | Status: AC
  Filled 2019-10-11: qty 1

## 2019-10-11 MED ORDER — SODIUM CHLORIDE 0.9% FLUSH
10.0000 mL | INTRAVENOUS | Status: DC | PRN
  Administered 2019-10-11: 10 mL

## 2019-10-11 MED ORDER — SODIUM CHLORIDE 0.9 % IV SOLN
200.0000 mg | Freq: Once | INTRAVENOUS | Status: AC
  Administered 2019-10-11: 200 mg via INTRAVENOUS
  Filled 2019-10-11: qty 8

## 2019-10-11 NOTE — Progress Notes (Signed)
.  Pharmacist Chemotherapy Monitoring - Follow Up Assessment    I verify that I have reviewed each item in the below checklist:  . Regimen for the patient is scheduled for the appropriate day and plan matches scheduled date. [name] Kitchen Appropriate non-routine labs are ordered dependent on drug ordered. . If applicable, additional medications reviewed and ordered per protocol based on lifetime cumulative doses and/or treatment regimen.   Plan for follow-up and/or issues identified: No . I-vent associated with next due treatment: No . MD and/or nursing notified: No . Confirmed with PA team he still has Medicaid [place].  Wynona Neat 10/11/2019 10:45 AM

## 2019-10-11 NOTE — Progress Notes (Signed)
Patient presents today for treatment and follow up visit with [name]. Labs pending. Vital signs within parameters for treatment.   Labs reviewed by [name]. ANC 1.5 today. Patient has complaints of diarrhea with his last treatment that resolved after taking Imodium. Patient complains of back and right leg pain that is new since his last visit. No swelling noted of the right leg. Patient rates the pain an 8/10 and constant.   Verbal message received from [facility] LPN/ [name]. Proceed with treatment today. No Carboplatin today. Patient has completed 4 cycles of Carboplatin. Per [facility] LPN/ [name] patient to received a xray of the right knee after treatment on his way out. Pemetrexed to be given back at normal dosage per [facility] LPN/ [name].   Treatment given today per MD orders. Tolerated infusion without adverse affects. Vital signs stable. No complaints at this time. Discharged from clinic ambulatory. Escorted patient to xray by RN. F/U with [facility] as scheduled.

## 2019-10-11 NOTE — Progress Notes (Signed)
[place],  [address]   CLINIC:  Medical Oncology/Hematology  PCP:  Patient, No Pcp Per No address on file None   REASON FOR VISIT:  Metastatic adenocarcinoma of the lung.  CURRENT THERAPY: Carboplatin, pembrolizumab and pemetrexed.  BRIEF ONCOLOGIC HISTORY:  Oncology History  Malignant neoplasm of right upper lobe of lung ([place])  05/02/2019 Initial Diagnosis   Malignant neoplasm of upper lobe of right lung ([place])   05/19/2019 Cancer Staging   Staging form: Lung, AJCC 8th Edition - Clinical stage from 05/19/2019: Stage IVB (cT2b, cN1, pM1c) - Signed by Derek Jack, MD on 05/19/2019   07/05/2019 -  Chemotherapy   The patient had palonosetron (ALOXI) injection 0.25 mg, 0.25 mg, Intravenous,  Once, 4 of 4 cycles Administration: 0.25 mg (07/05/2019), 0.25 mg (08/16/2019), 0.25 mg (09/20/2019), 0.25 mg (07/26/2019) PEMEtrexed (ALIMTA) 900 mg in sodium chloride 0.9 % 100 mL chemo infusion, 500 mg/m2 = 900 mg, Intravenous,  Once, 5 of 6 cycles Dose modification: 400 mg/m2 (80 % of original dose 500 mg/m2, Cycle 4, Reason: Provider Judgment), 500 mg/m2 (100 % of original dose 500 mg/m2, Cycle 5, Reason: Provider Judgment) Administration: 900 mg (07/05/2019), 900 mg (08/16/2019), 700 mg (09/20/2019), 900 mg (10/11/2019), 900 mg (07/26/2019) CARBOplatin (PARAPLATIN) 580 mg in sodium chloride 0.9 % 250 mL chemo infusion, 580 mg (100 % of original dose 581.5 mg), Intravenous,  Once, 4 of 4 cycles Dose modification:   (original dose 581.5 mg, Cycle 1),   (original dose 581.5 mg, Cycle 3),   (original dose 465.2 mg, Cycle 4, Reason: Provider Judgment),   (original dose 581.5 mg, Cycle 2) Administration: 580 mg (07/05/2019), 580 mg (08/16/2019), 470 mg (09/20/2019), 580 mg (07/26/2019) fosaprepitant (EMEND) 150 mg in sodium chloride 0.9 % 145 mL IVPB, 150 mg, Intravenous,  Once, 4 of 4 cycles Administration: 150 mg (07/05/2019), 150 mg (08/16/2019), 150 mg  (09/20/2019), 150 mg (07/26/2019) pembrolizumab (KEYTRUDA) 200 mg in sodium chloride 0.9 % 50 mL chemo infusion, 200 mg, Intravenous, Once, 5 of 6 cycles Administration: 200 mg (07/05/2019), 200 mg (08/16/2019), 200 mg (09/20/2019), 200 mg (10/11/2019), 200 mg (07/26/2019)  for chemotherapy treatment.       CANCER STAGING: Cancer Staging Malignant neoplasm of right upper lobe of lung [facility]) Staging form: Lung, AJCC 8th Edition - Clinical stage from 05/19/2019: Stage IVB (cT2b, cN1, pM1c) - Signed by Derek Jack, MD on 05/19/2019    INTERVAL HISTORY:  Mr. Dauria 59 y.o. male seen for follow-up and toxicity assessment prior to next cycle of chemotherapy. Reports right knee pain since last visit which has gotten worse and rated as 8 out of 10. Otherwise appetite and energy levels are 75%. Numbness in the hands and feet has been stable. Low back pain is also very well controlled with tramadol.  REVIEW OF SYSTEMS:  Review of Systems  Musculoskeletal:       Low back pain, right mid thigh and right knee pain.  All other systems reviewed and are negative.    PAST MEDICAL/SURGICAL HISTORY:  Past Medical History:  Diagnosis Date  . Cancer ([place])   . GERD (gastroesophageal reflux disease)   . Iron deficiency   . Port-A-Cath in place 06/17/2019  . Vitamin D deficiency    Past Surgical History:  Procedure Laterality Date  . KNEE SURGERY    . PORTACATH PLACEMENT Left 06/29/2019   Procedure: INSERTION PORT-A-CATH;  Surgeon: Aviva Signs, MD;  Location: [facility];  Service: General;  Laterality: Left;  [name] Kitchen VIDEO BRONCHOSCOPY Bilateral 04/29/2019   Procedure: VIDEO BRONCHOSCOPY WITH FLUORO;  Surgeon: Laurin Coder, MD;  Location: [facility];  Service: Endoscopy;  Laterality: Bilateral;  . VIDEO BRONCHOSCOPY Bilateral 05/03/2019   Procedure: VIDEO BRONCHOSCOPY WITHOUT FLUORO;  Surgeon: Rigoberto Noel, MD;  Location: [facility];  Service: Cardiopulmonary;  Laterality: Bilateral;      SOCIAL HISTORY:  Social History   Socioeconomic History  . Marital status: Single    Spouse name: Not on file  . Number of children: Not on file  . Years of education: Not on file  . Highest education level: Not on file  Occupational History  . Not on file  Tobacco Use  . Smoking status: Current Every Day Smoker    Packs/day: 1.00    Types: Cigarettes  . Smokeless tobacco: Never Used  . Tobacco comment: quit cigs 5 months ago  Substance and Sexual Activity  . Alcohol use: Yes    Alcohol/week: 6.0 standard drinks    Types: 6 Cans of beer per week    Comment: pt drinks 3 large beers daily. hasnt had any in 5 months  . Drug use: Yes    Types: Marijuana    Comment: daily. as of Jan 18,2021 hasnt had any in 5 months  . Sexual activity: Not on file  Other Topics Concern  . Not on file  Social History Narrative  . Not on file   Social Determinants of Health   Financial Resource Strain: Low Risk   . Difficulty of Paying Living Expenses: Not very hard  Food Insecurity: No Food Insecurity  . Worried About Charity fundraiser in the Last Year: Never true  . Ran Out of Food in the Last Year: Never true  Transportation Needs: No Transportation Needs  . Lack of Transportation (Medical): No  . Lack of Transportation (Non-Medical): No  Physical Activity: Inactive  . Days of Exercise per Week: 0 days  . Minutes of Exercise per Session: 0 min  Stress: No Stress Concern Present  . Feeling of Stress : Only a little  Social Connections: Somewhat Isolated  . Frequency of Communication with Friends and Family: More than three times a week  . Frequency of Social Gatherings with Friends and Family: More than three times a week  . Attends Religious Services: 1 to 4 times per year  . Active Member of Clubs or Organizations: No  . Attends Archivist Meetings: Never  . Marital Status: Never married  Intimate Partner Violence: Not At Risk  . Fear of Current or Ex-Partner: No   . Emotionally Abused: No  . Physically Abused: No  . Sexually Abused: No    FAMILY HISTORY:  Family History  Problem Relation Age of Onset  . Breast cancer Mother   . Cirrhosis Father   . Alcohol abuse Father   . Hypertension Sister   . Cancer Brother   . Hypertension Sister   . Hypertension Sister   . Hypertension Sister   . Hypertension Sister     CURRENT MEDICATIONS:  Outpatient Encounter Medications as of 10/11/2019  Medication Sig  . CARBOPLATIN IV Inject into the vein every 21 ( twenty-one) days. Every 3 weeks x 4 cycles  . ferrous sulfate 325 (65 FE) MG EC tablet Take 325 mg by mouth daily.  . folic acid (FOLVITE) 1 MG tablet Take 1 tablet (1 mg total) by mouth daily.  [name] Kitchen levETIRAcetam (KEPPRA) 500 MG tablet Take 1 tablet (  500 mg total) by mouth 2 (two) times daily.  . magnesium oxide (MAG-OX) 400 (241.3 Mg) MG tablet Take 1 tablet (400 mg total) by mouth 2 (two) times daily.  [name] Kitchen PEMBROLIZUMAB IV Inject into the vein every 21 ( twenty-one) days.  [name] Kitchen PEMEtrexed 500 mg/m2 in sodium chloride 0.9 % 100 mL Inject 500 mg/m2 into the vein every 21 ( twenty-one) days. Every 3 weeks x 4 cycles  . acetaminophen (TYLENOL) 500 MG tablet Take 1,000 mg by mouth every 6 (six) hours as needed for moderate pain or headache.   . lidocaine-prilocaine (EMLA) cream Apply a small amount to port a cath site and cover with plastic wrap 1 hour prior to chemotherapy appointments (Patient not taking: Reported on 09/06/2019)  . polyethylene glycol (MIRALAX / GLYCOLAX) 17 g packet Take 17 g by mouth daily as needed. (Patient not taking: Reported on 09/06/2019)  . prochlorperazine (COMPAZINE) 10 MG tablet Take 1 tablet (10 mg total) by mouth every 6 (six) hours as needed (Nausea or vomiting). (Patient not taking: Reported on 09/06/2019)  . traMADol (ULTRAM) 50 MG tablet TAKE 1 TABLET BY MOUTH AT BEDTIME AS NEEDED (Patient not taking: Reported on 09/06/2019)   No facility-administered encounter medications on  file as of 10/11/2019.    ALLERGIES:  No Known Allergies   PHYSICAL EXAM:  ECOG Performance status: 1  There were no vitals filed for this visit. There were no vitals filed for this visit.  Physical Exam Vitals reviewed.  Constitutional:      Appearance: Normal appearance.  Cardiovascular:     Rate and Rhythm: Normal rate and regular rhythm.     Heart sounds: Normal heart sounds.  Pulmonary:     Effort: Pulmonary effort is normal.     Breath sounds: Normal breath sounds.  Abdominal:     General: There is no distension.     Palpations: Abdomen is soft. There is no mass.  Skin:    General: Skin is warm.  Neurological:     Mental Status: He is alert and oriented to person, place, and time.  Psychiatric:        Mood and Affect: Mood normal.        Behavior: Behavior normal.      LABORATORY DATA:  I have reviewed the labs as listed.  CBC    Component Value Date/Time   WBC 3.0 (L) 10/11/2019 0858   RBC 2.68 (L) 10/11/2019 0858   HGB 8.6 (L) 10/11/2019 0858   HCT 27.3 (L) 10/11/2019 0858   PLT 237 10/11/2019 0858   MCV 101.9 (H) 10/11/2019 0858   MCH 32.1 10/11/2019 0858   MCHC 31.5 10/11/2019 0858   RDW 14.3 10/11/2019 0858   LYMPHSABS 0.8 10/11/2019 0858   MONOABS 0.6 10/11/2019 0858   EOSABS 0.1 10/11/2019 0858   BASOSABS 0.0 10/11/2019 0858   CMP Latest Ref Rng & Units 10/11/2019 09/20/2019 09/06/2019  Glucose 70 - 99 mg/dL 115(H) 116(H) 114(H)  BUN 6 - 20 mg/dL 8 9 10   Creatinine 0.61 - 1.24 mg/dL 0.63 0.56(L) 0.55(L)  Sodium 135 - 145 mmol/L 139 140 137  Potassium 3.5 - 5.1 mmol/L 3.5 3.4(L) 3.4(L)  Chloride 98 - 111 mmol/L 102 105 104  CO2 22 - 32 mmol/L 24 25 24   Calcium 8.9 - 10.3 mg/dL 10.1 9.9 9.4  Total Protein 6.5 - 8.1 g/dL 7.9 7.6 7.2  Total Bilirubin 0.3 - 1.2 mg/dL 0.4 0.6 0.5  Alkaline Phos 38 - 126 U/L 140(H) 125  131(H)  AST 15 - 41 U/L 20 19 22   ALT 0 - 44 U/L 11 11 16        DIAGNOSTIC IMAGING:  I have independently reviewed  scans.    ASSESSMENT & PLAN:   Malignant neoplasm of right upper lobe of lung ([place]) 1. 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 -CT scan on 09/07/2019 showed improved lung lesion. L4 compression fracture from previous metastatic disease. -Bone scan is stable reviewed by me. -We reviewed his labs. LFTs are grossly normal. -We will discontinue carboplatin at this time. We will continue pemetrexed and pembrolizumab maintenance. I will increase pemetrexed to 500 mg per metered squared. -We will see him back in 3 weeks for follow-up.  2. Brain metastasis: -Whole brain XRT completed on 05/19/2019. Posttreatment MRI showed good response. We will follow up on another MRI.  3. Back pain: -XRT to the bone lesions on 06/14/2019. -Continue tramadol twice daily as needed. -He complained of right knee pain which is worse than his baseline. We have reviewed x-rays which showed arthritis and previous hardware. If pain persists will consider MRI.  4. Electrolyte abnormalities: -Potassium today is normal. He does not require any supplementation.  5. Normocytic anemia: -Hemoglobin today is 8.6. This is from chronic inflammation and bone marrow suppression. Nutritional deficiency work-up was negative.    Orders placed this encounter:  No orders of the defined types were placed in this encounter.    Derek Jack, MD [place] [phone]

## 2019-10-11 NOTE — Patient Instructions (Signed)
[facility] Discharge Instructions for Patients Receiving Chemotherapy  Today you received the following chemotherapy agents   To help prevent nausea and vomiting after your treatment, we encourage you to take your nausea medication   If you develop nausea and vomiting that is not controlled by your nausea medication, call the clinic.   BELOW ARE SYMPTOMS THAT SHOULD BE REPORTED IMMEDIATELY:  *FEVER GREATER THAN 100.5 F  *CHILLS WITH OR WITHOUT FEVER  NAUSEA AND VOMITING THAT IS NOT CONTROLLED WITH YOUR NAUSEA MEDICATION  *UNUSUAL SHORTNESS OF BREATH  *UNUSUAL BRUISING OR BLEEDING  TENDERNESS IN MOUTH AND THROAT WITH OR WITHOUT PRESENCE OF ULCERS  *URINARY PROBLEMS  *BOWEL PROBLEMS  UNUSUAL RASH Items with * indicate a potential emergency and should be followed up as soon as possible.  Feel free to call the clinic should you have any questions or concerns. The clinic phone number is [phone].  Please show the CHEMO ALERT CARD at check-in to the Emergency Department and triage nurse.   

## 2019-10-11 NOTE — Progress Notes (Signed)
Patient has been assessed, vital signs and labs have been reviewed by [name]. ANC, Creatinine, LFTs, and Platelets are within treatment parameters per [name]. The patient is good to proceed with treatment at this time. Carbo completed with last treatment, increasing dose of Pemetrexed to normal dose per [name].

## 2019-10-11 NOTE — Patient Instructions (Addendum)
[place] at [facility] Discharge Instructions  You were seen today by [name]. He went over your recent lab results. He will schedule you for a xray of your right knee. He will see you back in 3 weeks for labs, treatment and follow up.   Thank you for choosing [place] at [facility] to provide your oncology and hematology care.  To afford each patient quality time with our provider, please arrive at least 15 minutes before your scheduled appointment time.   If you have a lab appointment with the [facility] please come in thru the  Main Entrance and check in at the main information desk  You need to re-schedule your appointment should you arrive 10 or more minutes late.  We strive to give you quality time with our providers, and arriving late affects you and other patients whose appointments are after yours.  Also, if you no show three or more times for appointments you may be dismissed from the clinic at the providers discretion.     Again, thank you for choosing [facility].  Our hope is that these requests will decrease the amount of time that you wait before being seen by our physicians.       _____________________________________________________________  Should you have questions after your visit to [facility], please contact our office at [phone] between the hours of 8:00 a.m. and 4:30 p.m.  Voicemails left after 4:00 p.m. will not be returned until the following business day.  For prescription refill requests, have your pharmacy contact our office and allow 72 hours.    Cancer Center Support Programs:   > Cancer Support Group  2nd Tuesday of the month 1pm-2pm, Journey Room

## 2019-10-12 DIAGNOSIS — C3490 Malignant neoplasm of unspecified part of unspecified bronchus or lung: Secondary | ICD-10-CM | POA: Diagnosis not present

## 2019-10-12 DIAGNOSIS — H6123 Impacted cerumen, bilateral: Secondary | ICD-10-CM | POA: Diagnosis not present

## 2019-10-12 DIAGNOSIS — M1731 Unilateral post-traumatic osteoarthritis, right knee: Secondary | ICD-10-CM | POA: Diagnosis not present

## 2019-10-21 ENCOUNTER — Telehealth (HOSPITAL_COMMUNITY): Payer: Self-pay

## 2019-10-21 NOTE — Telephone Encounter (Signed)
Nutrition Follow-up:  Patient with non small cell lung cancer with metastatic disease to brain.  Patient receiving chemotherapy.   Spoke with patient via phone for nutrition follow-up.  Patient reports that his appetite is good.  Reports after chemotherapy appetite goes down but then picks back up.  Denies nausea.  Reports that he eats cereal for breakfast or eggs with bacon/sausage. Lunch is usually leftovers from supper.  Supper is chicken, pork chop, fatback meat, cooked greens.  Last night ate hamburger patty, baked beans, cooked spinach and fatback meat.  Currently out of ensure/boost shakes.     Medications: reviewed  Labs: reviewed  Anthropometrics:   Weight 133 lb 6.4 oz on 5/4 increased from 132 lb 14.4 oz on 2/2.   Stable at 133-135 lb 143 lb on 1/6   NUTRITION DIAGNOSIS: Inadequate oral intake continues   INTERVENTION:  Encouraged 3 meals per day including good sources of protein and bedtime snack.  We discussed foods he could snack on. Will provide another complimentary case of ensure enlive to patient for pick up on 5/25.   Patient has contact information    MONITORING, EVALUATION, GOAL: weight trends, intake   NEXT VISIT: June 18th, phone f/u  Jj Enyeart B. Zenia Resides, [place], [place] Registered Dietitian [phone] (pager)

## 2019-10-22 NOTE — Assessment & Plan Note (Signed)
1. 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 -CT scan on 09/07/2019 showed improved lung lesion. L4 compression fracture from previous metastatic disease. -Bone scan is stable reviewed by me. -We reviewed his labs. LFTs are grossly normal. -We will discontinue carboplatin at this time. We will continue pemetrexed and pembrolizumab maintenance. I will increase pemetrexed to 500 mg per metered squared. -We will see him back in 3 weeks for follow-up.  2. Brain metastasis: -Whole brain XRT completed on 05/19/2019. Posttreatment MRI showed good response. We will follow up on another MRI.  3. Back pain: -XRT to the bone lesions on 06/14/2019. -Continue tramadol twice daily as needed. -He complained of right knee pain which is worse than his baseline. We have reviewed x-rays which showed arthritis and previous hardware. If pain persists will consider MRI.  4. Electrolyte abnormalities: -Potassium today is normal. He does not require any supplementation.  5. Normocytic anemia: -Hemoglobin today is 8.6. This is from chronic inflammation and bone marrow suppression. Nutritional deficiency work-up was negative.

## 2019-11-01 ENCOUNTER — Inpatient Hospital Stay (HOSPITAL_BASED_OUTPATIENT_CLINIC_OR_DEPARTMENT_OTHER): Payer: Medicaid Other | Admitting: Hematology

## 2019-11-01 ENCOUNTER — Other Ambulatory Visit: Payer: Self-pay

## 2019-11-01 ENCOUNTER — Inpatient Hospital Stay (HOSPITAL_COMMUNITY): Payer: Medicaid Other

## 2019-11-01 VITALS — BP 105/64 | HR 75 | Temp 97.2°F | Resp 18 | Wt 134.4 lb

## 2019-11-01 VITALS — BP 123/74 | HR 59 | Temp 98.6°F | Resp 16

## 2019-11-01 DIAGNOSIS — C3411 Malignant neoplasm of upper lobe, right bronchus or lung: Secondary | ICD-10-CM

## 2019-11-01 DIAGNOSIS — Z5112 Encounter for antineoplastic immunotherapy: Secondary | ICD-10-CM | POA: Diagnosis not present

## 2019-11-01 DIAGNOSIS — Z95828 Presence of other vascular implants and grafts: Secondary | ICD-10-CM

## 2019-11-01 LAB — CBC WITH DIFFERENTIAL/PLATELET
Abs Immature Granulocytes: 0 10*3/uL (ref 0.00–0.07)
Basophils Absolute: 0 10*3/uL (ref 0.0–0.1)
Basophils Relative: 0 %
Eosinophils Absolute: 0.1 10*3/uL (ref 0.0–0.5)
Eosinophils Relative: 1 %
HCT: 27.5 % — ABNORMAL LOW (ref 39.0–52.0)
Hemoglobin: 8.7 g/dL — ABNORMAL LOW (ref 13.0–17.0)
Immature Granulocytes: 0 %
Lymphocytes Relative: 18 %
Lymphs Abs: 0.8 10*3/uL (ref 0.7–4.0)
MCH: 33.1 pg (ref 26.0–34.0)
MCHC: 31.6 g/dL (ref 30.0–36.0)
MCV: 104.6 fL — ABNORMAL HIGH (ref 80.0–100.0)
Monocytes Absolute: 0.5 10*3/uL (ref 0.1–1.0)
Monocytes Relative: 11 %
Neutro Abs: 2.9 10*3/uL (ref 1.7–7.7)
Neutrophils Relative %: 70 %
Platelets: 295 10*3/uL (ref 150–400)
RBC: 2.63 MIL/uL — ABNORMAL LOW (ref 4.22–5.81)
RDW: 14.5 % (ref 11.5–15.5)
WBC: 4.1 10*3/uL (ref 4.0–10.5)
nRBC: 0 % (ref 0.0–0.2)

## 2019-11-01 LAB — COMPREHENSIVE METABOLIC PANEL
ALT: 11 U/L (ref 0–44)
AST: 22 U/L (ref 15–41)
Albumin: 3.7 g/dL (ref 3.5–5.0)
Alkaline Phosphatase: 147 U/L — ABNORMAL HIGH (ref 38–126)
Anion gap: 12 (ref 5–15)
BUN: 12 mg/dL (ref 6–20)
CO2: 26 mmol/L (ref 22–32)
Calcium: 9.9 mg/dL (ref 8.9–10.3)
Chloride: 98 mmol/L (ref 98–111)
Creatinine, Ser: 0.5 mg/dL — ABNORMAL LOW (ref 0.61–1.24)
GFR calc Af Amer: >60 mL/min (ref >60)
GFR calc non Af Amer: >60 mL/min (ref >60)
Glucose, Bld: 156 mg/dL — ABNORMAL HIGH (ref 70–99)
Potassium: 3.4 mmol/L — ABNORMAL LOW (ref 3.5–5.1)
Sodium: 136 mmol/L (ref 135–145)
Total Bilirubin: 0.5 mg/dL (ref 0.3–1.2)
Total Protein: 8.1 g/dL (ref 6.5–8.1)

## 2019-11-01 MED ORDER — CYANOCOBALAMIN 1000 MCG/ML IJ SOLN
1000.0000 ug | Freq: Once | INTRAMUSCULAR | Status: AC
  Administered 2019-11-01: 1000 ug via INTRAMUSCULAR
  Filled 2019-11-01: qty 1

## 2019-11-01 MED ORDER — SODIUM CHLORIDE 0.9 % IV SOLN
200.0000 mg | Freq: Once | INTRAVENOUS | Status: AC
  Administered 2019-11-01: 200 mg via INTRAVENOUS
  Filled 2019-11-01: qty 8

## 2019-11-01 MED ORDER — HEPARIN SOD (PORK) LOCK FLUSH 100 UNIT/ML IV SOLN
500.0000 [IU] | Freq: Once | INTRAVENOUS | Status: AC | PRN
  Administered 2019-11-01: 500 [IU]

## 2019-11-01 MED ORDER — POTASSIUM CHLORIDE CRYS ER 20 MEQ PO TBCR
20.0000 meq | EXTENDED_RELEASE_TABLET | Freq: Two times a day (BID) | ORAL | Status: DC
  Administered 2019-11-01: 20 meq via ORAL
  Filled 2019-11-01: qty 1

## 2019-11-01 MED ORDER — SODIUM CHLORIDE 0.9% FLUSH
10.0000 mL | INTRAVENOUS | Status: DC | PRN
  Administered 2019-11-01: 10 mL

## 2019-11-01 MED ORDER — CYANOCOBALAMIN 1000 MCG/ML IJ SOLN
INTRAMUSCULAR | Status: AC
  Filled 2019-11-01: qty 1

## 2019-11-01 MED ORDER — SODIUM CHLORIDE 0.9 % IV SOLN
500.0000 mg/m2 | Freq: Once | INTRAVENOUS | Status: AC
  Administered 2019-11-01: 900 mg via INTRAVENOUS
  Filled 2019-11-01: qty 16

## 2019-11-01 MED ORDER — SODIUM CHLORIDE 0.9 % IV SOLN: Freq: Once | INTRAVENOUS | Status: AC

## 2019-11-01 MED ORDER — PROCHLORPERAZINE MALEATE 10 MG PO TABS
10.0000 mg | ORAL_TABLET | Freq: Once | ORAL | Status: AC
  Administered 2019-11-01: 10 mg via ORAL
  Filled 2019-11-01: qty 1

## 2019-11-01 NOTE — Patient Instructions (Signed)
[facility] at [facility] Discharge Instructions  Labs drawn from portacath today   Thank you for choosing [facility] at [facility] to provide your oncology and hematology care.  To afford each patient quality time with our provider, please arrive at least 15 minutes before your scheduled appointment time.   If you have a lab appointment with the Cancer Center please come in thru the Main Entrance and check in at the main information desk.  You need to re-schedule your appointment should you arrive 10 or more minutes late.  We strive to give you quality time with our providers, and arriving late affects you and other patients whose appointments are after yours.  Also, if you no show three or more times for appointments you may be dismissed from the clinic at the providers discretion.     Again, thank you for choosing [facility].  Our hope is that these requests will decrease the amount of time that you wait before being seen by our physicians.       _____________________________________________________________  Should you have questions after your visit to [facility], please contact our office at [phone] between the hours of 8:00 a.m. and 4:30 p.m.  Voicemails left after 4:00 p.m. will not be returned until the following business day.  For prescription refill requests, have your pharmacy contact our office and allow 72 hours.    Due to Covid, you will need to wear a mask upon entering the hospital. If you do not have a mask, a mask will be given to you at the Main Entrance upon arrival. For doctor visits, patients may have 1 support person with them. For treatment visits, patients can not have anyone with them due to social distancing guidelines and our immunocompromised population.     

## 2019-11-01 NOTE — Progress Notes (Signed)
Brandon Ferguson, Brandon Ferguson [id number]   CLINIC:  Medical Oncology/Hematology  PCP:  Patient, No Pcp Per None None   REASON FOR VISIT:  Follow-up for adenocarcinoma of the lung  CURRENT THERAPY: Maintenance pemetrexed and pembrolizumab  BRIEF ONCOLOGIC HISTORY:  Oncology History  Malignant neoplasm of right upper lobe of lung ([place])  05/02/2019 Initial Diagnosis   Malignant neoplasm of upper lobe of right lung ([place])   05/19/2019 Cancer Staging   Staging form: Lung, AJCC 8th Edition - Clinical stage from 05/19/2019: Stage IVB (cT2b, cN1, pM1c) - Signed by Derek Jack, MD on 05/19/2019   07/05/2019 -  Chemotherapy   The patient had palonosetron (ALOXI) injection 0.25 mg, 0.25 mg, Intravenous,  Once, 4 of 4 cycles Administration: 0.25 mg (07/05/2019), 0.25 mg (08/16/2019), 0.25 mg (09/20/2019), 0.25 mg (07/26/2019) PEMEtrexed (ALIMTA) 900 mg in sodium chloride 0.9 % 100 mL chemo infusion, 500 mg/m2 = 900 mg, Intravenous,  Once, 5 of 6 cycles Dose modification: 400 mg/m2 (80 % of original dose 500 mg/m2, Cycle 4, Reason: Provider Judgment), 500 mg/m2 (100 % of original dose 500 mg/m2, Cycle 5, Reason: Provider Judgment) Administration: 900 mg (07/05/2019), 900 mg (08/16/2019), 700 mg (09/20/2019), 900 mg (10/11/2019), 900 mg (07/26/2019) CARBOplatin (PARAPLATIN) 580 mg in sodium chloride 0.9 % 250 mL chemo infusion, 580 mg (100 % of original dose 581.5 mg), Intravenous,  Once, 4 of 4 cycles Dose modification:   (original dose 581.5 mg, Cycle 1),   (original dose 581.5 mg, Cycle 3),   (original dose 465.2 mg, Cycle 4, Reason: Provider Judgment),   (original dose 581.5 mg, Cycle 2) Administration: 580 mg (07/05/2019), 580 mg (08/16/2019), 470 mg (09/20/2019), 580 mg (07/26/2019) fosaprepitant (EMEND) 150 mg in sodium chloride 0.9 % 145 mL IVPB, 150 mg, Intravenous,  Once, 4 of 4 cycles Administration: 150 mg (07/05/2019), 150 mg (08/16/2019), 150 mg (09/20/2019), 150 mg  (07/26/2019) pembrolizumab (KEYTRUDA) 200 mg in sodium chloride 0.9 % 50 mL chemo infusion, 200 mg, Intravenous, Once, 5 of 6 cycles Administration: 200 mg (07/05/2019), 200 mg (08/16/2019), 200 mg (09/20/2019), 200 mg (10/11/2019), 200 mg (07/26/2019)  for chemotherapy treatment.      CANCER STAGING: Cancer Staging Malignant neoplasm of right upper lobe of lung [facility]) Staging form: Lung, AJCC 8th Edition - Clinical stage from 05/19/2019: Stage IVB (cT2b, cN1, pM1c) - Signed by Derek Jack, MD on 05/19/2019   INTERVAL HISTORY:  Brandon Ferguson 59 y.o. male returns for routine follow-up and consideration for next cycle of chemotherapy. Elad was last seen on 10/11/2019.  Due for cycle #2 of pembrolizumab, and pemetrexed today.   Overall, he tells me he has been feeling pretty well. He had some mild nausea after his last treatment, which resolved with his prescribed medication. He notes that he is not getting out in his yard and mowing or weed-eating due to the pollen. He notes that he is currently having a headache that is "thumping." He will take Tylenol for his headaches. He reports a good appetite and food intake.   Overall, he feels ready for next cycle of chemo today.    REVIEW OF SYSTEMS:  Review of Systems  Constitutional: Negative for appetite change, chills, fatigue and fever.  HENT:   Negative for lump/mass, mouth sores, sore throat and trouble swallowing.   Eyes: Negative for eye problems.  Respiratory: Positive for cough. Negative for chest tightness, shortness of breath and wheezing.   Cardiovascular: Negative for chest pain and  palpitations.  Gastrointestinal: Positive for nausea (mild after Tx). Negative for abdominal pain, constipation, diarrhea and vomiting.  Genitourinary: Negative for bladder incontinence, dysuria, frequency and hematuria.   Musculoskeletal: Negative for arthralgias, back pain, flank pain and myalgias.  Skin: Negative for rash.  Neurological:  Positive for headaches. Negative for dizziness, light-headedness and numbness.  Hematological: Does not bruise/bleed easily.  Psychiatric/Behavioral: Negative for depression. The patient is not nervous/anxious.     PAST MEDICAL/SURGICAL HISTORY:  Past Medical History:  Diagnosis Date  . Cancer ([place])   . GERD (gastroesophageal reflux disease)   . Iron deficiency   . Port-A-Cath in place 06/17/2019  . Vitamin D deficiency    Past Surgical History:  Procedure Laterality Date  . KNEE SURGERY    . PORTACATH PLACEMENT Left 06/29/2019   Procedure: INSERTION PORT-A-CATH;  Surgeon: Aviva Signs, MD;  Location: [facility];  Service: General;  Laterality: Left;  [name] Kitchen VIDEO BRONCHOSCOPY Bilateral 04/29/2019   Procedure: VIDEO BRONCHOSCOPY WITH FLUORO;  Surgeon: Laurin Coder, MD;  Location: [facility];  Service: Endoscopy;  Laterality: Bilateral;  . VIDEO BRONCHOSCOPY Bilateral 05/03/2019   Procedure: VIDEO BRONCHOSCOPY WITHOUT FLUORO;  Surgeon: Rigoberto Noel, MD;  Location: [facility];  Service: Cardiopulmonary;  Laterality: Bilateral;    SOCIAL HISTORY:  Social History   Socioeconomic History  . Marital status: Single    Spouse name: Not on file  . Number of children: Not on file  . Years of education: Not on file  . Highest education level: Not on file  Occupational History  . Not on file  Tobacco Use  . Smoking status: Current Every Day Smoker    Packs/day: 1.00    Types: Cigarettes  . Smokeless tobacco: Never Used  . Tobacco comment: quit cigs 5 months ago  Substance and Sexual Activity  . Alcohol use: Yes    Alcohol/week: 6.0 standard drinks    Types: 6 Cans of beer per week    Comment: pt drinks 3 large beers daily. hasnt had any in 5 months  . Drug use: Yes    Types: Marijuana    Comment: daily. as of Jan 18,2021 hasnt had any in 5 months  . Sexual activity: Not on file  Other Topics Concern  . Not on file  Social History Narrative  . Not on file   Social  Determinants of Health   Financial Resource Strain: Low Risk   . Difficulty of Paying Living Expenses: Not very hard  Food Insecurity: No Food Insecurity  . Worried About Charity fundraiser in the Last Year: Never true  . Ran Out of Food in the Last Year: Never true  Transportation Needs: No Transportation Needs  . Lack of Transportation (Medical): No  . Lack of Transportation (Non-Medical): No  Physical Activity: Inactive  . Days of Exercise per Week: 0 days  . Minutes of Exercise per Session: 0 min  Stress: No Stress Concern Present  . Feeling of Stress : Only a little  Social Connections: Somewhat Isolated  . Frequency of Communication with Friends and Family: More than three times a week  . Frequency of Social Gatherings with Friends and Family: More than three times a week  . Attends Religious Services: 1 to 4 times per year  . Active Member of Clubs or Organizations: No  . Attends Archivist Meetings: Never  . Marital Status: Never married  Intimate Partner Violence: Not At Risk  . Fear of Current or Ex-Partner: No  .  Emotionally Abused: No  . Physically Abused: No  . Sexually Abused: No    FAMILY HISTORY:  Family History  Problem Relation Age of Onset  . Breast cancer Mother   . Cirrhosis Father   . Alcohol abuse Father   . Hypertension Sister   . Cancer Brother   . Hypertension Sister   . Hypertension Sister   . Hypertension Sister   . Hypertension Sister     CURRENT MEDICATIONS:  Current Outpatient Medications  Medication Sig Dispense Refill  . CARBOPLATIN IV Inject into the vein every 21 ( twenty-one) days. Every 3 weeks x 4 cycles    . ferrous sulfate 325 (65 FE) MG EC tablet Take 325 mg by mouth daily.    . folic acid (FOLVITE) 1 MG tablet Take 1 tablet (1 mg total) by mouth daily. 30 tablet 3  . levETIRAcetam (KEPPRA) 500 MG tablet Take 1 tablet (500 mg total) by mouth 2 (two) times daily. 60 tablet 3  . magnesium oxide (MAG-OX) 400 (241.3 Mg)  MG tablet Take 1 tablet (400 mg total) by mouth 2 (two) times daily. 60 tablet 3  . PEMBROLIZUMAB IV Inject into the vein every 21 ( twenty-one) days.    [name] Kitchen PEMEtrexed 500 mg/m2 in sodium chloride 0.9 % 100 mL Inject 500 mg/m2 into the vein every 21 ( twenty-one) days. Every 3 weeks x 4 cycles    . acetaminophen (TYLENOL) 500 MG tablet Take 1,000 mg by mouth every 6 (six) hours as needed for moderate pain or headache.     . lidocaine-prilocaine (EMLA) cream Apply a small amount to port a cath site and cover with plastic wrap 1 hour prior to chemotherapy appointments (Patient not taking: Reported on 11/01/2019) 30 g 3  . polyethylene glycol (MIRALAX / GLYCOLAX) 17 g packet Take 17 g by mouth daily as needed. (Patient not taking: Reported on 11/01/2019) 14 each 0  . prochlorperazine (COMPAZINE) 10 MG tablet Take 1 tablet (10 mg total) by mouth every 6 (six) hours as needed (Nausea or vomiting). (Patient not taking: Reported on 11/01/2019) 30 tablet 1  . traMADol (ULTRAM) 50 MG tablet TAKE 1 TABLET BY MOUTH AT BEDTIME AS NEEDED (Patient not taking: Reported on 11/01/2019) 30 tablet 0   No current facility-administered medications for this visit.    ALLERGIES:  No Known Allergies  PHYSICAL EXAM:  Performance status (ECOG): 1 - Symptomatic but completely ambulatory  Vitals:   11/01/19 0949 11/01/19 1006  BP: 105/64 105/64  Pulse: 75 75  Resp: 18 18  Temp: (!) 97.2 F (36.2 C) (!) 97.2 F (36.2 C)  SpO2: 100% 100%   Wt Readings from Last 3 Encounters:  11/01/19 134 lb 6.4 oz (61 kg)  10/11/19 133 lb 6.4 oz (60.5 kg)  09/20/19 134 lb 11.2 oz (61.1 kg)   Physical Exam Constitutional:      Appearance: Normal appearance.  HENT:     Nose: No congestion.     Mouth/Throat:     Mouth: Mucous membranes are moist.  Eyes:     Extraocular Movements: Extraocular movements intact.     Pupils: Pupils are equal, round, and reactive to light.  Cardiovascular:     Rate and Rhythm: Normal rate and  regular rhythm.     Heart sounds: No murmur. No gallop.   Pulmonary:     Breath sounds: No wheezing, rhonchi or rales.  Abdominal:     Tenderness: There is no abdominal tenderness.  Musculoskeletal:  General: No tenderness.     Cervical back: Normal range of motion. No tenderness.     Right lower leg: No edema.     Left lower leg: No edema.  Skin:    General: Skin is warm and dry.     Findings: No bruising, erythema or rash.  Neurological:     Mental Status: He is alert and oriented to person, place, and time.     Sensory: No sensory deficit.     Motor: No weakness.  Psychiatric:        Mood and Affect: Mood normal.        Behavior: Behavior normal.        Thought Content: Thought content normal.        Judgment: Judgment normal.     LABORATORY DATA:  I have reviewed the labs as listed.  CBC Latest Ref Rng & Units 11/01/2019 10/11/2019 09/20/2019  WBC 4.0 - 10.5 K/uL 4.1 3.0(L) 2.9(L)  Hemoglobin 13.0 - 17.0 g/dL 8.7(L) 8.6(L) 8.3(L)  Hematocrit 39.0 - 52.0 % 27.5(L) 27.3(L) 26.5(L)  Platelets 150 - 400 K/uL 295 237 259   CMP Latest Ref Rng & Units 11/01/2019 10/11/2019 09/20/2019  Glucose 70 - 99 mg/dL 156(H) 115(H) 116(H)  BUN 6 - 20 mg/dL 12 8 9   Creatinine 0.61 - 1.24 mg/dL 0.50(L) 0.63 0.56(L)  Sodium 135 - 145 mmol/L 136 139 140  Potassium 3.5 - 5.1 mmol/L 3.4(L) 3.5 3.4(L)  Chloride 98 - 111 mmol/L 98 102 105  CO2 22 - 32 mmol/L 26 24 25   Calcium 8.9 - 10.3 mg/dL 9.9 10.1 9.9  Total Protein 6.5 - 8.1 g/dL 8.1 7.9 7.6  Total Bilirubin 0.3 - 1.2 mg/dL 0.5 0.4 0.6  Alkaline Phos 38 - 126 U/L 147(H) 140(H) 125  AST 15 - 41 U/L 22 20 19   ALT 0 - 44 U/L 11 11 11     DIAGNOSTIC IMAGING:  I reviewed scans.  ASSESSMENT & PLAN:  1. 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 -4 cycles of carboplatin, pemetrexed and pembrolizumab from 07/05/2019 through 09/20/2019. -CT scan on 09/07/2019 showed  improved lung lesion.  L4 compression fracture from previous metastatic disease. -Bone scan was stable. -Maintenance pemetrexed and pembrolizumab started on 10/11/2019.  2.  Brain metastasis: -Presentation with right arm weakness with brain MRI on 04/28/2019 showing widespread metastatic disease in the brain, approximately 22 lesions. -WBRT completed on 05/19/2019. -MRI on 08/11/2019 showed interval decrease in size of multiple enhancing lesions scattered throughout the brain parenchyma.  Decrease in vasogenic edema.  No new lesions.  3.  Back pain: -Completed XRT to the bone meta stasis on 06/14/2019.  PLAN:  1.  Metastatic lung adenocarcinoma: -He has tolerated last maintenance treatment very well.  I have reviewed his labs.  White count and LFTs are adequate to proceed with his next treatment today. -We will plan to repeat CT scan in 3 months from the last. -He was told to continue folic acid daily.  We will reevaluate him in 3 weeks.  2.  Brain metastasis: -He will continue to have brain MRI every 3 months.  3.  Back pain: -Continue tramadol twice daily as needed.  4.  Macrocytic anemia: -Hemoglobin today is 8.7 MCV is 104. -Iron panel on 07/26/2019 shows ferritin 631, percent saturation 11.  Folic acid and X65 was normal.   Orders placed this encounter:  No orders of the defined types were  placed in this encounter.    Derek Jack, MD, 11/01/19 10:09 AM  [place] [phone]   I, Jacqualyn Posey, am acting as a scribe for Dr. Sanda Linger.  I, Derek Jack MD, have reviewed the above documentation for accuracy and completeness, and I agree with the above.

## 2019-11-01 NOTE — Progress Notes (Signed)

## 2019-11-01 NOTE — Patient Instructions (Signed)
[place] at [facility] Discharge Instructions  You were seen today by [name]. He went over your recent results. Continue to take your folic acid and B vitamin every day. He will see you back in for labs and follow up.   Thank you for choosing [place] at [facility] to provide your oncology and hematology care.  To afford each patient quality time with our provider, please arrive at least 15 minutes before your scheduled appointment time.   If you have a lab appointment with the [facility] please come in thru the  Main Entrance and check in at the main information desk  You need to re-schedule your appointment should you arrive 10 or more minutes late.  We strive to give you quality time with our providers, and arriving late affects you and other patients whose appointments are after yours.  Also, if you no show three or more times for appointments you may be dismissed from the clinic at the providers discretion.     Again, thank you for choosing [facility].  Our hope is that these requests will decrease the amount of time that you wait before being seen by our physicians.       _____________________________________________________________  Should you have questions after your visit to [facility], please contact our office at [phone] between the hours of 8:00 a.m. and 4:30 p.m.  Voicemails left after 4:00 p.m. will not be returned until the following business day.  For prescription refill requests, have your pharmacy contact our office and allow 72 hours.    Cancer Center Support Programs:   > Cancer Support Group  2nd Tuesday of the month 1pm-2pm, Journey Room

## 2019-11-01 NOTE — Progress Notes (Signed)
Patient tolerated chemotherapy with no complaints voiced.  Side effects with management reviewed with understanding verbalized.  Port site clean and dry with no bruising or swelling noted at site.  Good blood return noted before and after administration of chemotherapy.  Band aid applied.  Patient left ambulatory with VSS and no s/s of distress noted.

## 2019-11-01 NOTE — Progress Notes (Signed)
Patient has been assessed, vital signs and labs have been reviewed by [name]. ANC, Creatinine, LFTs, and Platelets are within treatment parameters, potassium is low please give 20 mEq PO potassium today per [name]. The patient is good to proceed with treatment at this time.

## 2019-11-02 MED ORDER — LANREOTIDE ACETATE 120 MG/0.5ML ~~LOC~~ SOLN
SUBCUTANEOUS | Status: AC
  Filled 2019-11-02: qty 120

## 2019-11-02 MED ORDER — CYANOCOBALAMIN 1000 MCG/ML IJ SOLN
INTRAMUSCULAR | Status: AC
  Filled 2019-11-02: qty 1

## 2019-11-08 ENCOUNTER — Other Ambulatory Visit: Payer: Self-pay | Admitting: Radiation Therapy

## 2019-11-12 ENCOUNTER — Other Ambulatory Visit: Payer: Self-pay

## 2019-11-12 ENCOUNTER — Ambulatory Visit (HOSPITAL_COMMUNITY)
Admission: RE | Admit: 2019-11-12 | Discharge: 2019-11-12 | Disposition: A | Discharge status: Home / Self Care | Payer: Medicaid Other | Source: Ambulatory Visit | Attending: Internal Medicine | Admitting: Internal Medicine

## 2019-11-12 DIAGNOSIS — C7931 Secondary malignant neoplasm of brain: Secondary | ICD-10-CM | POA: Diagnosis not present

## 2019-11-12 MED ORDER — CHLORHEXIDINE GLUCONATE CLOTH 2 % EX PADS: 6.0000 | MEDICATED_PAD | Freq: Every day | CUTANEOUS | Status: DC

## 2019-11-12 MED ORDER — GADOBUTROL 1 MMOL/ML IV SOLN
6.0000 mL | Freq: Once | INTRAVENOUS | Status: AC | PRN
  Administered 2019-11-12: 6 mL via INTRAVENOUS

## 2019-11-12 MED ORDER — SODIUM CHLORIDE 0.9% FLUSH: 10.0000 mL | INTRAVENOUS | Status: DC | PRN

## 2019-11-12 MED ORDER — HEPARIN SOD (PORK) LOCK FLUSH 100 UNIT/ML IV SOLN
500.0000 [IU] | INTRAVENOUS | Status: DC | PRN
  Filled 2019-11-12: qty 5

## 2019-11-13 ENCOUNTER — Other Ambulatory Visit (HOSPITAL_COMMUNITY): Payer: Self-pay | Admitting: Hematology

## 2019-11-14 ENCOUNTER — Inpatient Hospital Stay: Payer: Medicaid Other | Attending: Radiation Oncology | Admitting: Internal Medicine

## 2019-11-14 ENCOUNTER — Other Ambulatory Visit (HOSPITAL_COMMUNITY): Payer: Self-pay | Admitting: *Deleted

## 2019-11-14 ENCOUNTER — Other Ambulatory Visit: Payer: Self-pay

## 2019-11-14 ENCOUNTER — Inpatient Hospital Stay: Payer: Medicaid Other

## 2019-11-14 ENCOUNTER — Telehealth: Payer: Self-pay | Admitting: Internal Medicine

## 2019-11-14 VITALS — BP 112/76 | HR 82 | Temp 97.9°F | Resp 18 | Ht 72.0 in | Wt 138.7 lb

## 2019-11-14 DIAGNOSIS — C3411 Malignant neoplasm of upper lobe, right bronchus or lung: Secondary | ICD-10-CM | POA: Insufficient documentation

## 2019-11-14 DIAGNOSIS — C7931 Secondary malignant neoplasm of brain: Secondary | ICD-10-CM | POA: Diagnosis not present

## 2019-11-14 MED ORDER — LEVETIRACETAM 500 MG PO TABS: 500.0000 mg | ORAL_TABLET | Freq: Two times a day (BID) | ORAL | 3 refills | Status: AC

## 2019-11-14 MED ORDER — FOLIC ACID 1 MG PO TABS: 1.0000 mg | ORAL_TABLET | Freq: Every day | ORAL | 3 refills | Status: DC

## 2019-11-14 MED ORDER — PANTOPRAZOLE SODIUM 40 MG PO TBEC: 40.0000 mg | DELAYED_RELEASE_TABLET | Freq: Every day | ORAL | 3 refills | Status: DC

## 2019-11-14 NOTE — Telephone Encounter (Signed)
Scheduled appt per 6/7 los.  Printed and mailed appt calendar.

## 2019-11-14 NOTE — Progress Notes (Signed)
[name] [facility] [name],  [address] [phone]   Interval Evaluation  Date of Service: 11/14/19 Patient Name: Brandon Ferguson Patient MRN: [id number] Patient DOB: [birth year] Provider: Ventura Sellers, MD  Identifying Statement:  Brandon Ferguson is a 59 y.o. 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 ([place])   05/19/2019 Cancer Staging   Staging form: Lung, AJCC 8th Edition - Clinical stage from 05/19/2019: Stage IVB (cT2b, cN1, pM1c) - Signed by Derek Jack, MD on 05/19/2019   07/05/2019 -  Chemotherapy   The patient had palonosetron (ALOXI) injection 0.25 mg, 0.25 mg, Intravenous,  Once, 4 of 4 cycles Administration: 0.25 mg (07/05/2019), 0.25 mg (08/16/2019), 0.25 mg (09/20/2019), 0.25 mg (07/26/2019) PEMEtrexed (ALIMTA) 900 mg in sodium chloride 0.9 % 100 mL chemo infusion, 500 mg/m2 = 900 mg, Intravenous,  Once, 6 of 7 cycles Dose modification: 400 mg/m2 (80 % of original dose 500 mg/m2, Cycle 4, Reason: Provider Judgment), 500 mg/m2 (100 % of original dose 500 mg/m2, Cycle 5, Reason: Provider Judgment) Administration: 900 mg (07/05/2019), 900 mg (08/16/2019), 700 mg (09/20/2019), 900 mg (10/11/2019), 900 mg (11/01/2019), 900 mg (07/26/2019) CARBOplatin (PARAPLATIN) 580 mg in sodium chloride 0.9 % 250 mL chemo infusion, 580 mg (100 % of original dose 581.5 mg), Intravenous,  Once, 4 of 4 cycles Dose modification:   (original dose 581.5 mg, Cycle 1),   (original dose 581.5 mg, Cycle 3),   (original dose 465.2 mg, Cycle 4, Reason: Provider Judgment),   (original dose 581.5 mg, Cycle 2) Administration: 580 mg (07/05/2019), 580 mg (08/16/2019), 470 mg (09/20/2019), 580 mg (07/26/2019) fosaprepitant (EMEND) 150 mg in sodium chloride 0.9 %  145 mL IVPB, 150 mg, Intravenous,  Once, 4 of 4 cycles Administration: 150 mg (07/05/2019), 150 mg (08/16/2019), 150 mg (09/20/2019), 150 mg (07/26/2019) pembrolizumab (KEYTRUDA) 200 mg in sodium chloride 0.9 % 50 mL chemo infusion, 200 mg, Intravenous, Once, 6 of 7 cycles Administration: 200 mg (07/05/2019), 200 mg (08/16/2019), 200 mg (09/20/2019), 200 mg (10/11/2019), 200 mg (11/01/2019), 200 mg (07/26/2019)  for chemotherapy treatment.     CNS Oncologic History: 06/14/19: Completes WBRT with Dr. Lisbeth Renshaw  Interval History:  Brandon Ferguson presents for follow up after recent MRI brain.  He describes no new or progressive neurologic deficits.  No further seizures.  He remains active and mostly independent at home.  Tolerating chemotherapy well in recent months.  H+P (08/15/19) Patient presents to clinic for follow up after recent brain MRI study.  He completed his radiation ~2 months ago without complication.  He has not experienced any seizures since his initial event (right sided shaking for 8 minutes) several months ago.  Continues on Keppra twice per day.  Lives mostly alone, able to mostly manage finances but needs some help with that and his medications.  No longer working since cancer diagnosis.  Gets tired and dizzy on chemotherapy days and shortly thereafter.   Medications: Current Outpatient Medications on File Prior to Visit  Medication Sig Dispense Refill  . acetaminophen (TYLENOL) 500 MG tablet Take 1,000 mg by mouth every 6 (six) hours as needed for moderate pain or headache.     . CARBOPLATIN IV Inject into the vein every 21 ( twenty-one) days. Every 3 weeks x  4 cycles    . ferrous sulfate 325 (65 FE) MG EC tablet Take 325 mg by mouth daily.    . folic acid (FOLVITE) 1 MG tablet Take 1 tablet (1 mg total) by mouth daily. 30 tablet 3  . levETIRAcetam (KEPPRA) 500 MG tablet Take 1 tablet (500 mg total) by mouth 2 (two) times daily. 60 tablet 3  . lidocaine-prilocaine (EMLA) cream Apply a small  amount to port a cath site and cover with plastic wrap 1 hour prior to chemotherapy appointments (Patient not taking: Reported on 11/01/2019) 30 g 3  . magnesium oxide (MAG-OX) 400 (241.3 Mg) MG tablet Take 1 tablet (400 mg total) by mouth 2 (two) times daily. 60 tablet 3  . PEMBROLIZUMAB IV Inject into the vein every 21 ( twenty-one) days.    [name] Kitchen PEMEtrexed 500 mg/m2 in sodium chloride 0.9 % 100 mL Inject 500 mg/m2 into the vein every 21 ( twenty-one) days. Every 3 weeks x 4 cycles    . polyethylene glycol (MIRALAX / GLYCOLAX) 17 g packet Take 17 g by mouth daily as needed. (Patient not taking: Reported on 11/01/2019) 14 each 0  . prochlorperazine (COMPAZINE) 10 MG tablet Take 1 tablet (10 mg total) by mouth every 6 (six) hours as needed (Nausea or vomiting). (Patient not taking: Reported on 11/01/2019) 30 tablet 1  . traMADol (ULTRAM) 50 MG tablet TAKE 1 TABLET BY MOUTH AT BEDTIME AS NEEDED (Patient not taking: Reported on 11/01/2019) 30 tablet 0   No current facility-administered medications on file prior to visit.    Allergies: No Known Allergies Past Medical History:  Past Medical History:  Diagnosis Date  . Cancer ([place])   . GERD (gastroesophageal reflux disease)   . Iron deficiency   . Port-A-Cath in place 06/17/2019  . Vitamin D deficiency    Past Surgical History:  Past Surgical History:  Procedure Laterality Date  . KNEE SURGERY    . PORTACATH PLACEMENT Left 06/29/2019   Procedure: INSERTION PORT-A-CATH;  Surgeon: Aviva Signs, MD;  Location: [facility];  Service: General;  Laterality: Left;  [name] Kitchen VIDEO BRONCHOSCOPY Bilateral 04/29/2019   Procedure: VIDEO BRONCHOSCOPY WITH FLUORO;  Surgeon: Laurin Coder, MD;  Location: [facility] END[facility];  Service: Endoscopy;  Laterality: Bilateral;  . VIDEO BRONCHOSCOPY Bilateral 05/03/2019   Procedure: VIDEO BRONCHOSCOPY WITHOUT FLUORO;  Surgeon: Rigoberto Noel, MD;  Location: [facility];  Service: Cardiopulmonary;  Laterality: Bilateral;   Social  History:  Social History   Socioeconomic History  . Marital status: Single    Spouse name: Not on file  . Number of children: Not on file  . Years of education: Not on file  . Highest education level: Not on file  Occupational History  . Not on file  Tobacco Use  . Smoking status: Current Every Day Smoker    Packs/day: 1.00    Types: Cigarettes  . Smokeless tobacco: Never Used  . Tobacco comment: quit cigs 5 months ago  Substance and Sexual Activity  . Alcohol use: Yes    Alcohol/week: 6.0 standard drinks    Types: 6 Cans of beer per week    Comment: pt drinks 3 large beers daily. hasnt had any in 5 months  . Drug use: Yes    Types: Marijuana    Comment: daily. as of Jan 18,2021 hasnt had any in 5 months  . Sexual activity: Not on file  Other Topics Concern  . Not on file  Social History Narrative  . Not on  file   Social Determinants of Health   Financial Resource Strain: Low Risk   . Difficulty of Paying Living Expenses: Not very hard  Food Insecurity: No Food Insecurity  . Worried About Charity fundraiser in the Last Year: Never true  . Ran Out of Food in the Last Year: Never true  Transportation Needs: No Transportation Needs  . Lack of Transportation (Medical): No  . Lack of Transportation (Non-Medical): No  Physical Activity: Inactive  . Days of Exercise per Week: 0 days  . Minutes of Exercise per Session: 0 min  Stress: No Stress Concern Present  . Feeling of Stress : Only a little  Social Connections: Somewhat Isolated  . Frequency of Communication with Friends and Family: More than three times a week  . Frequency of Social Gatherings with Friends and Family: More than three times a week  . Attends Religious Services: 1 to 4 times per year  . Active Member of Clubs or Organizations: No  . Attends Archivist Meetings: Never  . Marital Status: Never married  Intimate Partner Violence: Not At Risk  . Fear of Current or Ex-Partner: No  .  Emotionally Abused: No  . Physically Abused: No  . Sexually Abused: No   Family History:  Family History  Problem Relation Age of Onset  . Breast cancer Mother   . Cirrhosis Father   . Alcohol abuse Father   . Hypertension Sister   . Cancer Brother   . Hypertension Sister   . Hypertension Sister   . Hypertension Sister   . Hypertension Sister     Review of Systems: Constitutional: Doesn't report fevers, chills or abnormal weight loss Eyes: Doesn't report blurriness of vision Ears, nose, mouth, throat, and face: Doesn't report sore throat Respiratory: Doesn't report cough, dyspnea or wheezes Cardiovascular: Doesn't report palpitation, chest discomfort  Gastrointestinal:  Doesn't report nausea, constipation, diarrhea GU: Doesn't report incontinence Skin: Doesn't report skin rashes Neurological: Per HPI Musculoskeletal: Doesn't report joint pain Behavioral/Psych: Doesn't report anxiety  Physical Exam: Vitals:   11/14/19 1058  BP: 112/76  Pulse: 82  Resp: 18  Temp: 97.9 F (36.6 C)  SpO2: 100%   KPS: 80. General: Alert, cooperative, pleasant, in no acute distress Head: Normal EENT: No conjunctival injection or scleral icterus.  Lungs: Resp effort normal Cardiac: Regular rate Abdomen: Non-distended abdomen Skin: No rashes cyanosis or petechiae. Extremities: No clubbing or edema  Neurologic Exam: Mental Status: Awake, alert, attentive to examiner. Oriented to self and environment. Language is fluent with intact comprehension. Age advanced pyschomotor slowing Cranial Nerves: Visual acuity is grossly normal. Visual fields are full. Extra-ocular movements intact. No ptosis. Face is symmetric Motor: Tone and bulk are normal. Power is full in both arms and legs. Reflexes are symmetric, no pathologic reflexes present.  Sensory: Intact to light touch Gait: Normal.   Labs: I have reviewed the data as listed    Component Value Date/Time   NA 136 11/01/2019 0930   K  3.4 (L) 11/01/2019 0930   CL 98 11/01/2019 0930   CO2 26 11/01/2019 0930   GLUCOSE 156 (H) 11/01/2019 0930   BUN 12 11/01/2019 0930   CREATININE 0.50 (L) 11/01/2019 0930   CALCIUM 9.9 11/01/2019 0930   PROT 8.1 11/01/2019 0930   ALBUMIN 3.7 11/01/2019 0930   AST 22 11/01/2019 0930   ALT 11 11/01/2019 0930   ALKPHOS 147 (H) 11/01/2019 0930   BILITOT 0.5 11/01/2019 0930   GFRNONAA >60 11/01/2019  0930   GFRAA >60 11/01/2019 0930   Lab Results  Component Value Date   WBC 4.1 11/01/2019   NEUTROABS 2.9 11/01/2019   HGB 8.7 (L) 11/01/2019   HCT 27.5 (L) 11/01/2019   MCV 104.6 (H) 11/01/2019   PLT 295 11/01/2019    Imaging:  MR Brain W Wo Contrast  Result Date: 11/14/2019 CLINICAL DATA:  Metastatic cancer to brain. Brain/CNS neoplasm, assess treatment response. Additional history obtained from electronic medical records: Metastatic adenocarcinoma of the lung, whole brain radiation completed 05/19/2019. EXAM: MRI HEAD WITHOUT AND WITH CONTRAST TECHNIQUE: Multiplanar, multiecho pulse sequences of the brain and surrounding structures were obtained without and with intravenous contrast. CONTRAST:  49mL GADAVIST GADOBUTROL 1 MMOL/ML IV SOLN COMPARISON:  Prior brain MRI examinations 08/11/2019 and earlier. FINDINGS: Brain: There has been an interval increase in size of a transfalcine enhancing lesion now measuring 24 mm (previously 15 mm) (series 12, image 20). The numerous remaining supratentorial and infratentorial intracranial metastases have either remained stable or decreased in size as compared to prior MRI 08/11/2019. Index lesions are as follows. 9 mm peripherally enhancing lesion within the posterior left frontal lobe (series 10, image 128). 5 mm peripherally enhancing lesion within the posterior right frontal lobe (series 10, image 135). 5 mm lesion within the posterior right frontal lobe (series 10, image 108). 15 mm peripherally enhancing lesion within the mid left frontal lobe (series 10,  image 109). No definitively new lesion is identified. Chronic blood products at the site of several of the above described lesions. White matter T2/FLAIR hyperintensity within the bilateral frontoparietal regions has not significantly changed. Vasogenic edema surrounding a left frontal lobe metastasis more anteriorly has significantly decreased. Remaining patchy T2/FLAIR hyperintensity within the cerebral white matter is unchanged. There is no acute infarct. No extra-axial fluid collection. No midline shift. Vascular: Expected proximal arterial flow voids. Skull and upper cervical spine: Unchanged nonspecific patchy T1 hypointensity within the visualized upper cervical spine. Sinuses/Orbits: Visualized orbits show no acute finding. Mild paranasal sinus mucosal thickening. Small right maxillary sinus mucous retention cyst. No significant mastoid effusion. IMPRESSION: A 24 mm enhancing transfalcine lesion has increased in size as compared to brain MRI 08/11/2019 (previously measuring 15 mm). The numerous remaining supratentorial and infratentorial intracranial metastases have either remained stable or decreased in size since this prior study. No definitively new lesion is identified. White matter T2/FLAIR hyperintensity within the bilateral frontoparietal regions has not significantly changed. Vasogenic edema surrounding a left frontal lobe metastasis more anteriorly has significantly decreased. Remaining patchy T2 hyperintense signal changes within the cerebral white matter are stable. Electronically Signed   By: Kellie Simmering DO   On: 11/14/2019 09:52    [place] Clinician Interpretation: I have personally reviewed the radiological images as listed.  My interpretation, in the context of the patient's clinical presentation, is stable disease   Assessment/Plan Metastatic cancer to brain [facility]) [C79.31]  Yahir Tavano is clinically and radiographically stable today.    He is seizure free and should continue on  Keppra 500mg  BID.  He should return to clinic in 3 months following next MRI brain for evaluation.  We spent twenty additional minutes teaching regarding the natural history, biology, and historical experience in the treatment of neurologic complications of cancer.   We appreciate the opportunity to participate in the care of Jr Milliron.   All questions were answered. The patient knows to call the clinic with any problems, questions or concerns. No barriers to learning were detected.  The  total time spent in the encounter was 30 minutes and more than 50% was on counseling and review of test results   Ventura Sellers, MD Medical Director of Neuro-Oncology [facility] [place] 11/14/19 10:52 AM

## 2019-11-21 ENCOUNTER — Telehealth: Payer: Self-pay | Admitting: Internal Medicine

## 2019-11-21 NOTE — Telephone Encounter (Signed)
Release C.N:[id number] [place] @ [phone]

## 2019-11-22 ENCOUNTER — Inpatient Hospital Stay (HOSPITAL_BASED_OUTPATIENT_CLINIC_OR_DEPARTMENT_OTHER): Payer: Medicaid Other | Admitting: Hematology

## 2019-11-22 ENCOUNTER — Inpatient Hospital Stay (HOSPITAL_COMMUNITY): Payer: Medicaid Other

## 2019-11-22 ENCOUNTER — Other Ambulatory Visit: Payer: Self-pay

## 2019-11-22 ENCOUNTER — Inpatient Hospital Stay (HOSPITAL_COMMUNITY): Payer: Medicaid Other | Attending: Hematology

## 2019-11-22 VITALS — BP 112/65 | HR 82 | Temp 97.0°F | Resp 18 | Wt 136.8 lb

## 2019-11-22 VITALS — BP 122/64 | HR 69 | Temp 97.2°F | Resp 18

## 2019-11-22 DIAGNOSIS — Z5112 Encounter for antineoplastic immunotherapy: Secondary | ICD-10-CM | POA: Diagnosis present

## 2019-11-22 DIAGNOSIS — C7951 Secondary malignant neoplasm of bone: Secondary | ICD-10-CM | POA: Diagnosis not present

## 2019-11-22 DIAGNOSIS — C3411 Malignant neoplasm of upper lobe, right bronchus or lung: Secondary | ICD-10-CM

## 2019-11-22 DIAGNOSIS — C7931 Secondary malignant neoplasm of brain: Secondary | ICD-10-CM | POA: Diagnosis not present

## 2019-11-22 DIAGNOSIS — Z5111 Encounter for antineoplastic chemotherapy: Secondary | ICD-10-CM | POA: Insufficient documentation

## 2019-11-22 DIAGNOSIS — M549 Dorsalgia, unspecified: Secondary | ICD-10-CM | POA: Insufficient documentation

## 2019-11-22 DIAGNOSIS — D649 Anemia, unspecified: Secondary | ICD-10-CM | POA: Insufficient documentation

## 2019-11-22 DIAGNOSIS — Z95828 Presence of other vascular implants and grafts: Secondary | ICD-10-CM

## 2019-11-22 LAB — COMPREHENSIVE METABOLIC PANEL
ALT: 11 U/L (ref 0–44)
AST: 23 U/L (ref 15–41)
Albumin: 3.7 g/dL (ref 3.5–5.0)
Alkaline Phosphatase: 150 U/L — ABNORMAL HIGH (ref 38–126)
Anion gap: 12 (ref 5–15)
BUN: 9 mg/dL (ref 6–20)
CO2: 24 mmol/L (ref 22–32)
Calcium: 9.8 mg/dL (ref 8.9–10.3)
Chloride: 102 mmol/L (ref 98–111)
Creatinine, Ser: 0.47 mg/dL — ABNORMAL LOW (ref 0.61–1.24)
GFR calc Af Amer: >60 mL/min (ref >60)
GFR calc non Af Amer: >60 mL/min (ref >60)
Glucose, Bld: 100 mg/dL — ABNORMAL HIGH (ref 70–99)
Potassium: 3.3 mmol/L — ABNORMAL LOW (ref 3.5–5.1)
Sodium: 138 mmol/L (ref 135–145)
Total Bilirubin: 0.4 mg/dL (ref 0.3–1.2)
Total Protein: 8.2 g/dL — ABNORMAL HIGH (ref 6.5–8.1)

## 2019-11-22 LAB — CBC WITH DIFFERENTIAL/PLATELET
Abs Immature Granulocytes: 0.01 10*3/uL (ref 0.00–0.07)
Basophils Absolute: 0 10*3/uL (ref 0.0–0.1)
Basophils Relative: 0 %
Eosinophils Absolute: 0.1 10*3/uL (ref 0.0–0.5)
Eosinophils Relative: 1 %
HCT: 27.6 % — ABNORMAL LOW (ref 39.0–52.0)
Hemoglobin: 8.5 g/dL — ABNORMAL LOW (ref 13.0–17.0)
Immature Granulocytes: 0 %
Lymphocytes Relative: 18 %
Lymphs Abs: 0.9 10*3/uL (ref 0.7–4.0)
MCH: 32 pg (ref 26.0–34.0)
MCHC: 30.8 g/dL (ref 30.0–36.0)
MCV: 103.8 fL — ABNORMAL HIGH (ref 80.0–100.0)
Monocytes Absolute: 0.7 10*3/uL (ref 0.1–1.0)
Monocytes Relative: 14 %
Neutro Abs: 3.2 10*3/uL (ref 1.7–7.7)
Neutrophils Relative %: 67 %
Platelets: 324 10*3/uL (ref 150–400)
RBC: 2.66 MIL/uL — ABNORMAL LOW (ref 4.22–5.81)
RDW: 13.8 % (ref 11.5–15.5)
WBC: 4.8 10*3/uL (ref 4.0–10.5)
nRBC: 0 % (ref 0.0–0.2)

## 2019-11-22 MED ORDER — SODIUM CHLORIDE 0.9% FLUSH
10.0000 mL | INTRAVENOUS | Status: DC | PRN
  Administered 2019-11-22: 10 mL

## 2019-11-22 MED ORDER — HEPARIN SOD (PORK) LOCK FLUSH 100 UNIT/ML IV SOLN
500.0000 [IU] | Freq: Once | INTRAVENOUS | Status: AC | PRN
  Administered 2019-11-22: 500 [IU]

## 2019-11-22 MED ORDER — SODIUM CHLORIDE 0.9 % IV SOLN
200.0000 mg | Freq: Once | INTRAVENOUS | Status: AC
  Administered 2019-11-22: 200 mg via INTRAVENOUS
  Filled 2019-11-22: qty 8

## 2019-11-22 MED ORDER — PROCHLORPERAZINE MALEATE 10 MG PO TABS
10.0000 mg | ORAL_TABLET | Freq: Once | ORAL | Status: AC
  Administered 2019-11-22: 10 mg via ORAL
  Filled 2019-11-22: qty 1

## 2019-11-22 MED ORDER — CYANOCOBALAMIN 1000 MCG/ML IJ SOLN: 1000.0000 ug | Freq: Once | INTRAMUSCULAR | Status: DC

## 2019-11-22 MED ORDER — SODIUM CHLORIDE 0.9 % IV SOLN: Freq: Once | INTRAVENOUS | Status: AC

## 2019-11-22 MED ORDER — SODIUM CHLORIDE 0.9 % IV SOLN
500.0000 mg/m2 | Freq: Once | INTRAVENOUS | Status: AC
  Administered 2019-11-22: 900 mg via INTRAVENOUS
  Filled 2019-11-22: qty 36

## 2019-11-22 NOTE — Progress Notes (Signed)
[id number] Labs reviewed with and pt seen by [name] and pt approved for [facility] and Alimta infusions today per MD             [name] tolerated chemo tx well without complaints or incident. VSS upon discharge. Pt discharged self ambulatory in satisfactory condition

## 2019-11-22 NOTE — Patient Instructions (Addendum)
[facility] Discharge Instructions for Patients Receiving Chemotherapy   Beginning January 23rd 2017 lab work for the [employer] will be done in the  Main lab at [facility] on 1st floor. If you have a lab appointment with the [facility] please come in thru the  Main Entrance and check in at the main information desk   Today you received the following chemotherapy agents Keytruda and Alimta. Follow-up as scheduled  To help prevent nausea and vomiting after your treatment, we encourage you to take your nausea medication   If you develop nausea and vomiting, or diarrhea that is not controlled by your medication, call the clinic.  The clinic phone number is [phone]. Office hours are Monday-Friday 8:30am-5:00pm.  BELOW ARE SYMPTOMS THAT SHOULD BE REPORTED IMMEDIATELY:  *FEVER GREATER THAN 101.0 F  *CHILLS WITH OR WITHOUT FEVER  NAUSEA AND VOMITING THAT IS NOT CONTROLLED WITH YOUR NAUSEA MEDICATION  *UNUSUAL SHORTNESS OF BREATH  *UNUSUAL BRUISING OR BLEEDING  TENDERNESS IN MOUTH AND THROAT WITH OR WITHOUT PRESENCE OF ULCERS  *URINARY PROBLEMS  *BOWEL PROBLEMS  UNUSUAL RASH Items with * indicate a potential emergency and should be followed up as soon as possible. If you have an emergency after office hours please contact your primary care physician or go to the nearest emergency department.  Please call the clinic during office hours if you have any questions or concerns.   You may also contact the Patient Navigator at [phone] should you have any questions or need assistance in obtaining follow up care.      Resources For Cancer Patients and their Caregivers ? American Cancer Society: Can assist with transportation, wigs, general needs, runs Look Good Feel Better.        9313350627 ? Cancer Care: Provides financial assistance, online support groups, medication/co-pay assistance.  1-800-813-HOPE 780-003-7735) ? [place] Assists [place] cancer patients and their families through emotional , educational and financial support.  912-373-1449 ? [place] DSS Where to apply for food stamps, Medicaid and utility assistance. 510-586-2760 ? [facility]: Transportation to medical appointments. (914)743-9548 ? Social Security Administration: May apply for disability if have a Stage IV cancer. (562)578-9417 616-032-4007 ? [employer], Disability and Transit Services: Assists with nutrition, care and transit needs. 763-443-1487

## 2019-11-22 NOTE — Progress Notes (Signed)
Patient has been assessed, vital signs and labs have been reviewed by Dr. Katragadda. ANC, Creatinine, LFTs, and Platelets are within treatment parameters per Dr. Katragadda. The patient is good to proceed with treatment at this time.  

## 2019-11-22 NOTE — Progress Notes (Signed)
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  NGS STATUS: PD-L1 TPS 20%  CURRENT THERAPY: Maintenance Keytruda & pemetrexed  BRIEF ONCOLOGIC HISTORY:  Oncology History  Malignant neoplasm of right upper lobe of lung ([place])  05/02/2019 Initial Diagnosis   Malignant neoplasm of upper lobe of right lung ([place])   05/19/2019 Cancer Staging   Staging form: Lung, AJCC 8th Edition - Clinical stage from 05/19/2019: Stage IVB (cT2b, cN1, pM1c) - Signed by Derek Jack, MD on 05/19/2019   07/05/2019 -  Chemotherapy   The patient had palonosetron (ALOXI) injection 0.25 mg, 0.25 mg, Intravenous,  Once, 4 of 4 cycles Administration: 0.25 mg (07/05/2019), 0.25 mg (08/16/2019), 0.25 mg (09/20/2019), 0.25 mg (07/26/2019) PEMEtrexed (ALIMTA) 900 mg in sodium chloride 0.9 % 100 mL chemo infusion, 500 mg/m2 = 900 mg, Intravenous,  Once, 6 of 9 cycles Dose modification: 400 mg/m2 (80 % of original dose 500 mg/m2, Cycle 4, Reason: Provider Judgment), 500 mg/m2 (100 % of original dose 500 mg/m2, Cycle 5, Reason: Provider Judgment) Administration: 900 mg (07/05/2019), 900 mg (08/16/2019), 700 mg (09/20/2019), 900 mg (10/11/2019), 900 mg (11/01/2019), 900 mg (07/26/2019) CARBOplatin (PARAPLATIN) 580 mg in sodium chloride 0.9 % 250 mL chemo infusion, 580 mg (100 % of original dose 581.5 mg), Intravenous,  Once, 4 of 4 cycles Dose modification:   (original dose 581.5 mg, Cycle 1),   (original dose 581.5 mg, Cycle 3),   (original dose 465.2 mg, Cycle 4, Reason: Provider Judgment),   (original dose 581.5 mg, Cycle 2) Administration: 580 mg (07/05/2019), 580 mg (08/16/2019), 470 mg (09/20/2019), 580 mg (07/26/2019) fosaprepitant (EMEND) 150 mg in sodium chloride 0.9 % 145 mL IVPB, 150 mg, Intravenous,  Once, 4 of 4 cycles Administration: 150 mg (07/05/2019),  150 mg (08/16/2019), 150 mg (09/20/2019), 150 mg (07/26/2019) pembrolizumab (KEYTRUDA) 200 mg in sodium chloride 0.9 % 50 mL chemo infusion, 200 mg, Intravenous, Once, 6 of 9 cycles Administration: 200 mg (07/05/2019), 200 mg (08/16/2019), 200 mg (09/20/2019), 200 mg (10/11/2019), 200 mg (11/01/2019), 200 mg (07/26/2019)  for chemotherapy treatment.      CANCER STAGING: Cancer Staging Malignant neoplasm of right upper lobe of lung [facility]) Staging form: Lung, AJCC 8th Edition - Clinical stage from 05/19/2019: Stage IVB (cT2b, cN1, pM1c) - Signed by Derek Jack, MD on 05/19/2019   INTERVAL HISTORY:  Mr. Toni Hoffmeister, a 59 y.o. male, returns for routine follow-up and consideration for next cycle of chemotherapy. Fabrizzio was last seen on 11/01/2019.  Due for cycle #7 of maintenance Keytruda & pemetrexed today.   Overall, he tells me he has been feeling pretty well. His headaches and back pain are stable and he takes tramadol daily. He takes folic acid daily. His appetite is good and he denies weakness or vision changes.  Overall, he feels ready for next cycle of chemo today.    REVIEW OF SYSTEMS:  Review of Systems  Constitutional: Negative for appetite change and fatigue.  Musculoskeletal: Positive for back pain (stable).  Neurological: Positive for headaches (stable).  All other systems reviewed and are negative.   PAST MEDICAL/SURGICAL HISTORY:  Past Medical History:  Diagnosis Date  . Cancer ([place])   . GERD (gastroesophageal reflux disease)   . Iron deficiency   . Port-A-Cath in place 06/17/2019  . Vitamin D deficiency  Past Surgical History:  Procedure Laterality Date  . KNEE SURGERY    . PORTACATH PLACEMENT Left 06/29/2019   Procedure: INSERTION PORT-A-CATH;  Surgeon: Aviva Signs, MD;  Location: [facility];  Service: General;  Laterality: Left;  [name] Kitchen VIDEO BRONCHOSCOPY Bilateral 04/29/2019   Procedure: VIDEO BRONCHOSCOPY WITH FLUORO;  Surgeon: Laurin Coder, MD;   Location: [facility];  Service: Endoscopy;  Laterality: Bilateral;  . VIDEO BRONCHOSCOPY Bilateral 05/03/2019   Procedure: VIDEO BRONCHOSCOPY WITHOUT FLUORO;  Surgeon: Rigoberto Noel, MD;  Location: [facility];  Service: Cardiopulmonary;  Laterality: Bilateral;    SOCIAL HISTORY:  Social History   Socioeconomic History  . Marital status: Single    Spouse name: Not on file  . Number of children: Not on file  . Years of education: Not on file  . Highest education level: Not on file  Occupational History  . Not on file  Tobacco Use  . Smoking status: Current Every Day Smoker    Packs/day: 1.00    Types: Cigarettes  . Smokeless tobacco: Never Used  . Tobacco comment: quit cigs 5 months ago  Vaping Use  . Vaping Use: Never used  Substance and Sexual Activity  . Alcohol use: Yes    Alcohol/week: 6.0 standard drinks    Types: 6 Cans of beer per week    Comment: pt drinks 3 large beers daily. hasnt had any in 5 months  . Drug use: Yes    Types: Marijuana    Comment: daily. as of Jan 18,2021 hasnt had any in 5 months  . Sexual activity: Not on file  Other Topics Concern  . Not on file  Social History Narrative  . Not on file   Social Determinants of Health   Financial Resource Strain: Low Risk   . Difficulty of Paying Living Expenses: Not very hard  Food Insecurity: No Food Insecurity  . Worried About Charity fundraiser in the Last Year: Never true  . Ran Out of Food in the Last Year: Never true  Transportation Needs: No Transportation Needs  . Lack of Transportation (Medical): No  . Lack of Transportation (Non-Medical): No  Physical Activity: Inactive  . Days of Exercise per Week: 0 days  . Minutes of Exercise per Session: 0 min  Stress: No Stress Concern Present  . Feeling of Stress : Only a little  Social Connections: Moderately Isolated  . Frequency of Communication with Friends and Family: More than three times a week  . Frequency of Social Gatherings with  Friends and Family: More than three times a week  . Attends Religious Services: 1 to 4 times per year  . Active Member of Clubs or Organizations: No  . Attends Archivist Meetings: Never  . Marital Status: Never married  Intimate Partner Violence: Not At Risk  . Fear of Current or Ex-Partner: No  . Emotionally Abused: No  . Physically Abused: No  . Sexually Abused: No    FAMILY HISTORY:  Family History  Problem Relation Age of Onset  . Breast cancer Mother   . Cirrhosis Father   . Alcohol abuse Father   . Hypertension Sister   . Cancer Brother   . Hypertension Sister   . Hypertension Sister   . Hypertension Sister   . Hypertension Sister     CURRENT MEDICATIONS:  Current Outpatient Medications  Medication Sig Dispense Refill  . CARBOPLATIN IV Inject into the vein every 21 ( twenty-one) days. Every 3 weeks x 4  cycles    . ferrous sulfate 325 (65 FE) MG EC tablet Take 325 mg by mouth daily.    . folic acid (FOLVITE) 1 MG tablet Take 1 tablet (1 mg total) by mouth daily. 30 tablet 3  . levETIRAcetam (KEPPRA) 500 MG tablet Take 1 tablet (500 mg total) by mouth 2 (two) times daily. 60 tablet 3  . magnesium oxide (MAG-OX) 400 (241.3 Mg) MG tablet Take 1 tablet (400 mg total) by mouth 2 (two) times daily. 60 tablet 3  . pantoprazole (PROTONIX) 40 MG tablet Take 1 tablet by mouth once daily 30 tablet 0  . pantoprazole (PROTONIX) 40 MG tablet Take 1 tablet (40 mg total) by mouth daily. 30 tablet 3  . PEMBROLIZUMAB IV Inject into the vein every 21 ( twenty-one) days.    [name] Kitchen PEMEtrexed 500 mg/m2 in sodium chloride 0.9 % 100 mL Inject 500 mg/m2 into the vein every 21 ( twenty-one) days. Every 3 weeks x 4 cycles    . acetaminophen (TYLENOL) 500 MG tablet Take 1,000 mg by mouth every 6 (six) hours as needed for moderate pain or headache.  (Patient not taking: Reported on 11/22/2019)    . lidocaine-prilocaine (EMLA) cream Apply a small amount to port a cath site and cover with plastic  wrap 1 hour prior to chemotherapy appointments (Patient not taking: Reported on 11/22/2019) 30 g 3  . polyethylene glycol (MIRALAX / GLYCOLAX) 17 g packet Take 17 g by mouth daily as needed. (Patient not taking: Reported on 11/22/2019) 14 each 0  . prochlorperazine (COMPAZINE) 10 MG tablet Take 1 tablet (10 mg total) by mouth every 6 (six) hours as needed (Nausea or vomiting). (Patient not taking: Reported on 11/22/2019) 30 tablet 1  . traMADol (ULTRAM) 50 MG tablet TAKE 1 TABLET BY MOUTH AT BEDTIME AS NEEDED (Patient not taking: Reported on 11/22/2019) 30 tablet 0   No current facility-administered medications for this visit.    ALLERGIES:  No Known Allergies  PHYSICAL EXAM:  Performance status (ECOG): 1 - Symptomatic but completely ambulatory  Vitals:   11/22/19 0834  BP: 112/65  Pulse: 82  Resp: 18  Temp: (!) 97 F (36.1 C)  SpO2: 100%   Wt Readings from Last 3 Encounters:  11/22/19 136 lb 12.8 oz (62.1 kg)  11/14/19 138 lb 11.2 oz (62.9 kg)  11/01/19 134 lb 6.4 oz (61 kg)   Physical Exam Vitals reviewed.  Constitutional:      Appearance: Normal appearance.  Cardiovascular:     Rate and Rhythm: Normal rate and regular rhythm.     Pulses: Normal pulses.     Heart sounds: Normal heart sounds.  Pulmonary:     Effort: Pulmonary effort is normal.     Breath sounds: Normal breath sounds.  Abdominal:     Palpations: Abdomen is soft.     Tenderness: There is no abdominal tenderness.  Musculoskeletal:     Right lower leg: No edema.     Left lower leg: No edema.  Neurological:     General: No focal deficit present.     Mental Status: He is alert and oriented to person, place, and time.  Psychiatric:        Mood and Affect: Mood normal.        Behavior: Behavior normal.     LABORATORY DATA:  I have reviewed the labs as listed.  CBC Latest Ref Rng & Units 11/22/2019 11/01/2019 10/11/2019  WBC 4.0 - 10.5 K/uL 4.8 4.1 3.0(L)  Hemoglobin 13.0 - 17.0 g/dL 8.5(L) 8.7(L) 8.6(L)    Hematocrit 39 - 52 % 27.6(L) 27.5(L) 27.3(L)  Platelets 150 - 400 K/uL 324 295 237   CMP Latest Ref Rng & Units 11/22/2019 11/01/2019 10/11/2019  Glucose 70 - 99 mg/dL 100(H) 156(H) 115(H)  BUN 6 - 20 mg/dL 9 12 8   Creatinine 0.61 - 1.24 mg/dL 0.47(L) 0.50(L) 0.63  Sodium 135 - 145 mmol/L 138 136 139  Potassium 3.5 - 5.1 mmol/L 3.3(L) 3.4(L) 3.5  Chloride 98 - 111 mmol/L 102 98 102  CO2 22 - 32 mmol/L 24 26 24   Calcium 8.9 - 10.3 mg/dL 9.8 9.9 10.1  Total Protein 6.5 - 8.1 g/dL 8.2(H) 8.1 7.9  Total Bilirubin 0.3 - 1.2 mg/dL 0.4 0.5 0.4  Alkaline Phos 38 - 126 U/L 150(H) 147(H) 140(H)  AST 15 - 41 U/L 23 22 20   ALT 0 - 44 U/L 11 11 11     DIAGNOSTIC IMAGING:  I have independently reviewed the scans and discussed with the patient. MR Brain W Wo Contrast  Result Date: 11/14/2019 CLINICAL DATA:  Metastatic cancer to brain. Brain/CNS neoplasm, assess treatment response. Additional history obtained from electronic medical records: Metastatic adenocarcinoma of the lung, whole brain radiation completed 05/19/2019. EXAM: MRI HEAD WITHOUT AND WITH CONTRAST TECHNIQUE: Multiplanar, multiecho pulse sequences of the brain and surrounding structures were obtained without and with intravenous contrast. CONTRAST:  80m GADAVIST GADOBUTROL 1 MMOL/ML IV SOLN COMPARISON:  Prior brain MRI examinations 08/11/2019 and earlier. FINDINGS: Brain: There has been an interval increase in size of a transfalcine enhancing lesion now measuring 24 mm (previously 15 mm) (series 12, image 20). The numerous remaining supratentorial and infratentorial intracranial metastases have either remained stable or decreased in size as compared to prior MRI 08/11/2019. Index lesions are as follows. 9 mm peripherally enhancing lesion within the posterior left frontal lobe (series 10, image 128). 5 mm peripherally enhancing lesion within the posterior right frontal lobe (series 10, image 135). 5 mm lesion within the posterior right frontal lobe  (series 10, image 108). 15 mm peripherally enhancing lesion within the mid left frontal lobe (series 10, image 109). No definitively new lesion is identified. Chronic blood products at the site of several of the above described lesions. White matter T2/FLAIR hyperintensity within the bilateral frontoparietal regions has not significantly changed. Vasogenic edema surrounding a left frontal lobe metastasis more anteriorly has significantly decreased. Remaining patchy T2/FLAIR hyperintensity within the cerebral white matter is unchanged. There is no acute infarct. No extra-axial fluid collection. No midline shift. Vascular: Expected proximal arterial flow voids. Skull and upper cervical spine: Unchanged nonspecific patchy T1 hypointensity within the visualized upper cervical spine. Sinuses/Orbits: Visualized orbits show no acute finding. Mild paranasal sinus mucosal thickening. Small right maxillary sinus mucous retention cyst. No significant mastoid effusion. IMPRESSION: A 24 mm enhancing transfalcine lesion has increased in size as compared to brain MRI 08/11/2019 (previously measuring 15 mm). The numerous remaining supratentorial and infratentorial intracranial metastases have either remained stable or decreased in size since this prior study. No definitively new lesion is identified. White matter T2/FLAIR hyperintensity within the bilateral frontoparietal regions has not significantly changed. Vasogenic edema surrounding a left frontal lobe metastasis more anteriorly has significantly decreased. Remaining patchy T2 hyperintense signal changes within the cerebral white matter are stable. Electronically Signed   By: KKellie SimmeringDO   On: 11/14/2019 09:52     ASSESSMENT:  1. 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 -4 cycles of carboplatin, pemetrexed and pembrolizumab from 07/05/2019 through 09/20/2019. -CT scan on 09/07/2019 showed  improved lung lesion.  L4 compression fracture from previous metastatic disease. -Bone scan was stable. -Maintenance pemetrexed and pembrolizumab started on 10/11/2019.  2.  Brain metastasis: -Presentation with right arm weakness with brain MRI on 04/28/2019 showing widespread metastatic disease in the brain, approximately 22 lesions. -WBRT completed on 05/19/2019. -MRI on 08/11/2019 showed interval decrease in size of multiple enhancing lesions scattered throughout the brain parenchyma.  Decrease in vasogenic edema.  No new lesions. -MRI of the brain on 11/12/2019 showed 24 mm enhancing transfalcine lesion has increased in size compared to MRI from 08/11/2019, previously 15 mm.  Other brain lesions have remained stable or decreased in size.  Edema also improved.  3.  Back pain: -Completed XRT to the bone meta stasis on 06/14/2019.   PLAN:  1.  Metastatic lung adenocarcinoma: -He is tolerating treatments very well. -I have reviewed his CBC and LFTs which are adequate to proceed with his next cycle.  He will continue folic acid daily. -No immunotherapy related side effects.  2.  Brain metastasis: -I reviewed MRI of the brain from 11/12/2019.  24 mm enhancing transfalcine lesion has increased in size compared to MRI from 08/11/2019, previously 15 mm.  He does not have any headaches or any other deficits.  Numerous other meta stasis have remained stable or decreased in size from prior study.  Vasogenic edema also decreased. -He is following up with Dr. Mickeal Skinner.  Dr. Mickeal Skinner thought he had stable disease. -Continue Keppra 500 twice daily.  MRI brain will be repeated in 3 months.  3.  Back pain: -Continue tramadol as needed.  4.  Macrocytic anemia: -Hemoglobin today is 8.5 with MCV of 103.8. -Iron panel on 07/26/2019 showed ferritin 631, percent saturation 11.  Folic acid and T64 was normal.    Orders placed this encounter:  No orders of the defined types were placed in this  encounter.    Derek Jack, MD [place] [phone]   I, Milinda Antis, am acting as a scribe for Dr. Sanda Linger.  I, Derek Jack MD, have reviewed the above documentation for accuracy and completeness, and I agree with the above.

## 2019-11-22 NOTE — Patient Instructions (Signed)
[place] at [facility] Discharge Instructions  You were seen today by [name]. He went over your recent results. You will be scheduled for a CT scan in 3 weeks. Continue taking the folic acid. [name] will see you back after the scan for labs and follow up.   Thank you for choosing [place] at [facility] to provide your oncology and hematology care.  To afford each patient quality time with our provider, please arrive at least 15 minutes before your scheduled appointment time.   If you have a lab appointment with the [facility] please come in thru the Main Entrance and check in at the main information desk  You need to re-schedule your appointment should you arrive 10 or more minutes late.  We strive to give you quality time with our providers, and arriving late affects you and other patients whose appointments are after yours.  Also, if you no show three or more times for appointments you may be dismissed from the clinic at the providers discretion.     Again, thank you for choosing [facility].  Our hope is that these requests will decrease the amount of time that you wait before being seen by our physicians.       _____________________________________________________________  Should you have questions after your visit to [facility], please contact our office at [phone] between the hours of 8:00 a.m. and 4:30 p.m.  Voicemails left after 4:00 p.m. will not be returned until the following business day.  For prescription refill requests, have your pharmacy contact our office and allow 72 hours.    Cancer Center Support Programs:   > Cancer Support Group  2nd Tuesday of the month 1pm-2pm, Journey Room

## 2019-11-22 NOTE — Patient Instructions (Signed)
[facility] at [facility] Discharge Instructions  Labs drawn from portacath today   Thank you for choosing  Cancer Center at [facility] to provide your oncology and hematology care.  To afford each patient quality time with our provider, please arrive at least 15 minutes before your scheduled appointment time.   If you have a lab appointment with the Cancer Center please come in thru the Main Entrance and check in at the main information desk.  You need to re-schedule your appointment should you arrive 10 or more minutes late.  We strive to give you quality time with our providers, and arriving late affects you and other patients whose appointments are after yours.  Also, if you no show three or more times for appointments you may be dismissed from the clinic at the providers discretion.     Again, thank you for choosing [facility].  Our hope is that these requests will decrease the amount of time that you wait before being seen by our physicians.       _____________________________________________________________  Should you have questions after your visit to [facility], please contact our office at [phone] between the hours of 8:00 a.m. and 4:30 p.m.  Voicemails left after 4:00 p.m. will not be returned until the following business day.  For prescription refill requests, have your pharmacy contact our office and allow 72 hours.    Due to Covid, you will need to wear a mask upon entering the hospital. If you do not have a mask, a mask will be given to you at the Main Entrance upon arrival. For doctor visits, patients may have 1 support person with them. For treatment visits, patients can not have anyone with them due to social distancing guidelines and our immunocompromised population.     

## 2019-11-25 ENCOUNTER — Encounter (HOSPITAL_COMMUNITY): Payer: Self-pay

## 2019-11-25 ENCOUNTER — Encounter (HOSPITAL_COMMUNITY): Payer: Medicaid Other

## 2019-11-25 NOTE — Progress Notes (Signed)
Nutrition  Called patient for nutrition follow-up.  No answer or option to leave voicemail.  Nishika Parkhurst B. Zenia Resides, [place], [place] Registered Dietitian [phone] (pager)

## 2019-12-09 ENCOUNTER — Encounter: Payer: Self-pay | Admitting: General Practice

## 2019-12-09 ENCOUNTER — Other Ambulatory Visit: Payer: Self-pay

## 2019-12-09 ENCOUNTER — Ambulatory Visit (HOSPITAL_COMMUNITY)
Admission: RE | Admit: 2019-12-09 | Discharge: 2019-12-09 | Disposition: A | Discharge status: Home / Self Care | Payer: Medicaid Other | Source: Ambulatory Visit | Attending: Hematology | Admitting: Hematology

## 2019-12-09 DIAGNOSIS — C3411 Malignant neoplasm of upper lobe, right bronchus or lung: Secondary | ICD-10-CM | POA: Diagnosis present

## 2019-12-09 DIAGNOSIS — C7931 Secondary malignant neoplasm of brain: Secondary | ICD-10-CM

## 2019-12-09 MED ORDER — IOHEXOL 300 MG/ML  SOLN
100.0000 mL | Freq: Once | INTRAMUSCULAR | Status: AC | PRN
  Administered 2019-12-09: 100 mL via INTRAVENOUS

## 2019-12-09 NOTE — Progress Notes (Signed)
[facility] CSW Progress Notes  Email from [employer] - they have awarded patient a one time $77 Emergency Assistance grant and will mail check to patient's home address.  Edwyna Shell, LCSW Clinical Social Worker Phone:  [phone]

## 2019-12-14 ENCOUNTER — Other Ambulatory Visit: Payer: Self-pay

## 2019-12-14 ENCOUNTER — Inpatient Hospital Stay (HOSPITAL_COMMUNITY): Payer: Medicaid Other | Attending: Hematology | Admitting: Hematology

## 2019-12-14 ENCOUNTER — Inpatient Hospital Stay (HOSPITAL_COMMUNITY): Payer: Medicaid Other

## 2019-12-14 ENCOUNTER — Inpatient Hospital Stay (HOSPITAL_COMMUNITY): Payer: Medicaid Other | Attending: Hematology

## 2019-12-14 ENCOUNTER — Other Ambulatory Visit (HOSPITAL_COMMUNITY): Payer: Self-pay

## 2019-12-14 VITALS — BP 106/62 | HR 68 | Temp 97.8°F | Resp 18

## 2019-12-14 VITALS — BP 121/70 | HR 75 | Temp 97.3°F | Resp 18 | Wt 135.7 lb

## 2019-12-14 DIAGNOSIS — C7931 Secondary malignant neoplasm of brain: Secondary | ICD-10-CM | POA: Insufficient documentation

## 2019-12-14 DIAGNOSIS — Z5112 Encounter for antineoplastic immunotherapy: Secondary | ICD-10-CM | POA: Diagnosis present

## 2019-12-14 DIAGNOSIS — Z79899 Other long term (current) drug therapy: Secondary | ICD-10-CM | POA: Insufficient documentation

## 2019-12-14 DIAGNOSIS — C7951 Secondary malignant neoplasm of bone: Secondary | ICD-10-CM | POA: Diagnosis not present

## 2019-12-14 DIAGNOSIS — M1731 Unilateral post-traumatic osteoarthritis, right knee: Secondary | ICD-10-CM | POA: Diagnosis not present

## 2019-12-14 DIAGNOSIS — Z5111 Encounter for antineoplastic chemotherapy: Secondary | ICD-10-CM | POA: Diagnosis present

## 2019-12-14 DIAGNOSIS — C3490 Malignant neoplasm of unspecified part of unspecified bronchus or lung: Secondary | ICD-10-CM | POA: Diagnosis not present

## 2019-12-14 DIAGNOSIS — Z95828 Presence of other vascular implants and grafts: Secondary | ICD-10-CM

## 2019-12-14 DIAGNOSIS — C3411 Malignant neoplasm of upper lobe, right bronchus or lung: Secondary | ICD-10-CM | POA: Diagnosis not present

## 2019-12-14 DIAGNOSIS — D649 Anemia, unspecified: Secondary | ICD-10-CM | POA: Diagnosis not present

## 2019-12-14 LAB — COMPREHENSIVE METABOLIC PANEL
ALT: 9 U/L (ref 0–44)
AST: 18 U/L (ref 15–41)
Albumin: 3.5 g/dL (ref 3.5–5.0)
Alkaline Phosphatase: 155 U/L — ABNORMAL HIGH (ref 38–126)
Anion gap: 11 (ref 5–15)
BUN: 5 mg/dL — ABNORMAL LOW (ref 6–20)
CO2: 25 mmol/L (ref 22–32)
Calcium: 9.8 mg/dL (ref 8.9–10.3)
Chloride: 101 mmol/L (ref 98–111)
Creatinine, Ser: 0.58 mg/dL — ABNORMAL LOW (ref 0.61–1.24)
GFR calc Af Amer: >60 mL/min (ref >60)
GFR calc non Af Amer: >60 mL/min (ref >60)
Glucose, Bld: 127 mg/dL — ABNORMAL HIGH (ref 70–99)
Potassium: 3.1 mmol/L — ABNORMAL LOW (ref 3.5–5.1)
Sodium: 137 mmol/L (ref 135–145)
Total Bilirubin: 0.4 mg/dL (ref 0.3–1.2)
Total Protein: 8.3 g/dL — ABNORMAL HIGH (ref 6.5–8.1)

## 2019-12-14 LAB — CBC WITH DIFFERENTIAL/PLATELET
Abs Immature Granulocytes: 0.02 10*3/uL (ref 0.00–0.07)
Basophils Absolute: 0 10*3/uL (ref 0.0–0.1)
Basophils Relative: 0 %
Eosinophils Absolute: 0.1 10*3/uL (ref 0.0–0.5)
Eosinophils Relative: 1 %
HCT: 27.5 % — ABNORMAL LOW (ref 39.0–52.0)
Hemoglobin: 8.4 g/dL — ABNORMAL LOW (ref 13.0–17.0)
Immature Granulocytes: 0 %
Lymphocytes Relative: 22 %
Lymphs Abs: 1.1 10*3/uL (ref 0.7–4.0)
MCH: 30.4 pg (ref 26.0–34.0)
MCHC: 30.5 g/dL (ref 30.0–36.0)
MCV: 99.6 fL (ref 80.0–100.0)
Monocytes Absolute: 0.5 10*3/uL (ref 0.1–1.0)
Monocytes Relative: 11 %
Neutro Abs: 3.2 10*3/uL (ref 1.7–7.7)
Neutrophils Relative %: 66 %
Platelets: 320 10*3/uL (ref 150–400)
RBC: 2.76 MIL/uL — ABNORMAL LOW (ref 4.22–5.81)
RDW: 14 % (ref 11.5–15.5)
WBC: 4.9 10*3/uL (ref 4.0–10.5)
nRBC: 0 % (ref 0.0–0.2)

## 2019-12-14 LAB — TSH: TSH: 2.644 u[IU]/mL (ref 0.350–4.500)

## 2019-12-14 MED ORDER — SODIUM CHLORIDE 0.9 % IV SOLN
200.0000 mg | Freq: Once | INTRAVENOUS | Status: AC
  Administered 2019-12-14: 200 mg via INTRAVENOUS
  Filled 2019-12-14: qty 8

## 2019-12-14 MED ORDER — SODIUM CHLORIDE 0.9 % IV SOLN
500.0000 mg/m2 | Freq: Once | INTRAVENOUS | Status: AC
  Administered 2019-12-14: 900 mg via INTRAVENOUS
  Filled 2019-12-14: qty 16

## 2019-12-14 MED ORDER — CYANOCOBALAMIN 1000 MCG/ML IJ SOLN: 1000.0000 ug | Freq: Once | INTRAMUSCULAR | Status: DC

## 2019-12-14 MED ORDER — SODIUM CHLORIDE 0.9 % IV SOLN: Freq: Once | INTRAVENOUS | Status: AC

## 2019-12-14 MED ORDER — SODIUM CHLORIDE 0.9% FLUSH
10.0000 mL | INTRAVENOUS | Status: DC | PRN
  Administered 2019-12-14: 10 mL

## 2019-12-14 MED ORDER — PROCHLORPERAZINE MALEATE 10 MG PO TABS
10.0000 mg | ORAL_TABLET | Freq: Once | ORAL | Status: AC
  Administered 2019-12-14: 10 mg via ORAL
  Filled 2019-12-14: qty 1

## 2019-12-14 MED ORDER — HEPARIN SOD (PORK) LOCK FLUSH 100 UNIT/ML IV SOLN
500.0000 [IU] | Freq: Once | INTRAVENOUS | Status: AC | PRN
  Administered 2019-12-14: 500 [IU]

## 2019-12-14 NOTE — Patient Instructions (Signed)
[place] at [facility] Discharge Instructions  You were seen today by [name]. He went over your recent results and scans. You will receive treatment today. Start drinking 1 can of Boost/Ensure daily to increase your weight. [name] will see you back in 3 weeks for labs and follow up.   Thank you for choosing [place] at [facility] to provide your oncology and hematology care.  To afford each patient quality time with our provider, please arrive at least 15 minutes before your scheduled appointment time.   If you have a lab appointment with the [facility] please come in thru the Main Entrance and check in at the main information desk  You need to re-schedule your appointment should you arrive 10 or more minutes late.  We strive to give you quality time with our providers, and arriving late affects you and other patients whose appointments are after yours.  Also, if you no show three or more times for appointments you may be dismissed from the clinic at the providers discretion.     Again, thank you for choosing [facility].  Our hope is that these requests will decrease the amount of time that you wait before being seen by our physicians.       _____________________________________________________________  Should you have questions after your visit to [facility], please contact our office at [phone] between the hours of 8:00 a.m. and 4:30 p.m.  Voicemails left after 4:00 p.m. will not be returned until the following business day.  For prescription refill requests, have your pharmacy contact our office and allow 72 hours.    Cancer Center Support Programs:   > Cancer Support Group  2nd Tuesday of the month 1pm-2pm, Journey Room

## 2019-12-14 NOTE — Progress Notes (Signed)
0850 Labs reviewed with and pt seen by [name] and pt approved for Alimta and Keytruda infusions today per MD             [name] tolerated Keytruda and Alimta infusions well without complaints or incident. VSS upon discharge. Pt discharged self ambulatory using his cane in satisfactory condition

## 2019-12-14 NOTE — Patient Instructions (Signed)
[facility] at [facility] Discharge Instructions  Labs drawn from portacath today   Thank you for choosing [facility] to provide your oncology and hematology care.  To afford each patient quality time with our provider, please arrive at least 15 minutes before your scheduled appointment time.   If you have a lab appointment with the Cancer Center please come in thru the Main Entrance and check in at the main information desk.  You need to re-schedule your appointment should you arrive 10 or more minutes late.  We strive to give you quality time with our providers, and arriving late affects you and other patients whose appointments are after yours.  Also, if you no show three or more times for appointments you may be dismissed from the clinic at the providers discretion.     Again, thank you for choosing [facility].  Our hope is that these requests will decrease the amount of time that you wait before being seen by our physicians.       _____________________________________________________________  Should you have questions after your visit to [facility], please contact our office at [phone] between the hours of 8:00 a.m. and 4:30 p.m.  Voicemails left after 4:00 p.m. will not be returned until the following business day.  For prescription refill requests, have your pharmacy contact our office and allow 72 hours.    Due to Covid, you will need to wear a mask upon entering the hospital. If you do not have a mask, a mask will be given to you at the Main Entrance upon arrival. For doctor visits, patients may have 1 support person with them. For treatment visits, patients can not have anyone with them due to social distancing guidelines and our immunocompromised population.     

## 2019-12-14 NOTE — Progress Notes (Signed)
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  PRIOR THERAPY: Carboplatin, Keytruda and Alimta x 4 cycles from 07/05/2019 through 09/20/2019  NGS Results: PD-L1 TPS 20%  CURRENT THERAPY: Maintenance Keytruda & Alimta  BRIEF ONCOLOGIC HISTORY:  Oncology History  Malignant neoplasm of right upper lobe of lung ([place])  05/02/2019 Initial Diagnosis   Malignant neoplasm of upper lobe of right lung ([place])   05/19/2019 Cancer Staging   Staging form: Lung, AJCC 8th Edition - Clinical stage from 05/19/2019: Stage IVB (cT2b, cN1, pM1c) - Signed by Derek Jack, MD on 05/19/2019   07/05/2019 -  Chemotherapy   The patient had palonosetron (ALOXI) injection 0.25 mg, 0.25 mg, Intravenous,  Once, 4 of 4 cycles Administration: 0.25 mg (07/05/2019), 0.25 mg (08/16/2019), 0.25 mg (09/20/2019), 0.25 mg (07/26/2019) PEMEtrexed (ALIMTA) 900 mg in sodium chloride 0.9 % 100 mL chemo infusion, 500 mg/m2 = 900 mg, Intravenous,  Once, 7 of 9 cycles Dose modification: 400 mg/m2 (80 % of original dose 500 mg/m2, Cycle 4, Reason: Provider Judgment), 500 mg/m2 (100 % of original dose 500 mg/m2, Cycle 5, Reason: Provider Judgment) Administration: 900 mg (07/05/2019), 900 mg (08/16/2019), 700 mg (09/20/2019), 900 mg (10/11/2019), 900 mg (11/01/2019), 900 mg (07/26/2019), 900 mg (11/22/2019) CARBOplatin (PARAPLATIN) 580 mg in sodium chloride 0.9 % 250 mL chemo infusion, 580 mg (100 % of original dose 581.5 mg), Intravenous,  Once, 4 of 4 cycles Dose modification:   (original dose 581.5 mg, Cycle 1),   (original dose 581.5 mg, Cycle 3),   (original dose 465.2 mg, Cycle 4, Reason: Provider Judgment),   (original dose 581.5 mg, Cycle 2) Administration: 580 mg (07/05/2019), 580 mg (08/16/2019), 470 mg (09/20/2019), 580 mg (07/26/2019) fosaprepitant (EMEND) 150  mg in sodium chloride 0.9 % 145 mL IVPB, 150 mg, Intravenous,  Once, 4 of 4 cycles Administration: 150 mg (07/05/2019), 150 mg (08/16/2019), 150 mg (09/20/2019), 150 mg (07/26/2019) pembrolizumab (KEYTRUDA) 200 mg in sodium chloride 0.9 % 50 mL chemo infusion, 200 mg, Intravenous, Once, 7 of 9 cycles Administration: 200 mg (07/05/2019), 200 mg (08/16/2019), 200 mg (09/20/2019), 200 mg (10/11/2019), 200 mg (11/01/2019), 200 mg (07/26/2019), 200 mg (11/22/2019)  for chemotherapy treatment.      CANCER STAGING: Cancer Staging Malignant neoplasm of right upper lobe of lung [facility]) Staging form: Lung, AJCC 8th Edition - Clinical stage from 05/19/2019: Stage IVB (cT2b, cN1, pM1c) - Signed by Derek Jack, MD on 05/19/2019   INTERVAL HISTORY:  Brandon Ferguson, a 59 y.o. male, returns for routine follow-up and consideration for next cycle of chemotherapy. Rachit was last seen on 11/22/2019.  Due for cycle #8 of Keytruda & Alimta today.   Today he is accompanied by his wife. He still has lower back pain which is controlled with tramadol or Tylenol. He denies having diarrhea, SOB. He reports having a good appetite and denies vomiting. Overall, he tells me he has been feeling pretty well.  He has a cane just in case, but does not use it constantly. He reports that he watches TV at home, and is able to do his daily activities.  Overall, he feels ready for next cycle of chemo today.    REVIEW OF SYSTEMS:  Review of Systems  Constitutional: Positive for appetite change (mildly decreased). Negative for fatigue.  Respiratory: Negative for  shortness of breath.   Gastrointestinal: Negative for diarrhea.  Musculoskeletal: Positive for back pain.  Skin: Positive for itching (left forearm).  All other systems reviewed and are negative.   PAST MEDICAL/SURGICAL HISTORY:  Past Medical History:  Diagnosis Date  . Cancer ([place])   . GERD (gastroesophageal reflux disease)   . Iron deficiency   .  Port-A-Cath in place 06/17/2019  . Vitamin D deficiency    Past Surgical History:  Procedure Laterality Date  . KNEE SURGERY    . PORTACATH PLACEMENT Left 06/29/2019   Procedure: INSERTION PORT-A-CATH;  Surgeon: Aviva Signs, MD;  Location: [facility];  Service: General;  Laterality: Left;  [name] Kitchen VIDEO BRONCHOSCOPY Bilateral 04/29/2019   Procedure: VIDEO BRONCHOSCOPY WITH FLUORO;  Surgeon: Laurin Coder, MD;  Location: [facility];  Service: Endoscopy;  Laterality: Bilateral;  . VIDEO BRONCHOSCOPY Bilateral 05/03/2019   Procedure: VIDEO BRONCHOSCOPY WITHOUT FLUORO;  Surgeon: Rigoberto Noel, MD;  Location: [facility];  Service: Cardiopulmonary;  Laterality: Bilateral;    SOCIAL HISTORY:  Social History   Socioeconomic History  . Marital status: Single    Spouse name: Not on file  . Number of children: Not on file  . Years of education: Not on file  . Highest education level: Not on file  Occupational History  . Not on file  Tobacco Use  . Smoking status: Current Every Day Smoker    Packs/day: 1.00    Types: Cigarettes  . Smokeless tobacco: Never Used  . Tobacco comment: quit cigs 5 months ago  Vaping Use  . Vaping Use: Never used  Substance and Sexual Activity  . Alcohol use: Yes    Alcohol/week: 6.0 standard drinks    Types: 6 Cans of beer per week    Comment: pt drinks 3 large beers daily. hasnt had any in 5 months  . Drug use: Yes    Types: Marijuana    Comment: daily. as of Jan 18,2021 hasnt had any in 5 months  . Sexual activity: Not on file  Other Topics Concern  . Not on file  Social History Narrative  . Not on file   Social Determinants of Health   Financial Resource Strain: Low Risk   . Difficulty of Paying Living Expenses: Not very hard  Food Insecurity: No Food Insecurity  . Worried About Charity fundraiser in the Last Year: Never true  . Ran Out of Food in the Last Year: Never true  Transportation Needs: No Transportation Needs  . Lack of  Transportation (Medical): No  . Lack of Transportation (Non-Medical): No  Physical Activity: Inactive  . Days of Exercise per Week: 0 days  . Minutes of Exercise per Session: 0 min  Stress: No Stress Concern Present  . Feeling of Stress : Only a little  Social Connections: Moderately Isolated  . Frequency of Communication with Friends and Family: More than three times a week  . Frequency of Social Gatherings with Friends and Family: More than three times a week  . Attends Religious Services: 1 to 4 times per year  . Active Member of Clubs or Organizations: No  . Attends Archivist Meetings: Never  . Marital Status: Never married  Intimate Partner Violence: Not At Risk  . Fear of Current or Ex-Partner: No  . Emotionally Abused: No  . Physically Abused: No  . Sexually Abused: No    FAMILY HISTORY:  Family History  Problem Relation Age of Onset  . Breast cancer Mother   .  Cirrhosis Father   . Alcohol abuse Father   . Hypertension Sister   . Cancer Brother   . Hypertension Sister   . Hypertension Sister   . Hypertension Sister   . Hypertension Sister     CURRENT MEDICATIONS:  Current Outpatient Medications  Medication Sig Dispense Refill  . CARBOPLATIN IV Inject into the vein every 21 ( twenty-one) days. Every 3 weeks x 4 cycles    . ferrous sulfate 325 (65 FE) MG EC tablet Take 325 mg by mouth daily.    . folic acid (FOLVITE) 1 MG tablet Take 1 tablet (1 mg total) by mouth daily. 30 tablet 3  . levETIRAcetam (KEPPRA) 500 MG tablet Take 1 tablet (500 mg total) by mouth 2 (two) times daily. 60 tablet 3  . magnesium oxide (MAG-OX) 400 (241.3 Mg) MG tablet Take 1 tablet (400 mg total) by mouth 2 (two) times daily. 60 tablet 3  . pantoprazole (PROTONIX) 40 MG tablet Take 1 tablet by mouth once daily 30 tablet 0  . pantoprazole (PROTONIX) 40 MG tablet Take 1 tablet (40 mg total) by mouth daily. 30 tablet 3  . PEMBROLIZUMAB IV Inject into the vein every 21 ( twenty-one)  days.    [name] Kitchen PEMEtrexed 500 mg/m2 in sodium chloride 0.9 % 100 mL Inject 500 mg/m2 into the vein every 21 ( twenty-one) days. Every 3 weeks x 4 cycles    . traMADol (ULTRAM) 50 MG tablet TAKE 1 TABLET BY MOUTH AT BEDTIME AS NEEDED 30 tablet 0  . acetaminophen (TYLENOL) 500 MG tablet Take 1,000 mg by mouth every 6 (six) hours as needed for moderate pain or headache.  (Patient not taking: Reported on 12/14/2019)    . lidocaine-prilocaine (EMLA) cream Apply a small amount to port a cath site and cover with plastic wrap 1 hour prior to chemotherapy appointments (Patient not taking: Reported on 12/14/2019) 30 g 3  . polyethylene glycol (MIRALAX / GLYCOLAX) 17 g packet Take 17 g by mouth daily as needed. (Patient not taking: Reported on 12/14/2019) 14 each 0  . prochlorperazine (COMPAZINE) 10 MG tablet Take 1 tablet (10 mg total) by mouth every 6 (six) hours as needed (Nausea or vomiting). (Patient not taking: Reported on 12/14/2019) 30 tablet 1   No current facility-administered medications for this visit.    ALLERGIES:  No Known Allergies  PHYSICAL EXAM:  Performance status (ECOG): 1 - Symptomatic but completely ambulatory  Vitals:   12/14/19 0756  BP: 121/70  Pulse: 75  Resp: 18  Temp: (!) 97.3 F (36.3 C)  SpO2: 100%   Wt Readings from Last 3 Encounters:  12/14/19 61.6 kg (135 lb 11.2 oz)  11/22/19 62.1 kg (136 lb 12.8 oz)  11/14/19 62.9 kg (138 lb 11.2 oz)   Physical Exam Vitals reviewed.  Constitutional:      Appearance: Normal appearance.  Cardiovascular:     Rate and Rhythm: Normal rate and regular rhythm.     Pulses: Normal pulses.     Heart sounds: Normal heart sounds.  Pulmonary:     Effort: Pulmonary effort is normal.     Breath sounds: Normal breath sounds.  Musculoskeletal:     Right lower leg: No edema.     Left lower leg: No edema.  Skin:    Findings: Rash (wheals on medial side of L forearm) present.  Neurological:     General: No focal deficit present.     Mental  Status: He is alert and oriented to person,  place, and time.  Psychiatric:        Mood and Affect: Mood normal.        Behavior: Behavior normal.     LABORATORY DATA:  I have reviewed the labs as listed.  CBC Latest Ref Rng & Units 12/14/2019 11/22/2019 11/01/2019  WBC 4.0 - 10.5 K/uL 4.9 4.8 4.1  Hemoglobin 13.0 - 17.0 g/dL 8.4(L) 8.5(L) 8.7(L)  Hematocrit 39 - 52 % 27.5(L) 27.6(L) 27.5(L)  Platelets 150 - 400 K/uL 320 324 295   CMP Latest Ref Rng & Units 12/14/2019 11/22/2019 11/01/2019  Glucose 70 - 99 mg/dL 127(H) 100(H) 156(H)  BUN 6 - 20 mg/dL 5(L) 9 12  Creatinine 0.61 - 1.24 mg/dL 0.58(L) 0.47(L) 0.50(L)  Sodium 135 - 145 mmol/L 137 138 136  Potassium 3.5 - 5.1 mmol/L 3.1(L) 3.3(L) 3.4(L)  Chloride 98 - 111 mmol/L 101 102 98  CO2 22 - 32 mmol/L _0 Calcium 8.9 - 10.3 mg/dL 9.8 9.8 9.9  Total Protein 6.5 - 8.1 g/dL 8.3(H) 8.2(H) 8.1  Total Bilirubin 0.3 - 1.2 mg/dL 0.4 0.4 0.5  Alkaline Phos 38 - 126 U/L 155(H) 150(H) 147(H)  AST 15 - 41 U/L _1 ALT 0 - 44 U/L _2 DIAGNOSTIC IMAGING:  I have independently reviewed the scans and discussed with the patient. CT Chest W Contrast  Result Date: 12/13/2019 CLINICAL DATA:  Right upper lobe metastatic lung cancer restaging EXAM: CT CHEST, ABDOMEN, AND PELVIS WITH CONTRAST TECHNIQUE: Multidetector CT imaging of the chest, abdomen and pelvis was performed following the standard protocol during bolus administration of intravenous contrast. CONTRAST:  141m OMNIPAQUE IOHEXOL 300 MG/ML  SOLN COMPARISON:  09/07/2019 FINDINGS: CT CHEST FINDINGS Cardiovascular: Left Port-A-Cath tip: SVC. Atherosclerotic calcification of the aortic arch and the left anterior descending coronary artery. Mediastinum/Nodes: Circumferential distal esophageal wall thickening, the most common cause would be esophagitis. No individually enlarged thoracic lymph nodes are identified. Lungs/Pleura: The region of solid masslike consolidation posteriorly in the  right upper lobe measures 3.6 by 2.4 cm on image 69/3, formerly 5.0 by 3.8 cm by my measurement, substantially reduced. Bandlike scarring or atelectasis anteriorly in the right upper lobe appears mildly increased. Patchy opacities in the superior segment right lower lobe and to a lesser degree in the right upper lobe are increased with associated airway thickening, some of this appearance could be from radiation pneumonitis given proximity to the tumor. A component of lymphangitic spread of tumor is not completely excluded. Emphysema noted with apical bulla bilaterally. 3 mm triangular left lower lobe pleural-based nodule on image 155/3, stable. Musculoskeletal: Scattered osseous metastatic lesions demonstrate increased sclerosis compared to the 09/07/2019 exam, but similar distribution. Mild increase in sclerotic periostitis associated with the left tenth rib lesion, with mild surrounding soft tissue prominence between the ribs and the pleural surface. CT ABDOMEN PELVIS FINDINGS Hepatobiliary: Unremarkable Pancreas: Unremarkable Spleen: Unremarkable Adrenals/Urinary Tract: Suspected 4 mm in long axis left kidney lower pole renal calculus, image 79/2. There is fullness of the left adrenal gland without a well-defined mass. Stomach/Bowel: Unremarkable Vascular/Lymphatic: Aortoiliac atherosclerotic vascular disease. Reproductive: Considerable prostatomegaly. Other: No supplemental non-categorized findings. Musculoskeletal: Enlarging destructive/lytic right iliac crest lesion with extraosseous extension and expanding soft tissue component measuring 5.6 by 3.6 cm. This tumor was previously primarily intraosseous and measured about 2.9 by 1.3 cm on 09/07/2019. As in the chest, there is some increased sclerosis of the previously existing lesions, including along the margins of the  large left sacral lesion. There is better definition of a pathologic fracture through the anterior margin of the left sacral lesion, for  example on image 99/2. Increase conspicuity of pathologic fracture of the right superior pubic ramus laterally similar appearance of extensive compression fracture at L4 which appears to be pathologic and which is associated with significant central narrowing of the thecal sac at the L4-5 level. Epidural tumor at this level is not readily excluded IMPRESSION: 1. Overall mixed appearance, with significant reduction in size of the region of solid masslike consolidation posteriorly in the right upper lobe corresponding to the original tumor; but with clear increase in bony lytic destruction in soft tissue mass appearance of the lytic right iliac crest lesion. Most of the other bony lesions appear similar in size and distribution compared to the prior exam, although with increased sclerosis. This increased sclerosis is otherwise nonspecific. Pathologic fractures in the left sacrum and right superior pubic ramus are again observed although have mildly increased conspicuity due to the increased sclerosis in the associated pathologic bony lesions. 2. Patchy opacities in the superior segment right lower lobe and to a lesser degree in the right upper lobe are increased with associated airway thickening, some of this appearance could be from radiation pneumonitis given proximity to the tumor. A component of lymphangitic spread of tumor is not completely excluded. 3. Other imaging findings of potential clinical significance: Coronary atherosclerosis. Circumferential distal esophageal wall thickening, the most common cause would be esophagitis. Considerable prostatomegaly. Suspected 4 mm in long axis left kidney lower pole renal calculus. 4. Emphysema and aortic atherosclerosis. Aortic Atherosclerosis (ICD10-I70.0) and Emphysema (ICD10-J43.9). Electronically Signed   By: Van Clines M.D.   On: 12/13/2019 06:12   CT Abdomen Pelvis W Contrast  Result Date: 12/13/2019 CLINICAL DATA:  Right upper lobe metastatic lung  cancer restaging EXAM: CT CHEST, ABDOMEN, AND PELVIS WITH CONTRAST TECHNIQUE: Multidetector CT imaging of the chest, abdomen and pelvis was performed following the standard protocol during bolus administration of intravenous contrast. CONTRAST:  138m OMNIPAQUE IOHEXOL 300 MG/ML  SOLN COMPARISON:  09/07/2019 FINDINGS: CT CHEST FINDINGS Cardiovascular: Left Port-A-Cath tip: SVC. Atherosclerotic calcification of the aortic arch and the left anterior descending coronary artery. Mediastinum/Nodes: Circumferential distal esophageal wall thickening, the most common cause would be esophagitis. No individually enlarged thoracic lymph nodes are identified. Lungs/Pleura: The region of solid masslike consolidation posteriorly in the right upper lobe measures 3.6 by 2.4 cm on image 69/3, formerly 5.0 by 3.8 cm by my measurement, substantially reduced. Bandlike scarring or atelectasis anteriorly in the right upper lobe appears mildly increased. Patchy opacities in the superior segment right lower lobe and to a lesser degree in the right upper lobe are increased with associated airway thickening, some of this appearance could be from radiation pneumonitis given proximity to the tumor. A component of lymphangitic spread of tumor is not completely excluded. Emphysema noted with apical bulla bilaterally. 3 mm triangular left lower lobe pleural-based nodule on image 155/3, stable. Musculoskeletal: Scattered osseous metastatic lesions demonstrate increased sclerosis compared to the 09/07/2019 exam, but similar distribution. Mild increase in sclerotic periostitis associated with the left tenth rib lesion, with mild surrounding soft tissue prominence between the ribs and the pleural surface. CT ABDOMEN PELVIS FINDINGS Hepatobiliary: Unremarkable Pancreas: Unremarkable Spleen: Unremarkable Adrenals/Urinary Tract: Suspected 4 mm in long axis left kidney lower pole renal calculus, image 79/2. There is fullness of the left adrenal gland  without a well-defined mass. Stomach/Bowel: Unremarkable Vascular/Lymphatic: Aortoiliac atherosclerotic vascular disease.  Reproductive: Considerable prostatomegaly. Other: No supplemental non-categorized findings. Musculoskeletal: Enlarging destructive/lytic right iliac crest lesion with extraosseous extension and expanding soft tissue component measuring 5.6 by 3.6 cm. This tumor was previously primarily intraosseous and measured about 2.9 by 1.3 cm on 09/07/2019. As in the chest, there is some increased sclerosis of the previously existing lesions, including along the margins of the large left sacral lesion. There is better definition of a pathologic fracture through the anterior margin of the left sacral lesion, for example on image 99/2. Increase conspicuity of pathologic fracture of the right superior pubic ramus laterally similar appearance of extensive compression fracture at L4 which appears to be pathologic and which is associated with significant central narrowing of the thecal sac at the L4-5 level. Epidural tumor at this level is not readily excluded IMPRESSION: 1. Overall mixed appearance, with significant reduction in size of the region of solid masslike consolidation posteriorly in the right upper lobe corresponding to the original tumor; but with clear increase in bony lytic destruction in soft tissue mass appearance of the lytic right iliac crest lesion. Most of the other bony lesions appear similar in size and distribution compared to the prior exam, although with increased sclerosis. This increased sclerosis is otherwise nonspecific. Pathologic fractures in the left sacrum and right superior pubic ramus are again observed although have mildly increased conspicuity due to the increased sclerosis in the associated pathologic bony lesions. 2. Patchy opacities in the superior segment right lower lobe and to a lesser degree in the right upper lobe are increased with associated airway thickening, some  of this appearance could be from radiation pneumonitis given proximity to the tumor. A component of lymphangitic spread of tumor is not completely excluded. 3. Other imaging findings of potential clinical significance: Coronary atherosclerosis. Circumferential distal esophageal wall thickening, the most common cause would be esophagitis. Considerable prostatomegaly. Suspected 4 mm in long axis left kidney lower pole renal calculus. 4. Emphysema and aortic atherosclerosis. Aortic Atherosclerosis (ICD10-I70.0) and Emphysema (ICD10-J43.9). Electronically Signed   By: Van Clines M.D.   On: 12/13/2019 06:12     ASSESSMENT:  1. 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 -4 cycles of carboplatin, pemetrexed and pembrolizumab from 07/05/2019 through 09/20/2019. -CT scan on 09/07/2019 showed improved lung lesion. L4 compression fracture from previous metastatic disease. -Bone scan was stable. -Maintenance pemetrexed and pembrolizumab started on 10/11/2019. -CT CAP on 12/09/2019 showed mixed response with significant reduction in size of the right upper lobe mass.  However right iliac crest lesion has increased in size with soft tissue mass and lytic destruction.  Most of further bone lesions appear similar in size and distribution.  2. Brain metastasis: -Presentation with right arm weakness with brain MRI on 04/28/2019 showing widespread metastatic disease in the brain, approximately 22 lesions. -WBRT completed on 05/19/2019. -MRI on 08/11/2019 showed interval decrease in size of multiple enhancing lesions scattered throughout the brain parenchyma. Decrease in vasogenic edema. No new lesions. -MRI of the brain on 11/12/2019 showed 24 mm enhancing transfalcine lesion has increased in size compared to MRI from 08/11/2019, previously 15 mm.  Other brain lesions have remained stable or decreased in size.  Edema also improved.  3.  Back pain: -Completed XRT to the bone metastasis on 06/14/2019.   PLAN:  1. Metastatic lung adenocarcinoma: -I have discussed the images and CT report with the patient and his sister in detail. -He had received XRT to  the right pelvis from 05/30/2019 through 06/14/2019, 30 Gray in 10 fractions under the direction of Dr. Lisbeth Renshaw. -I do not know whether the new area of metastasis is covered in that field and if he can further receive any treatment to this area.  I will reach out to our radiation oncology colleagues. -As there is improvement in the lung lesion and no other worsening of his cancer other than his right pelvis, we can consider continuing same therapy if he is able to get radiation.  He will proceed with his treatment today and we will reevaluate him in 3 weeks.  2. Brain metastasis: -Continue Keppra 500 mg twice daily.  MRI of the brain will be repeated in 3 months from 11/12/2019.  3. Back pain: -He is not requiring tramadol.  4. Macrocytic anemia: -Hemoglobin today is 8.5 with MCV of 99.6. -We will plan to repeat iron panel and V78 and folic acid.   Orders placed this encounter:  No orders of the defined types were placed in this encounter.    Derek Jack, MD [place] [phone]   I, Milinda Antis, am acting as a scribe for Dr. Sanda Linger.  I, Derek Jack MD, have reviewed the above documentation for accuracy and completeness, and I agree with the above.

## 2019-12-14 NOTE — Progress Notes (Signed)
Patient has been assessed, vital signs and labs have been reviewed by Dr. Katragadda. ANC, Creatinine, LFTs, and Platelets are within treatment parameters per Dr. Katragadda. The patient is good to proceed with treatment at this time.  

## 2019-12-14 NOTE — Patient Instructions (Signed)
[facility] Discharge Instructions for Patients Receiving Chemotherapy   Beginning January 23rd 2017 lab work for the [facility] will be done in the  Main lab at [facility] on 1st floor. If you have a lab appointment with the [facility] please come in thru the  Main Entrance and check in at the main information desk   Today you received the following chemotherapy agents Keytruda and Alimta. Follow-up as scheduled  To help prevent nausea and vomiting after your treatment, we encourage you to take your nausea medication   If you develop nausea and vomiting, or diarrhea that is not controlled by your medication, call the clinic.  The clinic phone number is [phone]. Office hours are Monday-Friday 8:30am-5:00pm.  BELOW ARE SYMPTOMS THAT SHOULD BE REPORTED IMMEDIATELY:  *FEVER GREATER THAN 101.0 F  *CHILLS WITH OR WITHOUT FEVER  NAUSEA AND VOMITING THAT IS NOT CONTROLLED WITH YOUR NAUSEA MEDICATION  *UNUSUAL SHORTNESS OF BREATH  *UNUSUAL BRUISING OR BLEEDING  TENDERNESS IN MOUTH AND THROAT WITH OR WITHOUT PRESENCE OF ULCERS  *URINARY PROBLEMS  *BOWEL PROBLEMS  UNUSUAL RASH Items with * indicate a potential emergency and should be followed up as soon as possible. If you have an emergency after office hours please contact your primary care physician or go to the nearest emergency department.  Please call the clinic during office hours if you have any questions or concerns.   You may also contact the Patient Navigator at [phone] should you have any questions or need assistance in obtaining follow up care.      Resources For Cancer Patients and their Caregivers ? American Cancer Society: Can assist with transportation, wigs, general needs, runs Look Good Feel Better.        757-645-5138 ? Cancer Care: Provides financial assistance, online support groups, medication/co-pay assistance.  1-800-813-HOPE (206)032-6873) ? [facility] Assists [place] cancer patients and their families through emotional , educational and financial support.  402-300-8495 ? [place] DSS Where to apply for food stamps, Medicaid and utility assistance. (630)450-0510 ? [facility]: Transportation to medical appointments. (318)109-8008 ? Social Security Administration: May apply for disability if have a Stage IV cancer. 620-879-0147 518-806-7823 ? [employer], Disability and Transit Services: Assists with nutrition, care and transit needs. 610-297-0835

## 2019-12-23 ENCOUNTER — Other Ambulatory Visit: Payer: Self-pay

## 2019-12-23 ENCOUNTER — Inpatient Hospital Stay (HOSPITAL_COMMUNITY): Payer: Medicaid Other

## 2019-12-23 ENCOUNTER — Telehealth (HOSPITAL_COMMUNITY): Payer: Self-pay

## 2019-12-23 NOTE — Telephone Encounter (Signed)
Nutrition  Called patient for nutrition follow-up several times during the day.  No answer and voice mailbox has not been set up so unable to leave message.   Brandon Ferguson B. Zenia Resides, [place], [place] Registered Dietitian [phone] (pager)

## 2019-12-27 ENCOUNTER — Ambulatory Visit: Payer: Medicaid Other | Admitting: Radiation Oncology

## 2019-12-27 ENCOUNTER — Other Ambulatory Visit: Payer: Self-pay

## 2019-12-27 ENCOUNTER — Ambulatory Visit
Admission: RE | Admit: 2019-12-27 | Discharge: 2019-12-27 | Disposition: A | Discharge status: Home / Self Care | Payer: Medicaid Other | Source: Ambulatory Visit | Attending: Radiation Oncology | Admitting: Radiation Oncology

## 2019-12-27 ENCOUNTER — Encounter: Payer: Self-pay | Admitting: Radiation Oncology

## 2019-12-27 VITALS — BP 109/72 | HR 75 | Temp 98.7°F | Resp 18 | Ht 72.0 in | Wt 133.6 lb

## 2019-12-27 DIAGNOSIS — C7931 Secondary malignant neoplasm of brain: Secondary | ICD-10-CM

## 2019-12-27 DIAGNOSIS — C3411 Malignant neoplasm of upper lobe, right bronchus or lung: Secondary | ICD-10-CM

## 2019-12-27 DIAGNOSIS — C7951 Secondary malignant neoplasm of bone: Secondary | ICD-10-CM

## 2019-12-27 MED ORDER — TRAMADOL HCL 50 MG PO TABS: 50.0000 mg | ORAL_TABLET | Freq: Every evening | ORAL | 0 refills | Status: DC | PRN

## 2019-12-27 NOTE — Progress Notes (Signed)
Radiation Oncology         [phone]) [phone] ________________________________  Name: Brandon Ferguson        MRN: [id number]  Date of Service: 12/27/2019 DOB: [birth year]  [id number], No Pcp Per  Derek Jack, MD     REFERRING PHYSICIAN: Derek Jack, MD   DIAGNOSIS: The primary encounter diagnosis was Malignant neoplasm of right upper lobe of lung ([place]). Diagnoses of Metastatic cancer to brain [facility]) and Bone metastasis ([place]) were also pertinent to this visit.   HISTORY OF PRESENT ILLNESS: Brandon Ferguson is a 59 y.o. male seen at the request of 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  The patient is a known patient to our service, he was treated with whole brain radiation in 2020 as well as to the right lung, lumbar spine, right acetabulum and right pubic ramus.  He has been under the care of [name] and also follows with Dr. Mickeal Skinner.  The patient has received carboplatin Keytruda and Alimta between July 05, 2019 through September 20, 2019.  He is currently receiving Keytruda and Alimta maintenance.  When he was seen on 12/14/2019 he had been experiencing low back pain, his most recent CT chest abdomen pelvis from 12/09/2019 was also reviewed, and showed mixed response with significant reduction in his right upper lobe mass however the right iliac crest has a lesion that has increased in size with soft tissue mass and lytic destruction, most of the further bone disease appears stable.  His most recent MRI of the brain on 11/12/2019 did show a little bit of inflammatory change felt to be due to prior radiotherapy particularly in the transfalcine region and is being watched.  He is seen today to discuss options of treatment to his right iliac region.     PREVIOUS RADIATION THERAPY: Yes   05/30/2019-06/13/2018: The right lung target including T3,  lumbar spine, right acetabulum and right pubic ramus were all treated to 30  Gy in 10 fractions.   05/04/2019-05/19/2019: The whole brain was treated to 30 Gy in 10 fractions    PAST MEDICAL HISTORY:  Past Medical History:  Diagnosis Date  . Cancer ([place])   . GERD (gastroesophageal reflux disease)   . Iron deficiency   . Port-A-Cath in place 06/17/2019  . Vitamin D deficiency        PAST SURGICAL HISTORY: Past Surgical History:  Procedure Laterality Date  . KNEE SURGERY    . PORTACATH PLACEMENT Left 06/29/2019   Procedure: INSERTION PORT-A-CATH;  Surgeon: Aviva Signs, MD;  Location: [facility];  Service: General;  Laterality: Left;  [name] Kitchen VIDEO BRONCHOSCOPY Bilateral 04/29/2019   Procedure: VIDEO BRONCHOSCOPY WITH FLUORO;  Surgeon: Laurin Coder, MD;  Location: [facility] END[facility];  Service: Endoscopy;  Laterality: Bilateral;  . VIDEO BRONCHOSCOPY Bilateral 05/03/2019   Procedure: VIDEO BRONCHOSCOPY WITHOUT FLUORO;  Surgeon: Rigoberto Noel, MD;  Location: [facility];  Service: Cardiopulmonary;  Laterality: Bilateral;     FAMILY HISTORY:  Family History  Problem Relation Age of Onset  . Breast cancer Mother   . Cirrhosis Father   . Alcohol abuse Father   . Hypertension Sister   . Cancer Brother   . Hypertension Sister   . Hypertension Sister   . Hypertension Sister   . Hypertension Sister      SOCIAL HISTORY:  reports that he has been smoking cigarettes. He has been smoking about 1.00 pack per day. He has never used  smokeless tobacco. He reports current alcohol use of about 6.0 standard drinks of alcohol per week. He reports current drug use. Drug: Marijuana. The patient is single and he lives in [place]. His sister [place] often accompanies him to office visits.   ALLERGIES: Patient has no known allergies.   MEDICATIONS:  Current Outpatient Medications  Medication Sig Dispense Refill  . acetaminophen (TYLENOL) 500 MG tablet Take 1,000 mg by mouth every 6 (six) hours as needed for moderate pain or headache.     . CARBOPLATIN IV Inject into the  vein every 21 ( twenty-one) days. Every 3 weeks x 4 cycles    . ferrous sulfate 325 (65 FE) MG EC tablet Take 325 mg by mouth daily.    . folic acid (FOLVITE) 1 MG tablet Take 1 tablet (1 mg total) by mouth daily. 30 tablet 3  . levETIRAcetam (KEPPRA) 500 MG tablet Take 1 tablet (500 mg total) by mouth 2 (two) times daily. 60 tablet 3  . magnesium oxide (MAG-OX) 400 (241.3 Mg) MG tablet Take 1 tablet (400 mg total) by mouth 2 (two) times daily. 60 tablet 3  . pantoprazole (PROTONIX) 40 MG tablet Take 1 tablet by mouth once daily 30 tablet 0  . pantoprazole (PROTONIX) 40 MG tablet Take 1 tablet (40 mg total) by mouth daily. 30 tablet 3  . PEMBROLIZUMAB IV Inject into the vein every 21 ( twenty-one) days.    [name] Kitchen PEMEtrexed 500 mg/m2 in sodium chloride 0.9 % 100 mL Inject 500 mg/m2 into the vein every 21 ( twenty-one) days. Every 3 weeks x 4 cycles    . traMADol (ULTRAM) 50 MG tablet TAKE 1 TABLET BY MOUTH AT BEDTIME AS NEEDED 30 tablet 0  . lidocaine-prilocaine (EMLA) cream Apply a small amount to port a cath site and cover with plastic wrap 1 hour prior to chemotherapy appointments (Patient not taking: Reported on 12/14/2019) 30 g 3  . polyethylene glycol (MIRALAX / GLYCOLAX) 17 g packet Take 17 g by mouth daily as needed. (Patient not taking: Reported on 12/14/2019) 14 each 0  . prochlorperazine (COMPAZINE) 10 MG tablet Take 1 tablet (10 mg total) by mouth every 6 (six) hours as needed (Nausea or vomiting). (Patient not taking: Reported on 12/14/2019) 30 tablet 1   No current facility-administered medications for this encounter.     REVIEW OF SYSTEMS: On review of systems, the patient reports that he is doing well overall. He has some dry skin and a patch of this along his left shoulder. He reports he is also having some pain in his digits on both hands and is unsure if this is arthritis or something else, though it has started in the last few weeks. He describes pain in his right posterior hip region and  states this keeps him up at night and he needs a refill of his Ultram. He  denies any chest pain, shortness of breath, cough, fevers, chills, night sweats, unintended weight changes. He denies any bowel or bladder disturbances, and denies abdominal pain, nausea or vomiting. He denies any new musculoskeletal or joint aches or pains. A complete review of systems is obtained and is otherwise negative.     PHYSICAL EXAM:  Wt Readings from Last 3 Encounters:  12/27/19 133 lb 9.6 oz (60.6 kg)  12/14/19 135 lb 11.2 oz (61.6 kg)  11/22/19 136 lb 12.8 oz (62.1 kg)   Temp Readings from Last 3 Encounters:  12/27/19 98.7 F (37.1 C)  12/14/19 97.8 F (36.6 C) (  Oral)  12/14/19 (!) 97.3 F (36.3 C) (Temporal)   BP Readings from Last 3 Encounters:  12/27/19 109/72  12/14/19 106/62  12/14/19 121/70   Pulse Readings from Last 3 Encounters:  12/27/19 75  12/14/19 68  12/14/19 75   Pain Assessment Pain Score: 0-No pain/10  In general this is a thin and chronically ill appearing African American male in no acute distress. He's alert and oriented x4 and appropriate throughout the examination. Cardiopulmonary assessment is negative for acute distress and he exhibits normal effort. He has a 2.5 cm area of dry hyperpigmentation along his right shoulder. It is nonspecific. No other areas are noted of his skin and his left chest PAC site looks intact without erythema. No visible joint deformities are noted of his hands.     ECOG = 1  0 - Asymptomatic (Fully active, able to carry on all predisease activities without restriction)  1 - Symptomatic but completely ambulatory (Restricted in physically strenuous activity but ambulatory and able to carry out work of a light or sedentary nature. For example, light housework, office work)  2 - Symptomatic, <50% in bed during the day (Ambulatory and capable of all self care but unable to carry out any work activities. Up and about more than 50% of waking  hours)  3 - Symptomatic, >50% in bed, but not bedbound (Capable of only limited self-care, confined to bed or chair 50% or more of waking hours)  4 - Bedbound (Completely disabled. Cannot carry on any self-care. Totally confined to bed or chair)  5 - Death   Eustace Pen MM, Creech RH, Tormey DC, et al. [phone]). "Toxicity and response criteria of the [facility]". [place] Oncol. 5 (6): 649-55    LABORATORY DATA:  Lab Results  Component Value Date   WBC 4.9 12/14/2019   HGB 8.4 (L) 12/14/2019   HCT 27.5 (L) 12/14/2019   MCV 99.6 12/14/2019   PLT 320 12/14/2019   Lab Results  Component Value Date   NA 137 12/14/2019   K 3.1 (L) 12/14/2019   CL 101 12/14/2019   CO2 25 12/14/2019   Lab Results  Component Value Date   ALT 9 12/14/2019   AST 18 12/14/2019   ALKPHOS 155 (H) 12/14/2019   BILITOT 0.4 12/14/2019      RADIOGRAPHY: CT Chest W Contrast  Result Date: 12/13/2019 CLINICAL DATA:  Right upper lobe metastatic lung cancer restaging EXAM: CT CHEST, ABDOMEN, AND PELVIS WITH CONTRAST TECHNIQUE: Multidetector CT imaging of the chest, abdomen and pelvis was performed following the standard protocol during bolus administration of intravenous contrast. CONTRAST:  131mL OMNIPAQUE IOHEXOL 300 MG/ML  SOLN COMPARISON:  09/07/2019 FINDINGS: CT CHEST FINDINGS Cardiovascular: Left Port-A-Cath tip: SVC. Atherosclerotic calcification of the aortic arch and the left anterior descending coronary artery. Mediastinum/Nodes: Circumferential distal esophageal wall thickening, the most common cause would be esophagitis. No individually enlarged thoracic lymph nodes are identified. Lungs/Pleura: The region of solid masslike consolidation posteriorly in the right upper lobe measures 3.6 by 2.4 cm on image 69/3, formerly 5.0 by 3.8 cm by my measurement, substantially reduced. Bandlike scarring or atelectasis anteriorly in the right upper lobe appears mildly increased. Patchy opacities in  the superior segment right lower lobe and to a lesser degree in the right upper lobe are increased with associated airway thickening, some of this appearance could be from radiation pneumonitis given proximity to the tumor. A component of lymphangitic spread of tumor is not completely excluded. Emphysema  noted with apical bulla bilaterally. 3 mm triangular left lower lobe pleural-based nodule on image 155/3, stable. Musculoskeletal: Scattered osseous metastatic lesions demonstrate increased sclerosis compared to the 09/07/2019 exam, but similar distribution. Mild increase in sclerotic periostitis associated with the left tenth rib lesion, with mild surrounding soft tissue prominence between the ribs and the pleural surface. CT ABDOMEN PELVIS FINDINGS Hepatobiliary: Unremarkable Pancreas: Unremarkable Spleen: Unremarkable Adrenals/Urinary Tract: Suspected 4 mm in long axis left kidney lower pole renal calculus, image 79/2. There is fullness of the left adrenal gland without a well-defined mass. Stomach/Bowel: Unremarkable Vascular/Lymphatic: Aortoiliac atherosclerotic vascular disease. Reproductive: Considerable prostatomegaly. Other: No supplemental non-categorized findings. Musculoskeletal: Enlarging destructive/lytic right iliac crest lesion with extraosseous extension and expanding soft tissue component measuring 5.6 by 3.6 cm. This tumor was previously primarily intraosseous and measured about 2.9 by 1.3 cm on 09/07/2019. As in the chest, there is some increased sclerosis of the previously existing lesions, including along the margins of the large left sacral lesion. There is better definition of a pathologic fracture through the anterior margin of the left sacral lesion, for example on image 99/2. Increase conspicuity of pathologic fracture of the right superior pubic ramus laterally similar appearance of extensive compression fracture at L4 which appears to be pathologic and which is associated with  significant central narrowing of the thecal sac at the L4-5 level. Epidural tumor at this level is not readily excluded IMPRESSION: 1. Overall mixed appearance, with significant reduction in size of the region of solid masslike consolidation posteriorly in the right upper lobe corresponding to the original tumor; but with clear increase in bony lytic destruction in soft tissue mass appearance of the lytic right iliac crest lesion. Most of the other bony lesions appear similar in size and distribution compared to the prior exam, although with increased sclerosis. This increased sclerosis is otherwise nonspecific. Pathologic fractures in the left sacrum and right superior pubic ramus are again observed although have mildly increased conspicuity due to the increased sclerosis in the associated pathologic bony lesions. 2. Patchy opacities in the superior segment right lower lobe and to a lesser degree in the right upper lobe are increased with associated airway thickening, some of this appearance could be from radiation pneumonitis given proximity to the tumor. A component of lymphangitic spread of tumor is not completely excluded. 3. Other imaging findings of potential clinical significance: Coronary atherosclerosis. Circumferential distal esophageal wall thickening, the most common cause would be esophagitis. Considerable prostatomegaly. Suspected 4 mm in long axis left kidney lower pole renal calculus. 4. Emphysema and aortic atherosclerosis. Aortic Atherosclerosis (ICD10-I70.0) and Emphysema (ICD10-J43.9). Electronically Signed   By: Van Clines M.D.   On: 12/13/2019 06:12   CT Abdomen Pelvis W Contrast  Result Date: 12/13/2019 CLINICAL DATA:  Right upper lobe metastatic lung cancer restaging EXAM: CT CHEST, ABDOMEN, AND PELVIS WITH CONTRAST TECHNIQUE: Multidetector CT imaging of the chest, abdomen and pelvis was performed following the standard protocol during bolus administration of intravenous contrast.  CONTRAST:  179mL OMNIPAQUE IOHEXOL 300 MG/ML  SOLN COMPARISON:  09/07/2019 FINDINGS: CT CHEST FINDINGS Cardiovascular: Left Port-A-Cath tip: SVC. Atherosclerotic calcification of the aortic arch and the left anterior descending coronary artery. Mediastinum/Nodes: Circumferential distal esophageal wall thickening, the most common cause would be esophagitis. No individually enlarged thoracic lymph nodes are identified. Lungs/Pleura: The region of solid masslike consolidation posteriorly in the right upper lobe measures 3.6 by 2.4 cm on image 69/3, formerly 5.0 by 3.8 cm by my measurement, substantially reduced. Bandlike scarring  or atelectasis anteriorly in the right upper lobe appears mildly increased. Patchy opacities in the superior segment right lower lobe and to a lesser degree in the right upper lobe are increased with associated airway thickening, some of this appearance could be from radiation pneumonitis given proximity to the tumor. A component of lymphangitic spread of tumor is not completely excluded. Emphysema noted with apical bulla bilaterally. 3 mm triangular left lower lobe pleural-based nodule on image 155/3, stable. Musculoskeletal: Scattered osseous metastatic lesions demonstrate increased sclerosis compared to the 09/07/2019 exam, but similar distribution. Mild increase in sclerotic periostitis associated with the left tenth rib lesion, with mild surrounding soft tissue prominence between the ribs and the pleural surface. CT ABDOMEN PELVIS FINDINGS Hepatobiliary: Unremarkable Pancreas: Unremarkable Spleen: Unremarkable Adrenals/Urinary Tract: Suspected 4 mm in long axis left kidney lower pole renal calculus, image 79/2. There is fullness of the left adrenal gland without a well-defined mass. Stomach/Bowel: Unremarkable Vascular/Lymphatic: Aortoiliac atherosclerotic vascular disease. Reproductive: Considerable prostatomegaly. Other: No supplemental non-categorized findings. Musculoskeletal:  Enlarging destructive/lytic right iliac crest lesion with extraosseous extension and expanding soft tissue component measuring 5.6 by 3.6 cm. This tumor was previously primarily intraosseous and measured about 2.9 by 1.3 cm on 09/07/2019. As in the chest, there is some increased sclerosis of the previously existing lesions, including along the margins of the large left sacral lesion. There is better definition of a pathologic fracture through the anterior margin of the left sacral lesion, for example on image 99/2. Increase conspicuity of pathologic fracture of the right superior pubic ramus laterally similar appearance of extensive compression fracture at L4 which appears to be pathologic and which is associated with significant central narrowing of the thecal sac at the L4-5 level. Epidural tumor at this level is not readily excluded IMPRESSION: 1. Overall mixed appearance, with significant reduction in size of the region of solid masslike consolidation posteriorly in the right upper lobe corresponding to the original tumor; but with clear increase in bony lytic destruction in soft tissue mass appearance of the lytic right iliac crest lesion. Most of the other bony lesions appear similar in size and distribution compared to the prior exam, although with increased sclerosis. This increased sclerosis is otherwise nonspecific. Pathologic fractures in the left sacrum and right superior pubic ramus are again observed although have mildly increased conspicuity due to the increased sclerosis in the associated pathologic bony lesions. 2. Patchy opacities in the superior segment right lower lobe and to a lesser degree in the right upper lobe are increased with associated airway thickening, some of this appearance could be from radiation pneumonitis given proximity to the tumor. A component of lymphangitic spread of tumor is not completely excluded. 3. Other imaging findings of potential clinical significance: Coronary  atherosclerosis. Circumferential distal esophageal wall thickening, the most common cause would be esophagitis. Considerable prostatomegaly. Suspected 4 mm in long axis left kidney lower pole renal calculus. 4. Emphysema and aortic atherosclerosis. Aortic Atherosclerosis (ICD10-I70.0) and Emphysema (ICD10-J43.9). Electronically Signed   By: Van Clines M.D.   On: 12/13/2019 06:12       IMPRESSION/PLAN: 1. 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 Dr. Lisbeth Renshaw discusses the pathology findings and reviews the nature of bone metastases and the rationale to consider palliative radiotherapy to the right iliac region. We discussed the risks, benefits, short, and long term effects of radiotherapy, and the patient is interested in proceeding. Dr. Lisbeth Renshaw discusses the delivery and logistics of radiotherapy and anticipates a course  of 3 weeks of radiotherapy given his ongoing systemic therapy. Written consent was obtained today. He was originally going to stay to simulate but had to leave due to transportation. We will coordinate rescheduling his appointment.  2. Pain secondary to bony disease. The patient needs a refill of his Ultram which I will send in. 3. Joint aches in his digits and skin concerns. I suggested he try OTC NSAIDs for his aches in his hands and topical hydrocortisone cream for his dry skin on his shoulder. If these do not improve his symptoms, he will contact his PCP. 4. Transportation Concerns. The patient would like to have transportation assistance. I've asked our nursing team to reach out to the transportation department to help him coordinate rides for his upcoming treatment.  In a visit lasting 45 minutes, greater than 50% of the time was spent face to face discussing the patient's condition, in preparation for the discussion, and coordinating the patient's care.    The above documentation reflects my direct findings during this shared patient visit.  Please see the separate note by Dr. Lisbeth Renshaw on this date for the remainder of the patient's plan of care.    Carola Rhine, PAC

## 2019-12-30 ENCOUNTER — Ambulatory Visit
Admission: RE | Admit: 2019-12-30 | Discharge: 2019-12-30 | Disposition: A | Discharge status: Home / Self Care | Payer: Medicaid Other | Source: Ambulatory Visit | Attending: Radiation Oncology | Admitting: Radiation Oncology

## 2019-12-30 ENCOUNTER — Other Ambulatory Visit: Payer: Self-pay

## 2019-12-30 DIAGNOSIS — C7951 Secondary malignant neoplasm of bone: Secondary | ICD-10-CM | POA: Diagnosis present

## 2019-12-30 DIAGNOSIS — C3411 Malignant neoplasm of upper lobe, right bronchus or lung: Secondary | ICD-10-CM | POA: Insufficient documentation

## 2020-01-04 ENCOUNTER — Inpatient Hospital Stay (HOSPITAL_BASED_OUTPATIENT_CLINIC_OR_DEPARTMENT_OTHER): Payer: Medicaid Other | Admitting: Hematology

## 2020-01-04 ENCOUNTER — Inpatient Hospital Stay (HOSPITAL_COMMUNITY): Payer: Medicaid Other

## 2020-01-04 ENCOUNTER — Other Ambulatory Visit: Payer: Self-pay

## 2020-01-04 VITALS — BP 111/64 | HR 70 | Temp 97.2°F | Resp 18

## 2020-01-04 VITALS — BP 113/69 | HR 82 | Temp 97.2°F | Resp 18 | Wt 130.6 lb

## 2020-01-04 DIAGNOSIS — Z95828 Presence of other vascular implants and grafts: Secondary | ICD-10-CM

## 2020-01-04 DIAGNOSIS — Z5112 Encounter for antineoplastic immunotherapy: Secondary | ICD-10-CM | POA: Diagnosis not present

## 2020-01-04 DIAGNOSIS — C3411 Malignant neoplasm of upper lobe, right bronchus or lung: Secondary | ICD-10-CM

## 2020-01-04 LAB — CBC WITH DIFFERENTIAL/PLATELET
Abs Immature Granulocytes: 0.03 10*3/uL (ref 0.00–0.07)
Basophils Absolute: 0 10*3/uL (ref 0.0–0.1)
Basophils Relative: 0 %
Eosinophils Absolute: 0.1 10*3/uL (ref 0.0–0.5)
Eosinophils Relative: 1 %
HCT: 25.7 % — ABNORMAL LOW (ref 39.0–52.0)
Hemoglobin: 7.7 g/dL — ABNORMAL LOW (ref 13.0–17.0)
Immature Granulocytes: 1 %
Lymphocytes Relative: 20 %
Lymphs Abs: 1.1 10*3/uL (ref 0.7–4.0)
MCH: 28.5 pg (ref 26.0–34.0)
MCHC: 30 g/dL (ref 30.0–36.0)
MCV: 95.2 fL (ref 80.0–100.0)
Monocytes Absolute: 0.9 10*3/uL (ref 0.1–1.0)
Monocytes Relative: 16 %
Neutro Abs: 3.4 10*3/uL (ref 1.7–7.7)
Neutrophils Relative %: 62 %
Platelets: 307 10*3/uL (ref 150–400)
RBC: 2.7 MIL/uL — ABNORMAL LOW (ref 4.22–5.81)
RDW: 15 % (ref 11.5–15.5)
WBC: 5.5 10*3/uL (ref 4.0–10.5)
nRBC: 0 % (ref 0.0–0.2)

## 2020-01-04 LAB — COMPREHENSIVE METABOLIC PANEL
ALT: 8 U/L (ref 0–44)
AST: 16 U/L (ref 15–41)
Albumin: 3.4 g/dL — ABNORMAL LOW (ref 3.5–5.0)
Alkaline Phosphatase: 157 U/L — ABNORMAL HIGH (ref 38–126)
Anion gap: 10 (ref 5–15)
BUN: 11 mg/dL (ref 6–20)
CO2: 25 mmol/L (ref 22–32)
Calcium: 9.9 mg/dL (ref 8.9–10.3)
Chloride: 99 mmol/L (ref 98–111)
Creatinine, Ser: 0.49 mg/dL — ABNORMAL LOW (ref 0.61–1.24)
GFR calc Af Amer: >60 mL/min (ref >60)
GFR calc non Af Amer: >60 mL/min (ref >60)
Glucose, Bld: 84 mg/dL (ref 70–99)
Potassium: 3.5 mmol/L (ref 3.5–5.1)
Sodium: 134 mmol/L — ABNORMAL LOW (ref 135–145)
Total Bilirubin: 0.4 mg/dL (ref 0.3–1.2)
Total Protein: 8.8 g/dL — ABNORMAL HIGH (ref 6.5–8.1)

## 2020-01-04 LAB — FERRITIN: Ferritin: 906 ng/mL — ABNORMAL HIGH (ref 24–336)

## 2020-01-04 LAB — IRON AND TIBC
Iron: 16 ug/dL — ABNORMAL LOW (ref 45–182)
Saturation Ratios: 6 % — ABNORMAL LOW (ref 17.9–39.5)
TIBC: 268 ug/dL (ref 250–450)
UIBC: 252 ug/dL

## 2020-01-04 LAB — TSH: TSH: 1.414 u[IU]/mL (ref 0.350–4.500)

## 2020-01-04 LAB — VITAMIN B12: Vitamin B-12: 3520 pg/mL — ABNORMAL HIGH (ref 180–914)

## 2020-01-04 LAB — FOLATE: Folate: 45.5 ng/mL (ref >5.9)

## 2020-01-04 MED ORDER — TRAMADOL HCL 50 MG PO TABS: 50.0000 mg | ORAL_TABLET | Freq: Every evening | ORAL | 0 refills | Status: DC | PRN

## 2020-01-04 MED ORDER — SODIUM CHLORIDE 0.9 % IV SOLN: Freq: Once | INTRAVENOUS | Status: AC

## 2020-01-04 MED ORDER — HEPARIN SOD (PORK) LOCK FLUSH 100 UNIT/ML IV SOLN
500.0000 [IU] | Freq: Once | INTRAVENOUS | Status: AC | PRN
  Administered 2020-01-04: 500 [IU]

## 2020-01-04 MED ORDER — SODIUM CHLORIDE 0.9 % IV SOLN
200.0000 mg | Freq: Once | INTRAVENOUS | Status: AC
  Administered 2020-01-04: 200 mg via INTRAVENOUS
  Filled 2020-01-04: qty 8

## 2020-01-04 MED ORDER — SODIUM CHLORIDE 0.9 % IV SOLN
500.0000 mg/m2 | Freq: Once | INTRAVENOUS | Status: AC
  Administered 2020-01-04: 900 mg via INTRAVENOUS
  Filled 2020-01-04: qty 16

## 2020-01-04 MED ORDER — CYANOCOBALAMIN 1000 MCG/ML IJ SOLN
1000.0000 ug | Freq: Once | INTRAMUSCULAR | Status: AC
  Administered 2020-01-04: 1000 ug via INTRAMUSCULAR
  Filled 2020-01-04: qty 1

## 2020-01-04 MED ORDER — SODIUM CHLORIDE 0.9% FLUSH
10.0000 mL | INTRAVENOUS | Status: DC | PRN
  Administered 2020-01-04: 10 mL via INTRAVENOUS

## 2020-01-04 MED ORDER — SODIUM CHLORIDE 0.9% FLUSH
10.0000 mL | INTRAVENOUS | Status: DC | PRN
  Administered 2020-01-04: 10 mL

## 2020-01-04 MED ORDER — PROCHLORPERAZINE MALEATE 10 MG PO TABS
10.0000 mg | ORAL_TABLET | Freq: Once | ORAL | Status: AC
  Administered 2020-01-04: 10 mg via ORAL
  Filled 2020-01-04: qty 1

## 2020-01-04 NOTE — Patient Instructions (Signed)
[facility] Discharge Instructions for Patients Receiving Chemotherapy   Beginning January 23rd 2017 lab work for the [facility] will be done in the  Main lab at [facility] on 1st floor. If you have a lab appointment with the [facility] please come in thru the  Main Entrance and check in at the main information desk   Today you received the following chemotherapy agents Keytruda and Alimta as well as Vit B12 injection. Follow-up as scheduled  To help prevent nausea and vomiting after your treatment, we encourage you to take your nausea medication   If you develop nausea and vomiting, or diarrhea that is not controlled by your medication, call the clinic.  The clinic phone number is [phone]. Office hours are Monday-Friday 8:30am-5:00pm.  BELOW ARE SYMPTOMS THAT SHOULD BE REPORTED IMMEDIATELY:  *FEVER GREATER THAN 101.0 F  *CHILLS WITH OR WITHOUT FEVER  NAUSEA AND VOMITING THAT IS NOT CONTROLLED WITH YOUR NAUSEA MEDICATION  *UNUSUAL SHORTNESS OF BREATH  *UNUSUAL BRUISING OR BLEEDING  TENDERNESS IN MOUTH AND THROAT WITH OR WITHOUT PRESENCE OF ULCERS  *URINARY PROBLEMS  *BOWEL PROBLEMS  UNUSUAL RASH Items with * indicate a potential emergency and should be followed up as soon as possible. If you have an emergency after office hours please contact your primary care physician or go to the nearest emergency department.  Please call the clinic during office hours if you have any questions or concerns.   You may also contact the Patient Navigator at [phone] should you have any questions or need assistance in obtaining follow up care.      Resources For Cancer Patients and their Caregivers ? [employer]: Can assist with transportation, wigs, general needs, runs Look Good Feel Better.        (201)539-3770 ? Cancer Care: Provides financial assistance, online support groups, medication/co-pay assistance.  1-800-813-HOPE  313 262 6710) ? [place] Assists [place] cancer patients and their families through emotional , educational and financial support.  9068206842 ? [place] DSS Where to apply for food stamps, Medicaid and utility assistance. 6204605937 ? [facility]: Transportation to medical appointments. (250) 014-3422 ? Social Security Administration: May apply for disability if have a Stage IV cancer. 202-501-0088 828-642-7127 ? [employer], Disability and Transit Services: Assists with nutrition, care and transit needs. 6291432004

## 2020-01-04 NOTE — Progress Notes (Signed)
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  PRIOR THERAPY: Carboplatin, Keytruda and Alimta x 4 cycles from 07/05/2019 through 09/20/2019  NGS Results: PD-L1 TPS 20%  CURRENT THERAPY: Maintenance Keytruda & Alimta  BRIEF ONCOLOGIC HISTORY:  Oncology History  Malignant neoplasm of right upper lobe of lung ([place])  05/02/2019 Initial Diagnosis   Malignant neoplasm of upper lobe of right lung ([place])   05/19/2019 Cancer Staging   Staging form: Lung, AJCC 8th Edition - Clinical stage from 05/19/2019: Stage IVB (cT2b, cN1, pM1c) - Signed by Derek Jack, MD on 05/19/2019   07/05/2019 -  Chemotherapy   The Brandon Ferguson had palonosetron (ALOXI) injection 0.25 mg, 0.25 mg, Intravenous,  Once, 4 of 4 cycles Administration: 0.25 mg (07/05/2019), 0.25 mg (08/16/2019), 0.25 mg (09/20/2019), 0.25 mg (07/26/2019) PEMEtrexed (ALIMTA) 900 mg in sodium chloride 0.9 % 100 mL chemo infusion, 500 mg/m2 = 900 mg, Intravenous,  Once, 8 of 9 cycles Dose modification: 400 mg/m2 (80 % of original dose 500 mg/m2, Cycle 4, Reason: Provider Judgment), 500 mg/m2 (100 % of original dose 500 mg/m2, Cycle 5, Reason: Provider Judgment) Administration: 900 mg (07/05/2019), 900 mg (08/16/2019), 700 mg (09/20/2019), 900 mg (10/11/2019), 900 mg (11/01/2019), 900 mg (07/26/2019), 900 mg (11/22/2019), 900 mg (12/14/2019) CARBOplatin (PARAPLATIN) 580 mg in sodium chloride 0.9 % 250 mL chemo infusion, 580 mg (100 % of original dose 581.5 mg), Intravenous,  Once, 4 of 4 cycles Dose modification:   (original dose 581.5 mg, Cycle 1),   (original dose 581.5 mg, Cycle 3),   (original dose 465.2 mg, Cycle 4, Reason: Provider Judgment),   (original dose 581.5 mg, Cycle 2) Administration: 580 mg (07/05/2019), 580 mg (08/16/2019), 470 mg (09/20/2019), 580 mg  (07/26/2019) fosaprepitant (EMEND) 150 mg in sodium chloride 0.9 % 145 mL IVPB, 150 mg, Intravenous,  Once, 4 of 4 cycles Administration: 150 mg (07/05/2019), 150 mg (08/16/2019), 150 mg (09/20/2019), 150 mg (07/26/2019) pembrolizumab (KEYTRUDA) 200 mg in sodium chloride 0.9 % 50 mL chemo infusion, 200 mg, Intravenous, Once, 8 of 9 cycles Administration: 200 mg (07/05/2019), 200 mg (08/16/2019), 200 mg (09/20/2019), 200 mg (10/11/2019), 200 mg (11/01/2019), 200 mg (07/26/2019), 200 mg (11/22/2019), 200 mg (12/14/2019)  for chemotherapy treatment.      CANCER STAGING: Cancer Staging Malignant neoplasm of right upper lobe of lung [facility]) Staging form: Lung, AJCC 8th Edition - Clinical stage from 05/19/2019: Stage IVB (cT2b, cN1, pM1c) - Signed by Derek Jack, MD on 05/19/2019   INTERVAL HISTORY:  [name], a 59 y.o. male, returns for routine follow-up and consideration for next cycle of chemotherapy. Brandon Ferguson was last seen on 12/14/2019.  Due for cycle #9 of Keytruda & Alimta today.   Overall, he tells me he has been feeling pretty well. He continues to have lumbar back pain; he is taking Tramadol once at night. Tylenol helps for 4 hours or so, but then the pain comes back. He does have some mild coughing with phlegm production.   Overall, he feels ready for next cycle of chemo today.   Her starts radiation therapy from 01/09/2020 to 01/20/2020.    REVIEW OF SYSTEMS:  Review of Systems  Constitutional: Positive for fatigue (mild).  HENT:  Negative.   Eyes: Negative.   Respiratory: Negative.   Cardiovascular: Negative.   Gastrointestinal: Positive  for diarrhea.  Endocrine: Negative.   Genitourinary: Negative.    Musculoskeletal: Negative.   Skin: Negative.   Neurological: Positive for numbness (mild toes & hands).  Hematological: Negative.   Psychiatric/Behavioral: Negative.   All other systems reviewed and are negative.   PAST MEDICAL/SURGICAL HISTORY:  Past Medical  History:  Diagnosis Date  . Cancer ([place])   . GERD (gastroesophageal reflux disease)   . Iron deficiency   . Port-A-Cath in place 06/17/2019  . Vitamin D deficiency    Past Surgical History:  Procedure Laterality Date  . KNEE SURGERY    . PORTACATH PLACEMENT Left 06/29/2019   Procedure: INSERTION PORT-A-CATH;  Surgeon: Aviva Signs, MD;  Location: [facility];  Service: General;  Laterality: Left;  [name] Kitchen VIDEO BRONCHOSCOPY Bilateral 04/29/2019   Procedure: VIDEO BRONCHOSCOPY WITH FLUORO;  Surgeon: Laurin Coder, MD;  Location: [facility];  Service: Endoscopy;  Laterality: Bilateral;  . VIDEO BRONCHOSCOPY Bilateral 05/03/2019   Procedure: VIDEO BRONCHOSCOPY WITHOUT FLUORO;  Surgeon: Rigoberto Noel, MD;  Location: [facility];  Service: Cardiopulmonary;  Laterality: Bilateral;    SOCIAL HISTORY:  Social History   Socioeconomic History  . Marital status: Single    Spouse name: Not on file  . Number of children: Not on file  . Years of education: Not on file  . Highest education level: Not on file  Occupational History  . Not on file  Tobacco Use  . Smoking status: Current Every Day Smoker    Packs/day: 1.00    Types: Cigarettes  . Smokeless tobacco: Never Used  . Tobacco comment: quit cigs 5 months ago  Vaping Use  . Vaping Use: Never used  Substance and Sexual Activity  . Alcohol use: Yes    Alcohol/week: 6.0 standard drinks    Types: 6 Cans of beer per week    Comment: pt drinks 3 large beers daily. hasnt had any in 5 months  . Drug use: Yes    Types: Marijuana    Comment: daily. as of Jan 18,2021 hasnt had any in 5 months  . Sexual activity: Not on file  Other Topics Concern  . Not on file  Social History Narrative  . Not on file   Social Determinants of Health   Financial Resource Strain: Low Risk   . Difficulty of Paying Living Expenses: Not very hard  Food Insecurity: Brandon Food Insecurity  . Worried About Charity fundraiser in the Last Year: Never true  . Ran  Out of Food in the Last Year: Never true  Transportation Needs: Brandon Transportation Needs  . Lack of Transportation (Medical): Brandon  . Lack of Transportation (Non-Medical): Brandon  Physical Activity: Inactive  . Days of Exercise per Week: 0 days  . Minutes of Exercise per Session: 0 min  Stress: Brandon Stress Concern Present  . Feeling of Stress : Only a little  Social Connections: Moderately Isolated  . Frequency of Communication with Friends and Family: More than three times a week  . Frequency of Social Gatherings with Friends and Family: More than three times a week  . Attends Religious Services: 1 to 4 times per year  . Active Member of Clubs or Organizations: Brandon  . Attends Archivist Meetings: Never  . Marital Status: Never married  Intimate Partner Violence: Not At Risk  . Fear of Current or Ex-Partner: Brandon  . Emotionally Abused: Brandon  . Physically Abused: Brandon  . Sexually Abused: Brandon    FAMILY HISTORY:  Family History  Problem Relation Age of Onset  . Breast cancer Mother   . Cirrhosis Father   . Alcohol abuse Father   . Hypertension Sister   . Cancer Brother   . Hypertension Sister   . Hypertension Sister   . Hypertension Sister   . Hypertension Sister     CURRENT MEDICATIONS:  Current Outpatient Medications  Medication Sig Dispense Refill  . CARBOPLATIN IV Inject into the vein every 21 ( twenty-one) days. Every 3 weeks x 4 cycles    . ferrous sulfate 325 (65 FE) MG EC tablet Take 325 mg by mouth daily.    . folic acid (FOLVITE) 1 MG tablet Take 1 tablet (1 mg total) by mouth daily. 30 tablet 3  . levETIRAcetam (KEPPRA) 500 MG tablet Take 1 tablet (500 mg total) by mouth 2 (two) times daily. 60 tablet 3  . magnesium oxide (MAG-OX) 400 (241.3 Mg) MG tablet Take 1 tablet (400 mg total) by mouth 2 (two) times daily. 60 tablet 3  . pantoprazole (PROTONIX) 40 MG tablet Take 1 tablet by mouth once daily 30 tablet 0  . pantoprazole (PROTONIX) 40 MG tablet Take 1 tablet (40 mg  total) by mouth daily. 30 tablet 3  . PEMBROLIZUMAB IV Inject into the vein every 21 ( twenty-one) days.    [name] Kitchen PEMEtrexed 500 mg/m2 in sodium chloride 0.9 % 100 mL Inject 500 mg/m2 into the vein every 21 ( twenty-one) days. Every 3 weeks x 4 cycles    . traMADol (ULTRAM) 50 MG tablet Take 1 tablet (50 mg total) by mouth at bedtime as needed. 30 tablet 0  . acetaminophen (TYLENOL) 500 MG tablet Take 1,000 mg by mouth every 6 (six) hours as needed for moderate pain or headache.  (Brandon Ferguson not taking: Reported on 01/04/2020)    . lidocaine-prilocaine (EMLA) cream Apply a small amount to port a cath site and cover with plastic wrap 1 hour prior to chemotherapy appointments (Brandon Ferguson not taking: Reported on 01/04/2020) 30 g 3  . polyethylene glycol (MIRALAX / GLYCOLAX) 17 g packet Take 17 g by mouth daily as needed. (Brandon Ferguson not taking: Reported on 01/04/2020) 14 each 0  . prochlorperazine (COMPAZINE) 10 MG tablet Take 1 tablet (10 mg total) by mouth every 6 (six) hours as needed (Nausea or vomiting). (Brandon Ferguson not taking: Reported on 01/04/2020) 30 tablet 1   Brandon current facility-administered medications for this visit.   Facility-Administered Medications Ordered in Other Visits  Medication Dose Route Frequency Provider Last Rate Last Admin  . sodium chloride flush (NS) 0.9 % injection 10 mL  10 mL Intravenous PRN Derek Jack, MD   10 mL at 01/04/20 1006    ALLERGIES:  Brandon Known Allergies  PHYSICAL EXAM:  Performance status (ECOG): 1 - Symptomatic but completely ambulatory  Vitals:   01/04/20 0940  BP: 113/69  Pulse: 82  Resp: 18  Temp: (!) 97.2 F (36.2 C)  SpO2: 100%   Wt Readings from Last 3 Encounters:  01/04/20 130 lb 9.6 oz (59.2 kg)  12/27/19 133 lb 9.6 oz (60.6 kg)  12/14/19 135 lb 11.2 oz (61.6 kg)   Physical Exam Vitals reviewed.  Constitutional:      Appearance: Normal appearance.  HENT:     Mouth/Throat:     Mouth: Mucous membranes are moist.  Eyes:     Pupils:  Pupils are equal, round, and reactive to light.  Cardiovascular:     Rate and Rhythm: Normal rate and regular rhythm.  Pulses: Normal pulses.     Heart sounds: Normal heart sounds.  Pulmonary:     Effort: Pulmonary effort is normal.     Breath sounds: Normal breath sounds.  Abdominal:     Palpations: Abdomen is soft. There is Brandon mass.     Tenderness: There is Brandon abdominal tenderness.  Musculoskeletal:     Right lower leg: Brandon edema.     Left lower leg: Brandon edema.  Neurological:     Mental Status: He is alert and oriented to person, place, and time.  Psychiatric:        Mood and Affect: Mood normal.        Behavior: Behavior normal.     LABORATORY DATA:  I have reviewed the labs as listed.  CBC Latest Ref Rng & Units 12/14/2019 11/22/2019 11/01/2019  WBC 4.0 - 10.5 K/uL 4.9 4.8 4.1  Hemoglobin 13.0 - 17.0 g/dL 8.4(L) 8.5(L) 8.7(L)  Hematocrit 39 - 52 % 27.5(L) 27.6(L) 27.5(L)  Platelets 150 - 400 K/uL 320 324 295   CMP Latest Ref Rng & Units 12/14/2019 11/22/2019 11/01/2019  Glucose 70 - 99 mg/dL 127(H) 100(H) 156(H)  BUN 6 - 20 mg/dL 5(L) 9 12  Creatinine 0.61 - 1.24 mg/dL 0.58(L) 0.47(L) 0.50(L)  Sodium 135 - 145 mmol/L 137 138 136  Potassium 3.5 - 5.1 mmol/L 3.1(L) 3.3(L) 3.4(L)  Chloride 98 - 111 mmol/L 101 102 98  CO2 22 - 32 mmol/L _0 Calcium 8.9 - 10.3 mg/dL 9.8 9.8 9.9  Total Protein 6.5 - 8.1 g/dL 8.3(H) 8.2(H) 8.1  Total Bilirubin 0.3 - 1.2 mg/dL 0.4 0.4 0.5  Alkaline Phos 38 - 126 U/L 155(H) 150(H) 147(H)  AST 15 - 41 U/L _1 ALT 0 - 44 U/L _2 DIAGNOSTIC IMAGING:  I have independently reviewed the scans and discussed with the Brandon Ferguson. CT Chest W Contrast  Result Date: 12/13/2019 CLINICAL DATA:  Right upper lobe metastatic lung cancer restaging EXAM: CT CHEST, ABDOMEN, AND PELVIS WITH CONTRAST TECHNIQUE: Multidetector CT imaging of the chest, abdomen and pelvis was performed following the standard protocol during bolus administration of  intravenous contrast. CONTRAST:  143m OMNIPAQUE IOHEXOL 300 MG/ML  SOLN COMPARISON:  09/07/2019 FINDINGS: CT CHEST FINDINGS Cardiovascular: Left Port-A-Cath tip: SVC. Atherosclerotic calcification of the aortic arch and the left anterior descending coronary artery. Mediastinum/Nodes: Circumferential distal esophageal wall thickening, the most common cause would be esophagitis. Brandon individually enlarged thoracic lymph nodes are identified. Lungs/Pleura: The region of solid masslike consolidation posteriorly in the right upper lobe measures 3.6 by 2.4 cm on image 69/3, formerly 5.0 by 3.8 cm by my measurement, substantially reduced. Bandlike scarring or atelectasis anteriorly in the right upper lobe appears mildly increased. Patchy opacities in the superior segment right lower lobe and to a lesser degree in the right upper lobe are increased with associated airway thickening, some of this appearance could be from radiation pneumonitis given proximity to the tumor. A component of lymphangitic spread of tumor is not completely excluded. Emphysema noted with apical bulla bilaterally. 3 mm triangular left lower lobe pleural-based nodule on image 155/3, stable. Musculoskeletal: Scattered osseous metastatic lesions demonstrate increased sclerosis compared to the 09/07/2019 exam, but similar distribution. Mild increase in sclerotic periostitis associated with the left tenth rib lesion, with mild surrounding soft tissue prominence between the ribs and the pleural surface. CT ABDOMEN PELVIS FINDINGS Hepatobiliary: Unremarkable Pancreas: Unremarkable Spleen: Unremarkable Adrenals/Urinary Tract: Suspected 4 mm in long  axis left kidney lower pole renal calculus, image 79/2. There is fullness of the left adrenal gland without a well-defined mass. Stomach/Bowel: Unremarkable Vascular/Lymphatic: Aortoiliac atherosclerotic vascular disease. Reproductive: Considerable prostatomegaly. Other: Brandon supplemental non-categorized findings.  Musculoskeletal: Enlarging destructive/lytic right iliac crest lesion with extraosseous extension and expanding soft tissue component measuring 5.6 by 3.6 cm. This tumor was previously primarily intraosseous and measured about 2.9 by 1.3 cm on 09/07/2019. As in the chest, there is some increased sclerosis of the previously existing lesions, including along the margins of the large left sacral lesion. There is better definition of a pathologic fracture through the anterior margin of the left sacral lesion, for example on image 99/2. Increase conspicuity of pathologic fracture of the right superior pubic ramus laterally similar appearance of extensive compression fracture at L4 which appears to be pathologic and which is associated with significant central narrowing of the thecal sac at the L4-5 level. Epidural tumor at this level is not readily excluded IMPRESSION: 1. Overall mixed appearance, with significant reduction in size of the region of solid masslike consolidation posteriorly in the right upper lobe corresponding to the original tumor; but with clear increase in bony lytic destruction in soft tissue mass appearance of the lytic right iliac crest lesion. Most of the other bony lesions appear similar in size and distribution compared to the prior exam, although with increased sclerosis. This increased sclerosis is otherwise nonspecific. Pathologic fractures in the left sacrum and right superior pubic ramus are again observed although have mildly increased conspicuity due to the increased sclerosis in the associated pathologic bony lesions. 2. Patchy opacities in the superior segment right lower lobe and to a lesser degree in the right upper lobe are increased with associated airway thickening, some of this appearance could be from radiation pneumonitis given proximity to the tumor. A component of lymphangitic spread of tumor is not completely excluded. 3. Other imaging findings of potential clinical  significance: Coronary atherosclerosis. Circumferential distal esophageal wall thickening, the most common cause would be esophagitis. Considerable prostatomegaly. Suspected 4 mm in long axis left kidney lower pole renal calculus. 4. Emphysema and aortic atherosclerosis. Aortic Atherosclerosis (ICD10-I70.0) and Emphysema (ICD10-J43.9). Electronically Signed   By: Van Clines M.D.   On: 12/13/2019 06:12   CT Abdomen Pelvis W Contrast  Result Date: 12/13/2019 CLINICAL DATA:  Right upper lobe metastatic lung cancer restaging EXAM: CT CHEST, ABDOMEN, AND PELVIS WITH CONTRAST TECHNIQUE: Multidetector CT imaging of the chest, abdomen and pelvis was performed following the standard protocol during bolus administration of intravenous contrast. CONTRAST:  166m OMNIPAQUE IOHEXOL 300 MG/ML  SOLN COMPARISON:  09/07/2019 FINDINGS: CT CHEST FINDINGS Cardiovascular: Left Port-A-Cath tip: SVC. Atherosclerotic calcification of the aortic arch and the left anterior descending coronary artery. Mediastinum/Nodes: Circumferential distal esophageal wall thickening, the most common cause would be esophagitis. Brandon individually enlarged thoracic lymph nodes are identified. Lungs/Pleura: The region of solid masslike consolidation posteriorly in the right upper lobe measures 3.6 by 2.4 cm on image 69/3, formerly 5.0 by 3.8 cm by my measurement, substantially reduced. Bandlike scarring or atelectasis anteriorly in the right upper lobe appears mildly increased. Patchy opacities in the superior segment right lower lobe and to a lesser degree in the right upper lobe are increased with associated airway thickening, some of this appearance could be from radiation pneumonitis given proximity to the tumor. A component of lymphangitic spread of tumor is not completely excluded. Emphysema noted with apical bulla bilaterally. 3 mm triangular left lower lobe pleural-based nodule  on image 155/3, stable. Musculoskeletal: Scattered osseous  metastatic lesions demonstrate increased sclerosis compared to the 09/07/2019 exam, but similar distribution. Mild increase in sclerotic periostitis associated with the left tenth rib lesion, with mild surrounding soft tissue prominence between the ribs and the pleural surface. CT ABDOMEN PELVIS FINDINGS Hepatobiliary: Unremarkable Pancreas: Unremarkable Spleen: Unremarkable Adrenals/Urinary Tract: Suspected 4 mm in long axis left kidney lower pole renal calculus, image 79/2. There is fullness of the left adrenal gland without a well-defined mass. Stomach/Bowel: Unremarkable Vascular/Lymphatic: Aortoiliac atherosclerotic vascular disease. Reproductive: Considerable prostatomegaly. Other: Brandon supplemental non-categorized findings. Musculoskeletal: Enlarging destructive/lytic right iliac crest lesion with extraosseous extension and expanding soft tissue component measuring 5.6 by 3.6 cm. This tumor was previously primarily intraosseous and measured about 2.9 by 1.3 cm on 09/07/2019. As in the chest, there is some increased sclerosis of the previously existing lesions, including along the margins of the large left sacral lesion. There is better definition of a pathologic fracture through the anterior margin of the left sacral lesion, for example on image 99/2. Increase conspicuity of pathologic fracture of the right superior pubic ramus laterally similar appearance of extensive compression fracture at L4 which appears to be pathologic and which is associated with significant central narrowing of the thecal sac at the L4-5 level. Epidural tumor at this level is not readily excluded IMPRESSION: 1. Overall mixed appearance, with significant reduction in size of the region of solid masslike consolidation posteriorly in the right upper lobe corresponding to the original tumor; but with clear increase in bony lytic destruction in soft tissue mass appearance of the lytic right iliac crest lesion. Most of the other bony lesions  appear similar in size and distribution compared to the prior exam, although with increased sclerosis. This increased sclerosis is otherwise nonspecific. Pathologic fractures in the left sacrum and right superior pubic ramus are again observed although have mildly increased conspicuity due to the increased sclerosis in the associated pathologic bony lesions. 2. Patchy opacities in the superior segment right lower lobe and to a lesser degree in the right upper lobe are increased with associated airway thickening, some of this appearance could be from radiation pneumonitis given proximity to the tumor. A component of lymphangitic spread of tumor is not completely excluded. 3. Other imaging findings of potential clinical significance: Coronary atherosclerosis. Circumferential distal esophageal wall thickening, the most common cause would be esophagitis. Considerable prostatomegaly. Suspected 4 mm in long axis left kidney lower pole renal calculus. 4. Emphysema and aortic atherosclerosis. Aortic Atherosclerosis (ICD10-I70.0) and Emphysema (ICD10-J43.9). Electronically Signed   By: Van Clines M.D.   On: 12/13/2019 06:12     ASSESSMENT:  1. 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 -4 cycles of carboplatin, pemetrexed and pembrolizumab from 07/05/2019 through 09/20/2019. -CT scan on 09/07/2019 showed improved lung lesion. L4 compression fracture from previous metastatic disease. -Bone scan was stable. -Maintenance pemetrexed and pembrolizumab started on 10/11/2019. -CT CAP on 12/09/2019 showed mixed response with significant reduction in size of the right upper lobe mass.  However right iliac crest lesion has increased in size with soft tissue mass and lytic destruction.  Most of further bone lesions appear similar in size and distribution.  2. Brain metastasis: -Presentation with right arm weakness with brain MRI on 04/28/2019  showing widespread metastatic disease in the brain, approximately 22 lesions. -WBRT completed on 05/19/2019. -MRI on 08/11/2019 showed interval decrease in size of multiple enhancing lesions scattered  throughout the brain parenchyma. Decrease in vasogenic edema. Brandon new lesions. -MRI of the brain on 11/12/2019 showed 24 mm enhancing transfalcine lesion has increased in size compared to MRI from 08/11/2019, previously 15 mm. Other brain lesions have remained stable or decreased in size. Edema also improved.  3. Back pain: -Completed XRT to the bone metastasis on 06/14/2019.   PLAN:  1. Metastatic lung adenocarcinoma: -He is being planned for XRT to the right pelvis from 01/10/2020 through 01/20/2020. -I have reviewed his labs today.  LFTs are adequate to proceed with his next treatment.  CBC shows normal white count and platelets. -We will see him back in 3 weeks for follow-up.  2. Brain metastasis: -Continue Keppra 500 mg twice daily.  MRI will be repeated in 3 months from 11/12/2019.  3. Back pain: -He reported some back pain.  I have refilled his tramadol 50 mg prescription.  4. Normocytic anemia: -Hemoglobin is down to 7.7 today.  We checked his ferritin today which was 906.  Percent saturation was 6.  Folic acid and B09 was normal. 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  We will check his CBC next week.  If drops below 7, will give 1 unit of blood transfusion.   Orders placed this encounter:  Brandon orders of the defined types were placed in this encounter.    Derek Jack, MD [facility] [phone]   I, Jacqualyn Posey, am acting as a scribe for Dr. Sanda Linger.  I, Derek Jack MD, have reviewed the above documentation for accuracy and completeness, and I agree with the above.

## 2020-01-04 NOTE — Progress Notes (Signed)
1036 Labs reviewed with and pt seen by [name] and pt approved for Alimta and Keytruda infusions as well as Vit B12 injection today per MD             Brandon Ferguson tolerated chemo infusions and Vit B12 injection well without complaints or incident. VSS upon discharge. Pt discharged self ambulatory using his cane in satisfactory condition

## 2020-01-04 NOTE — Progress Notes (Signed)
Patient has been assessed, vital signs and labs have been reviewed by Dr. Katragadda. ANC, Creatinine, LFTs, and Platelets are within treatment parameters per Dr. Katragadda. The patient is good to proceed with treatment at this time.  

## 2020-01-05 DIAGNOSIS — C7951 Secondary malignant neoplasm of bone: Secondary | ICD-10-CM | POA: Diagnosis not present

## 2020-01-09 ENCOUNTER — Ambulatory Visit
Admission: RE | Admit: 2020-01-09 | Discharge: 2020-01-09 | Disposition: A | Discharge status: Home / Self Care | Payer: Medicaid Other | Source: Ambulatory Visit | Attending: Radiation Oncology | Admitting: Radiation Oncology

## 2020-01-09 ENCOUNTER — Telehealth: Payer: Self-pay

## 2020-01-09 ENCOUNTER — Other Ambulatory Visit: Payer: Self-pay

## 2020-01-09 DIAGNOSIS — C7951 Secondary malignant neoplasm of bone: Secondary | ICD-10-CM | POA: Diagnosis not present

## 2020-01-09 DIAGNOSIS — C3411 Malignant neoplasm of upper lobe, right bronchus or lung: Secondary | ICD-10-CM | POA: Insufficient documentation

## 2020-01-10 ENCOUNTER — Other Ambulatory Visit: Payer: Self-pay

## 2020-01-10 ENCOUNTER — Ambulatory Visit
Admission: RE | Admit: 2020-01-10 | Discharge: 2020-01-10 | Disposition: A | Discharge status: Home / Self Care | Payer: Medicaid Other | Source: Ambulatory Visit | Attending: Radiation Oncology | Admitting: Radiation Oncology

## 2020-01-10 DIAGNOSIS — C7951 Secondary malignant neoplasm of bone: Secondary | ICD-10-CM | POA: Diagnosis not present

## 2020-01-11 ENCOUNTER — Ambulatory Visit
Admission: RE | Admit: 2020-01-11 | Discharge: 2020-01-11 | Disposition: A | Discharge status: Home / Self Care | Payer: Medicaid Other | Source: Ambulatory Visit | Attending: Radiation Oncology | Admitting: Radiation Oncology

## 2020-01-11 ENCOUNTER — Other Ambulatory Visit: Payer: Self-pay

## 2020-01-11 DIAGNOSIS — C7951 Secondary malignant neoplasm of bone: Secondary | ICD-10-CM | POA: Diagnosis not present

## 2020-01-12 ENCOUNTER — Ambulatory Visit
Admission: RE | Admit: 2020-01-12 | Discharge: 2020-01-12 | Disposition: A | Discharge status: Home / Self Care | Payer: Medicaid Other | Source: Ambulatory Visit | Attending: Radiation Oncology | Admitting: Radiation Oncology

## 2020-01-12 ENCOUNTER — Other Ambulatory Visit: Payer: Self-pay

## 2020-01-12 DIAGNOSIS — C7951 Secondary malignant neoplasm of bone: Secondary | ICD-10-CM | POA: Diagnosis not present

## 2020-01-13 ENCOUNTER — Ambulatory Visit: Payer: Medicaid Other

## 2020-01-13 ENCOUNTER — Encounter (HOSPITAL_COMMUNITY): Payer: Medicaid Other

## 2020-01-13 ENCOUNTER — Inpatient Hospital Stay (HOSPITAL_COMMUNITY): Payer: Medicaid Other | Attending: Hematology

## 2020-01-13 DIAGNOSIS — C7931 Secondary malignant neoplasm of brain: Secondary | ICD-10-CM | POA: Insufficient documentation

## 2020-01-13 DIAGNOSIS — D649 Anemia, unspecified: Secondary | ICD-10-CM | POA: Insufficient documentation

## 2020-01-13 DIAGNOSIS — C7951 Secondary malignant neoplasm of bone: Secondary | ICD-10-CM | POA: Insufficient documentation

## 2020-01-13 DIAGNOSIS — M549 Dorsalgia, unspecified: Secondary | ICD-10-CM | POA: Insufficient documentation

## 2020-01-13 DIAGNOSIS — Z5111 Encounter for antineoplastic chemotherapy: Secondary | ICD-10-CM | POA: Insufficient documentation

## 2020-01-13 DIAGNOSIS — Z5112 Encounter for antineoplastic immunotherapy: Secondary | ICD-10-CM | POA: Insufficient documentation

## 2020-01-13 DIAGNOSIS — C3411 Malignant neoplasm of upper lobe, right bronchus or lung: Secondary | ICD-10-CM | POA: Insufficient documentation

## 2020-01-16 ENCOUNTER — Ambulatory Visit
Admission: RE | Admit: 2020-01-16 | Discharge: 2020-01-16 | Disposition: A | Discharge status: Home / Self Care | Payer: Medicaid Other | Source: Ambulatory Visit | Attending: Radiation Oncology | Admitting: Radiation Oncology

## 2020-01-16 ENCOUNTER — Other Ambulatory Visit: Payer: Self-pay

## 2020-01-16 DIAGNOSIS — C7951 Secondary malignant neoplasm of bone: Secondary | ICD-10-CM | POA: Diagnosis not present

## 2020-01-17 ENCOUNTER — Ambulatory Visit
Admission: RE | Admit: 2020-01-17 | Discharge: 2020-01-17 | Disposition: A | Discharge status: Home / Self Care | Payer: Medicaid Other | Source: Ambulatory Visit | Attending: Radiation Oncology | Admitting: Radiation Oncology

## 2020-01-17 ENCOUNTER — Other Ambulatory Visit: Payer: Self-pay

## 2020-01-17 DIAGNOSIS — C7951 Secondary malignant neoplasm of bone: Secondary | ICD-10-CM | POA: Diagnosis not present

## 2020-01-18 ENCOUNTER — Other Ambulatory Visit: Payer: Self-pay

## 2020-01-18 ENCOUNTER — Ambulatory Visit
Admission: RE | Admit: 2020-01-18 | Discharge: 2020-01-18 | Disposition: A | Discharge status: Home / Self Care | Payer: Medicaid Other | Source: Ambulatory Visit | Attending: Radiation Oncology | Admitting: Radiation Oncology

## 2020-01-18 DIAGNOSIS — C7951 Secondary malignant neoplasm of bone: Secondary | ICD-10-CM | POA: Diagnosis not present

## 2020-01-19 ENCOUNTER — Ambulatory Visit: Payer: Medicaid Other

## 2020-01-20 ENCOUNTER — Ambulatory Visit
Admission: RE | Admit: 2020-01-20 | Discharge: 2020-01-20 | Disposition: A | Discharge status: Home / Self Care | Payer: Medicaid Other | Source: Ambulatory Visit | Attending: Radiation Oncology | Admitting: Radiation Oncology

## 2020-01-20 ENCOUNTER — Other Ambulatory Visit: Payer: Self-pay

## 2020-01-20 ENCOUNTER — Ambulatory Visit: Payer: Medicaid Other

## 2020-01-20 DIAGNOSIS — C7951 Secondary malignant neoplasm of bone: Secondary | ICD-10-CM | POA: Diagnosis not present

## 2020-01-23 ENCOUNTER — Ambulatory Visit
Admission: RE | Admit: 2020-01-23 | Discharge: 2020-01-23 | Disposition: A | Discharge status: Home / Self Care | Payer: Medicaid Other | Source: Ambulatory Visit | Attending: Radiation Oncology | Admitting: Radiation Oncology

## 2020-01-23 ENCOUNTER — Other Ambulatory Visit: Payer: Self-pay

## 2020-01-23 ENCOUNTER — Ambulatory Visit: Payer: Medicaid Other

## 2020-01-23 DIAGNOSIS — C7951 Secondary malignant neoplasm of bone: Secondary | ICD-10-CM | POA: Diagnosis not present

## 2020-01-24 ENCOUNTER — Ambulatory Visit
Admission: RE | Admit: 2020-01-24 | Discharge: 2020-01-24 | Disposition: A | Discharge status: Home / Self Care | Payer: Medicaid Other | Source: Ambulatory Visit | Attending: Radiation Oncology | Admitting: Radiation Oncology

## 2020-01-24 ENCOUNTER — Encounter: Payer: Self-pay | Admitting: Radiation Oncology

## 2020-01-24 ENCOUNTER — Other Ambulatory Visit: Payer: Self-pay

## 2020-01-24 DIAGNOSIS — C7951 Secondary malignant neoplasm of bone: Secondary | ICD-10-CM | POA: Diagnosis not present

## 2020-01-25 ENCOUNTER — Encounter (HOSPITAL_COMMUNITY): Payer: Self-pay | Admitting: Hematology

## 2020-01-25 ENCOUNTER — Inpatient Hospital Stay (HOSPITAL_COMMUNITY): Payer: Medicaid Other

## 2020-01-25 ENCOUNTER — Inpatient Hospital Stay (HOSPITAL_BASED_OUTPATIENT_CLINIC_OR_DEPARTMENT_OTHER): Payer: Medicaid Other | Admitting: Hematology

## 2020-01-25 ENCOUNTER — Other Ambulatory Visit: Payer: Self-pay

## 2020-01-25 VITALS — BP 127/79 | HR 70 | Temp 97.7°F | Resp 18

## 2020-01-25 VITALS — BP 110/70 | HR 95 | Temp 96.9°F | Resp 17 | Wt 127.0 lb

## 2020-01-25 DIAGNOSIS — C3411 Malignant neoplasm of upper lobe, right bronchus or lung: Secondary | ICD-10-CM

## 2020-01-25 DIAGNOSIS — C7951 Secondary malignant neoplasm of bone: Secondary | ICD-10-CM | POA: Diagnosis not present

## 2020-01-25 DIAGNOSIS — M549 Dorsalgia, unspecified: Secondary | ICD-10-CM | POA: Diagnosis not present

## 2020-01-25 DIAGNOSIS — Z95828 Presence of other vascular implants and grafts: Secondary | ICD-10-CM

## 2020-01-25 DIAGNOSIS — Z5112 Encounter for antineoplastic immunotherapy: Secondary | ICD-10-CM | POA: Diagnosis present

## 2020-01-25 DIAGNOSIS — Z5111 Encounter for antineoplastic chemotherapy: Secondary | ICD-10-CM | POA: Diagnosis present

## 2020-01-25 DIAGNOSIS — C7931 Secondary malignant neoplasm of brain: Secondary | ICD-10-CM | POA: Diagnosis not present

## 2020-01-25 DIAGNOSIS — D649 Anemia, unspecified: Secondary | ICD-10-CM | POA: Diagnosis not present

## 2020-01-25 LAB — COMPREHENSIVE METABOLIC PANEL
ALT: 16 U/L (ref 0–44)
AST: 21 U/L (ref 15–41)
Albumin: 3 g/dL — ABNORMAL LOW (ref 3.5–5.0)
Alkaline Phosphatase: 194 U/L — ABNORMAL HIGH (ref 38–126)
Anion gap: 10 (ref 5–15)
BUN: 9 mg/dL (ref 6–20)
CO2: 25 mmol/L (ref 22–32)
Calcium: 10 mg/dL (ref 8.9–10.3)
Chloride: 100 mmol/L (ref 98–111)
Creatinine, Ser: 0.45 mg/dL — ABNORMAL LOW (ref 0.61–1.24)
GFR calc Af Amer: >60 mL/min (ref >60)
GFR calc non Af Amer: >60 mL/min (ref >60)
Glucose, Bld: 134 mg/dL — ABNORMAL HIGH (ref 70–99)
Potassium: 3.7 mmol/L (ref 3.5–5.1)
Sodium: 135 mmol/L (ref 135–145)
Total Bilirubin: 0.3 mg/dL (ref 0.3–1.2)
Total Protein: 8.4 g/dL — ABNORMAL HIGH (ref 6.5–8.1)

## 2020-01-25 LAB — CBC WITH DIFFERENTIAL/PLATELET
Abs Immature Granulocytes: 0.05 10*3/uL (ref 0.00–0.07)
Basophils Absolute: 0 10*3/uL (ref 0.0–0.1)
Basophils Relative: 0 %
Eosinophils Absolute: 0.1 10*3/uL (ref 0.0–0.5)
Eosinophils Relative: 1 %
HCT: 22.8 % — ABNORMAL LOW (ref 39.0–52.0)
Hemoglobin: 6.7 g/dL — CL (ref 13.0–17.0)
Immature Granulocytes: 1 %
Lymphocytes Relative: 13 %
Lymphs Abs: 0.8 10*3/uL (ref 0.7–4.0)
MCH: 27.1 pg (ref 26.0–34.0)
MCHC: 29.4 g/dL — ABNORMAL LOW (ref 30.0–36.0)
MCV: 92.3 fL (ref 80.0–100.0)
Monocytes Absolute: 0.6 10*3/uL (ref 0.1–1.0)
Monocytes Relative: 10 %
Neutro Abs: 4.6 10*3/uL (ref 1.7–7.7)
Neutrophils Relative %: 75 %
Platelets: 363 10*3/uL (ref 150–400)
RBC: 2.47 MIL/uL — ABNORMAL LOW (ref 4.22–5.81)
RDW: 16.6 % — ABNORMAL HIGH (ref 11.5–15.5)
WBC: 6.2 10*3/uL (ref 4.0–10.5)
nRBC: 0 % (ref 0.0–0.2)

## 2020-01-25 LAB — ABO/RH: ABO/RH(D): A POS

## 2020-01-25 LAB — PREPARE RBC (CROSSMATCH)

## 2020-01-25 MED ORDER — SODIUM CHLORIDE 0.9 % IV SOLN
500.0000 mg/m2 | Freq: Once | INTRAVENOUS | Status: AC
  Administered 2020-01-25: 900 mg via INTRAVENOUS
  Filled 2020-01-25: qty 16

## 2020-01-25 MED ORDER — ACETAMINOPHEN 325 MG PO TABS
650.0000 mg | ORAL_TABLET | Freq: Once | ORAL | Status: AC
  Administered 2020-01-25: 650 mg via ORAL
  Filled 2020-01-25: qty 2

## 2020-01-25 MED ORDER — PROCHLORPERAZINE MALEATE 10 MG PO TABS
10.0000 mg | ORAL_TABLET | Freq: Once | ORAL | Status: AC
  Administered 2020-01-25: 10 mg via ORAL
  Filled 2020-01-25: qty 1

## 2020-01-25 MED ORDER — SODIUM CHLORIDE 0.9 % IV SOLN: Freq: Once | INTRAVENOUS | Status: AC

## 2020-01-25 MED ORDER — PEGFILGRASTIM-JMDB 6 MG/0.6ML ~~LOC~~ SOSY
PREFILLED_SYRINGE | SUBCUTANEOUS | Status: AC
  Filled 2020-01-25: qty 0.6

## 2020-01-25 MED ORDER — DIPHENHYDRAMINE HCL 25 MG PO CAPS
25.0000 mg | ORAL_CAPSULE | Freq: Once | ORAL | Status: AC
  Administered 2020-01-25: 25 mg via ORAL
  Filled 2020-01-25: qty 1

## 2020-01-25 MED ORDER — SODIUM CHLORIDE 0.9 % IV SOLN
200.0000 mg | Freq: Once | INTRAVENOUS | Status: AC
  Administered 2020-01-25: 200 mg via INTRAVENOUS
  Filled 2020-01-25: qty 8

## 2020-01-25 MED ORDER — CYANOCOBALAMIN 1000 MCG/ML IJ SOLN: 1000.0000 ug | Freq: Once | INTRAMUSCULAR | Status: DC

## 2020-01-25 MED ORDER — HEPARIN SOD (PORK) LOCK FLUSH 100 UNIT/ML IV SOLN
500.0000 [IU] | Freq: Once | INTRAVENOUS | Status: AC | PRN
  Administered 2020-01-25: 500 [IU]

## 2020-01-25 MED ORDER — SODIUM CHLORIDE 0.9% IV SOLUTION
250.0000 mL | Freq: Once | INTRAVENOUS | Status: AC
  Administered 2020-01-25: 250 mL via INTRAVENOUS

## 2020-01-25 MED ORDER — SODIUM CHLORIDE 0.9% FLUSH
10.0000 mL | INTRAVENOUS | Status: DC | PRN
  Administered 2020-01-25: 10 mL

## 2020-01-25 MED ORDER — SODIUM CHLORIDE 0.9% FLUSH: 10.0000 mL | INTRAVENOUS | Status: DC | PRN

## 2020-01-25 MED ORDER — HEPARIN SOD (PORK) LOCK FLUSH 100 UNIT/ML IV SOLN: 500.0000 [IU] | Freq: Every day | INTRAVENOUS | Status: DC | PRN

## 2020-01-25 NOTE — Progress Notes (Signed)
1120 Labs reviewed with and pt seen by [name] and pt approved for Alimta and Keytruda infusions today as well as 1 unit of PRBC's for Hgb 6.7 per MD                                                  Brandon Ferguson tolerated chemo tx and blood transfusion well without complaints or incident. VSS upon discharge. Pt discharged via wheelchair in satisfactory condition

## 2020-01-25 NOTE — Progress Notes (Signed)
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 Carboplatin, Keytruda and Alimta x 4 cycles from 07/05/2019 to 09/20/2019. 2. Radiation x 9 treatments from 01/10/2020 to 01/24/2020.  NGS Results: PD-L1 TPS 20%  CURRENT THERAPY: Maintenance Keytruda and pemetrexed  BRIEF ONCOLOGIC HISTORY:  Oncology History  Malignant neoplasm of right upper lobe of lung ([place])  05/02/2019 Initial Diagnosis   Malignant neoplasm of upper lobe of right lung ([place])   05/19/2019 Cancer Staging   Staging form: Lung, AJCC 8th Edition - Clinical stage from 05/19/2019: Stage IVB (cT2b, cN1, pM1c) - Signed by Derek Jack, MD on 05/19/2019   07/05/2019 -  Chemotherapy   The patient had palonosetron (ALOXI) injection 0.25 mg, 0.25 mg, Intravenous,  Once, 4 of 4 cycles Administration: 0.25 mg (07/05/2019), 0.25 mg (08/16/2019), 0.25 mg (09/20/2019), 0.25 mg (07/26/2019) PEMEtrexed (ALIMTA) 900 mg in sodium chloride 0.9 % 100 mL chemo infusion, 500 mg/m2 = 900 mg, Intravenous,  Once, 9 of 12 cycles Dose modification: 400 mg/m2 (80 % of original dose 500 mg/m2, Cycle 4, Reason: Provider Judgment), 500 mg/m2 (100 % of original dose 500 mg/m2, Cycle 5, Reason: Provider Judgment) Administration: 900 mg (07/05/2019), 900 mg (08/16/2019), 700 mg (09/20/2019), 900 mg (10/11/2019), 900 mg (11/01/2019), 900 mg (07/26/2019), 900 mg (11/22/2019), 900 mg (12/14/2019), 900 mg (01/04/2020) CARBOplatin (PARAPLATIN) 580 mg in sodium chloride 0.9 % 250 mL chemo infusion, 580 mg (100 % of original dose 581.5 mg), Intravenous,  Once, 4 of 4 cycles Dose modification:   (original dose 581.5 mg, Cycle 1),   (original dose 581.5 mg, Cycle 3),   (original dose 465.2 mg, Cycle 4, Reason: Provider Judgment),   (original dose 581.5 mg, Cycle  2) Administration: 580 mg (07/05/2019), 580 mg (08/16/2019), 470 mg (09/20/2019), 580 mg (07/26/2019) fosaprepitant (EMEND) 150 mg in sodium chloride 0.9 % 145 mL IVPB, 150 mg, Intravenous,  Once, 4 of 4 cycles Administration: 150 mg (07/05/2019), 150 mg (08/16/2019), 150 mg (09/20/2019), 150 mg (07/26/2019) pembrolizumab (KEYTRUDA) 200 mg in sodium chloride 0.9 % 50 mL chemo infusion, 200 mg, Intravenous, Once, 9 of 12 cycles Administration: 200 mg (07/05/2019), 200 mg (08/16/2019), 200 mg (09/20/2019), 200 mg (10/11/2019), 200 mg (11/01/2019), 200 mg (07/26/2019), 200 mg (11/22/2019), 200 mg (12/14/2019), 200 mg (01/04/2020)  for chemotherapy treatment.      CANCER STAGING: Cancer Staging Malignant neoplasm of right upper lobe of lung [facility]) Staging form: Lung, AJCC 8th Edition - Clinical stage from 05/19/2019: Stage IVB (cT2b, cN1, pM1c) - Signed by Derek Jack, MD on 05/19/2019   INTERVAL HISTORY:  Mr. Tulio Facundo, a 59 y.o. male, returns for routine follow-up and consideration for next cycle of chemotherapy. [place] was last seen on 01/04/2020.  Due for cycle #10 of Keytruda and pemetrexed today.   Overall, today he tells me he has been feeling okay. He received his last radiation treatment on 8/17; he received 9 cycles of radiation treatment. His pain has improved since doing radiation. He denies having diarrhea or SOB. His appetite is good and he eats every day.  He is able to do his chores. He will have his MRI brain on 9/10.  Overall, he feels ready for next cycle of chemo today.    REVIEW OF SYSTEMS:  Review of Systems  Constitutional: Positive  for fatigue (mild). Negative for appetite change.  Respiratory: Negative for shortness of breath.   Gastrointestinal: Negative for diarrhea.  All other systems reviewed and are negative.   PAST MEDICAL/SURGICAL HISTORY:  Past Medical History:  Diagnosis Date  . Cancer ([place])   . GERD (gastroesophageal reflux disease)   . Iron  deficiency   . Port-A-Cath in place 06/17/2019  . Vitamin D deficiency    Past Surgical History:  Procedure Laterality Date  . KNEE SURGERY    . PORTACATH PLACEMENT Left 06/29/2019   Procedure: INSERTION PORT-A-CATH;  Surgeon: Aviva Signs, MD;  Location: [facility];  Service: General;  Laterality: Left;  [name] Kitchen VIDEO BRONCHOSCOPY Bilateral 04/29/2019   Procedure: VIDEO BRONCHOSCOPY WITH FLUORO;  Surgeon: Laurin Coder, MD;  Location: [facility];  Service: Endoscopy;  Laterality: Bilateral;  . VIDEO BRONCHOSCOPY Bilateral 05/03/2019   Procedure: VIDEO BRONCHOSCOPY WITHOUT FLUORO;  Surgeon: Rigoberto Noel, MD;  Location: [facility];  Service: Cardiopulmonary;  Laterality: Bilateral;    SOCIAL HISTORY:  Social History   Socioeconomic History  . Marital status: Single    Spouse name: Not on file  . Number of children: Not on file  . Years of education: Not on file  . Highest education level: Not on file  Occupational History  . Not on file  Tobacco Use  . Smoking status: Current Every Day Smoker    Packs/day: 1.00    Types: Cigarettes  . Smokeless tobacco: Never Used  . Tobacco comment: quit cigs 5 months ago  Vaping Use  . Vaping Use: Never used  Substance and Sexual Activity  . Alcohol use: Yes    Alcohol/week: 6.0 standard drinks    Types: 6 Cans of beer per week    Comment: pt drinks 3 large beers daily. hasnt had any in 5 months  . Drug use: Yes    Types: Marijuana    Comment: daily. as of Jan 18,2021 hasnt had any in 5 months  . Sexual activity: Not on file  Other Topics Concern  . Not on file  Social History Narrative  . Not on file   Social Determinants of Health   Financial Resource Strain: Low Risk   . Difficulty of Paying Living Expenses: Not very hard  Food Insecurity: No Food Insecurity  . Worried About Charity fundraiser in the Last Year: Never true  . Ran Out of Food in the Last Year: Never true  Transportation Needs: No Transportation Needs  . Lack  of Transportation (Medical): No  . Lack of Transportation (Non-Medical): No  Physical Activity: Inactive  . Days of Exercise per Week: 0 days  . Minutes of Exercise per Session: 0 min  Stress: No Stress Concern Present  . Feeling of Stress : Only a little  Social Connections: Moderately Isolated  . Frequency of Communication with Friends and Family: More than three times a week  . Frequency of Social Gatherings with Friends and Family: More than three times a week  . Attends Religious Services: 1 to 4 times per year  . Active Member of Clubs or Organizations: No  . Attends Archivist Meetings: Never  . Marital Status: Never married  Intimate Partner Violence: Not At Risk  . Fear of Current or Ex-Partner: No  . Emotionally Abused: No  . Physically Abused: No  . Sexually Abused: No    FAMILY HISTORY:  Family History  Problem Relation Age of Onset  . Breast cancer Mother   .  Cirrhosis Father   . Alcohol abuse Father   . Hypertension Sister   . Cancer Brother   . Hypertension Sister   . Hypertension Sister   . Hypertension Sister   . Hypertension Sister     CURRENT MEDICATIONS:  Current Outpatient Medications  Medication Sig Dispense Refill  . acetaminophen (TYLENOL) 500 MG tablet Take 1,000 mg by mouth every 6 (six) hours as needed for moderate pain or headache.     . CARBOPLATIN IV Inject into the vein every 21 ( twenty-one) days. Every 3 weeks x 4 cycles    . ferrous sulfate 325 (65 FE) MG EC tablet Take 325 mg by mouth daily.    . folic acid (FOLVITE) 1 MG tablet Take 1 tablet (1 mg total) by mouth daily. 30 tablet 3  . levETIRAcetam (KEPPRA) 500 MG tablet Take 1 tablet (500 mg total) by mouth 2 (two) times daily. 60 tablet 3  . lidocaine-prilocaine (EMLA) cream Apply a small amount to port a cath site and cover with plastic wrap 1 hour prior to chemotherapy appointments 30 g 3  . magnesium oxide (MAG-OX) 400 (241.3 Mg) MG tablet Take 1 tablet (400 mg total) by  mouth 2 (two) times daily. 60 tablet 3  . PEMBROLIZUMAB IV Inject into the vein every 21 ( twenty-one) days.    [name] Kitchen PEMEtrexed 500 mg/m2 in sodium chloride 0.9 % 100 mL Inject 500 mg/m2 into the vein every 21 ( twenty-one) days. Every 3 weeks x 4 cycles    . polyethylene glycol (MIRALAX / GLYCOLAX) 17 g packet Take 17 g by mouth daily as needed. 14 each 0  . traMADol (ULTRAM) 50 MG tablet Take 1 tablet (50 mg total) by mouth at bedtime as needed. 30 tablet 0  . prochlorperazine (COMPAZINE) 10 MG tablet Take 1 tablet (10 mg total) by mouth every 6 (six) hours as needed (Nausea or vomiting). (Patient not taking: Reported on 01/04/2020) 30 tablet 1   No current facility-administered medications for this visit.    ALLERGIES:  No Known Allergies  PHYSICAL EXAM:  Performance status (ECOG): 1 - Symptomatic but completely ambulatory  Vitals:   01/25/20 0948  BP: 110/70  Pulse: 95  Resp: 17  Temp: (!) 96.9 F (36.1 C)  SpO2: 97%   Wt Readings from Last 3 Encounters:  01/25/20 127 lb (57.6 kg)  01/04/20 130 lb 9.6 oz (59.2 kg)  12/27/19 133 lb 9.6 oz (60.6 kg)   Physical Exam Vitals reviewed.  Constitutional:      Appearance: Normal appearance.  Cardiovascular:     Rate and Rhythm: Normal rate and regular rhythm.     Pulses: Normal pulses.     Heart sounds: Normal heart sounds.  Pulmonary:     Effort: Pulmonary effort is normal.     Breath sounds: Normal breath sounds.  Musculoskeletal:     Right lower leg: No edema.     Left lower leg: No edema.  Neurological:     General: No focal deficit present.     Mental Status: He is alert and oriented to person, place, and time.  Psychiatric:        Mood and Affect: Mood normal.        Behavior: Behavior normal.     LABORATORY DATA:  I have reviewed the labs as listed.  CBC Latest Ref Rng & Units 01/25/2020 01/04/2020 12/14/2019  WBC 4.0 - 10.5 K/uL 6.2 5.5 4.9  Hemoglobin 13.0 - 17.0 g/dL 6.7(LL) 7.7(L) 8.4(L)  Hematocrit 39 - 52 %  22.8(L) 25.7(L) 27.5(L)  Platelets 150 - 400 K/uL 363 307 320   CMP Latest Ref Rng & Units 01/25/2020 01/04/2020 12/14/2019  Glucose 70 - 99 mg/dL 134(H) 84 127(H)  BUN 6 - 20 mg/dL 9 11 5(L)  Creatinine 0.61 - 1.24 mg/dL 0.45(L) 0.49(L) 0.58(L)  Sodium 135 - 145 mmol/L 135 134(L) 137  Potassium 3.5 - 5.1 mmol/L 3.7 3.5 3.1(L)  Chloride 98 - 111 mmol/L 100 99 101  CO2 22 - 32 mmol/L $RemoveB'25 25 25  'zIVXgUZj$ Calcium 8.9 - 10.3 mg/dL 10.0 9.9 9.8  Total Protein 6.5 - 8.1 g/dL 8.4(H) 8.8(H) 8.3(H)  Total Bilirubin 0.3 - 1.2 mg/dL 0.3 0.4 0.4  Alkaline Phos 38 - 126 U/L 194(H) 157(H) 155(H)  AST 15 - 41 U/L $Remo'21 16 18  'CMlJE$ ALT 0 - 44 U/L $Remo'16 8 9   'IKqiN$ Lab Results  Component Value Date   TIBC 268 01/04/2020   TIBC 300 07/26/2019   FERRITIN 906 (H) 01/04/2020   FERRITIN 631 (H) 07/26/2019   IRONPCTSAT 6 (L) 01/04/2020   IRONPCTSAT 11 (L) 07/26/2019    DIAGNOSTIC IMAGING:  I have independently reviewed the scans and discussed with the patient. No results found.   ASSESSMENT:  1. 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 -4 cycles of carboplatin, pemetrexed and pembrolizumab from 07/05/2019 through 09/20/2019. -CT scan on 09/07/2019 showed improved lung lesion. L4 compression fracture from previous metastatic disease. -Bone scan was stable. -Maintenance pemetrexed and pembrolizumab started on 10/11/2019. -CT CAP on 12/09/2019 showed mixed response with significant reduction in size of the right upper lobe mass. However right iliac crest lesion has increased in size with soft tissue mass and lytic destruction. Most of further bone lesions appear similar in size and distribution. -XRT to the right pelvis from 01/10/2020 through  2. Brain metastasis: -Presentation with right arm weakness with brain MRI on 04/28/2019 showing widespread metastatic disease in the brain, approximately 22 lesions. -WBRT completed on 05/19/2019. -MRI on  08/11/2019 showed interval decrease in size of multiple enhancing lesions scattered throughout the brain parenchyma. Decrease in vasogenic edema. No new lesions. -MRI of the brain on 11/12/2019 showed 24 mm enhancing transfalcine lesion has increased in size compared to MRI from 08/11/2019, previously 15 mm. Other brain lesions have remained stable or decreased in size. Edema also improved.  3. Back pain: -Completed XRT to the bone metastasis on 06/14/2019.   PLAN:  1. Metastatic lung adenocarcinoma: -He is tolerating chemoimmunotherapy very well. -We reviewed his labs.  White count is 6.2 and platelets are 363.  LFTs are grossly normal. -He will proceed with pemetrexed and pembrolizumab today.  We will reevaluate him in 3 weeks.  2. Brain metastasis: -Continue Keppra 500 twice daily.  MRI will be repeated in 3 months from 11/12/2019.  3. Back pain: -Continue tramadol as needed for back pain.  He is not requiring on a daily basis.  4. Normocytic anemia: -Hemoglobin today 6.7.  Ferritin was 906 and percent saturation was 6.  B12 was normal. -We will give him 1 unit of PRBC today.   Orders placed this encounter:  No orders of the defined types were placed in this encounter.    Derek Jack, MD [place] [phone]   I, Milinda Antis, am acting as a scribe for Dr. Sanda Linger.  I, Derek Jack MD, have reviewed the above documentation for accuracy and completeness,  and I agree with the above.

## 2020-01-25 NOTE — Patient Instructions (Signed)
[facility] Discharge Instructions for Patients Receiving Chemotherapy   Beginning January 23rd 2017 lab work for the [facility] will be done in the  Main lab at [facility] on 1st floor. If you have a lab appointment with the [facility] please come in thru the  Main Entrance and check in at the main information desk   Today you received the following chemotherapy agents Keytruda and Alimta as well as 1 unit of blood. Follow-up as scheduled  To help prevent nausea and vomiting after your treatment, we encourage you to take your nausea medication   If you develop nausea and vomiting, or diarrhea that is not controlled by your medication, call the clinic.  The clinic phone number is [phone]. Office hours are Monday-Friday 8:30am-5:00pm.  BELOW ARE SYMPTOMS THAT SHOULD BE REPORTED IMMEDIATELY:  *FEVER GREATER THAN 101.0 F  *CHILLS WITH OR WITHOUT FEVER  NAUSEA AND VOMITING THAT IS NOT CONTROLLED WITH YOUR NAUSEA MEDICATION  *UNUSUAL SHORTNESS OF BREATH  *UNUSUAL BRUISING OR BLEEDING  TENDERNESS IN MOUTH AND THROAT WITH OR WITHOUT PRESENCE OF ULCERS  *URINARY PROBLEMS  *BOWEL PROBLEMS  UNUSUAL RASH Items with * indicate a potential emergency and should be followed up as soon as possible. If you have an emergency after office hours please contact your primary care physician or go to the nearest emergency department.  Please call the clinic during office hours if you have any questions or concerns.   You may also contact the Patient Navigator at [phone] should you have any questions or need assistance in obtaining follow up care.      Resources For Cancer Patients and their Caregivers ? American Cancer Society: Can assist with transportation, wigs, general needs, runs Look Good Feel Better.        364-585-8824 ? Cancer Care: Provides financial assistance, online support groups, medication/co-pay assistance.  1-800-813-HOPE  518-781-9360) ? [employer] Assists [place] cancer patients and their families through emotional , educational and financial support.  (947)493-1419 ? [place] DSS Where to apply for food stamps, Medicaid and utility assistance. 202 161 9755 ? [facility]: Transportation to medical appointments. (352)190-0396 ? Social Security Administration: May apply for disability if have a Stage IV cancer. (539)507-5667 (438) 840-1567 ? [employer], Disability and Transit Services: Assists with nutrition, care and transit needs. ([phone] 691-5470

## 2020-01-25 NOTE — Progress Notes (Signed)
Patient has been assessed by [name] and labs reviewed. Hemoglobin 6.7, orders received for one unit of blood.  Patient will also proceed with treatment today.  Pharmacy and primary RN aware.

## 2020-01-25 NOTE — Patient Instructions (Signed)
[facility] at [facility] Discharge Instructions  Labs drawn from portacath today   Thank you for choosing [facility] at [facility] to provide your oncology and hematology care.  To afford each patient quality time with our provider, please arrive at least 15 minutes before your scheduled appointment time.   If you have a lab appointment with the Cancer Center please come in thru the Main Entrance and check in at the main information desk.  You need to re-schedule your appointment should you arrive 10 or more minutes late.  We strive to give you quality time with our providers, and arriving late affects you and other patients whose appointments are after yours.  Also, if you no show three or more times for appointments you may be dismissed from the clinic at the providers discretion.     Again, thank you for choosing [facility].  Our hope is that these requests will decrease the amount of time that you wait before being seen by our physicians.       _____________________________________________________________  Should you have questions after your visit to [facility], please contact our office at [phone] and follow the prompts.  Our office hours are 8:00 a.m. and 4:30 p.m. Monday - Friday.  Please note that voicemails left after 4:00 p.m. may not be returned until the following business day.  We are closed weekends and major holidays.  You do have access to a nurse 24-7, just call the main number to the clinic [phone] and do not press any options, hold on the line and a nurse will answer the phone.    For prescription refill requests, have your pharmacy contact our office and allow 72 hours.    Due to Covid, you will need to wear a mask upon entering the hospital. If you do not have a mask, a mask will be given to you at the Main Entrance upon arrival. For doctor visits, patients may have 1 support person age 18  or older with them. For treatment visits, patients can not have anyone with them due to social distancing guidelines and our immunocompromised population.     

## 2020-01-26 LAB — TYPE AND SCREEN
ABO/RH(D): A POS
Antibody Screen: NEGATIVE
Unit division: 0

## 2020-01-26 LAB — BPAM RBC
Blood Product Expiration Date: 202108212359
ISSUE DATE / TIME: 202108181313
Unit Type and Rh: 600

## 2020-01-26 NOTE — Progress Notes (Signed)
CRITICAL VALUE ALERT  Critical Value:  hgb 6.7  Date & Time Notied:  01/25/2020 at 1010  Provider Notified: [name]  Orders Received/Actions taken: transfuse 1 unit PRBC

## 2020-02-01 ENCOUNTER — Emergency Department (HOSPITAL_COMMUNITY)
Admission: EM | Admit: 2020-02-01 | Discharge: 2020-02-01 | Disposition: A | Discharge status: Home / Self Care | Payer: Medicaid Other | Attending: Emergency Medicine | Admitting: Emergency Medicine

## 2020-02-01 ENCOUNTER — Emergency Department (HOSPITAL_COMMUNITY): Payer: Medicaid Other

## 2020-02-01 ENCOUNTER — Encounter (HOSPITAL_COMMUNITY): Payer: Self-pay | Admitting: *Deleted

## 2020-02-01 ENCOUNTER — Other Ambulatory Visit: Payer: Self-pay

## 2020-02-01 DIAGNOSIS — C3411 Malignant neoplasm of upper lobe, right bronchus or lung: Secondary | ICD-10-CM | POA: Insufficient documentation

## 2020-02-01 DIAGNOSIS — Z85118 Personal history of other malignant neoplasm of bronchus and lung: Secondary | ICD-10-CM | POA: Insufficient documentation

## 2020-02-01 DIAGNOSIS — Z79899 Other long term (current) drug therapy: Secondary | ICD-10-CM | POA: Diagnosis not present

## 2020-02-01 DIAGNOSIS — C7951 Secondary malignant neoplasm of bone: Secondary | ICD-10-CM | POA: Insufficient documentation

## 2020-02-01 DIAGNOSIS — R0781 Pleurodynia: Secondary | ICD-10-CM | POA: Diagnosis not present

## 2020-02-01 DIAGNOSIS — R1110 Vomiting, unspecified: Secondary | ICD-10-CM | POA: Insufficient documentation

## 2020-02-01 DIAGNOSIS — F17210 Nicotine dependence, cigarettes, uncomplicated: Secondary | ICD-10-CM | POA: Diagnosis not present

## 2020-02-01 LAB — COMPREHENSIVE METABOLIC PANEL
ALT: 16 U/L (ref 0–44)
AST: 22 U/L (ref 15–41)
Albumin: 3.1 g/dL — ABNORMAL LOW (ref 3.5–5.0)
Alkaline Phosphatase: 163 U/L — ABNORMAL HIGH (ref 38–126)
Anion gap: 13 (ref 5–15)
BUN: 11 mg/dL (ref 6–20)
CO2: 26 mmol/L (ref 22–32)
Calcium: 9.9 mg/dL (ref 8.9–10.3)
Chloride: 96 mmol/L — ABNORMAL LOW (ref 98–111)
Creatinine, Ser: 0.49 mg/dL — ABNORMAL LOW (ref 0.61–1.24)
GFR calc Af Amer: >60 mL/min (ref >60)
GFR calc non Af Amer: >60 mL/min (ref >60)
Glucose, Bld: 96 mg/dL (ref 70–99)
Potassium: 4.4 mmol/L (ref 3.5–5.1)
Sodium: 135 mmol/L (ref 135–145)
Total Bilirubin: 0.3 mg/dL (ref 0.3–1.2)
Total Protein: 8.8 g/dL — ABNORMAL HIGH (ref 6.5–8.1)

## 2020-02-01 LAB — URINALYSIS, ROUTINE W REFLEX MICROSCOPIC
Bilirubin Urine: NEGATIVE
Glucose, UA: NEGATIVE mg/dL
Hgb urine dipstick: NEGATIVE
Ketones, ur: NEGATIVE mg/dL
Leukocytes,Ua: NEGATIVE
Nitrite: NEGATIVE
Protein, ur: NEGATIVE mg/dL
Specific Gravity, Urine: 1.015 (ref 1.005–1.030)
pH: 5 (ref 5.0–8.0)

## 2020-02-01 LAB — CBC WITH DIFFERENTIAL/PLATELET
Abs Immature Granulocytes: 0.06 10*3/uL (ref 0.00–0.07)
Basophils Absolute: 0 10*3/uL (ref 0.0–0.1)
Basophils Relative: 0 %
Eosinophils Absolute: 0 10*3/uL (ref 0.0–0.5)
Eosinophils Relative: 1 %
HCT: 25.7 % — ABNORMAL LOW (ref 39.0–52.0)
Hemoglobin: 7.6 g/dL — ABNORMAL LOW (ref 13.0–17.0)
Immature Granulocytes: 1 %
Lymphocytes Relative: 17 %
Lymphs Abs: 0.8 10*3/uL (ref 0.7–4.0)
MCH: 27 pg (ref 26.0–34.0)
MCHC: 29.6 g/dL — ABNORMAL LOW (ref 30.0–36.0)
MCV: 91.1 fL (ref 80.0–100.0)
Monocytes Absolute: 1 10*3/uL (ref 0.1–1.0)
Monocytes Relative: 20 %
Neutro Abs: 2.9 10*3/uL (ref 1.7–7.7)
Neutrophils Relative %: 61 %
Platelets: 343 10*3/uL (ref 150–400)
RBC: 2.82 MIL/uL — ABNORMAL LOW (ref 4.22–5.81)
RDW: 17 % — ABNORMAL HIGH (ref 11.5–15.5)
WBC: 4.9 10*3/uL (ref 4.0–10.5)
nRBC: 0 % (ref 0.0–0.2)

## 2020-02-01 MED ORDER — GADOBUTROL 1 MMOL/ML IV SOLN
6.0000 mL | Freq: Once | INTRAVENOUS | Status: AC | PRN
  Administered 2020-02-01: 6 mL via INTRAVENOUS

## 2020-02-01 NOTE — ED Provider Notes (Signed)
[facility] EMERGENCY DEPARTMENT Provider Note   CSN: [id number] Arrival date & time: 02/01/20  1250     History Chief Complaint  Patient presents with  . Emesis    Brandon Ferguson is a 59 y.o. male.  HPI    Patient with significant medical history of adenocarcinoma of the right lung, acid reflux, iron deficiency, Port-A-Cath presents to the emergency department with chief complaint of right rib pain that started yesterday.  Patient states he has had this sharp pain in his right posterior rib, it is worse when he moves but states he feels fine when he is not moving.  Denies any recent trauma to the area, has not had no past history of rib pain in the past.  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  Patient is being managed by Dr.Katragadda was last seen at the office on 08/18 he is on Carboplatin Keytruda and Alimate 4 cycles and radiation 9 cycles.  He denies fever, chills, shortness of breath, chest pain, abdominal pain, nausea, vomiting, dysuria, pedal edema.  Past Medical History:  Diagnosis Date  . Cancer ([place])   . GERD (gastroesophageal reflux disease)   . Iron deficiency   . Port-A-Cath in place 06/17/2019  . Vitamin D deficiency     Patient Active Problem List   Diagnosis Date Noted  . Bone metastasis ([place]) 08/11/2019  . Port-A-Cath in place 06/17/2019  . Goals of care, counseling/discussion 06/16/2019  . Malignant neoplasm of right upper lobe of lung ([place])   . Lung mass 04/28/2019  . Metastatic cancer to brain ([place]) 04/27/2019  . Hypokalemia 04/27/2019    Past Surgical History:  Procedure Laterality Date  . KNEE SURGERY    . PORTACATH PLACEMENT Left 06/29/2019   Procedure: INSERTION PORT-A-CATH;  Surgeon: Aviva Signs, MD;  Location: [facility];  Service: General;  Laterality: Left;  [name] Kitchen VIDEO BRONCHOSCOPY Bilateral  04/29/2019   Procedure: VIDEO BRONCHOSCOPY WITH FLUORO;  Surgeon: Laurin Coder, MD;  Location: [facility] END[facility];  Service: Endoscopy;  Laterality: Bilateral;  . VIDEO BRONCHOSCOPY Bilateral 05/03/2019   Procedure: VIDEO BRONCHOSCOPY WITHOUT FLUORO;  Surgeon: Rigoberto Noel, MD;  Location: [facility];  Service: Cardiopulmonary;  Laterality: Bilateral;       Family History  Problem Relation Age of Onset  . Breast cancer Mother   . Cirrhosis Father   . Alcohol abuse Father   . Hypertension Sister   . Cancer Brother   . Hypertension Sister   . Hypertension Sister   . Hypertension Sister   . Hypertension Sister     Social History   Tobacco Use  . Smoking status: Current Every Day Smoker    Packs/day: 1.00    Types: Cigarettes  . Smokeless tobacco: Never Used  . Tobacco comment: quit cigs 5 months ago  Vaping Use  . Vaping Use: Never used  Substance Use Topics  . Alcohol use: Yes    Alcohol/week: 6.0 standard drinks    Types: 6 Cans of beer per week    Comment: pt drinks 3 large beers daily. hasnt had any in 5 months  . Drug use: Yes    Types: Marijuana    Comment: daily. as of Jan 18,2021 hasnt had any in 5 months    Home Medications Prior to Admission medications   Medication Sig Start Date End Date Taking?  Authorizing Provider  acetaminophen (TYLENOL) 500 MG tablet Take 1,000 mg by mouth every 6 (six) hours as needed for moderate pain or headache.    Yes [provider]  ferrous sulfate 325 (65 FE) MG EC tablet Take 325 mg by mouth daily.   Yes [provider]  folic acid (FOLVITE) 1 MG tablet Take 1 tablet (1 mg total) by mouth daily. 11/14/19  Yes Derek Jack, MD  levETIRAcetam (KEPPRA) 500 MG tablet Take 1 tablet (500 mg total) by mouth 2 (two) times daily. 11/14/19  Yes Vaslow, Acey Lav, MD  magnesium oxide (MAG-OX) 400 (241.3 Mg) MG tablet Take 1 tablet (400 mg total) by mouth 2 (two) times daily. 09/09/19  Yes Derek Jack, MD    polyethylene glycol (MIRALAX / GLYCOLAX) 17 g packet Take 17 g by mouth daily as needed. Patient taking differently: Take 17 g by mouth daily as needed for mild constipation.  05/04/19  Yes Shelly Coss, MD  prochlorperazine (COMPAZINE) 10 MG tablet Take 1 tablet (10 mg total) by mouth every 6 (six) hours as needed (Nausea or vomiting). 06/21/19  Yes Derek Jack, MD  traMADol (ULTRAM) 50 MG tablet Take 1 tablet (50 mg total) by mouth at bedtime as needed. Patient taking differently: Take 50 mg by mouth at bedtime as needed for moderate pain.  01/04/20  Yes Derek Jack, MD  CARBOPLATIN IV Inject into the vein every 21 ( twenty-one) days. Every 3 weeks x 4 cycles 07/05/19   [provider]  lidocaine-prilocaine (EMLA) cream Apply a small amount to port a cath site and cover with plastic wrap 1 hour prior to chemotherapy appointments 06/21/19   Derek Jack, MD  PEMBROLIZUMAB IV Inject into the vein every 21 ( twenty-one) days. 07/05/19   [provider]  PEMEtrexed 500 mg/m2 in sodium chloride 0.9 % 100 mL Inject 500 mg/m2 into the vein every 21 ( twenty-one) days. Every 3 weeks x 4 cycles 07/05/19   [provider]    Allergies    Patient has no known allergies.  Review of Systems   Review of Systems  Constitutional: Negative for chills and fever.  HENT: Negative for congestion, sore throat and tinnitus.   Eyes: Negative for visual disturbance.  Respiratory: Negative for cough and shortness of breath.   Cardiovascular: Negative for chest pain.  Gastrointestinal: Negative for abdominal pain, diarrhea, nausea and vomiting.  Genitourinary: Negative for dysuria, enuresis, flank pain, scrotal swelling and testicular pain.  Musculoskeletal: Negative for back pain.       Admits to right rib pain.  Skin: Negative for rash.  Neurological: Negative for dizziness.  Hematological: Does not bruise/bleed easily.    Physical Exam Updated Vital  Signs BP 123/74   Pulse 74   Temp 98.2 F (36.8 C) (Oral)   Resp 18   Ht 6' (1.829 m)   Wt 57.6 kg   SpO2 100%   BMI 17.22 kg/m   Physical Exam Vitals and nursing note reviewed.  Constitutional:      General: He is not in acute distress.    Appearance: He is not ill-appearing.  HENT:     Head: Normocephalic and atraumatic.     Nose: No congestion.     Mouth/Throat:     Mouth: Mucous membranes are moist.     Pharynx: Oropharynx is clear.  Eyes:     General: No scleral icterus. Cardiovascular:     Rate and Rhythm: Normal rate and regular rhythm.  Pulses: Normal pulses.     Heart sounds: No murmur heard.  No friction rub. No gallop.   Pulmonary:     Effort: No respiratory distress.     Breath sounds: No wheezing, rhonchi or rales.  Abdominal:     General: There is no distension.     Palpations: Abdomen is soft.     Tenderness: There is no abdominal tenderness. There is no right CVA tenderness, left CVA tenderness or guarding.  Musculoskeletal:        General: No swelling.     Right lower leg: No edema.     Left lower leg: No edema.     Comments: Back was visualized, no gross abnormalities noted along the patient's spine, nontender to palpation along patient's spine.  Slight tenderness along the right posterior sixth rib.  Skin:    General: Skin is warm and dry.     Capillary Refill: Capillary refill takes less than 2 seconds.     Findings: No rash.  Neurological:     General: No focal deficit present.     Mental Status: He is alert and oriented to person, place, and time.  Psychiatric:        Mood and Affect: Mood normal.     ED Results / Procedures / Treatments   Labs (all labs ordered are listed, but only abnormal results are displayed) Labs Reviewed  COMPREHENSIVE METABOLIC PANEL - Abnormal; Notable for the following components:      Result Value   Chloride 96 (*)    Creatinine, Ser 0.49 (*)    Total Protein 8.8 (*)    Albumin 3.1 (*)    Alkaline  Phosphatase 163 (*)    All other components within normal limits  CBC WITH DIFFERENTIAL/PLATELET - Abnormal; Notable for the following components:   RBC 2.82 (*)    Hemoglobin 7.6 (*)    HCT 25.7 (*)    MCHC 29.6 (*)    RDW 17.0 (*)    All other components within normal limits  URINALYSIS, ROUTINE W REFLEX MICROSCOPIC    EKG None  Radiology DG Ribs Unilateral W/Chest Right  Result Date: 02/01/2020 CLINICAL DATA:  Right rib pain, history of metastatic lung cancer EXAM: RIGHT RIBS AND CHEST - 3+ VIEW COMPARISON:  06/29/2019 FINDINGS: There is no right rib fracture identified. No suspicious osseous lesion. Left chest wall port catheter tip overlies SVC. Patchy right perihilar density, which could reflect radiation change or residual tumor. No pericardial effusion. No pneumothorax. Normal heart size. IMPRESSION: No right rib fracture or lesion identified. Patchy right perihilar density, which could reflect radiation change or residual tumor. Electronically Signed   By: Macy Mis M.D.   On: 02/01/2020 14:25   MR THORACIC SPINE W WO CONTRAST  Result Date: 02/01/2020 CLINICAL DATA:  Lung cancer patient with widespread osseous metastatic disease EXAM: MRI THORACIC AND LUMBAR SPINE WITHOUT AND WITH CONTRAST TECHNIQUE: Multiplanar and multiecho pulse sequences of the thoracic and lumbar spine were obtained without and with intravenous contrast. CONTRAST:  39m GADAVIST GADOBUTROL 1 MMOL/ML IV SOLN COMPARISON:  CT chest 12/09/2019.  Bone scan 09/14/2019. FINDINGS: MRI THORACIC SPINE FINDINGS Alignment:  No malalignment. Vertebrae: Widespread osseous metastatic disease throughout the region. Compared to the CT scan of July, there is a newly seen compression fracture at T1 with loss of height of 60% and posterior bowing of the posterior margin of the vertebral body. This narrows the spinal canal but does not appear to compress the spinal cord.  Otherwise, there are smaller foci of metastatic disease in  most of the thoracic vertebral levels, perhaps with sparing of T4, T5 and T11. There appears to be progressive tumor in the spinous process of T9. With mild encroachment upon the dorsal aspect of the spinal canal but no compression of the cord. Disease in the pedicle on the left at T9 results in some encroachment upon the intervertebral foramina on the left at T8-9 and T9-10. Cord:  No evidence of primary cord metastatic lesion. Paraspinal and other soft tissues: Multiple rib metastases, not primarily evaluated. Disc levels: No evidence of disc pathology. MRI LUMBAR SPINE FINDINGS Segmentation:  5 lumbar type vertebral bodies. Alignment:  Straightening of the normal lumbar lordosis. Vertebrae: Metastatic lesions affecting all levels throughout the lumbar region, with an old partial compression fracture at L4 similar to the previous CT. No evidence of extraosseous tumor extension or detectable progressive disease compared to the previous CT. Disease in the pedicle on the left at L1 could be developing early foraminal involvement on the left at T12-L1 and L1-2. Conus medullaris: Extends to the L1 level and appears normal. No evidence of metastatic disease to the distal cord or nerve roots of the region. Paraspinal and other soft tissues: Negative. Extensive pelvic metastatic disease is evident, particularly of the right iliac bone. Disc levels: Disc bulges at L3-4 and L4-5. At L4-5, there is moderate multifactorial stenosis because of posterior bowing of the posteroinferior aspect of the L4 vertebral body and bulging of the L4-5 disc. IMPRESSION: 1. Widespread osseous metastatic disease throughout the thoracic and lumbar spine. Newly seen compression fracture at T1 with loss of height of 60% and posterior bowing of the posterior margin of the vertebral body. This narrows the spinal canal but does not appear to grossly compress the spinal cord. 2. Progressive disease within the spinous process of T9. Tumor in the left  pedicle of T9 with early encroachment upon the foramina on the left at T8-9 and T9-10. 3. Widespread lumbar disease. Tumor in the left pedicle at L1 shows early foraminal extension at T12-L1 and L1-2. 4. Old compression deformity of L4 which has not changed since the previous CT. 5. Moderate multifactorial stenosis at the L4-5 disc level. Electronically Signed   By: Nelson Chimes M.D.   On: 02/01/2020 19:22   MR Lumbar Spine W Wo Contrast  Result Date: 02/01/2020 CLINICAL DATA:  Lung cancer patient with widespread osseous metastatic disease EXAM: MRI THORACIC AND LUMBAR SPINE WITHOUT AND WITH CONTRAST TECHNIQUE: Multiplanar and multiecho pulse sequences of the thoracic and lumbar spine were obtained without and with intravenous contrast. CONTRAST:  70m GADAVIST GADOBUTROL 1 MMOL/ML IV SOLN COMPARISON:  CT chest 12/09/2019.  Bone scan 09/14/2019. FINDINGS: MRI THORACIC SPINE FINDINGS Alignment:  No malalignment. Vertebrae: Widespread osseous metastatic disease throughout the region. Compared to the CT scan of July, there is a newly seen compression fracture at T1 with loss of height of 60% and posterior bowing of the posterior margin of the vertebral body. This narrows the spinal canal but does not appear to compress the spinal cord. Otherwise, there are smaller foci of metastatic disease in most of the thoracic vertebral levels, perhaps with sparing of T4, T5 and T11. There appears to be progressive tumor in the spinous process of T9. With mild encroachment upon the dorsal aspect of the spinal canal but no compression of the cord. Disease in the pedicle on the left at T9 results in some encroachment upon the intervertebral foramina on  the left at T8-9 and T9-10. Cord:  No evidence of primary cord metastatic lesion. Paraspinal and other soft tissues: Multiple rib metastases, not primarily evaluated. Disc levels: No evidence of disc pathology. MRI LUMBAR SPINE FINDINGS Segmentation:  5 lumbar type vertebral bodies.  Alignment:  Straightening of the normal lumbar lordosis. Vertebrae: Metastatic lesions affecting all levels throughout the lumbar region, with an old partial compression fracture at L4 similar to the previous CT. No evidence of extraosseous tumor extension or detectable progressive disease compared to the previous CT. Disease in the pedicle on the left at L1 could be developing early foraminal involvement on the left at T12-L1 and L1-2. Conus medullaris: Extends to the L1 level and appears normal. No evidence of metastatic disease to the distal cord or nerve roots of the region. Paraspinal and other soft tissues: Negative. Extensive pelvic metastatic disease is evident, particularly of the right iliac bone. Disc levels: Disc bulges at L3-4 and L4-5. At L4-5, there is moderate multifactorial stenosis because of posterior bowing of the posteroinferior aspect of the L4 vertebral body and bulging of the L4-5 disc. IMPRESSION: 1. Widespread osseous metastatic disease throughout the thoracic and lumbar spine. Newly seen compression fracture at T1 with loss of height of 60% and posterior bowing of the posterior margin of the vertebral body. This narrows the spinal canal but does not appear to grossly compress the spinal cord. 2. Progressive disease within the spinous process of T9. Tumor in the left pedicle of T9 with early encroachment upon the foramina on the left at T8-9 and T9-10. 3. Widespread lumbar disease. Tumor in the left pedicle at L1 shows early foraminal extension at T12-L1 and L1-2. 4. Old compression deformity of L4 which has not changed since the previous CT. 5. Moderate multifactorial stenosis at the L4-5 disc level. Electronically Signed   By: Nelson Chimes M.D.   On: 02/01/2020 19:22    Procedures Procedures (including critical care time)  Medications Ordered in ED Medications  gadobutrol (GADAVIST) 1 MMOL/ML injection 6 mL (6 mLs Intravenous Contrast Given 02/01/20 1842)    ED Course  I have  reviewed the triage vital signs and the nursing notes.  Pertinent labs & imaging results that were available during my care of the patient were reviewed by me and considered in my medical decision making (see chart for details).    MDM Rules/Calculators/A&P                          I have personally reviewed all imaging, labs and have interpreted them.  Patient presents with right rib pain that started yesterday.  Patient was alert and oriented did not appear in acute distress, vital signs within normal limits.  On exam patient's lung sounds were clear bilaterally, abdomen was nontender to palpation, patient had tender to palpation along the 6 posterior rib on the right side, no gross abnormalities noted.  No pedal edema noted.  Will order screening labs and x-ray for further evaluation.  Initial labs reveal CMP without severe electrolyte abnormalities, no signs of AKI, alk phos elevated at 163, appears to be at baseline for patient.  CBC shows normocytic anemia most likely secondary to chronic diseases appears to be around baseline for patient.  No leukocytosis noted.  Imaging of ribs and chest unremarkable.  Awaiting UA for further evaluation.  Will speak with patient's oncologist for further recommendations.  Spoke with Dr.Katratgadda who recommends if you suspect metastatic disease in spinal  cord should scan patient's thoracic spine as bone scan showed lesions present within the spine.  Will send patient for MRI of thoracic spine.  MRI of spine reveals metastatic disease in the lumbar and thoracic spine no infiltrate into spinal canal.  Spoke with Dr.Katratgadda about these findings and he feels patient can follow-up as outpatient.  I have low suspicion for systemic infection as patient is nontoxic-appearing, vital signs reassuring, no obvious source infection on exam, no leukocytosis seen on CBC.  Low suspicion for UTI or pyelonephritis as UA came back negative for nitrates or leukocytes, no CVA  tenderness noted.  Low suspicion for cardiac abnormality as patient denies chest pain or shortness of breath, no signs of hypoperfusion or fluid overload noted on exam.  Low suspicion for metabolic abnormality as CMP does not shows electrolyte abnormality, no signs of AKI.  Alk phos elevated at 161 appears to be at baseline for patient low suspicion for cystitis or hepatic disease as he had no right upper quadrant pain, not jaundice during exam.  Low suspicion for CVA or worsening cerebral metastasis as neuro exam was benign, no focal deficits noted.  Patient appears to be resting calmly in bed showing no acute signs disstress, vital signs have remained stable does not meet criteria to be admitted to hospital.  Likely patient's pain is result of metastatic disease will recommend further follow-up at his oncologist.  Patient discussed with attending who agrees assessment and plan.  Patient was given at home care as well as strict return precautions.  Patient verbalized that he understood and agreed to plan.   Final Clinical Impression(s) / ED Diagnoses Final diagnoses:  Rib pain on right side  Malignant neoplasm of upper lobe of right lung [facility])  Malignant neoplasm metastatic to bone [facility])    Rx / DC Orders ED Discharge Orders    None       Aron Baba 02/01/20 2026    Elnora Morrison, MD 02/02/20 [id number]

## 2020-02-01 NOTE — ED Notes (Signed)
Pt taken to MRI  

## 2020-02-01 NOTE — ED Notes (Signed)
Pt is resting comfortably eating his dinner tray.

## 2020-02-01 NOTE — ED Notes (Signed)
Patient transported to MRI 

## 2020-02-01 NOTE — Discharge Instructions (Signed)
You have been seen here for rib pain.  Lab work and imaging look reassuring.  Spoke with your oncologist he feels you can be followed up at his office.    I want you to follow-up with him if symptoms worsen.  I want you to come back to the emergency department  if you develop shortness of breath, chest pain, severe abdominal pain, uncontrolled nausea, vomiting, diarrhea as these symptoms require further evaluation management.

## 2020-02-01 NOTE — ED Triage Notes (Signed)
Pt states he is a cancer pt and he has been vomiting clear emesis and upper right back pain

## 2020-02-06 ENCOUNTER — Telehealth: Payer: Self-pay | Admitting: *Deleted

## 2020-02-06 NOTE — Telephone Encounter (Signed)
Transition Care Management Unsuccessful Follow-up Telephone Call  Date of discharge and from where:  [facility], 02/01/20  Attempts:  1st Attempt  Reason for unsuccessful TCM follow-up call:  Unable to leave message. Message states voice mail is not set up.  Lenor Coffin, RN, BSN, [place] Patient [place] [phone]

## 2020-02-07 ENCOUNTER — Other Ambulatory Visit: Payer: Self-pay | Admitting: Radiation Therapy

## 2020-02-09 ENCOUNTER — Telehealth: Payer: Self-pay | Admitting: *Deleted

## 2020-02-09 NOTE — Telephone Encounter (Signed)
Transition Care Management Unsuccessful Follow-up Telephone Call   Date of discharge and from: 8.25.21, [facility]  Attempts:  2nd Attempt  Reason for unsuccessful TCM follow-up call:  Unable to reach patient. Message states "voicemail is not set up".  Lenor Coffin, RN, BSN, [place] Patient [place] [phone]

## 2020-02-15 ENCOUNTER — Other Ambulatory Visit: Payer: Self-pay

## 2020-02-15 ENCOUNTER — Inpatient Hospital Stay (HOSPITAL_COMMUNITY): Payer: Medicaid Other | Attending: Hematology

## 2020-02-15 ENCOUNTER — Inpatient Hospital Stay (HOSPITAL_COMMUNITY): Payer: Medicaid Other

## 2020-02-15 ENCOUNTER — Inpatient Hospital Stay (HOSPITAL_BASED_OUTPATIENT_CLINIC_OR_DEPARTMENT_OTHER): Payer: Medicaid Other | Admitting: Hematology

## 2020-02-15 ENCOUNTER — Other Ambulatory Visit (HOSPITAL_COMMUNITY): Payer: Self-pay | Admitting: *Deleted

## 2020-02-15 VITALS — BP 126/85 | HR 100 | Temp 97.1°F | Resp 17 | Wt 125.5 lb

## 2020-02-15 VITALS — BP 114/71 | HR 87 | Temp 96.8°F | Resp 18

## 2020-02-15 DIAGNOSIS — C3411 Malignant neoplasm of upper lobe, right bronchus or lung: Secondary | ICD-10-CM

## 2020-02-15 DIAGNOSIS — C7951 Secondary malignant neoplasm of bone: Secondary | ICD-10-CM | POA: Diagnosis not present

## 2020-02-15 DIAGNOSIS — C7931 Secondary malignant neoplasm of brain: Secondary | ICD-10-CM | POA: Insufficient documentation

## 2020-02-15 DIAGNOSIS — D649 Anemia, unspecified: Secondary | ICD-10-CM | POA: Diagnosis not present

## 2020-02-15 DIAGNOSIS — Z5112 Encounter for antineoplastic immunotherapy: Secondary | ICD-10-CM | POA: Diagnosis not present

## 2020-02-15 DIAGNOSIS — Z5111 Encounter for antineoplastic chemotherapy: Secondary | ICD-10-CM | POA: Diagnosis present

## 2020-02-15 DIAGNOSIS — Z95828 Presence of other vascular implants and grafts: Secondary | ICD-10-CM

## 2020-02-15 LAB — COMPREHENSIVE METABOLIC PANEL
ALT: 10 U/L (ref 0–44)
AST: 19 U/L (ref 15–41)
Albumin: 2.9 g/dL — ABNORMAL LOW (ref 3.5–5.0)
Alkaline Phosphatase: 214 U/L — ABNORMAL HIGH (ref 38–126)
Anion gap: 13 (ref 5–15)
BUN: 9 mg/dL (ref 6–20)
CO2: 26 mmol/L (ref 22–32)
Calcium: 9.9 mg/dL (ref 8.9–10.3)
Chloride: 97 mmol/L — ABNORMAL LOW (ref 98–111)
Creatinine, Ser: 0.42 mg/dL — ABNORMAL LOW (ref 0.61–1.24)
GFR calc Af Amer: >60 mL/min (ref >60)
GFR calc non Af Amer: >60 mL/min (ref >60)
Glucose, Bld: 99 mg/dL (ref 70–99)
Potassium: 3.6 mmol/L (ref 3.5–5.1)
Sodium: 136 mmol/L (ref 135–145)
Total Bilirubin: 0.7 mg/dL (ref 0.3–1.2)
Total Protein: 8.5 g/dL — ABNORMAL HIGH (ref 6.5–8.1)

## 2020-02-15 LAB — CBC WITH DIFFERENTIAL/PLATELET
Abs Immature Granulocytes: 0.05 10*3/uL (ref 0.00–0.07)
Basophils Absolute: 0 10*3/uL (ref 0.0–0.1)
Basophils Relative: 0 %
Eosinophils Absolute: 0.1 10*3/uL (ref 0.0–0.5)
Eosinophils Relative: 1 %
HCT: 24.6 % — ABNORMAL LOW (ref 39.0–52.0)
Hemoglobin: 7.3 g/dL — ABNORMAL LOW (ref 13.0–17.0)
Immature Granulocytes: 1 %
Lymphocytes Relative: 10 %
Lymphs Abs: 0.8 10*3/uL (ref 0.7–4.0)
MCH: 26.3 pg (ref 26.0–34.0)
MCHC: 29.7 g/dL — ABNORMAL LOW (ref 30.0–36.0)
MCV: 88.5 fL (ref 80.0–100.0)
Monocytes Absolute: 0.9 10*3/uL (ref 0.1–1.0)
Monocytes Relative: 12 %
Neutro Abs: 5.8 10*3/uL (ref 1.7–7.7)
Neutrophils Relative %: 76 %
Platelets: 319 10*3/uL (ref 150–400)
RBC: 2.78 MIL/uL — ABNORMAL LOW (ref 4.22–5.81)
RDW: 17.3 % — ABNORMAL HIGH (ref 11.5–15.5)
WBC: 7.6 10*3/uL (ref 4.0–10.5)
nRBC: 0 % (ref 0.0–0.2)

## 2020-02-15 LAB — TSH: TSH: 1.576 u[IU]/mL (ref 0.350–4.500)

## 2020-02-15 MED ORDER — HEPARIN SOD (PORK) LOCK FLUSH 100 UNIT/ML IV SOLN
500.0000 [IU] | Freq: Once | INTRAVENOUS | Status: AC | PRN
  Administered 2020-02-15: 500 [IU]

## 2020-02-15 MED ORDER — SODIUM CHLORIDE 0.9 % IV SOLN
500.0000 mg/m2 | Freq: Once | INTRAVENOUS | Status: AC
  Administered 2020-02-15: 900 mg via INTRAVENOUS
  Filled 2020-02-15: qty 20

## 2020-02-15 MED ORDER — SODIUM CHLORIDE 0.9% FLUSH
10.0000 mL | INTRAVENOUS | Status: DC | PRN
  Administered 2020-02-15: 10 mL

## 2020-02-15 MED ORDER — PROCHLORPERAZINE MALEATE 10 MG PO TABS
10.0000 mg | ORAL_TABLET | Freq: Once | ORAL | Status: AC
  Administered 2020-02-15: 10 mg via ORAL
  Filled 2020-02-15: qty 1

## 2020-02-15 MED ORDER — TRAMADOL HCL 50 MG PO TABS
50.0000 mg | ORAL_TABLET | Freq: Two times a day (BID) | ORAL | 2 refills | Status: DC
Start: 2020-02-15 — End: 2020-02-22

## 2020-02-15 MED ORDER — SODIUM CHLORIDE 0.9 % IV SOLN: Freq: Once | INTRAVENOUS | Status: AC

## 2020-02-15 MED ORDER — SODIUM CHLORIDE 0.9 % IV SOLN
200.0000 mg | Freq: Once | INTRAVENOUS | Status: AC
  Administered 2020-02-15: 200 mg via INTRAVENOUS
  Filled 2020-02-15: qty 8

## 2020-02-15 NOTE — Patient Instructions (Signed)
[facility] at [facility] Discharge Instructions  Labs drawn from portacath today   Thank you for choosing [facility] at [facility] to provide your oncology and hematology care.  To afford each patient quality time with our provider, please arrive at least 15 minutes before your scheduled appointment time.   If you have a lab appointment with the Cancer Center please come in thru the Main Entrance and check in at the main information desk.  You need to re-schedule your appointment should you arrive 10 or more minutes late.  We strive to give you quality time with our providers, and arriving late affects you and other patients whose appointments are after yours.  Also, if you no show three or more times for appointments you may be dismissed from the clinic at the providers discretion.     Again, thank you for choosing [facility].  Our hope is that these requests will decrease the amount of time that you wait before being seen by our physicians.       _____________________________________________________________  Should you have questions after your visit to [facility], please contact our office at [phone] and follow the prompts.  Our office hours are 8:00 a.m. and 4:30 p.m. Monday - Friday.  Please note that voicemails left after 4:00 p.m. may not be returned until the following business day.  We are closed weekends and major holidays.  You do have access to a nurse 24-7, just call the main number to the clinic [phone] and do not press any options, hold on the line and a nurse will answer the phone.    For prescription refill requests, have your pharmacy contact our office and allow 72 hours.    Due to Covid, you will need to wear a mask upon entering the hospital. If you do not have a mask, a mask will be given to you at the Main Entrance upon arrival. For doctor visits, patients may have 1 support person age 18  or older with them. For treatment visits, patients can not have anyone with them due to social distancing guidelines and our immunocompromised population.     

## 2020-02-15 NOTE — Progress Notes (Signed)
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 Carboplatin, Keytruda and Alimta x 4 cycles from 07/05/2019 to 09/20/2019. 2. Radiation x 9 treatments from 01/10/2020 to 01/24/2020.  NGS Results: PD-L1 TPS 20%  CURRENT THERAPY: Maintenance Keytruda and pemetrexed  BRIEF ONCOLOGIC HISTORY:  Oncology History  Malignant neoplasm of right upper lobe of lung ([place])  05/02/2019 Initial Diagnosis   Malignant neoplasm of upper lobe of right lung ([place])   05/19/2019 Cancer Staging   Staging form: Lung, AJCC 8th Edition - Clinical stage from 05/19/2019: Stage IVB (cT2b, cN1, pM1c) - Signed by Brandon Jack, MD on 05/19/2019   07/05/2019 -  Chemotherapy   The patient had palonosetron (ALOXI) injection 0.25 mg, 0.25 mg, Intravenous,  Once, 4 of 4 cycles Administration: 0.25 mg (07/05/2019), 0.25 mg (08/16/2019), 0.25 mg (09/20/2019), 0.25 mg (07/26/2019) PEMEtrexed (ALIMTA) 900 mg in sodium chloride 0.9 % 100 mL chemo infusion, 500 mg/m2 = 900 mg, Intravenous,  Once, 10 of 12 cycles Dose modification: 400 mg/m2 (80 % of original dose 500 mg/m2, Cycle 4, Reason: Provider Judgment), 500 mg/m2 (100 % of original dose 500 mg/m2, Cycle 5, Reason: Provider Judgment) Administration: 900 mg (07/05/2019), 900 mg (08/16/2019), 700 mg (09/20/2019), 900 mg (10/11/2019), 900 mg (11/01/2019), 900 mg (07/26/2019), 900 mg (11/22/2019), 900 mg (12/14/2019), 900 mg (01/04/2020), 900 mg (01/25/2020) CARBOplatin (PARAPLATIN) 580 mg in sodium chloride 0.9 % 250 mL chemo infusion, 580 mg (100 % of original dose 581.5 mg), Intravenous,  Once, 4 of 4 cycles Dose modification:   (original dose 581.5 mg, Cycle 1),   (original dose 581.5 mg, Cycle 3),   (original dose 465.2 mg, Cycle 4, Reason: Provider Judgment),   (original dose 581.5 mg,  Cycle 2) Administration: 580 mg (07/05/2019), 580 mg (08/16/2019), 470 mg (09/20/2019), 580 mg (07/26/2019) fosaprepitant (EMEND) 150 mg in sodium chloride 0.9 % 145 mL IVPB, 150 mg, Intravenous,  Once, 4 of 4 cycles Administration: 150 mg (07/05/2019), 150 mg (08/16/2019), 150 mg (09/20/2019), 150 mg (07/26/2019) pembrolizumab (KEYTRUDA) 200 mg in sodium chloride 0.9 % 50 mL chemo infusion, 200 mg, Intravenous, Once, 10 of 12 cycles Administration: 200 mg (07/05/2019), 200 mg (08/16/2019), 200 mg (09/20/2019), 200 mg (10/11/2019), 200 mg (11/01/2019), 200 mg (07/26/2019), 200 mg (11/22/2019), 200 mg (12/14/2019), 200 mg (01/04/2020), 200 mg (01/25/2020)  for chemotherapy treatment.      CANCER STAGING: Cancer Staging Malignant neoplasm of right upper lobe of lung [facility]) Staging form: Lung, AJCC 8th Edition - Clinical stage from 05/19/2019: Stage IVB (cT2b, cN1, pM1c) - Signed by Brandon Jack, MD on 05/19/2019   INTERVAL HISTORY:  [name], a 59 y.o. male, returns for routine follow-up and consideration for next cycle of chemotherapy. [name] was last seen on 01/25/2020.  Due for cycle #11 of pembrolizumab and pemetrexed today.   Today he is accompanied by his wife. He complains of having severely left shoulder pain which prevents him from sitting down and prefers to lie down. He takes 1 tablet of tramadol daily and Tylenol as needed. Otherwise, he reports feeling okay. He denies having weakness in his legs, bowel or urinary incontinence, or diarrhea. His appetite is good, eating 2 meals a day, and supplementing 1 can of Boost daily.  He spends most of the day sitting, though he  is able to shower and dress himself.   Overall, he feels ready for next cycle of chemo today.    REVIEW OF SYSTEMS:  Review of Systems  Constitutional: Negative for appetite change and fatigue.  Gastrointestinal: Negative for diarrhea.  Genitourinary: Negative for bladder incontinence.   Musculoskeletal: Positive  for arthralgias (10/10 L shoulder pain).  Neurological: Negative for extremity weakness.  Psychiatric/Behavioral: Positive for sleep disturbance.  All other systems reviewed and are negative.   PAST MEDICAL/SURGICAL HISTORY:  Past Medical History:  Diagnosis Date   Cancer ([place])    GERD (gastroesophageal reflux disease)    Iron deficiency    Port-A-Cath in place 06/17/2019   Vitamin D deficiency    Past Surgical History:  Procedure Laterality Date   KNEE SURGERY     PORTACATH PLACEMENT Left 06/29/2019   Procedure: INSERTION PORT-A-CATH;  Surgeon: Brandon Signs, MD;  Location: [facility];  Service: General;  Laterality: Left;   VIDEO BRONCHOSCOPY Bilateral 04/29/2019   Procedure: VIDEO BRONCHOSCOPY WITH FLUORO;  Surgeon: Brandon Coder, MD;  Location: [facility];  Service: Endoscopy;  Laterality: Bilateral;   VIDEO BRONCHOSCOPY Bilateral 05/03/2019   Procedure: VIDEO BRONCHOSCOPY WITHOUT FLUORO;  Surgeon: Brandon Noel, MD;  Location: [facility];  Service: Cardiopulmonary;  Laterality: Bilateral;    SOCIAL HISTORY:  Social History   Socioeconomic History   Marital status: Single    Spouse name: Not on file   Number of children: Not on file   Years of education: Not on file   Highest education level: Not on file  Occupational History   Not on file  Tobacco Use   Smoking status: Current Every Day Smoker    Packs/day: 1.00    Types: Cigarettes   Smokeless tobacco: Never Used   Tobacco comment: quit cigs 5 months ago  Vaping Use   Vaping Use: Never used  Substance and Sexual Activity   Alcohol use: Yes    Alcohol/week: 6.0 standard drinks    Types: 6 Cans of beer per week    Comment: pt drinks 3 large beers daily. hasnt had any in 5 months   Drug use: Yes    Types: Marijuana    Comment: daily. as of Jan 18,2021 hasnt had any in 5 months   Sexual activity: Not on file  Other Topics Concern   Not on file  Social History Narrative   Not on file    Social Determinants of Health   Financial Resource Strain: Low Risk    Difficulty of Paying Living Expenses: Not very hard  Food Insecurity: No Food Insecurity   Worried About Running Out of Food in the Last Year: Never true   Ran Out of Food in the Last Year: Never true  Transportation Needs: No Transportation Needs   Lack of Transportation (Medical): No   Lack of Transportation (Non-Medical): No  Physical Activity: Inactive   Days of Exercise per Week: 0 days   Minutes of Exercise per Session: 0 min  Stress: No Stress Concern Present   Feeling of Stress : Only a little  Social Connections: Moderately Isolated   Frequency of Communication with Friends and Family: More than three times a week   Frequency of Social Gatherings with Friends and Family: More than three times a week   Attends Religious Services: 1 to 4 times per year   Active Member of [employer] or Organizations: No   Attends Archivist Meetings: Never   Marital Status: Never married  Intimate  Partner Violence: Not At Risk   Fear of Current or Ex-Partner: No   Emotionally Abused: No   Physically Abused: No   Sexually Abused: No    FAMILY HISTORY:  Family History  Problem Relation Age of Onset   Breast cancer Mother    Cirrhosis Father    Alcohol abuse Father    Hypertension Sister    Cancer Brother    Hypertension Sister    Hypertension Sister    Hypertension Sister    Hypertension Sister     CURRENT MEDICATIONS:  Current Outpatient Medications  Medication Sig Dispense Refill   acetaminophen (TYLENOL) 500 MG tablet Take 1,000 mg by mouth every 6 (six) hours as needed for moderate pain or headache.      CARBOPLATIN IV Inject into the vein every 21 ( twenty-one) days. Every 3 weeks x 4 cycles     ferrous sulfate 325 (65 FE) MG EC tablet Take 325 mg by mouth daily.     folic acid (FOLVITE) 1 MG tablet Take 1 tablet (1 mg total) by mouth daily. 30 tablet 3    levETIRAcetam (KEPPRA) 500 MG tablet Take 1 tablet (500 mg total) by mouth 2 (two) times daily. 60 tablet 3   magnesium oxide (MAG-OX) 400 (241.3 Mg) MG tablet Take 1 tablet (400 mg total) by mouth 2 (two) times daily. 60 tablet 3   PEMBROLIZUMAB IV Inject into the vein every 21 ( twenty-one) days.     PEMEtrexed 500 mg/m2 in sodium chloride 0.9 % 100 mL Inject 500 mg/m2 into the vein every 21 ( twenty-one) days. Every 3 weeks x 4 cycles     polyethylene glycol (MIRALAX / GLYCOLAX) 17 g packet Take 17 g by mouth daily as needed. (Patient taking differently: Take 17 g by mouth daily as needed for mild constipation. ) 14 each 0   traMADol (ULTRAM) 50 MG tablet Take 1 tablet (50 mg total) by mouth at bedtime as needed. (Patient taking differently: Take 50 mg by mouth at bedtime as needed for moderate pain. ) 30 tablet 0   lidocaine-prilocaine (EMLA) cream Apply a small amount to port a cath site and cover with plastic wrap 1 hour prior to chemotherapy appointments (Patient not taking: Reported on 02/15/2020) 30 g 3   prochlorperazine (COMPAZINE) 10 MG tablet Take 1 tablet (10 mg total) by mouth every 6 (six) hours as needed (Nausea or vomiting). (Patient not taking: Reported on 02/15/2020) 30 tablet 1   No current facility-administered medications for this visit.    ALLERGIES:  No Known Allergies  PHYSICAL EXAM:  Performance status (ECOG): 1 - Symptomatic but completely ambulatory  Vitals:   02/15/20 0850  BP: 126/85  Pulse: 100  Resp: 17  Temp: (!) 97.1 F (36.2 C)  SpO2: 97%   Wt Readings from Last 3 Encounters:  02/15/20 125 lb 8 oz (56.9 kg)  02/01/20 127 lb (57.6 kg)  01/25/20 127 lb (57.6 kg)   Physical Exam Vitals reviewed.  Constitutional:      Appearance: Normal appearance.  Cardiovascular:     Rate and Rhythm: Normal rate and regular rhythm.     Pulses: Normal pulses.     Heart sounds: Normal heart sounds.  Pulmonary:     Effort: Pulmonary effort is normal.      Breath sounds: Normal breath sounds.  Chest:     Comments: Port-a-Cath in L chest Musculoskeletal:     Thoracic back: Bony tenderness (T9 TTP) present.  Lumbar back: Bony tenderness (L1 TTP) present.  Neurological:     General: No focal deficit present.     Mental Status: He is alert and oriented to person, place, and time.  Psychiatric:        Mood and Affect: Mood normal.        Behavior: Behavior normal.     LABORATORY DATA:  I have reviewed the labs as listed.  CBC Latest Ref Rng & Units 02/15/2020 02/01/2020 01/25/2020  WBC 4.0 - 10.5 K/uL 7.6 4.9 6.2  Hemoglobin 13.0 - 17.0 g/dL 7.3(L) 7.6(L) 6.7(LL)  Hematocrit 39 - 52 % 24.6(L) 25.7(L) 22.8(L)  Platelets 150 - 400 K/uL 319 343 363   CMP Latest Ref Rng & Units 02/15/2020 02/01/2020 01/25/2020  Glucose 70 - 99 mg/dL 99 96 134(H)  BUN 6 - 20 mg/dL 9 11 9   Creatinine 0.61 - 1.24 mg/dL 0.42(L) 0.49(L) 0.45(L)  Sodium 135 - 145 mmol/L 136 135 135  Potassium 3.5 - 5.1 mmol/L 3.6 4.4 3.7  Chloride 98 - 111 mmol/L 97(L) 96(L) 100  CO2 22 - 32 mmol/L 26 26 25   Calcium 8.9 - 10.3 mg/dL 9.9 9.9 10.0  Total Protein 6.5 - 8.1 g/dL 8.5(H) 8.8(H) 8.4(H)  Total Bilirubin 0.3 - 1.2 mg/dL 0.7 0.3 0.3  Alkaline Phos 38 - 126 U/L 214(H) 163(H) 194(H)  AST 15 - 41 U/L 19 22 21   ALT 0 - 44 U/L 10 16 16     DIAGNOSTIC IMAGING:  I have independently reviewed the scans and discussed with the patient. DG Ribs Unilateral W/Chest Right  Result Date: 02/01/2020 CLINICAL DATA:  Right rib pain, history of metastatic lung cancer EXAM: RIGHT RIBS AND CHEST - 3+ VIEW COMPARISON:  06/29/2019 FINDINGS: There is no right rib fracture identified. No suspicious osseous lesion. Left chest wall port catheter tip overlies SVC. Patchy right perihilar density, which could reflect radiation change or residual tumor. No pericardial effusion. No pneumothorax. Normal heart size. IMPRESSION: No right rib fracture or lesion identified. Patchy right perihilar density,  which could reflect radiation change or residual tumor. Electronically Signed   By: Macy Mis M.D.   On: 02/01/2020 14:25   MR THORACIC SPINE W WO CONTRAST  Result Date: 02/01/2020 CLINICAL DATA:  Lung cancer patient with widespread osseous metastatic disease EXAM: MRI THORACIC AND LUMBAR SPINE WITHOUT AND WITH CONTRAST TECHNIQUE: Multiplanar and multiecho pulse sequences of the thoracic and lumbar spine were obtained without and with intravenous contrast. CONTRAST:  20m GADAVIST GADOBUTROL 1 MMOL/ML IV SOLN COMPARISON:  CT chest 12/09/2019.  Bone scan 09/14/2019. FINDINGS: MRI THORACIC SPINE FINDINGS Alignment:  No malalignment. Vertebrae: Widespread osseous metastatic disease throughout the region. Compared to the CT scan of July, there is a newly seen compression fracture at T1 with loss of height of 60% and posterior bowing of the posterior margin of the vertebral body. This narrows the spinal canal but does not appear to compress the spinal cord. Otherwise, there are smaller foci of metastatic disease in most of the thoracic vertebral levels, perhaps with sparing of T4, T5 and T11. There appears to be progressive tumor in the spinous process of T9. With mild encroachment upon the dorsal aspect of the spinal canal but no compression of the cord. Disease in the pedicle on the left at T9 results in some encroachment upon the intervertebral foramina on the left at T8-9 and T9-10. Cord:  No evidence of primary cord metastatic lesion. Paraspinal and other soft tissues: Multiple rib metastases, not  primarily evaluated. Disc levels: No evidence of disc pathology. MRI LUMBAR SPINE FINDINGS Segmentation:  5 lumbar type vertebral bodies. Alignment:  Straightening of the normal lumbar lordosis. Vertebrae: Metastatic lesions affecting all levels throughout the lumbar region, with an old partial compression fracture at L4 similar to the previous CT. No evidence of extraosseous tumor extension or detectable  progressive disease compared to the previous CT. Disease in the pedicle on the left at L1 could be developing early foraminal involvement on the left at T12-L1 and L1-2. Conus medullaris: Extends to the L1 level and appears normal. No evidence of metastatic disease to the distal cord or nerve roots of the region. Paraspinal and other soft tissues: Negative. Extensive pelvic metastatic disease is evident, particularly of the right iliac bone. Disc levels: Disc bulges at L3-4 and L4-5. At L4-5, there is moderate multifactorial stenosis because of posterior bowing of the posteroinferior aspect of the L4 vertebral body and bulging of the L4-5 disc. IMPRESSION: 1. Widespread osseous metastatic disease throughout the thoracic and lumbar spine. Newly seen compression fracture at T1 with loss of height of 60% and posterior bowing of the posterior margin of the vertebral body. This narrows the spinal canal but does not appear to grossly compress the spinal cord. 2. Progressive disease within the spinous process of T9. Tumor in the left pedicle of T9 with early encroachment upon the foramina on the left at T8-9 and T9-10. 3. Widespread lumbar disease. Tumor in the left pedicle at L1 shows early foraminal extension at T12-L1 and L1-2. 4. Old compression deformity of L4 which has not changed since the previous CT. 5. Moderate multifactorial stenosis at the L4-5 disc level. Electronically Signed   By: Nelson Chimes M.D.   On: 02/01/2020 19:22   MR Lumbar Spine W Wo Contrast  Result Date: 02/01/2020 CLINICAL DATA:  Lung cancer patient with widespread osseous metastatic disease EXAM: MRI THORACIC AND LUMBAR SPINE WITHOUT AND WITH CONTRAST TECHNIQUE: Multiplanar and multiecho pulse sequences of the thoracic and lumbar spine were obtained without and with intravenous contrast. CONTRAST:  85m GADAVIST GADOBUTROL 1 MMOL/ML IV SOLN COMPARISON:  CT chest 12/09/2019.  Bone scan 09/14/2019. FINDINGS: MRI THORACIC SPINE FINDINGS  Alignment:  No malalignment. Vertebrae: Widespread osseous metastatic disease throughout the region. Compared to the CT scan of July, there is a newly seen compression fracture at T1 with loss of height of 60% and posterior bowing of the posterior margin of the vertebral body. This narrows the spinal canal but does not appear to compress the spinal cord. Otherwise, there are smaller foci of metastatic disease in most of the thoracic vertebral levels, perhaps with sparing of T4, T5 and T11. There appears to be progressive tumor in the spinous process of T9. With mild encroachment upon the dorsal aspect of the spinal canal but no compression of the cord. Disease in the pedicle on the left at T9 results in some encroachment upon the intervertebral foramina on the left at T8-9 and T9-10. Cord:  No evidence of primary cord metastatic lesion. Paraspinal and other soft tissues: Multiple rib metastases, not primarily evaluated. Disc levels: No evidence of disc pathology. MRI LUMBAR SPINE FINDINGS Segmentation:  5 lumbar type vertebral bodies. Alignment:  Straightening of the normal lumbar lordosis. Vertebrae: Metastatic lesions affecting all levels throughout the lumbar region, with an old partial compression fracture at L4 similar to the previous CT. No evidence of extraosseous tumor extension or detectable progressive disease compared to the previous CT. Disease in the pedicle  on the left at L1 could be developing early foraminal involvement on the left at T12-L1 and L1-2. Conus medullaris: Extends to the L1 level and appears normal. No evidence of metastatic disease to the distal cord or nerve roots of the region. Paraspinal and other soft tissues: Negative. Extensive pelvic metastatic disease is evident, particularly of the right iliac bone. Disc levels: Disc bulges at L3-4 and L4-5. At L4-5, there is moderate multifactorial stenosis because of posterior bowing of the posteroinferior aspect of the L4 vertebral body and  bulging of the L4-5 disc. IMPRESSION: 1. Widespread osseous metastatic disease throughout the thoracic and lumbar spine. Newly seen compression fracture at T1 with loss of height of 60% and posterior bowing of the posterior margin of the vertebral body. This narrows the spinal canal but does not appear to grossly compress the spinal cord. 2. Progressive disease within the spinous process of T9. Tumor in the left pedicle of T9 with early encroachment upon the foramina on the left at T8-9 and T9-10. 3. Widespread lumbar disease. Tumor in the left pedicle at L1 shows early foraminal extension at T12-L1 and L1-2. 4. Old compression deformity of L4 which has not changed since the previous CT. 5. Moderate multifactorial stenosis at the L4-5 disc level. Electronically Signed   By: Nelson Chimes M.D.   On: 02/01/2020 19:22     ASSESSMENT:  1. 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 -4 cycles of carboplatin, pemetrexed and pembrolizumab from 07/05/2019 through 09/20/2019. -CT scan on 09/07/2019 showed improved lung lesion. L4 compression fracture from previous metastatic disease. -Bone scan was stable. -Maintenance pemetrexed and pembrolizumab started on 10/11/2019. -CT CAP on 12/09/2019 showed mixed response with significant reduction in size of the right upper lobe mass. However right iliac crest lesion has increased in size with soft tissue mass and lytic destruction. Most of further bone lesions appear similar in size and distribution. -XRT to the right pelvis started on 01/10/2020. -MRI of the thoracic and lumbar spine on 02/01/2020 showed new compression fracture at T1 with loss of height by 60%.  No cord compression.  Progressive disease of spinous process of T9.  Tumor in the left pedicle of T9 with early encroachment upon the foramina on the left [id number].  Tumor in the left pedicle at L1 shows a left foraminal extension.  Old  compression deformity of L4 not changed.  2. Brain metastasis: -Presentation with right arm weakness with brain MRI on 04/28/2019 showing widespread metastatic disease in the brain, approximately 22 lesions. -WBRT completed on 05/19/2019. -MRI on 08/11/2019 showed interval decrease in size of multiple enhancing lesions scattered throughout the brain parenchyma. Decrease in vasogenic edema. No new lesions. -MRI of the brain on 11/12/2019 showed 24 mm enhancing transfalcine lesion has increased in size compared to MRI from 08/11/2019, previously 15 mm. Other brain lesions have remained stable or decreased in size. Edema also improved.  3. Back pain: -Completed XRT to the bone metastasis on 06/14/2019.   PLAN:  1. Metastatic lung adenocarcinoma: -Reviewed MRI of thoracic and lumbar spine from 02/01/2020 done in ER.  He has some pain in the upper back above the shoulder blades. -I have recommended XRT to T1 and T9 and possibly other sites.  We will make a referral. -He does not have any progression outside the bones.  We talked about changing treatment versus continuing pemetrexed and pembrolizumab with radiation to the bone mets. -We will  continue with the same treatment at this time.  Reviewed his CBC and LFTs which are adequate for treatment today.  I plan to see him back in 3 weeks for follow-up.  2. Brain metastasis: -Continue Keppra 500 mg twice daily.  He has MRI of the brain next week.  3. Back pain: -He was told to increase tramadol as needed for back pain.  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  No transfusion needed.   Orders placed this encounter:  No orders of the defined types were placed in this encounter.    Brandon Jack, MD [place] [phone]   I, Milinda Antis, am acting as a scribe for Dr. Sanda Linger.  I, Brandon Jack MD, have reviewed the above documentation for accuracy and completeness, and I agree  with the above.

## 2020-02-15 NOTE — Patient Instructions (Signed)
[facility] at [facility] Discharge Instructions  You were seen today by [name]. He went over your recent results and scans. You received your treatment today. You will be seen in 2 weeks with the nurse practitioner for follow-up. [name] will see you back in 3 weeks for labs and follow up.   Thank you for choosing [facility] to provide your oncology and hematology care.  To afford each patient quality time with our provider, please arrive at least 15 minutes before your scheduled appointment time.   If you have a lab appointment with the [facility] please come in thru the Main Entrance and check in at the main information desk  You need to re-schedule your appointment should you arrive 10 or more minutes late.  We strive to give you quality time with our providers, and arriving late affects you and other patients whose appointments are after yours.  Also, if you no show three or more times for appointments you may be dismissed from the clinic at the providers discretion.     Again, thank you for choosing [facility].  Our hope is that these requests will decrease the amount of time that you wait before being seen by our physicians.       _____________________________________________________________  Should you have questions after your visit to [facility], please contact our office at [phone] between the hours of 8:00 a.m. and 4:30 p.m.  Voicemails left after 4:00 p.m. will not be returned until the following business day.  For prescription refill requests, have your pharmacy contact our office and allow 72 hours.    Cancer Center Support Programs:   > Cancer Support Group  2nd Tuesday of the month 1pm-2pm, Journey Room

## 2020-02-15 NOTE — Patient Instructions (Signed)
[facility] Discharge Instructions for Patients Receiving Chemotherapy   Beginning January 23rd 2017 lab work for the [facility] will be done in the  Main lab at [facility] on 1st floor. If you have a lab appointment with the [place] please come in thru the  Main Entrance and check in at the main information desk   Today you received the following chemotherapy agents Alimta and Keytruda. Follow-up as scheduled  To help prevent nausea and vomiting after your treatment, we encourage you to take your nausea medication   If you develop nausea and vomiting, or diarrhea that is not controlled by your medication, call the clinic.  The clinic phone number is [phone]. Office hours are Monday-Friday 8:30am-5:00pm.  BELOW ARE SYMPTOMS THAT SHOULD BE REPORTED IMMEDIATELY:  *FEVER GREATER THAN 101.0 F  *CHILLS WITH OR WITHOUT FEVER  NAUSEA AND VOMITING THAT IS NOT CONTROLLED WITH YOUR NAUSEA MEDICATION  *UNUSUAL SHORTNESS OF BREATH  *UNUSUAL BRUISING OR BLEEDING  TENDERNESS IN MOUTH AND THROAT WITH OR WITHOUT PRESENCE OF ULCERS  *URINARY PROBLEMS  *BOWEL PROBLEMS  UNUSUAL RASH Items with * indicate a potential emergency and should be followed up as soon as possible. If you have an emergency after office hours please contact your primary care physician or go to the nearest emergency department.  Please call the clinic during office hours if you have any questions or concerns.   You may also contact the Patient Navigator at [phone] should you have any questions or need assistance in obtaining follow up care.      Resources For Cancer Patients and their Caregivers ? American [employer] Society: Can assist with transportation, wigs, general needs, runs Look Good Feel Better.        669-639-1170 ? Cancer Care: Provides financial assistance, online support groups, medication/co-pay assistance.  1-800-813-HOPE 7327951789) ? [employer] [facility]ists [place] cancer patients and their families through emotional , educational and financial support.  (815)644-5237 ? [place] DSS Where to apply for food stamps, Medicaid and utility assistance. 4306752339 ? [employer]: Transportation to medical appointments. 9107871312 ? Social Security Administration: May apply for disability if have a Stage IV cancer. 340-636-7652 (631)099-6967 ? [employer], Disability and Transit Services: Assists with nutrition, care and transit needs. 580-082-9775

## 2020-02-15 NOTE — Progress Notes (Signed)
1031 Labs reviewed with and pt seen by [name] and pt approved for Alimta and Keytruda infusions today per MD             [name] tolerated Alimta and Keytruda infusions well without complaints or incident. VSS upon discharge. Pt discharged via wheelchair in satisfactory condition

## 2020-02-15 NOTE — Progress Notes (Signed)
Alk phosphatase is elevated 214 today, patient has known new bone metastatic lesions.  We are referring him to radiation for palliative treatment.  Patient will proceed with his treatment today.  Primary RN and pharmacy aware.

## 2020-02-17 ENCOUNTER — Ambulatory Visit (HOSPITAL_COMMUNITY)
Admission: RE | Admit: 2020-02-17 | Discharge: 2020-02-17 | Disposition: A | Discharge status: Home / Self Care | Payer: Medicaid Other | Source: Ambulatory Visit | Attending: Internal Medicine | Admitting: Internal Medicine

## 2020-02-17 DIAGNOSIS — C7931 Secondary malignant neoplasm of brain: Secondary | ICD-10-CM

## 2020-02-17 MED ORDER — GADOBUTROL 1 MMOL/ML IV SOLN
5.5000 mL | Freq: Once | INTRAVENOUS | Status: AC | PRN
  Administered 2020-02-17: 5.5 mL via INTRAVENOUS

## 2020-02-17 NOTE — Progress Notes (Addendum)
  Radiation Oncology         [phone] ________________________________  Name: Brandon Ferguson MRN: [id number]  Date: 01/24/2020  DOB: 11-Jan-1961  End of Treatment Note  Diagnosis:   Bone metastasis     Indication for treatment::  palliative       Radiation treatment dates:   01/09/20 - 01/24/20  Site/dose:   The patient was treated to a right iliac metastasis  to a dose of 30 Gray in 10 fractions using a 4 field 3D conformal technique.  Narrative: The patient tolerated radiation treatment relatively well.   No unexpected difficulties during the course of treatment.  Plan: The patient has completed radiation treatment. The patient will return to radiation oncology clinic for routine followup in one month. I advised the patient to call or return sooner if they have any questions or concerns related to their recovery or treatment. ________________________________  Jodelle Gross, M.D., Ph.D.

## 2020-02-20 ENCOUNTER — Inpatient Hospital Stay: Payer: Medicaid Other

## 2020-02-20 ENCOUNTER — Inpatient Hospital Stay: Payer: Medicaid Other | Attending: Radiation Oncology | Admitting: Internal Medicine

## 2020-02-20 ENCOUNTER — Other Ambulatory Visit: Payer: Self-pay

## 2020-02-20 ENCOUNTER — Telehealth: Payer: Self-pay | Admitting: Radiation Oncology

## 2020-02-20 VITALS — BP 118/82 | HR 104 | Temp 96.3°F | Resp 18 | Ht 72.0 in | Wt 120.4 lb

## 2020-02-20 DIAGNOSIS — F17210 Nicotine dependence, cigarettes, uncomplicated: Secondary | ICD-10-CM | POA: Insufficient documentation

## 2020-02-20 DIAGNOSIS — Z79899 Other long term (current) drug therapy: Secondary | ICD-10-CM | POA: Insufficient documentation

## 2020-02-20 DIAGNOSIS — Z803 Family history of malignant neoplasm of breast: Secondary | ICD-10-CM | POA: Insufficient documentation

## 2020-02-20 DIAGNOSIS — C7951 Secondary malignant neoplasm of bone: Secondary | ICD-10-CM | POA: Diagnosis not present

## 2020-02-20 DIAGNOSIS — Z9221 Personal history of antineoplastic chemotherapy: Secondary | ICD-10-CM | POA: Diagnosis not present

## 2020-02-20 DIAGNOSIS — C3411 Malignant neoplasm of upper lobe, right bronchus or lung: Secondary | ICD-10-CM | POA: Diagnosis not present

## 2020-02-20 DIAGNOSIS — G40909 Epilepsy, unspecified, not intractable, without status epilepticus: Secondary | ICD-10-CM | POA: Insufficient documentation

## 2020-02-20 DIAGNOSIS — C7931 Secondary malignant neoplasm of brain: Secondary | ICD-10-CM | POA: Insufficient documentation

## 2020-02-20 DIAGNOSIS — Z8249 Family history of ischemic heart disease and other diseases of the circulatory system: Secondary | ICD-10-CM | POA: Insufficient documentation

## 2020-02-20 DIAGNOSIS — Z923 Personal history of irradiation: Secondary | ICD-10-CM | POA: Diagnosis not present

## 2020-02-20 DIAGNOSIS — K219 Gastro-esophageal reflux disease without esophagitis: Secondary | ICD-10-CM | POA: Diagnosis not present

## 2020-02-20 NOTE — Progress Notes (Signed)
[place] at [place], [place] [address] [phone]   Interval Evaluation  Date of Service: 02/20/20 Patient Name: Brandon Ferguson Patient MRN: [id number] Patient DOB: Jun 12, 1960 Provider: Ventura Sellers, MD  Identifying Statement:  Brandon Ferguson is a 59 y.o. male with Metastatic cancer to brain [facility]) [C79.31]     Primary Cancer:  Oncologic History: Oncology History  Malignant neoplasm of right upper lobe of lung ([place])  05/02/2019 Initial Diagnosis   Malignant neoplasm of upper lobe of right lung ([place])   05/19/2019 Cancer Staging   Staging form: Lung, AJCC 8th Edition - Clinical stage from 05/19/2019: Stage IVB (cT2b, cN1, pM1c) - Signed by Derek Jack, MD on 05/19/2019   07/05/2019 -  Chemotherapy   The patient had palonosetron (ALOXI) injection 0.25 mg, 0.25 mg, Intravenous,  Once, 4 of 4 cycles Administration: 0.25 mg (07/05/2019), 0.25 mg (08/16/2019), 0.25 mg (09/20/2019), 0.25 mg (07/26/2019) PEMEtrexed (ALIMTA) 900 mg in sodium chloride 0.9 % 100 mL chemo infusion, 500 mg/m2 = 900 mg, Intravenous,  Once, 11 of 12 cycles Dose modification: 400 mg/m2 (80 % of original dose 500 mg/m2, Cycle 4, Reason: Provider Judgment), 500 mg/m2 (100 % of original dose 500 mg/m2, Cycle 5, Reason: Provider Judgment) Administration: 900 mg (07/05/2019), 900 mg (08/16/2019), 700 mg (09/20/2019), 900 mg (10/11/2019), 900 mg (11/01/2019), 900 mg (07/26/2019), 900 mg (11/22/2019), 900 mg (12/14/2019), 900 mg (01/04/2020), 900 mg (01/25/2020), 900 mg (02/15/2020) CARBOplatin (PARAPLATIN) 580 mg in sodium chloride 0.9 % 250 mL chemo infusion, 580 mg (100 % of original dose 581.5 mg), Intravenous,  Once, 4 of 4 cycles Dose modification:   (original dose 581.5 mg, Cycle 1),   (original dose 581.5 mg, Cycle 3),   (original dose 465.2 mg, Cycle 4, Reason: Provider Judgment),   (original dose 581.5 mg, Cycle 2) Administration: 580 mg (07/05/2019), 580 mg  (08/16/2019), 470 mg (09/20/2019), 580 mg (07/26/2019) fosaprepitant (EMEND) 150 mg in sodium chloride 0.9 % 145 mL IVPB, 150 mg, Intravenous,  Once, 4 of 4 cycles Administration: 150 mg (07/05/2019), 150 mg (08/16/2019), 150 mg (09/20/2019), 150 mg (07/26/2019) pembrolizumab (KEYTRUDA) 200 mg in sodium chloride 0.9 % 50 mL chemo infusion, 200 mg, Intravenous, Once, 11 of 12 cycles Administration: 200 mg (07/05/2019), 200 mg (08/16/2019), 200 mg (09/20/2019), 200 mg (10/11/2019), 200 mg (11/01/2019), 200 mg (07/26/2019), 200 mg (11/22/2019), 200 mg (12/14/2019), 200 mg (01/04/2020), 200 mg (01/25/2020), 200 mg (02/15/2020)  for chemotherapy treatment.     CNS Oncologic History: 06/14/19: Completes WBRT with Dr. Lisbeth Renshaw  Interval History:  [name] presents for follow up after recent MRI brain.  No new or progressive neurologic deficits today.  Does describe one episode of left hand shaking, trembling.  Only dosing Keppra once per day in the morning, not in evening.  Although he remains mostly independent, family will be arranging more care for him.  Tolerating chemotherapy well, alimta and keytruda with [name].  Continues to ambulate with a cane around the home.  H+P (08/15/19) Patient presents to clinic for follow up after recent brain MRI study.  He completed his radiation ~2 months ago without complication.  He has not experienced any seizures since his initial event (right sided shaking for 8 minutes) several months ago.  Continues on Keppra twice per day.  Lives mostly alone, able to mostly manage finances but needs some help with that and his medications.  No longer working since cancer diagnosis.  Gets tired and  dizzy on chemotherapy days and shortly thereafter.   Medications: Current Outpatient Medications on File Prior to Visit  Medication Sig Dispense Refill  . acetaminophen (TYLENOL) 500 MG tablet Take 1,000 mg by mouth every 6 (six) hours as needed for moderate pain or headache.     . CARBOPLATIN  IV Inject into the vein every 21 ( twenty-one) days. Every 3 weeks x 4 cycles    . ferrous sulfate 325 (65 FE) MG EC tablet Take 325 mg by mouth daily.    . folic acid (FOLVITE) 1 MG tablet Take 1 tablet (1 mg total) by mouth daily. 30 tablet 3  . levETIRAcetam (KEPPRA) 500 MG tablet Take 1 tablet (500 mg total) by mouth 2 (two) times daily. 60 tablet 3  . lidocaine-prilocaine (EMLA) cream Apply a small amount to port a cath site and cover with plastic wrap 1 hour prior to chemotherapy appointments (Patient not taking: Reported on 02/15/2020) 30 g 3  . magnesium oxide (MAG-OX) 400 (241.3 Mg) MG tablet Take 1 tablet (400 mg total) by mouth 2 (two) times daily. 60 tablet 3  . PEMBROLIZUMAB IV Inject into the vein every 21 ( twenty-one) days.    [name] Kitchen PEMEtrexed 500 mg/m2 in sodium chloride 0.9 % 100 mL Inject 500 mg/m2 into the vein every 21 ( twenty-one) days. Every 3 weeks x 4 cycles    . polyethylene glycol (MIRALAX / GLYCOLAX) 17 g packet Take 17 g by mouth daily as needed. (Patient not taking: Reported on 02/20/2020) 14 each 0  . prochlorperazine (COMPAZINE) 10 MG tablet Take 1 tablet (10 mg total) by mouth every 6 (six) hours as needed (Nausea or vomiting). (Patient not taking: Reported on 02/15/2020) 30 tablet 1  . traMADol (ULTRAM) 50 MG tablet Take 1 tablet (50 mg total) by mouth 2 (two) times daily. 60 tablet 2   No current facility-administered medications on file prior to visit.    Allergies: No Known Allergies Past Medical History:  Past Medical History:  Diagnosis Date  . Cancer ([place])   . GERD (gastroesophageal reflux disease)   . Iron deficiency   . Port-A-Cath in place 06/17/2019  . Vitamin D deficiency    Past Surgical History:  Past Surgical History:  Procedure Laterality Date  . KNEE SURGERY    . PORTACATH PLACEMENT Left 06/29/2019   Procedure: INSERTION PORT-A-CATH;  Surgeon: Aviva Signs, MD;  Location: [facility];  Service: General;  Laterality: Left;  [name] Kitchen VIDEO BRONCHOSCOPY  Bilateral 04/29/2019   Procedure: VIDEO BRONCHOSCOPY WITH FLUORO;  Surgeon: Laurin Coder, MD;  Location: [facility];  Service: Endoscopy;  Laterality: Bilateral;  . VIDEO BRONCHOSCOPY Bilateral 05/03/2019   Procedure: VIDEO BRONCHOSCOPY WITHOUT FLUORO;  Surgeon: Rigoberto Noel, MD;  Location: [facility];  Service: Cardiopulmonary;  Laterality: Bilateral;   Social History:  Social History   Socioeconomic History  . Marital status: Single    Spouse name: Not on file  . Number of children: Not on file  . Years of education: Not on file  . Highest education level: Not on file  Occupational History  . Not on file  Tobacco Use  . Smoking status: Current Every Day Smoker    Packs/day: 1.00    Types: Cigarettes  . Smokeless tobacco: Never Used  . Tobacco comment: quit cigs 5 months ago  Vaping Use  . Vaping Use: Never used  Substance and Sexual Activity  . Alcohol use: Yes    Alcohol/week: 6.0 standard drinks  Types: 6 Cans of beer per week    Comment: pt drinks 3 large beers daily. hasnt had any in 5 months  . Drug use: Yes    Types: Marijuana    Comment: daily. as of Jan 18,2021 hasnt had any in 5 months  . Sexual activity: Not on file  Other Topics Concern  . Not on file  Social History Narrative  . Not on file   Social Determinants of Health   Financial Resource Strain: Low Risk   . Difficulty of Paying Living Expenses: Not very hard  Food Insecurity: No Food Insecurity  . Worried About Charity fundraiser in the Last Year: Never true  . Ran Out of Food in the Last Year: Never true  Transportation Needs: No Transportation Needs  . Lack of Transportation (Medical): No  . Lack of Transportation (Non-Medical): No  Physical Activity: Inactive  . Days of Exercise per Week: 0 days  . Minutes of Exercise per Session: 0 min  Stress: No Stress Concern Present  . Feeling of Stress : Only a little  Social Connections: Moderately Isolated  . Frequency of  Communication with Friends and Family: More than three times a week  . Frequency of Social Gatherings with Friends and Family: More than three times a week  . Attends Religious Services: 1 to 4 times per year  . Active Member of Clubs or Organizations: No  . Attends Archivist Meetings: Never  . Marital Status: Never married  Intimate Partner Violence: Not At Risk  . Fear of Current or Ex-Partner: No  . Emotionally Abused: No  . Physically Abused: No  . Sexually Abused: No   Family History:  Family History  Problem Relation Age of Onset  . Breast cancer Mother   . Cirrhosis Father   . Alcohol abuse Father   . Hypertension Sister   . Cancer Brother   . Hypertension Sister   . Hypertension Sister   . Hypertension Sister   . Hypertension Sister     Review of Systems: Constitutional: Doesn't report fevers, chills or abnormal weight loss Eyes: Doesn't report blurriness of vision Ears, nose, mouth, throat, and face: Doesn't report sore throat Respiratory: Doesn't report cough, dyspnea or wheezes Cardiovascular: Doesn't report palpitation, chest discomfort  Gastrointestinal:  Doesn't report nausea, constipation, diarrhea GU: Doesn't report incontinence Skin: Doesn't report skin rashes Neurological: Per HPI Musculoskeletal: Doesn't report joint pain Behavioral/Psych: Doesn't report anxiety  Physical Exam: Vitals:   02/20/20 1100  BP: 118/82  Pulse: (!) 104  Resp: 18  Temp: (!) 96.3 F (35.7 C)  SpO2: 100%   KPS: 80. General: Alert, cooperative, pleasant, in no acute distress Head: Normal EENT: No conjunctival injection or scleral icterus.  Lungs: Resp effort normal Cardiac: Regular rate Abdomen: Non-distended abdomen Skin: No rashes cyanosis or petechiae. Extremities: No clubbing or edema  Neurologic Exam: Mental Status: Awake, alert, attentive to examiner. Oriented to self and environment. Language is fluent with intact comprehension. Age advanced  pyschomotor slowing Cranial Nerves: Visual acuity is grossly normal. Visual fields are full. Extra-ocular movements intact. No ptosis. Face is symmetric Motor: Tone and bulk are normal. Power is full in both arms and legs. Reflexes are symmetric, no pathologic reflexes present.  Sensory: Intact to light touch Gait: Cane assisted  Labs: I have reviewed the data as listed    Component Value Date/Time   NA 136 02/15/2020 0845   K 3.6 02/15/2020 0845   CL 97 (L) 02/15/2020 0845  CO2 26 02/15/2020 0845   GLUCOSE 99 02/15/2020 0845   BUN 9 02/15/2020 0845   CREATININE 0.42 (L) 02/15/2020 0845   CALCIUM 9.9 02/15/2020 0845   PROT 8.5 (H) 02/15/2020 0845   ALBUMIN 2.9 (L) 02/15/2020 0845   AST 19 02/15/2020 0845   ALT 10 02/15/2020 0845   ALKPHOS 214 (H) 02/15/2020 0845   BILITOT 0.7 02/15/2020 0845   GFRNONAA >60 02/15/2020 0845   GFRAA >60 02/15/2020 0845   Lab Results  Component Value Date   WBC 7.6 02/15/2020   NEUTROABS 5.8 02/15/2020   HGB 7.3 (L) 02/15/2020   HCT 24.6 (L) 02/15/2020   MCV 88.5 02/15/2020   PLT 319 02/15/2020    Imaging:  DG Ribs Unilateral W/Chest Right  Result Date: 02/01/2020 CLINICAL DATA:  Right rib pain, history of metastatic lung cancer EXAM: RIGHT RIBS AND CHEST - 3+ VIEW COMPARISON:  06/29/2019 FINDINGS: There is no right rib fracture identified. No suspicious osseous lesion. Left chest wall port catheter tip overlies SVC. Patchy right perihilar density, which could reflect radiation change or residual tumor. No pericardial effusion. No pneumothorax. Normal heart size. IMPRESSION: No right rib fracture or lesion identified. Patchy right perihilar density, which could reflect radiation change or residual tumor. Electronically Signed   By: Macy Mis M.D.   On: 02/01/2020 14:25   MR BRAIN W WO CONTRAST  Addendum Date: 02/20/2020   ADDENDUM REPORT: 02/20/2020 08:19 ADDENDUM: An additional new lesion is seen in the left occipital white matter, 2  mm in diameter as measured on 9:74. Electronically Signed   By: Monte Fantasia M.D.   On: 02/20/2020 08:19   Result Date: 02/20/2020 CLINICAL DATA:  Metastatic adenocarcinoma of the lung. Assess treatment. EXAM: MRI HEAD WITHOUT AND WITH CONTRAST TECHNIQUE: Multiplanar, multiecho pulse sequences of the brain and surrounding structures were obtained without and with intravenous contrast. CONTRAST:  5.63mL GADAVIST GADOBUTROL 1 MMOL/ML IV SOLN COMPARISON:  MR head without and with contrast 11/12/2019 FINDINGS: BRAIN New Lesions: 4. 2 mm mm enhancing lesion located along the left posterior falx with no surrounding edema and no mass effect seen on series 9, image 87. 4 mm mm enhancing lesion located in the anterior left frontal lobe with minimal surrounding edema and no mass effect seen on series 9 image 117. Punctate enhancing lesion located left parietal lobe with no surrounding edema and no mass effect seen on series 9, image 123. Punctate enhancing lesion located left parietal lobe with no surrounding edema and no mass effect seen on series 9, image 111. Larger lesions: 1 Treated enhancing lesion located posterior falx has increased from 13 x 24 x 17 mm to now 31 x 19 x 24 mm, with new or increased edema and mass effect seen on series 9, 106. Increased prominence of enhancement is noted along the left third cranial nerve extending through the foramen ovale I. Stable or Smaller lesions: Multiple Index peripherally enhancing lesions of the anterior left frontal lobe on image 112 if decreased from 15 to 13 mm and in the posterior right frontal lobe on image 135 from 65 mm. Other Brain findings: Diffuse T2 signal change has increased, consistent with edema or radiation change. Remote blood products are again noted within multiple lesions, most notably in the para falcine lesion. Vascular: Flow is present in the major intracranial arteries. Skull and upper cervical spine: Patchy marrow signal in the upper cervical  spine is again noted. No definite enhancing lesions are present. Post radiation changes  are present in the marrow of the calvarium. Sinuses/Orbits: The paranasal sinuses and mastoid air cells are clear. The globes and orbits are within normal limits. IMPRESSION: 1. Progression of metastatic disease as described. 2. At least 4 new lesions are present. 3. Increased prominence of enhancement along the left third cranial nerve extending through the foramen consistent with perineural spread of tumor. 4. Increased T2 signal change throughout the brain, consistent with edema or radiation change. 5. Increasing size of dominant lesion in the posterior falx. 6. Stable to slight decrease in size of multiple additional lesions. Electronically Signed: By: San Morelle M.D. On: 02/18/2020 07:33   MR THORACIC SPINE W WO CONTRAST  Result Date: 02/01/2020 CLINICAL DATA:  Lung cancer patient with widespread osseous metastatic disease EXAM: MRI THORACIC AND LUMBAR SPINE WITHOUT AND WITH CONTRAST TECHNIQUE: Multiplanar and multiecho pulse sequences of the thoracic and lumbar spine were obtained without and with intravenous contrast. CONTRAST:  75mL GADAVIST GADOBUTROL 1 MMOL/ML IV SOLN COMPARISON:  CT chest 12/09/2019.  Bone scan 09/14/2019. FINDINGS: MRI THORACIC SPINE FINDINGS Alignment:  No malalignment. Vertebrae: Widespread osseous metastatic disease throughout the region. Compared to the CT scan of July, there is a newly seen compression fracture at T1 with loss of height of 60% and posterior bowing of the posterior margin of the vertebral body. This narrows the spinal canal but does not appear to compress the spinal cord. Otherwise, there are smaller foci of metastatic disease in most of the thoracic vertebral levels, perhaps with sparing of T4, T5 and T11. There appears to be progressive tumor in the spinous process of T9. With mild encroachment upon the dorsal aspect of the spinal canal but no compression of the  cord. Disease in the pedicle on the left at T9 results in some encroachment upon the intervertebral foramina on the left at T8-9 and T9-10. Cord:  No evidence of primary cord metastatic lesion. Paraspinal and other soft tissues: Multiple rib metastases, not primarily evaluated. Disc levels: No evidence of disc pathology. MRI LUMBAR SPINE FINDINGS Segmentation:  5 lumbar type vertebral bodies. Alignment:  Straightening of the normal lumbar lordosis. Vertebrae: Metastatic lesions affecting all levels throughout the lumbar region, with an old partial compression fracture at L4 similar to the previous CT. No evidence of extraosseous tumor extension or detectable progressive disease compared to the previous CT. Disease in the pedicle on the left at L1 could be developing early foraminal involvement on the left at T12-L1 and L1-2. Conus medullaris: Extends to the L1 level and appears normal. No evidence of metastatic disease to the distal cord or nerve roots of the region. Paraspinal and other soft tissues: Negative. Extensive pelvic metastatic disease is evident, particularly of the right iliac bone. Disc levels: Disc bulges at L3-4 and L4-5. At L4-5, there is moderate multifactorial stenosis because of posterior bowing of the posteroinferior aspect of the L4 vertebral body and bulging of the L4-5 disc. IMPRESSION: 1. Widespread osseous metastatic disease throughout the thoracic and lumbar spine. Newly seen compression fracture at T1 with loss of height of 60% and posterior bowing of the posterior margin of the vertebral body. This narrows the spinal canal but does not appear to grossly compress the spinal cord. 2. Progressive disease within the spinous process of T9. Tumor in the left pedicle of T9 with early encroachment upon the foramina on the left at T8-9 and T9-10. 3. Widespread lumbar disease. Tumor in the left pedicle at L1 shows early foraminal extension at T12-L1 and L1-2.  4. Old compression deformity of L4  which has not changed since the previous CT. 5. Moderate multifactorial stenosis at the L4-5 disc level. Electronically Signed   By: Nelson Chimes M.D.   On: 02/01/2020 19:22   MR Lumbar Spine W Wo Contrast  Result Date: 02/01/2020 CLINICAL DATA:  Lung cancer patient with widespread osseous metastatic disease EXAM: MRI THORACIC AND LUMBAR SPINE WITHOUT AND WITH CONTRAST TECHNIQUE: Multiplanar and multiecho pulse sequences of the thoracic and lumbar spine were obtained without and with intravenous contrast. CONTRAST:  45mL GADAVIST GADOBUTROL 1 MMOL/ML IV SOLN COMPARISON:  CT chest 12/09/2019.  Bone scan 09/14/2019. FINDINGS: MRI THORACIC SPINE FINDINGS Alignment:  No malalignment. Vertebrae: Widespread osseous metastatic disease throughout the region. Compared to the CT scan of July, there is a newly seen compression fracture at T1 with loss of height of 60% and posterior bowing of the posterior margin of the vertebral body. This narrows the spinal canal but does not appear to compress the spinal cord. Otherwise, there are smaller foci of metastatic disease in most of the thoracic vertebral levels, perhaps with sparing of T4, T5 and T11. There appears to be progressive tumor in the spinous process of T9. With mild encroachment upon the dorsal aspect of the spinal canal but no compression of the cord. Disease in the pedicle on the left at T9 results in some encroachment upon the intervertebral foramina on the left at T8-9 and T9-10. Cord:  No evidence of primary cord metastatic lesion. Paraspinal and other soft tissues: Multiple rib metastases, not primarily evaluated. Disc levels: No evidence of disc pathology. MRI LUMBAR SPINE FINDINGS Segmentation:  5 lumbar type vertebral bodies. Alignment:  Straightening of the normal lumbar lordosis. Vertebrae: Metastatic lesions affecting all levels throughout the lumbar region, with an old partial compression fracture at L4 similar to the previous CT. No evidence of  extraosseous tumor extension or detectable progressive disease compared to the previous CT. Disease in the pedicle on the left at L1 could be developing early foraminal involvement on the left at T12-L1 and L1-2. Conus medullaris: Extends to the L1 level and appears normal. No evidence of metastatic disease to the distal cord or nerve roots of the region. Paraspinal and other soft tissues: Negative. Extensive pelvic metastatic disease is evident, particularly of the right iliac bone. Disc levels: Disc bulges at L3-4 and L4-5. At L4-5, there is moderate multifactorial stenosis because of posterior bowing of the posteroinferior aspect of the L4 vertebral body and bulging of the L4-5 disc. IMPRESSION: 1. Widespread osseous metastatic disease throughout the thoracic and lumbar spine. Newly seen compression fracture at T1 with loss of height of 60% and posterior bowing of the posterior margin of the vertebral body. This narrows the spinal canal but does not appear to grossly compress the spinal cord. 2. Progressive disease within the spinous process of T9. Tumor in the left pedicle of T9 with early encroachment upon the foramina on the left at T8-9 and T9-10. 3. Widespread lumbar disease. Tumor in the left pedicle at L1 shows early foraminal extension at T12-L1 and L1-2. 4. Old compression deformity of L4 which has not changed since the previous CT. 5. Moderate multifactorial stenosis at the L4-5 disc level. Electronically Signed   By: Nelson Chimes M.D.   On: 02/01/2020 19:22    [place] Clinician Interpretation: I have personally reviewed the radiological images as listed.  My interpretation, in the context of the patient's clinical presentation, is progressive disease   Assessment/Plan Metastatic  cancer to brain [facility]) [C79.31]  Ron Beske is clinically stable today aside from one breakthrough seizure likely secondary to Keppra compliance issues.  Unfortunately, brain MRI demonstrates 5 small enhancing foci  which are new and consistent with new metastases.  These do not overlap with previously treated regions.  The poster falcine dominant mass is enlarged, consistent with either radio-inflammatory process or frank tumor progression.    We recommended radiosurgery to the new sub-cm targets, discussed already with rad-onc team.  For dominant falcine mass, continued surveillance is warranted, but over a 2 month interval.  Will request adding on perfusion sequences to further characterized this lesion.  Will defer corticosteroids for now.  He is agreeable to this plan.   For extensive bony disease in the spine, we will defer to radiation oncology but he remains asymptomatic and further surveillance alone could be considered.    We re-emphasized the importance of twice daily Keppra rather than once daily.  He should return to clinic in 2-3 months following next set of imaging for evaluation, or sooner with any clinical/neurologic changes.   We appreciate the opportunity to participate in the care of Eriq Hufford.   All questions were answered. The patient knows to call the clinic with any problems, questions or concerns. No barriers to learning were detected.  I have spent a total of 40 minutes of face-to-face and non-face-to-face time, excluding clinical staff time, preparing to see patient, ordering tests and/or medications, counseling the patient, and independently interpreting results and communicating results to the patient/family/caregiver   Ventura Sellers, MD Medical Director of Neuro-Oncology [place] at [place] 02/20/20 11:10 AM

## 2020-02-20 NOTE — Telephone Encounter (Signed)
  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  05/30/2019-06/13/2018: The right lung target including T3, lumbar spine, right acetabulum and right pubic ramus were all treated to 30 Gy in 10 fractions.  05/04/2019-05/19/2019: The whole brain was treated to 30 Gy in 10 fractions  Narrative:  The patient was contacted today for routine follow-up. During treatment he did very well with radiotherapy and did not have significant desquamation. He is doing okay. He had repeat MRI for restaging purposes in the brain oncology program which was performed on 02/17/20 which showed increasing changes of the lesion in the posteiror falx felt to be consistent with radiation effect. There was a new lesion in the left occipital white matter measuring 2 mm, 2 mm in the left posterior falx, 4 mm in the left frontal lobe, a punctuated lesion in the left parietal lobe, and punctate enhancing lesion in the left parietal lobe with no edema/mass effect.  The patient was contacted today by phone, and his sister was able to join [place] on the call as well.  He has been doing pretty well since his recent radiation treatment, and is comfortable moving forward with treatment for his brain and spine.  Dr. Lisbeth Renshaw has reviewed his case and would recommend a course of stereotactic radiosurgery to his 5 lesions in the brain, he would recommend a single fraction to each site, and the patient is already established with Dr. Saintclair Halsted from when he was originally diagnosed with brain disease.  Dr. Saintclair Halsted will participate in the  planning and delivery of his treatment as well.  We discussed the rationale as well for stereotactic radiotherapy to the spine at T1 and T9, while the patient is not symptomatic at the sites, Dr. Ida Rogue review of these films are concerning for the fact that there could be disease anteriorly in the epidural space at T1 and posteriorly at T9.  For purposes of avoiding cord compression, he recommends treatment of these sites, the rationale for treatment with stereotactic approach versus conventional palliative radiation is because the areas in the T-spine do overlap to some degree with his prior treatment to the lung.  Impression/Plan: 1. 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 The patient has been doing well since completion of radiotherapy, and again we discussed the rationale for radiotherapy with stereotactic radiosurgery to the brain and stereotactic radiotherapy to T1 and T9.  All sites would be treated over a single fraction.  We discussed the risks, benefits, short and long-term effects of radiotherapy, the patient will come in this afternoon for simulation with IV contrast for his brain, he will need to have a more detailed MRI of the spine which is scheduled for 03/05/2020. Written consent will be signed this afternoon, and copies given to the patient.    Carola Rhine, PAC

## 2020-02-21 ENCOUNTER — Telehealth: Payer: Self-pay | Admitting: Internal Medicine

## 2020-02-21 NOTE — Telephone Encounter (Signed)
Scheduled per 9/13 los. Pt is aware of appt time and date.

## 2020-02-22 ENCOUNTER — Ambulatory Visit
Admission: RE | Admit: 2020-02-22 | Discharge: 2020-02-22 | Disposition: A | Discharge status: Home / Self Care | Payer: Medicaid Other | Source: Ambulatory Visit | Attending: Radiation Oncology | Admitting: Radiation Oncology

## 2020-02-22 ENCOUNTER — Other Ambulatory Visit: Payer: Self-pay

## 2020-02-22 ENCOUNTER — Other Ambulatory Visit: Payer: Self-pay | Admitting: Radiation Therapy

## 2020-02-22 DIAGNOSIS — C7951 Secondary malignant neoplasm of bone: Secondary | ICD-10-CM

## 2020-02-22 DIAGNOSIS — C3411 Malignant neoplasm of upper lobe, right bronchus or lung: Secondary | ICD-10-CM | POA: Insufficient documentation

## 2020-02-22 DIAGNOSIS — C7931 Secondary malignant neoplasm of brain: Secondary | ICD-10-CM

## 2020-02-22 MED ORDER — HEPARIN SOD (PORK) LOCK FLUSH 100 UNIT/ML IV SOLN
500.0000 [IU] | Freq: Once | INTRAVENOUS | Status: AC
  Administered 2020-02-22: 500 [IU] via INTRAVENOUS

## 2020-02-22 MED ORDER — HYDROCODONE-ACETAMINOPHEN 5-325 MG PO TABS
ORAL_TABLET | ORAL | 0 refills | Status: DC
Start: 2020-02-22 — End: 2020-03-23

## 2020-02-22 MED ORDER — SODIUM CHLORIDE 0.9% FLUSH
10.0000 mL | Freq: Once | INTRAVENOUS | Status: AC
  Administered 2020-02-22: 10 mL via INTRAVENOUS

## 2020-02-22 NOTE — Progress Notes (Signed)
Has armband been applied?  Yes ° °Does patient have an allergy to IV contrast dye?: n/a °  °Has patient ever received premedication for IV contrast dye?: No ° °Does patient take metformin?: No ° °If patient does take metformin when was the last dose: n/a ° °Date of lab work: 02/15/2020 °BUN: 9 °CR: 0.42 °eGfr: >60 ° °IV site: Left Chest Port ° °Has IV site been added to flowsheet?  Yes ° ° ° ° °

## 2020-02-22 NOTE — Progress Notes (Signed)
I consented pt for SRS brain and SRT for T1 and T9. Pt is having some pain in his upper T spine despite tylenol and tramadol. We discussed discontinuation of this regimen and replacement with norco 5/325 and that he should not take tylenol with this but his kidney function appears appropriate to consider ibuprofen if needed.     Carola Rhine, PAC

## 2020-02-29 ENCOUNTER — Ambulatory Visit: Payer: Medicaid Other

## 2020-02-29 ENCOUNTER — Ambulatory Visit: Payer: Medicaid Other | Admitting: Radiation Oncology

## 2020-02-29 ENCOUNTER — Other Ambulatory Visit: Payer: Self-pay

## 2020-02-29 ENCOUNTER — Inpatient Hospital Stay (HOSPITAL_BASED_OUTPATIENT_CLINIC_OR_DEPARTMENT_OTHER): Payer: Medicaid Other | Admitting: Nurse Practitioner

## 2020-02-29 DIAGNOSIS — C3411 Malignant neoplasm of upper lobe, right bronchus or lung: Secondary | ICD-10-CM | POA: Diagnosis not present

## 2020-02-29 DIAGNOSIS — Z5112 Encounter for antineoplastic immunotherapy: Secondary | ICD-10-CM | POA: Diagnosis not present

## 2020-02-29 NOTE — Assessment & Plan Note (Signed)
1. 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 -CT scan on 09/07/2019 showed improved lung lesion. L4 compression fracture from previous metastatic disease. -Bone scan is stable reviewed by me. -We reviewed his labs. LFTs are grossly normal. -We will discontinue carboplatin at this time. We will continue pemetrexed and pembrolizumab maintenance. I will increase pemetrexed to 500 mg per metered squared. -Labs on 02/15/2020 showed hemoglobin 7.3, creatinine 0.42, WBC 7.6, platelets 319 -Patient reports he is eating and drinking well.  He denies any nausea vomiting diarrhea. -We will see him back in 1 week for follow-up and treatment.  2. Brain metastasis: -Presentation with right arm weakness with brain MRI on 04/28/2019 showing widespread metastatic disease in the brain, approximately 22 lesions. -WBRT completed on 05/19/2019. -MRI on 08/11/2019 showed interval decrease in size of multiple enhancing lesions scattered throughout the brain parenchyma. Decrease in vasogenic edema. No new lesions. -MRI of the brain on 11/12/2019 showed 24 mm enhancing transfalcine lesion has increased in size compared to MRI from 08/11/2019, previously 15 mm. Other brain lesions have remained stable or decreased in size. Edema also improved.  3. Back pain: -XRT to the bone lesions on 06/14/2019. -Continue tramadol twice daily as needed. -He complained of right knee pain which is worse than his baseline. We have reviewed x-rays which showed arthritis and previous hardware. If pain persists will consider MRI.  4. Electrolyte abnormalities: -Potassium today is normal. He does not require any supplementation.  5. Normocytic anemia: -Hemoglobin today is 7.3. This is from chronic inflammation and bone marrow suppression. Nutritional deficiency work-up was negative.

## 2020-02-29 NOTE — Progress Notes (Signed)
Brandon Ferguson, [place] [address]   CLINIC:  Medical Oncology/Hematology  PCP:  Patient, No Pcp Per No address on file None   REASON FOR VISIT: Follow-up for lung cancer   BRIEF ONCOLOGIC HISTORY:  Oncology History  Malignant neoplasm of right upper lobe of lung ([place])  05/02/2019 Initial Diagnosis   Malignant neoplasm of upper lobe of right lung ([place])   05/19/2019 Cancer Staging   Staging form: Lung, AJCC 8th Edition - Clinical stage from 05/19/2019: Stage IVB (cT2b, cN1, pM1c) - Signed by Derek Jack, MD on 05/19/2019   07/05/2019 -  Chemotherapy   The patient had palonosetron (ALOXI) injection 0.25 mg, 0.25 mg, Intravenous,  Once, 4 of 4 cycles Administration: 0.25 mg (07/05/2019), 0.25 mg (08/16/2019), 0.25 mg (09/20/2019), 0.25 mg (07/26/2019) PEMEtrexed (ALIMTA) 900 mg in sodium chloride 0.9 % 100 mL chemo infusion, 500 mg/m2 = 900 mg, Intravenous,  Once, 11 of 12 cycles Dose modification: 400 mg/m2 (80 % of original dose 500 mg/m2, Cycle 4, Reason: Provider Judgment), 500 mg/m2 (100 % of original dose 500 mg/m2, Cycle 5, Reason: Provider Judgment) Administration: 900 mg (07/05/2019), 900 mg (08/16/2019), 700 mg (09/20/2019), 900 mg (10/11/2019), 900 mg (11/01/2019), 900 mg (07/26/2019), 900 mg (11/22/2019), 900 mg (12/14/2019), 900 mg (01/04/2020), 900 mg (01/25/2020), 900 mg (02/15/2020) CARBOplatin (PARAPLATIN) 580 mg in sodium chloride 0.9 % 250 mL chemo infusion, 580 mg (100 % of original dose 581.5 mg), Intravenous,  Once, 4 of 4 cycles Dose modification:   (original dose 581.5 mg, Cycle 1),   (original dose 581.5 mg, Cycle 3),   (original dose 465.2 mg, Cycle 4, Reason: Provider Judgment),   (original dose 581.5 mg, Cycle 2) Administration: 580 mg (07/05/2019), 580 mg (08/16/2019), 470 mg (09/20/2019), 580 mg (07/26/2019) fosaprepitant (EMEND) 150 mg in sodium chloride 0.9 % 145 mL IVPB, 150 mg, Intravenous,  Once, 4 of 4 cycles Administration: 150 mg  (07/05/2019), 150 mg (08/16/2019), 150 mg (09/20/2019), 150 mg (07/26/2019) pembrolizumab (KEYTRUDA) 200 mg in sodium chloride 0.9 % 50 mL chemo infusion, 200 mg, Intravenous, Once, 11 of 12 cycles Administration: 200 mg (07/05/2019), 200 mg (08/16/2019), 200 mg (09/20/2019), 200 mg (10/11/2019), 200 mg (11/01/2019), 200 mg (07/26/2019), 200 mg (11/22/2019), 200 mg (12/14/2019), 200 mg (01/04/2020), 200 mg (01/25/2020), 200 mg (02/15/2020)  for chemotherapy treatment.      CANCER STAGING: Cancer Staging Malignant neoplasm of right upper lobe of lung [facility]) Staging form: Lung, AJCC 8th Edition - Clinical stage from 05/19/2019: Stage IVB (cT2b, cN1, pM1c) - Signed by Derek Jack, MD on 05/19/2019    INTERVAL HISTORY:  Brandon Ferguson 59 y.o. male returns for routine follow-up for lung cancer.  Patient reports he is doing well since his last treatment.  He is eating and drinking appropriately.  He is drinking Ensure daily.  Denies any nausea, vomiting, or diarrhea. Denies any new pains. Had not noticed any recent bleeding such as epistaxis, hematuria or hematochezia. Denies recent chest pain on exertion, shortness of breath on minimal exertion, pre-syncopal episodes, or palpitations. Denies any numbness or tingling in hands or feet. Denies any recent fevers, infections, or recent hospitalizations. Patient reports appetite at100% and energy level at 80%.     REVIEW OF SYSTEMS:  Review of Systems  Constitutional: Positive for fatigue.  Neurological: Positive for numbness.  Psychiatric/Behavioral: Positive for sleep disturbance.  All other systems reviewed and are negative.    PAST MEDICAL/SURGICAL HISTORY:  Past Medical History:  Diagnosis Date  .  Cancer ([place])   . GERD (gastroesophageal reflux disease)   . Iron deficiency   . Port-A-Cath in place 06/17/2019  . Vitamin D deficiency    Past Surgical History:  Procedure Laterality Date  . KNEE SURGERY    . PORTACATH PLACEMENT Left 06/29/2019    Procedure: INSERTION PORT-A-CATH;  Surgeon: Aviva Signs, MD;  Location: [facility];  Service: General;  Laterality: Left;  [name] Kitchen VIDEO BRONCHOSCOPY Bilateral 04/29/2019   Procedure: VIDEO BRONCHOSCOPY WITH FLUORO;  Surgeon: Laurin Coder, MD;  Location: [facility];  Service: Endoscopy;  Laterality: Bilateral;  . VIDEO BRONCHOSCOPY Bilateral 05/03/2019   Procedure: VIDEO BRONCHOSCOPY WITHOUT FLUORO;  Surgeon: Rigoberto Noel, MD;  Location: [facility];  Service: Cardiopulmonary;  Laterality: Bilateral;     SOCIAL HISTORY:  Social History   Socioeconomic History  . Marital status: Single    Spouse name: Not on file  . Number of children: Not on file  . Years of education: Not on file  . Highest education level: Not on file  Occupational History  . Not on file  Tobacco Use  . Smoking status: Current Every Day Smoker    Packs/day: 1.00    Types: Cigarettes  . Smokeless tobacco: Never Used  . Tobacco comment: quit cigs 5 months ago  Vaping Use  . Vaping Use: Never used  Substance and Sexual Activity  . Alcohol use: Yes    Alcohol/week: 6.0 standard drinks    Types: 6 Cans of beer per week    Comment: pt drinks 3 large beers daily. hasnt had any in 5 months  . Drug use: Yes    Types: Marijuana    Comment: daily. as of Jan 18,2021 hasnt had any in 5 months  . Sexual activity: Not on file  Other Topics Concern  . Not on file  Social History Narrative  . Not on file   Social Determinants of Health   Financial Resource Strain: Low Risk   . Difficulty of Paying Living Expenses: Not very hard  Food Insecurity: No Food Insecurity  . Worried About Charity fundraiser in the Last Year: Never true  . Ran Out of Food in the Last Year: Never true  Transportation Needs: No Transportation Needs  . Lack of Transportation (Medical): No  . Lack of Transportation (Non-Medical): No  Physical Activity: Inactive  . Days of Exercise per Week: 0 days  . Minutes of Exercise per Session: 0  min  Stress: No Stress Concern Present  . Feeling of Stress : Only a little  Social Connections: Moderately Isolated  . Frequency of Communication with Friends and Family: More than three times a week  . Frequency of Social Gatherings with Friends and Family: More than three times a week  . Attends Religious Services: 1 to 4 times per year  . Active Member of Clubs or Organizations: No  . Attends Archivist Meetings: Never  . Marital Status: Never married  Intimate Partner Violence: Not At Risk  . Fear of Current or Ex-Partner: No  . Emotionally Abused: No  . Physically Abused: No  . Sexually Abused: No    FAMILY HISTORY:  Family History  Problem Relation Age of Onset  . Breast cancer Mother   . Cirrhosis Father   . Alcohol abuse Father   . Hypertension Sister   . Cancer Brother   . Hypertension Sister   . Hypertension Sister   . Hypertension Sister   . Hypertension Sister  CURRENT MEDICATIONS:  Outpatient Encounter Medications as of 02/29/2020  Medication Sig Note  . CARBOPLATIN IV Inject into the vein every 21 ( twenty-one) days. Every 3 weeks x 4 cycles   . ferrous sulfate 325 (65 FE) MG EC tablet Take 325 mg by mouth daily.   . folic acid (FOLVITE) 1 MG tablet Take 1 tablet (1 mg total) by mouth daily.   [name] Kitchen HYDROcodone-acetaminophen (NORCO) 5-325 MG tablet Take one tablet po q hs prn pain, or up to every 6 hours as needed.   . levETIRAcetam (KEPPRA) 500 MG tablet Take 1 tablet (500 mg total) by mouth 2 (two) times daily. 02/20/2020: Reports only taking once a day. Smith,Kimberly N, RN 02/20/20 11:08 AM     . magnesium oxide (MAG-OX) 400 (241.3 Mg) MG tablet Take 1 tablet (400 mg total) by mouth 2 (two) times daily.   . pantoprazole (PROTONIX) 40 MG tablet Take 40 mg by mouth daily.   [name] Kitchen PEMBROLIZUMAB IV Inject into the vein every 21 ( twenty-one) days.   [name] Kitchen PEMEtrexed 500 mg/m2 in sodium chloride 0.9 % 100 mL Inject 500 mg/m2 into the vein every 21 ( twenty-one)  days. Every 3 weeks x 4 cycles   . polyethylene glycol (MIRALAX / GLYCOLAX) 17 g packet Take 17 g by mouth daily as needed.   . prochlorperazine (COMPAZINE) 10 MG tablet Take 1 tablet (10 mg total) by mouth every 6 (six) hours as needed (Nausea or vomiting).   [name] Kitchen lidocaine-prilocaine (EMLA) cream Apply a small amount to port a cath site and cover with plastic wrap 1 hour prior to chemotherapy appointments (Patient not taking: Reported on 02/29/2020)    No facility-administered encounter medications on file as of 02/29/2020.    ALLERGIES:  No Known Allergies   PHYSICAL EXAM:  ECOG Performance status: 1  Vitals:   02/29/20 0924  BP: 110/68  Pulse: (!) 113  Resp: 17  Temp: (!) 96.8 F (36 C)  SpO2: 100%   Filed Weights   02/29/20 0924  Weight: 122 lb 11.2 oz (55.7 kg)   Physical Exam Constitutional:      Appearance: Normal appearance. He is normal weight.  Cardiovascular:     Rate and Rhythm: Normal rate and regular rhythm.     Heart sounds: Normal heart sounds.  Pulmonary:     Effort: Pulmonary effort is normal.     Breath sounds: Normal breath sounds.  Abdominal:     General: Bowel sounds are normal.  Musculoskeletal:        General: Normal range of motion.  Skin:    General: Skin is warm.  Neurological:     Mental Status: He is alert and oriented to person, place, and time. Mental status is at baseline.  Psychiatric:        Mood and Affect: Mood normal.        Behavior: Behavior normal.        Thought Content: Thought content normal.        Judgment: Judgment normal.      LABORATORY DATA:  I have reviewed the labs as listed.  CBC    Component Value Date/Time   WBC 7.6 02/15/2020 0845   RBC 2.78 (L) 02/15/2020 0845   HGB 7.3 (L) 02/15/2020 0845   HCT 24.6 (L) 02/15/2020 0845   PLT 319 02/15/2020 0845   MCV 88.5 02/15/2020 0845   MCH 26.3 02/15/2020 0845   MCHC 29.7 (L) 02/15/2020 0845   RDW 17.3 (H) 02/15/2020 0845  LYMPHSABS 0.8 02/15/2020 0845    MONOABS 0.9 02/15/2020 0845   EOSABS 0.1 02/15/2020 0845   BASOSABS 0.0 02/15/2020 0845   CMP Latest Ref Rng & Units 02/15/2020 02/01/2020 01/25/2020  Glucose 70 - 99 mg/dL 99 96 134(H)  BUN 6 - 20 mg/dL 9 11 9   Creatinine 0.61 - 1.24 mg/dL 0.42(L) 0.49(L) 0.45(L)  Sodium 135 - 145 mmol/L 136 135 135  Potassium 3.5 - 5.1 mmol/L 3.6 4.4 3.7  Chloride 98 - 111 mmol/L 97(L) 96(L) 100  CO2 22 - 32 mmol/L 26 26 25   Calcium 8.9 - 10.3 mg/dL 9.9 9.9 10.0  Total Protein 6.5 - 8.1 g/dL 8.5(H) 8.8(H) 8.4(H)  Total Bilirubin 0.3 - 1.2 mg/dL 0.7 0.3 0.3  Alkaline Phos 38 - 126 U/L 214(H) 163(H) 194(H)  AST 15 - 41 U/L 19 22 21   ALT 0 - 44 U/L 10 16 16     All questions were answered to patient's stated satisfaction. Encouraged patient to call with any new concerns or questions before his next visit to the cancer center and we can certain see him sooner, if needed.     ASSESSMENT & PLAN:  Malignant neoplasm of right upper lobe of lung ([place]) 1. 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 -CT scan on 09/07/2019 showed improved lung lesion. L4 compression fracture from previous metastatic disease. -Bone scan is stable reviewed by me. -We reviewed his labs. LFTs are grossly normal. -We will discontinue carboplatin at this time. We will continue pemetrexed and pembrolizumab maintenance. I will increase pemetrexed to 500 mg per metered squared. -Labs on 02/15/2020 showed hemoglobin 7.3, creatinine 0.42, WBC 7.6, platelets 319 -Patient reports he is eating and drinking well.  He denies any nausea vomiting diarrhea. -We will see him back in 1 week for follow-up and treatment.  2. Brain metastasis: -Presentation with right arm weakness with brain MRI on 04/28/2019 showing widespread metastatic disease in the brain, approximately 22 lesions. -WBRT completed on 05/19/2019. -MRI on 08/11/2019 showed  interval decrease in size of multiple enhancing lesions scattered throughout the brain parenchyma. Decrease in vasogenic edema. No new lesions. -MRI of the brain on 11/12/2019 showed 24 mm enhancing transfalcine lesion has increased in size compared to MRI from 08/11/2019, previously 15 mm. Other brain lesions have remained stable or decreased in size. Edema also improved.  3. Back pain: -XRT to the bone lesions on 06/14/2019. -Continue tramadol twice daily as needed. -He complained of right knee pain which is worse than his baseline. We have reviewed x-rays which showed arthritis and previous hardware. If pain persists will consider MRI.  4. Electrolyte abnormalities: -Potassium today is normal. He does not require any supplementation.  5. Normocytic anemia: -Hemoglobin today is 7.3. This is from chronic inflammation and bone marrow suppression. Nutritional deficiency work-up was negative.     Orders placed this encounter:  Orders Placed This Encounter  Procedures  . Lactate dehydrogenase  . CBC with Differential/Platelet  . Comprehensive metabolic panel      Francene Finders, FNP-C [place] [phone]

## 2020-03-01 ENCOUNTER — Ambulatory Visit
Admission: RE | Admit: 2020-03-01 | Discharge: 2020-03-01 | Disposition: A | Discharge status: Home / Self Care | Payer: Medicaid Other | Source: Ambulatory Visit | Attending: Radiation Oncology | Admitting: Radiation Oncology

## 2020-03-01 ENCOUNTER — Encounter: Payer: Self-pay | Admitting: Radiation Oncology

## 2020-03-01 ENCOUNTER — Other Ambulatory Visit: Payer: Self-pay

## 2020-03-01 ENCOUNTER — Other Ambulatory Visit: Payer: Self-pay | Admitting: Radiation Oncology

## 2020-03-01 DIAGNOSIS — C7951 Secondary malignant neoplasm of bone: Secondary | ICD-10-CM | POA: Diagnosis not present

## 2020-03-01 DIAGNOSIS — C7931 Secondary malignant neoplasm of brain: Secondary | ICD-10-CM | POA: Diagnosis not present

## 2020-03-01 MED ORDER — METHYLPREDNISOLONE 4 MG PO TABS: ORAL_TABLET | ORAL | 0 refills | Status: DC

## 2020-03-01 NOTE — Progress Notes (Signed)
Brandon Ferguson rested with Brandon Ferguson for 30 minutes following his SRS treatment.  Patient denies headache, dizziness, nausea, diplopia or ringing in the ears. Denies fatigue. Patient without complaints. Understands to avoid strenuous activity for the next 24 hours and call [phone] with needs.   BP (!) 119/96   Pulse 97   Temp 97.7 F (36.5 C)   Resp 18   SpO2 100%   Brandon Ferguson, BSN

## 2020-03-02 ENCOUNTER — Other Ambulatory Visit: Payer: Self-pay | Admitting: Radiation Therapy

## 2020-03-05 ENCOUNTER — Other Ambulatory Visit: Payer: Self-pay

## 2020-03-05 ENCOUNTER — Ambulatory Visit (HOSPITAL_COMMUNITY)
Admission: RE | Admit: 2020-03-05 | Discharge: 2020-03-05 | Disposition: A | Discharge status: Home / Self Care | Payer: Medicaid Other | Source: Ambulatory Visit | Attending: Radiation Oncology | Admitting: Radiation Oncology

## 2020-03-05 DIAGNOSIS — C7951 Secondary malignant neoplasm of bone: Secondary | ICD-10-CM | POA: Diagnosis present

## 2020-03-05 MED ORDER — GADOBUTROL 1 MMOL/ML IV SOLN
5.5000 mL | Freq: Once | INTRAVENOUS | Status: AC | PRN
  Administered 2020-03-05: 5.5 mL via INTRAVENOUS

## 2020-03-06 ENCOUNTER — Inpatient Hospital Stay: Payer: Medicaid Other

## 2020-03-06 ENCOUNTER — Other Ambulatory Visit: Payer: Self-pay

## 2020-03-06 ENCOUNTER — Ambulatory Visit
Admission: RE | Admit: 2020-03-06 | Discharge: 2020-03-06 | Disposition: A | Discharge status: Home / Self Care | Payer: Medicaid Other | Source: Ambulatory Visit | Attending: Radiation Oncology | Admitting: Radiation Oncology

## 2020-03-06 ENCOUNTER — Other Ambulatory Visit: Payer: Self-pay | Admitting: Radiation Therapy

## 2020-03-06 DIAGNOSIS — C7931 Secondary malignant neoplasm of brain: Secondary | ICD-10-CM

## 2020-03-06 DIAGNOSIS — C7951 Secondary malignant neoplasm of bone: Secondary | ICD-10-CM | POA: Diagnosis not present

## 2020-03-06 MED ORDER — DEXAMETHASONE 4 MG PO TABS
12.0000 mg | ORAL_TABLET | Freq: Once | ORAL | Status: AC
  Administered 2020-03-06: 12 mg via ORAL
  Filled 2020-03-06: qty 3

## 2020-03-06 MED ORDER — DEXAMETHASONE 4 MG PO TABS: 4.0000 mg | ORAL_TABLET | Freq: Three times a day (TID) | ORAL | 0 refills | Status: AC

## 2020-03-06 NOTE — Progress Notes (Signed)
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   05/30/2019-06/14/2019: The right lung target including T3, lumbar spine, right acetabulum and right pubic ramus were all treated to 30 Gy in 10 fractions.  05/04/2019-05/19/2019: The whole brain was treated to 30 Gy in 10 fractions   Narrative:  Brandon Ferguson is a pleasant 59 year old gentleman with a history of stage IV adenocarcinoma of the right upper lobe who presented with whole disease in the brain and was treated with whole brain radiation as well as palliative radiotherapy to the right lung at the end of 2020 into the early part of January 2021.  He has been under the care of Dr. Delton Ferguson in [place], and has been receiving palliative systemic chemo immunotherapy.  Recently he was found to have disease in the right iliac region was treated with palliative radiotherapy to the site in August of this year, he is followed by the brain oncology program as well because of his prior disease in the brain and whole brain radiation therapy, and recent MRI showed progressive disease in 5 lesions.  He underwent salvage radiotherapy with SRS about a week ago.  He was found as well to have disease in the thoracic and lumbar spine, and was felt to be a good candidate for stereotactic radiation to his to her thoracic spine specifically at T1 and T9.  He has been having pain and recently notified us of progressive pain despite using tramadol.  He was started on a  Medrol Dosepak after conversing and discussing his case with Dr. Lisbeth Ferguson.  He was in today for simulation of his thoracic spine, and staff noticed that he was having a harder time with his lower extremity on the left specifically with weakness and inability to walk.  He was brought around to the clinic, and upon further evaluation it sounds as though he has been having quite a difficult time at home.  He is scheduled for his next infusion of pemetrexed and pembrolizumab tomorrow.  Of note he was also discussed in palliative care conference this morning, he has quite a lot going on regarding his own care, recent loss of his brother who died of lung cancer within the last 2 weeks, transportation needs, and fixed income.  His pain is well continues to be an ongoing issue.  Physical Exam: In general this is a cachectic appearing thin African American male in no acute distress. He's alert and oriented x4 and appropriate throughout the examination. Cardiopulmonary assessment is negative for acute distress and he exhibits normal effort. He has intact sensory perception to light touch throughout the lower extremities bilaterally but has 2/5 strength in the left lower extremities.    Impression/Plan: 1. 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  I spent time this afternoon discussing the patient's case, understanding the symptoms, and reviewing with Dr. Lisbeth Ferguson.  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  His symptoms to me in the lower portion of the lumbar spine, around L3/L4 dermatome.  We gave the patient 8 mg of p.o. dexamethasone and 4 mg to take again tonight.  A new prescription has been sent to his pharmacy for dexamethasone 4 mg 3 times daily.  The pharmacy was willing to cover the expenses the patient was unable to afford the  prescription which upon my calling was $3.  I am very concerned about the patient's overall status, I also reach out to Dr. Delton Ferguson to discuss our concerns.  I let the patient know that we would be in touch with him as soon as we can get a stat MRI of his lumbar spine, and the risks of paralysis.  We are very much hoping to avoid this issue, and he understands the importance of taking steroids after reviewing this again.  I also discussed that I thought it would be a good idea for him to meet with Dr. Hilma Ferguson who has agreed to meet with him when he comes in for his treatment to his thoracic spine next Tuesday.  We will be closely following along with these results of his MRI and anticipate urgent radiotherapy if he truly has cord compromise.    Brandon Ferguson, PAC

## 2020-03-07 ENCOUNTER — Inpatient Hospital Stay (HOSPITAL_COMMUNITY): Payer: Medicaid Other

## 2020-03-07 ENCOUNTER — Telehealth: Payer: Self-pay | Admitting: Radiation Oncology

## 2020-03-07 ENCOUNTER — Other Ambulatory Visit: Payer: Self-pay | Admitting: Radiation Therapy

## 2020-03-07 ENCOUNTER — Inpatient Hospital Stay (HOSPITAL_BASED_OUTPATIENT_CLINIC_OR_DEPARTMENT_OTHER): Payer: Medicaid Other | Admitting: Hematology

## 2020-03-07 ENCOUNTER — Ambulatory Visit (HOSPITAL_COMMUNITY)
Admission: RE | Admit: 2020-03-07 | Discharge: 2020-03-07 | Disposition: A | Discharge status: Home / Self Care | Payer: Medicaid Other | Source: Ambulatory Visit | Attending: Radiation Oncology | Admitting: Radiation Oncology

## 2020-03-07 VITALS — BP 109/72 | HR 91 | Temp 96.8°F | Resp 17

## 2020-03-07 VITALS — BP 120/78 | HR 108 | Temp 96.8°F | Resp 16

## 2020-03-07 DIAGNOSIS — C3411 Malignant neoplasm of upper lobe, right bronchus or lung: Secondary | ICD-10-CM | POA: Diagnosis not present

## 2020-03-07 DIAGNOSIS — Z95828 Presence of other vascular implants and grafts: Secondary | ICD-10-CM

## 2020-03-07 DIAGNOSIS — C7951 Secondary malignant neoplasm of bone: Secondary | ICD-10-CM | POA: Insufficient documentation

## 2020-03-07 DIAGNOSIS — R29898 Other symptoms and signs involving the musculoskeletal system: Secondary | ICD-10-CM

## 2020-03-07 DIAGNOSIS — Z5112 Encounter for antineoplastic immunotherapy: Secondary | ICD-10-CM | POA: Diagnosis not present

## 2020-03-07 LAB — COMPREHENSIVE METABOLIC PANEL
ALT: 24 U/L (ref 0–44)
AST: 24 U/L (ref 15–41)
Albumin: 3 g/dL — ABNORMAL LOW (ref 3.5–5.0)
Alkaline Phosphatase: 245 U/L — ABNORMAL HIGH (ref 38–126)
Anion gap: 13 (ref 5–15)
BUN: 20 mg/dL (ref 6–20)
CO2: 27 mmol/L (ref 22–32)
Calcium: 10 mg/dL (ref 8.9–10.3)
Chloride: 95 mmol/L — ABNORMAL LOW (ref 98–111)
Creatinine, Ser: 0.44 mg/dL — ABNORMAL LOW (ref 0.61–1.24)
GFR calc Af Amer: >60 mL/min (ref >60)
GFR calc non Af Amer: >60 mL/min (ref >60)
Glucose, Bld: 172 mg/dL — ABNORMAL HIGH (ref 70–99)
Potassium: 3.4 mmol/L — ABNORMAL LOW (ref 3.5–5.1)
Sodium: 135 mmol/L (ref 135–145)
Total Bilirubin: 0.4 mg/dL (ref 0.3–1.2)
Total Protein: 8.9 g/dL — ABNORMAL HIGH (ref 6.5–8.1)

## 2020-03-07 LAB — CBC WITH DIFFERENTIAL/PLATELET
Abs Immature Granulocytes: 0.09 10*3/uL — ABNORMAL HIGH (ref 0.00–0.07)
Basophils Absolute: 0 10*3/uL (ref 0.0–0.1)
Basophils Relative: 0 %
Eosinophils Absolute: 0 10*3/uL (ref 0.0–0.5)
Eosinophils Relative: 0 %
HCT: 26.7 % — ABNORMAL LOW (ref 39.0–52.0)
Hemoglobin: 7.7 g/dL — ABNORMAL LOW (ref 13.0–17.0)
Immature Granulocytes: 1 %
Lymphocytes Relative: 7 %
Lymphs Abs: 1 10*3/uL (ref 0.7–4.0)
MCH: 25.1 pg — ABNORMAL LOW (ref 26.0–34.0)
MCHC: 28.8 g/dL — ABNORMAL LOW (ref 30.0–36.0)
MCV: 87 fL (ref 80.0–100.0)
Monocytes Absolute: 1.1 10*3/uL — ABNORMAL HIGH (ref 0.1–1.0)
Monocytes Relative: 9 %
Neutro Abs: 10.9 10*3/uL — ABNORMAL HIGH (ref 1.7–7.7)
Neutrophils Relative %: 83 %
Platelets: 432 10*3/uL — ABNORMAL HIGH (ref 150–400)
RBC: 3.07 MIL/uL — ABNORMAL LOW (ref 4.22–5.81)
RDW: 18.8 % — ABNORMAL HIGH (ref 11.5–15.5)
WBC: 13.1 10*3/uL — ABNORMAL HIGH (ref 4.0–10.5)
nRBC: 0 % (ref 0.0–0.2)

## 2020-03-07 MED ORDER — HEPARIN SOD (PORK) LOCK FLUSH 100 UNIT/ML IV SOLN
500.0000 [IU] | Freq: Once | INTRAVENOUS | Status: AC
  Administered 2020-03-07: 500 [IU] via INTRAVENOUS

## 2020-03-07 MED ORDER — DEXAMETHASONE SODIUM PHOSPHATE 10 MG/ML IJ SOLN
10.0000 mg | Freq: Once | INTRAMUSCULAR | Status: DC
  Filled 2020-03-07: qty 1

## 2020-03-07 MED ORDER — SODIUM CHLORIDE 0.9% FLUSH
10.0000 mL | Freq: Once | INTRAVENOUS | Status: AC
  Administered 2020-03-07: 10 mL

## 2020-03-07 MED ORDER — SODIUM CHLORIDE 0.9 % IV SOLN
Freq: Once | INTRAVENOUS | Status: AC
  Administered 2020-03-07: 10 mg via INTRAVENOUS
  Filled 2020-03-07: qty 10

## 2020-03-07 MED ORDER — GADOBUTROL 1 MMOL/ML IV SOLN
6.0000 mL | Freq: Once | INTRAVENOUS | Status: AC | PRN
  Administered 2020-03-07: 6 mL via INTRAVENOUS

## 2020-03-07 MED ORDER — SODIUM CHLORIDE 0.9 % IV SOLN: Freq: Once | INTRAVENOUS | Status: AC

## 2020-03-07 NOTE — Progress Notes (Signed)
Patient will not get his treatment today. [name] wants him to have decadron 10 mg IV times one dose today.  Patient will have MRI spine today as scheduled.  He will complete his radiation and then follow up with [place] afterwards.

## 2020-03-07 NOTE — Progress Notes (Signed)
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 Carboplatin, Keytruda and Alimta x 4 cycles from 07/05/2019 to 09/20/2019. 2. Radiation x 9 treatments from 01/10/2020 to 01/24/2020.  NGS Results: PD-L1 TPS 20%  CURRENT THERAPY: Maintenance Keytruda and pemetrexed  BRIEF ONCOLOGIC HISTORY:  Oncology History  Malignant neoplasm of right upper lobe of lung ([place])  05/02/2019 Initial Diagnosis   Malignant neoplasm of upper lobe of right lung ([place])   05/19/2019 Cancer Staging   Staging form: Lung, AJCC 8th Edition - Clinical stage from 05/19/2019: Stage IVB (cT2b, cN1, pM1c) - Signed by Derek Jack, MD on 05/19/2019   07/05/2019 -  Chemotherapy   The patient had palonosetron (ALOXI) injection 0.25 mg, 0.25 mg, Intravenous,  Once, 4 of 4 cycles Administration: 0.25 mg (07/05/2019), 0.25 mg (08/16/2019), 0.25 mg (09/20/2019), 0.25 mg (07/26/2019) PEMEtrexed (ALIMTA) 900 mg in sodium chloride 0.9 % 100 mL chemo infusion, 500 mg/m2 = 900 mg, Intravenous,  Once, 11 of 12 cycles Dose modification: 400 mg/m2 (80 % of original dose 500 mg/m2, Cycle 4, Reason: Provider Judgment), 500 mg/m2 (100 % of original dose 500 mg/m2, Cycle 5, Reason: Provider Judgment) Administration: 900 mg (07/05/2019), 900 mg (08/16/2019), 700 mg (09/20/2019), 900 mg (10/11/2019), 900 mg (11/01/2019), 900 mg (07/26/2019), 900 mg (11/22/2019), 900 mg (12/14/2019), 900 mg (01/04/2020), 900 mg (01/25/2020), 900 mg (02/15/2020) CARBOplatin (PARAPLATIN) 580 mg in sodium chloride 0.9 % 250 mL chemo infusion, 580 mg (100 % of original dose 581.5 mg), Intravenous,  Once, 4 of 4 cycles Dose modification:   (original dose 581.5 mg, Cycle 1),   (original dose 581.5 mg, Cycle 3),   (original dose 465.2 mg, Cycle 4, Reason: Provider Judgment),    (original dose 581.5 mg, Cycle 2) Administration: 580 mg (07/05/2019), 580 mg (08/16/2019), 470 mg (09/20/2019), 580 mg (07/26/2019) fosaprepitant (EMEND) 150 mg in sodium chloride 0.9 % 145 mL IVPB, 150 mg, Intravenous,  Once, 4 of 4 cycles Administration: 150 mg (07/05/2019), 150 mg (08/16/2019), 150 mg (09/20/2019), 150 mg (07/26/2019) pembrolizumab (KEYTRUDA) 200 mg in sodium chloride 0.9 % 50 mL chemo infusion, 200 mg, Intravenous, Once, 11 of 12 cycles Administration: 200 mg (07/05/2019), 200 mg (08/16/2019), 200 mg (09/20/2019), 200 mg (10/11/2019), 200 mg (11/01/2019), 200 mg (07/26/2019), 200 mg (11/22/2019), 200 mg (12/14/2019), 200 mg (01/04/2020), 200 mg (01/25/2020), 200 mg (02/15/2020)  for chemotherapy treatment.      CANCER STAGING: Cancer Staging Malignant neoplasm of right upper lobe of lung [facility]) Staging form: Lung, AJCC 8th Edition - Clinical stage from 05/19/2019: Stage IVB (cT2b, cN1, pM1c) - Signed by Derek Jack, MD on 05/19/2019   INTERVAL HISTORY:  Brandon Ferguson, a 59 y.o. male, returns for routine follow-up and consideration for next cycle of chemotherapy. Brandon Ferguson was last seen on 02/15/2020.  Due for cycle #12 of Keytruda and pemetrexed today.   Today Brandon Ferguson is accompanied by his sister. Overall, Brandon Ferguson tells me Brandon Ferguson has been feeling bad. Brandon Ferguson reports having extreme weakness in his legs and Brandon Ferguson is not able to stand up; the weakness started out gradually 1 week ago and Brandon Ferguson has fallen twice since then. Brandon Ferguson started taking Decadron this morning and Brandon Ferguson did not receive an IV of dexamethasone. Brandon Ferguson is able to control his urination and BM's.  Brandon Ferguson is scheduled  for an MRI lumbar spine today. Brandon Ferguson will begin radiation therapy on 10/5.   REVIEW OF SYSTEMS:  Review of Systems  Constitutional: Positive for appetite change (75%) and fatigue (depleted).  Genitourinary: Positive for frequency.   Musculoskeletal: Positive for arthralgias (R arm and back pain).  Neurological: Positive for extremity  weakness (extreme weakness in both legs).  All other systems reviewed and are negative.   PAST MEDICAL/SURGICAL HISTORY:  Past Medical History:  Diagnosis Date  . Cancer ([place])   . GERD (gastroesophageal reflux disease)   . Iron deficiency   . Port-A-Cath in place 06/17/2019  . Vitamin D deficiency    Past Surgical History:  Procedure Laterality Date  . KNEE SURGERY    . PORTACATH PLACEMENT Left 06/29/2019   Procedure: INSERTION PORT-A-CATH;  Surgeon: Aviva Signs, MD;  Location: [facility];  Service: General;  Laterality: Left;  [name] Kitchen VIDEO BRONCHOSCOPY Bilateral 04/29/2019   Procedure: VIDEO BRONCHOSCOPY WITH FLUORO;  Surgeon: Laurin Coder, MD;  Location: [facility];  Service: Endoscopy;  Laterality: Bilateral;  . VIDEO BRONCHOSCOPY Bilateral 05/03/2019   Procedure: VIDEO BRONCHOSCOPY WITHOUT FLUORO;  Surgeon: Rigoberto Noel, MD;  Location: [facility];  Service: Cardiopulmonary;  Laterality: Bilateral;    SOCIAL HISTORY:  Social History   Socioeconomic History  . Marital status: Single    Spouse name: Not on file  . Number of children: Not on file  . Years of education: Not on file  . Highest education level: Not on file  Occupational History  . Not on file  Tobacco Use  . Smoking status: Current Every Day Smoker    Packs/day: 1.00    Types: Cigarettes  . Smokeless tobacco: Never Used  . Tobacco comment: quit cigs 5 months ago  Vaping Use  . Vaping Use: Never used  Substance and Sexual Activity  . Alcohol use: Yes    Alcohol/week: 6.0 standard drinks    Types: 6 Cans of beer per week    Comment: pt drinks 3 large beers daily. hasnt had any in 5 months  . Drug use: Yes    Types: Marijuana    Comment: daily. as of Jan 18,2021 hasnt had any in 5 months  . Sexual activity: Not on file  Other Topics Concern  . Not on file  Social History Narrative  . Not on file   Social Determinants of Health   Financial Resource Strain: Low Risk   . Difficulty of Paying  Living Expenses: Not very hard  Food Insecurity: No Food Insecurity  . Worried About Charity fundraiser in the Last Year: Never true  . Ran Out of Food in the Last Year: Never true  Transportation Needs: No Transportation Needs  . Lack of Transportation (Medical): No  . Lack of Transportation (Non-Medical): No  Physical Activity: Inactive  . Days of Exercise per Week: 0 days  . Minutes of Exercise per Session: 0 min  Stress: No Stress Concern Present  . Feeling of Stress : Only a little  Social Connections: Moderately Isolated  . Frequency of Communication with Friends and Family: More than three times a week  . Frequency of Social Gatherings with Friends and Family: More than three times a week  . Attends Religious Services: 1 to 4 times per year  . Active Member of Clubs or Organizations: No  . Attends Archivist Meetings: Never  . Marital Status: Never married  Intimate Partner Violence: Not At Risk  . Fear of Current or  Ex-Partner: No  . Emotionally Abused: No  . Physically Abused: No  . Sexually Abused: No    FAMILY HISTORY:  Family History  Problem Relation Age of Onset  . Breast cancer Mother   . Cirrhosis Father   . Alcohol abuse Father   . Hypertension Sister   . Cancer Brother   . Hypertension Sister   . Hypertension Sister   . Hypertension Sister   . Hypertension Sister     CURRENT MEDICATIONS:  Current Outpatient Medications  Medication Sig Dispense Refill  . CARBOPLATIN IV Inject into the vein every 21 ( twenty-one) days. Every 3 weeks x 4 cycles    . dexamethasone (DECADRON) 4 MG tablet Take 1 tablet (4 mg total) by mouth 3 (three) times daily. 90 tablet 0  . ferrous sulfate 325 (65 FE) MG EC tablet Take 325 mg by mouth daily.    . folic acid (FOLVITE) 1 MG tablet Take 1 tablet (1 mg total) by mouth daily. 30 tablet 3  . HYDROcodone-acetaminophen (NORCO) 5-325 MG tablet Take one tablet po q hs prn pain, or up to every 6 hours as needed. 45  tablet 0  . levETIRAcetam (KEPPRA) 500 MG tablet Take 1 tablet (500 mg total) by mouth 2 (two) times daily. 60 tablet 3  . lidocaine-prilocaine (EMLA) cream Apply a small amount to port a cath site and cover with plastic wrap 1 hour prior to chemotherapy appointments 30 g 3  . magnesium oxide (MAG-OX) 400 (241.3 Mg) MG tablet Take 1 tablet (400 mg total) by mouth 2 (two) times daily. 60 tablet 3  . pantoprazole (PROTONIX) 40 MG tablet Take 40 mg by mouth daily.    [name] Kitchen PEMBROLIZUMAB IV Inject into the vein every 21 ( twenty-one) days.    [name] Kitchen PEMEtrexed 500 mg/m2 in sodium chloride 0.9 % 100 mL Inject 500 mg/m2 into the vein every 21 ( twenty-one) days. Every 3 weeks x 4 cycles    . polyethylene glycol (MIRALAX / GLYCOLAX) 17 g packet Take 17 g by mouth daily as needed. 14 each 0  . prochlorperazine (COMPAZINE) 10 MG tablet Take 1 tablet (10 mg total) by mouth every 6 (six) hours as needed (Nausea or vomiting). 30 tablet 1   No current facility-administered medications for this visit.    ALLERGIES:  No Known Allergies  PHYSICAL EXAM:  Performance status (ECOG): 1 - Symptomatic but completely ambulatory  Vitals:   03/07/20 0952  BP: 120/78  Pulse: (!) 108  Resp: 16  Temp: (!) 96.8 F (36 C)  SpO2: 100%   Wt Readings from Last 3 Encounters:  02/29/20 122 lb 11.2 oz (55.7 kg)  02/20/20 120 lb 6.4 oz (54.6 kg)  02/15/20 125 lb 8 oz (56.9 kg)   Physical Exam Vitals reviewed.  Constitutional:      Appearance: Brandon Ferguson is toxic-appearing.  Chest:     Comments: Port-a-Cath in L chest Neurological:     General: No focal deficit present.     Mental Status: Brandon Ferguson is alert and oriented to person, place, and time.     Motor: Weakness (2/5 L hip; 4/5 R hip) present.  Psychiatric:        Mood and Affect: Mood normal.        Behavior: Behavior normal.     LABORATORY DATA:  I have reviewed the labs as listed.  CBC Latest Ref Rng & Units 03/07/2020 02/15/2020 02/01/2020  WBC 4.0 - 10.5 K/uL 13.1(H)  7.6 4.9  Hemoglobin 13.0 -  17.0 g/dL 7.7(L) 7.3(L) 7.6(L)  Hematocrit 39 - 52 % 26.7(L) 24.6(L) 25.7(L)  Platelets 150 - 400 K/uL 432(H) 319 343   CMP Latest Ref Rng & Units 03/07/2020 02/15/2020 02/01/2020  Glucose 70 - 99 mg/dL 172(H) 99 96  BUN 6 - 20 mg/dL 20 9 11   Creatinine 0.61 - 1.24 mg/dL 0.44(L) 0.42(L) 0.49(L)  Sodium 135 - 145 mmol/L 135 136 135  Potassium 3.5 - 5.1 mmol/L 3.4(L) 3.6 4.4  Chloride 98 - 111 mmol/L 95(L) 97(L) 96(L)  CO2 22 - 32 mmol/L 27 26 26   Calcium 8.9 - 10.3 mg/dL 10.0 9.9 9.9  Total Protein 6.5 - 8.1 g/dL 8.9(H) 8.5(H) 8.8(H)  Total Bilirubin 0.3 - 1.2 mg/dL 0.4 0.7 0.3  Alkaline Phos 38 - 126 U/L 245(H) 214(H) 163(H)  AST 15 - 41 U/L 24 19 22   ALT 0 - 44 U/L 24 10 16     DIAGNOSTIC IMAGING:  I have independently reviewed the scans and discussed with the patient. MR BRAIN W WO CONTRAST  Addendum Date: 02/20/2020   ADDENDUM REPORT: 02/20/2020 08:19 ADDENDUM: An additional new lesion is seen in the left occipital white matter, 2 mm in diameter as measured on 9:74. Electronically Signed   By: Monte Fantasia M.D.   On: 02/20/2020 08:19   Result Date: 02/20/2020 CLINICAL DATA:  Metastatic adenocarcinoma of the lung. Assess treatment. EXAM: MRI HEAD WITHOUT AND WITH CONTRAST TECHNIQUE: Multiplanar, multiecho pulse sequences of the brain and surrounding structures were obtained without and with intravenous contrast. CONTRAST:  5.73m GADAVIST GADOBUTROL 1 MMOL/ML IV SOLN COMPARISON:  MR head without and with contrast 11/12/2019 FINDINGS: BRAIN New Lesions: 4. 2 mm mm enhancing lesion located along the left posterior falx with no surrounding edema and no mass effect seen on series 9, image 87. 4 mm mm enhancing lesion located in the anterior left frontal lobe with minimal surrounding edema and no mass effect seen on series 9 image 117. Punctate enhancing lesion located left parietal lobe with no surrounding edema and no mass effect seen on series 9, image 123. Punctate  enhancing lesion located left parietal lobe with no surrounding edema and no mass effect seen on series 9, image 111. Larger lesions: 1 Treated enhancing lesion located posterior falx has increased from 13 x 24 x 17 mm to now 31 x 19 x 24 mm, with new or increased edema and mass effect seen on series 9, 106. Increased prominence of enhancement is noted along the left third cranial nerve extending through the foramen ovale I. Stable or Smaller lesions: Multiple Index peripherally enhancing lesions of the anterior left frontal lobe on image 112 if decreased from 15 to 13 mm and in the posterior right frontal lobe on image 135 from 65 mm. Other Brain findings: Diffuse T2 signal change has increased, consistent with edema or radiation change. Remote blood products are again noted within multiple lesions, most notably in the para falcine lesion. Vascular: Flow is present in the major intracranial arteries. Skull and upper cervical spine: Patchy marrow signal in the upper cervical spine is again noted. No definite enhancing lesions are present. Post radiation changes are present in the marrow of the calvarium. Sinuses/Orbits: The paranasal sinuses and mastoid air cells are clear. The globes and orbits are within normal limits. IMPRESSION: 1. Progression of metastatic disease as described. 2. At least 4 new lesions are present. 3. Increased prominence of enhancement along the left third cranial nerve extending through the foramen consistent with perineural spread of  tumor. 4. Increased T2 signal change throughout the brain, consistent with edema or radiation change. 5. Increasing size of dominant lesion in the posterior falx. 6. Stable to slight decrease in size of multiple additional lesions. Electronically Signed: By: San Morelle M.D. On: 02/18/2020 07:33   MR THORACIC SPINE W WO CONTRAST  Result Date: 03/06/2020 CLINICAL DATA:  58 year old male with widely metastatic lung cancer. SRS treatment planning for  T1 and T9 metastases. EXAM: MRI THORACIC WITHOUT AND WITH CONTRAST TECHNIQUE: Multiplanar and multiecho pulse sequences of the thoracic spine were obtained without and with intravenous contrast. CONTRAST:  5.73m GADAVIST GADOBUTROL 1 MMOL/ML IV SOLN COMPARISON:  Thoracic and lumbar MRI 02/01/2020. FINDINGS: Limited cervical spine imaging: Cervical vertebral metastases sparing only the C1 level. Thoracic spine segmentation: Appears to be normal, and is the same numbering system used on the August MRI. Alignment:  Stable thoracic kyphosis. Vertebrae: Diffuse thoracic vertebral metastatic disease, with all levels affected as before. The T4 vertebra appears least affected as in August. Mild progression of T5 vertebral body involvement since August. Widespread rib metastases. Dedicated thin slice imaging through T1 where pathologic compression fracture is redemonstrated. Retropulsed bone and/or tumor (series 11, image 20) resulting in moderate cord compression. Associated cord mass effect appears mildly increased from last month, with no obvious cord edema. Associated bilateral T1 foraminal stenosis was better demonstrated using the standard protocol last month. Dedicated thin slice imaging through T9. Extensive infiltration of the left body and left greater than right posterior elements of that level (series 11, image 66) with expansion of the bone and extraosseous extension obliterating the T9 neural foramen, especially the left. But only borderline to mild malignant spinal stenosis there at this time. No cord mass effect. No pathologic fracture. Cord: Moderate cord compression at the T1 level as stated above. No other thoracic cord mass effect identified. No cord edema identified. No abnormal intradural enhancement identified. Paraspinal and other soft tissues: Substantially progressed and now 4.5 cm long axis posterior right paraspinal metastasis at the T11 level. See series 8, image 4 and series 11, image 95. This  was only a subtle 10 mm lesion in August (on series 20, image 36 at that time. Spiculated posterior right upper lobe lung mass. No pericardial or pleural effusion. Stable visible upper abdominal viscera. IMPRESSION: 1. Study for stereotactic radio surgery planning at T1 and T9. 2. Pathologic T1 compression fracture with bulky retropulsion of bone and/or tumor resulting in moderate cord compression (series 11, image 18) and moderate to severe bilateral T1 foraminal stenosis. 3. Infiltrated and expanded T9 vertebra, a especially the posterior elements on the left. Obliterated T9 neural foramina, especially the left. Early epidural space extension with mild spinal stenosis but no cord compression. 4. No spinal cord edema or intradural metastasis identified. 5. Incidentally noted substantially enlarged T11 level right posterior paraspinal muscle metastasis since August, now up to 4.5 cm (series 8, image 4). 6. Underlying diffuse spinal and rib metastases. Electronically Signed   By: HGenevie AnnM.D.   On: 03/06/2020 04:54     ASSESSMENT:  1. 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 -4 cycles of carboplatin, pemetrexed and pembrolizumab from 07/05/2019 through 09/20/2019. -CT scan on 09/07/2019 showed improved lung lesion. L4 compression fracture from previous metastatic disease. -Bone scan was stable. -Maintenance pemetrexed and pembrolizumab started on 10/11/2019. -CT CAP on 12/09/2019 showed mixed response with significant reduction in size of the right  upper lobe mass. However right iliac crest lesion has increased in size with soft tissue mass and lytic destruction. Most of further bone lesions appear similar in size and distribution. -XRT to the right pelvis started on 01/10/2020. -MRI of the thoracic and lumbar spine on 02/01/2020 showed new compression fracture at T1 with loss of height by 60%.  No cord compression.  Progressive  disease of spinous process of T9.  Tumor in the left pedicle of T9 with early encroachment upon the foramina on the left [id number].  Tumor in the left pedicle at L1 shows a left foraminal extension.  Old compression deformity of L4 not changed.  2. Brain metastasis: -Presentation with right arm weakness with brain MRI on 04/28/2019 showing widespread metastatic disease in the brain, approximately 22 lesions. -WBRT completed on 05/19/2019. -MRI on 08/11/2019 showed interval decrease in size of multiple enhancing lesions scattered throughout the brain parenchyma. Decrease in vasogenic edema. No new lesions. -MRI of the brain on 11/12/2019 showed 24 mm enhancing transfalcine lesion has increased in size compared to MRI from 08/11/2019, previously 15 mm. Other brain lesions have remained stable or decreased in size. Edema also improved. -MRI of the brain on 02/17/2020 showed at least 4 new lesions seen.  Brandon Ferguson was recommended stereotactic radiosurgery.  3. Back pain: -Completed XRT to the bone metastasis on 06/14/2019.   PLAN:  1. Metastatic lung adenocarcinoma: -Brandon Ferguson reportedly developed weakness in the left leg about 1 week ago.  Brandon Ferguson is not able to stand or lift his left leg. -MRI thoracic spine on 03/05/2020 shows pathological T1 compression fracture with bulky retropulsion of bone and tumor resulting in moderate cord compression and moderate to severe bilateral T1 foramina stenosis.  Infiltrated and expanded T9 vertebra. -MRI of the lumbar spine showed progression of metastasis within the posterior aspect of the L2 vertebral body with extraosseous extension.  Tumor extends into the left lateral aspect of the spinal canal at the L2 vertebral body and into L1-L2 left lateral recess. -I would hold off on his Keytruda. -Brandon Ferguson was given 10 mg IV Decadron in our clinic.  Brandon Ferguson started taking dexamethasone 4 mg tablet this morning. -We have reached out to radiation oncology.  They will plan on treating him  tomorrow. -I plan to see him back in 2 weeks.  Based on his condition at that time, we will discuss palliative care with hospice.  In case his condition improves, Brandon Ferguson will need changing treatment to docetaxel.  2. Brain metastasis: -Continue Keppra 100 mg twice daily.  3. Back pain: -Continue tramadol as needed for back pain.  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  No transfusion needed.   Orders placed this encounter:  No orders of the defined types were placed in this encounter.    Derek Jack, MD [place] [phone]   I, Milinda Antis, am acting as a scribe for Dr. Sanda Linger.  I, Derek Jack MD, have reviewed the above documentation for accuracy and completeness, and I agree with the above.

## 2020-03-07 NOTE — Telephone Encounter (Signed)
I called and tried to speak with the patient but his voicemail was not set up, I was successful however in getting in touch with [name], we had a good conversation today about the concerns seen on his MRI which show compression against the ventral surface of the spinal canal at L2 in the spinal nerve root at L2 on the left, these findings do correlate clinically with his symptoms, and reviewed that Dr. Lisbeth Renshaw has recommended palliative radiotherapy to this location in addition to the palliative SRS treatment that is being planned for his thoracic spine.  We reviewed the risks and benefits side effect profile both long and short-term, and the overall concern for his cancer progressing quite rapidly despite systemic therapy.  I let her know my concerns about his overall prognosis and that I had also brought this to his attention gently yesterday saying that he was a sick patient and he seems to agree with this.  She is also concerned that the patient has not been thriving at home and is looking into skilled facility options for him.  He does not have anybody else who can provide the type of care that she feels he needs at home.  She is interested in meeting with palliative care, and they are scheduled to meet with Dr. Hilma Favors on Tuesday.  I have updated Dr. Hilma Favors as well, and we will follow along expectantly through this process hoping to see improvement neurologically with his case, I reminded Ms. Jimmye Norman of the importance of Mr. Mester taking dexamethasone 4 mg 3 times a day as prescribed.       Carola Rhine, PAC

## 2020-03-07 NOTE — Progress Notes (Signed)
Patient presents today for treatment and follow up visit with [name]. Labs pending. Heart rate on arrival 108. Patient has complaints of back pain today. Patient denies any significant changes since his last visit. Patient's wife states he fell out of the bed. Second fall.   Message received from [place] RN/ [name]. NO TREATMENT TODAY. Infuse Decadron 10mg  IV x 1 dose today. MRI after infusion.   Patient's sister inquired about placing her brother in the [facility] short term. York Spaniel LCSW notified via instant message.   Vital signs stable. No complaints at this time. Discharged from clinic via wheel chair accompanied by sister in stable condition. Alert and oriented x 3. F/U with [facility] as scheduled.

## 2020-03-07 NOTE — Patient Instructions (Signed)
[facility] at [facility]  Discharge Instructions:   _______________________________________________________________  Thank you for choosing [facility] at [facility] to provide your oncology and hematology care.  To afford each patient quality time with our providers, please arrive at least 15 minutes before your scheduled appointment.  You need to re-schedule your appointment if you arrive 10 or more minutes late.  We strive to give you quality time with our providers, and arriving late affects you and other patients whose appointments are after yours.  Also, if you no show three or more times for appointments you may be dismissed from the clinic.  Again, thank you for choosing [facility] at [facility]. Our hope is that these requests will allow you access to exceptional care and in a timely manner. _______________________________________________________________  If you have questions after your visit, please contact our office at [phone] between the hours of 8:30 a.m. and 5:00 p.m. Voicemails left after 4:30 p.m. will not be returned until the following business day. _______________________________________________________________  For prescription refill requests, have your pharmacy contact our office. _______________________________________________________________  Recommendations made by the consultant and any test results will be sent to your referring physician. _______________________________________________________________ 

## 2020-03-07 NOTE — Patient Instructions (Signed)
[place] at [facility] Discharge Instructions  You were seen today by [name]. He went over your recent results. You did not receive treatment today; your chemotherapy will be put on hold while you undergo your radiation therapy. Keep your appointment for your MRI. [name] will see you back in 2 weeks for labs and follow up.   Thank you for choosing [place] at [facility] to provide your oncology and hematology care.  To afford each patient quality time with our provider, please arrive at least 15 minutes before your scheduled appointment time.   If you have a lab appointment with the [facility] please come in thru the Main Entrance and check in at the main information desk  You need to re-schedule your appointment should you arrive 10 or more minutes late.  We strive to give you quality time with our providers, and arriving late affects you and other patients whose appointments are after yours.  Also, if you no show three or more times for appointments you may be dismissed from the clinic at the providers discretion.     Again, thank you for choosing [facility].  Our hope is that these requests will decrease the amount of time that you wait before being seen by our physicians.       _____________________________________________________________  Should you have questions after your visit to [facility], please contact our office at [phone] between the hours of 8:00 a.m. and 4:30 p.m.  Voicemails left after 4:00 p.m. will not be returned until the following business day.  For prescription refill requests, have your pharmacy contact our office and allow 72 hours.    Cancer Center Support Programs:   > Cancer Support Group  2nd Tuesday of the month 1pm-2pm, Journey Room

## 2020-03-08 ENCOUNTER — Ambulatory Visit
Admission: RE | Admit: 2020-03-08 | Discharge: 2020-03-08 | Disposition: A | Discharge status: Home / Self Care | Payer: Medicaid Other | Source: Ambulatory Visit | Attending: Radiation Oncology | Admitting: Radiation Oncology

## 2020-03-08 ENCOUNTER — Encounter: Payer: Self-pay | Admitting: Radiation Oncology

## 2020-03-08 ENCOUNTER — Encounter: Payer: Self-pay | Admitting: General Practice

## 2020-03-08 ENCOUNTER — Other Ambulatory Visit: Payer: Self-pay

## 2020-03-08 DIAGNOSIS — C7931 Secondary malignant neoplasm of brain: Secondary | ICD-10-CM | POA: Diagnosis not present

## 2020-03-08 NOTE — Progress Notes (Addendum)
Brandon Ferguson Progress Notes  Met w patient and family in exam room.  Family describes patient's living situation.  Lives in home they consider dangerous (mold, no ramp, heats w wood, cramped).  Patient is unable to walk on his own, no able bodied caregiver is available to come into the home.  Family and patient are requesting SNF placement due to his current situation as being bedbound.  IN order to get him to appointments, family has to physically lift him down steps and into car.  There is no ramp or wheelchair available for him.  His condition has deteriorated significantly in past two weeks.  Prior to this he was able to ambulate w significant assistance; however, was able to weight bear.  Now he cannot walk at all.  Family requests placement in [facility].  Ferguson called the 4 facilities in this county - only one [facility]) has bed available and will review his documentation.  Facility cites barriers including need for Medicaid prior approval under Medicaid managed care plans and the need for EMS transport to/from [facility] appointments.  Adult Protective Services may need to be involved as patient is a disabled adult w no willing caregiver available to him.  All family members express that they are unable to bring him into their homes or provide consistent supervision/transportation for him.  Family expresses their belief that this is an urgent situation.  Ferguson described the process of generating options for patient as well as the possibility of APS involvement.  Messages Ashok Cordia, PA, provider.  Also gave FL2 to [facility] and requested that it be completed as this is part of the placement process.  Unfortunately, it is unlikely that placement can be found today on the outpatient basis despite the critical unmet needs of the patient.  Thus APS will likely need to be involved.    Edwyna Shell, LCSW Clinical Social Worker Phone:  [phone]

## 2020-03-08 NOTE — Progress Notes (Signed)
[place] CSW Progress Notes  Adult Protective Serivces report made to [employer] [phone]).  Report made and per worker, is accepted.  Provided contact information for family members and patient. If long term placement is needed, DSS will work on application for long term Medicaid.  Patient does have managed Medicaid w [employer] [phone]).  Convalescent transport can be arranged to/from home to [facility] appointments for daily radiation.  First trip has been scheduled for tomorrow 10/1 (pick up at his home at 11:15-11:45, patient will need to call for pick up, conf # [id number]).    Edwyna Shell, LCSW Clinical Social Worker Phone:  [phone] Cell:  [phone]

## 2020-03-08 NOTE — Progress Notes (Signed)
[place] CSW Progress Notes  Patient has [place] transportation arranged w [facility] [phone]) for convalescent BLS transport to/from oncology appointments as follows:  10/1 - pick up at home 11:30 AM, pick up at [facility] 1:45 10/4 - pick up at home 11 AM, pick up at [facility] 1:30 10/5:  Pick up at home 1:45, pick up at [facility] 5:00 10/6:  Pick up at home 11:30, pick up at [facility] 2:00  10/7: Pick up at home 10:15, pick up at [facility] 12:45 10/8:  Pick up at home 12:30, pick up at [facility] 3:00 10/11:  Pick up at home 11:30, pick up at [facility] 5:00 10/12:  Pick up at home 1:30, pick up at [facility] 4:00 10/13:  Pick up at home 1:30, pick up at [facility] 4:00 10/14:  Pick up at home 7:40, pick up at [facility] 3:00 PM  Please call scheduling at [facility] with any changes/cancellations in order to reschedule these transportation arrangements.  Family and patient are aware.  Edwyna Shell, LCSW Clinical Social Worker Phone:  [phone] Cell:  [phone]

## 2020-03-08 NOTE — Progress Notes (Addendum)
The patient was seen today in our clinic and his two sisters accompanied him today. I reviewed our conversation with his two sisters about the MRI findings and concerns for impending cord compression. He has his prescription for Dexamethasone 4 mg to take TID, and will start this today since he received 10 mg yesterday at Dr. Tomie China office. 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 By exam his strength has improved and is 3/5 in the LLE. His RLE is 5/5. His family has been trying to research placement for him. Right now however he remains bed bound at home. It is difficult to estimate his recovery and what he will be able to do long term as he has an incurable advanced metastatic lung cancer. He will meet with Dr. Hilma Favors with palliative care next week. Hopefully social work can try to see what options he may have for a residential facility placement. Edwyna Shell is aware of his case and will come to the department today since he will be with [place] all day after planning his L2 treatment and to receive it this afternoon. The sisters are aware that we cannot admit him, and that following treatment, they would have to take him home as we do not anticipate placement happening today.     Carola Rhine, PAC

## 2020-03-08 NOTE — Progress Notes (Signed)
[place] CSW Progress Notes  Call to [place] [employer] [phone]).  Left VM w request for a call back.  Edwyna Shell, LCSW Clinical Social Worker Phone:  [phone] Cell:  [phone]

## 2020-03-09 ENCOUNTER — Encounter: Payer: Self-pay | Admitting: General Practice

## 2020-03-09 ENCOUNTER — Ambulatory Visit: Payer: Medicaid Other

## 2020-03-09 NOTE — Progress Notes (Signed)
[place] CSW Progress Notes  Melissa, admissions at [facility], confirms they have received his referral packet and it is being reviewed by Director of Nursing.  Sandrea Matte, [place] DSS [facility] [employer], calls to confirm case and been accepted by APS and she is the assigned worker.  Edwyna Shell, LCSW Clinical Social Worker Phone:  [phone]

## 2020-03-09 NOTE — Progress Notes (Signed)
[place] CSW Progress Notes  Patient was not picked up today by [facility] as arranged.  He was to be picked up at his home at 11:15-11:45, patient will need to call for pick up, conf # [id number].  Per [place], they did not understand that todays trip was an urgent trip - "it was denied because it was not considered urgent for radiology, denied trip for lack of notice."  CSW stressed that this was for cancer treatment, it is urgent.  Trip type has been changed to "urgent for cancer treatment."  [facility] states that they will seek BLS convalescent transport for rides for all upcoming radiation appointments.  They will attempt to transport for Monday's appointment as scheduled.   Edwyna Shell, LCSW Clinical Social Worker Phone:  [phone]

## 2020-03-10 ENCOUNTER — Emergency Department (HOSPITAL_COMMUNITY): Payer: Medicaid Other

## 2020-03-10 ENCOUNTER — Emergency Department (HOSPITAL_COMMUNITY)
Admission: EM | Admit: 2020-03-10 | Discharge: 2020-03-10 | Disposition: A | Discharge status: Home / Self Care | Payer: Medicaid Other | Source: Home / Self Care | Attending: Emergency Medicine | Admitting: Emergency Medicine

## 2020-03-10 ENCOUNTER — Other Ambulatory Visit: Payer: Self-pay

## 2020-03-10 ENCOUNTER — Encounter (HOSPITAL_COMMUNITY): Payer: Self-pay

## 2020-03-10 DIAGNOSIS — K5900 Constipation, unspecified: Secondary | ICD-10-CM | POA: Insufficient documentation

## 2020-03-10 DIAGNOSIS — Z87891 Personal history of nicotine dependence: Secondary | ICD-10-CM | POA: Insufficient documentation

## 2020-03-10 MED ORDER — GOLYTELY 236 G PO SOLR: ORAL | 0 refills | Status: DC

## 2020-03-10 NOTE — ED Provider Notes (Signed)
[facility] EMERGENCY DEPARTMENT Provider Note   CSN: [id number] Arrival date & time: 03/10/20  3329     History Chief Complaint  Patient presents with  . Constipation    Patient reports not having BM in five days. Currently undergoing chemo tx for spinal cancer. Denies abdominal pain, N/V.   [name] Kitchen Weakness    Reports generalized weakness and inability to ambulate.   . Allergic Reaction    Brandon Ferguson is a 60 y.o. male.  Patient complains of constipation. He states he is not have a bowel movement 5-day. No abdominal pain  The history is provided by the patient and medical records. No language interpreter was used.  Constipation Severity:  Moderate Timing:  Constant Progression:  Worsening Chronicity:  New Context: dehydration   Stool description:  None produced Relieved by:  Nothing Worsened by:  Nothing Associated symptoms: no abdominal pain, no back pain and no diarrhea   Risk factors: no change in medication   Weakness Associated symptoms: no abdominal pain, no chest pain, no cough, no diarrhea, no frequency, no headaches and no seizures   Allergic Reaction Presenting symptoms: no rash        Past Medical History:  Diagnosis Date  . Cancer ([place])   . GERD (gastroesophageal reflux disease)   . Iron deficiency   . Port-A-Cath in place 06/17/2019  . Vitamin D deficiency     Patient Active Problem List   Diagnosis Date Noted  . Bone metastasis ([place]) 08/11/2019  . Port-A-Cath in place 06/17/2019  . Goals of care, counseling/discussion 06/16/2019  . Malignant neoplasm of right upper lobe of lung ([place])   . Lung mass 04/28/2019  . Metastatic cancer to brain ([place]) 04/27/2019  . Hypokalemia 04/27/2019    Past Surgical History:  Procedure Laterality Date  . KNEE SURGERY    . PORTACATH PLACEMENT Left 06/29/2019   Procedure: INSERTION PORT-A-CATH;  Surgeon: Aviva Signs, MD;  Location: [facility] ORS;  Service: General;  Laterality: Left;  [name] Kitchen VIDEO BRONCHOSCOPY Bilateral  04/29/2019   Procedure: VIDEO BRONCHOSCOPY WITH FLUORO;  Surgeon: Laurin Coder, MD;  Location: [facility] ENDOSCOPY;  Service: Endoscopy;  Laterality: Bilateral;  . VIDEO BRONCHOSCOPY Bilateral 05/03/2019   Procedure: VIDEO BRONCHOSCOPY WITHOUT FLUORO;  Surgeon: Rigoberto Noel, MD;  Location: [facility];  Service: Cardiopulmonary;  Laterality: Bilateral;       Family History  Problem Relation Age of Onset  . Breast cancer Mother   . Cirrhosis Father   . Alcohol abuse Father   . Hypertension Sister   . Cancer Brother   . Hypertension Sister   . Hypertension Sister   . Hypertension Sister   . Hypertension Sister     Social History   Tobacco Use  . Smoking status: Former Smoker    Packs/day: 1.00  . Smokeless tobacco: Never Used  . Tobacco comment: quit cigs 5 months ago  Vaping Use  . Vaping Use: Never used  Substance Use Topics  . Alcohol use: Not Currently    Comment: pt drinks 3 large beers daily. hasnt had any in 5 months  . Drug use: Not Currently    Types: Marijuana    Comment: daily. as of Jan 18,2021 hasnt had any in 5 months    Home Medications Prior to Admission medications   Medication Sig Start Date End Date Taking? Authorizing Provider  CARBOPLATIN IV Inject into the vein every 21 ( twenty-one) days. Every 3 weeks x 4 cycles 07/05/19   [provider]  dexamethasone (DECADRON) 4 MG tablet Take 1 tablet (4 mg total) by mouth 3 (three) times daily. 03/06/20   Hayden Pedro, PA-C  ferrous sulfate 325 (65 FE) MG EC tablet Take 325 mg by mouth daily.    [provider]  folic acid (FOLVITE) 1 MG tablet Take 1 tablet (1 mg total) by mouth daily. 11/14/19   Derek Jack, MD  HYDROcodone-acetaminophen (NORCO) 5-325 MG tablet Take one tablet po q hs prn pain, or up to every 6 hours as needed. 02/22/20   Hayden Pedro, PA-C  levETIRAcetam (KEPPRA) 500 MG tablet Take 1 tablet (500 mg total) by mouth 2 (two) times daily. 11/14/19    Ventura Sellers, MD  lidocaine-prilocaine (EMLA) cream Apply a small amount to port a cath site and cover with plastic wrap 1 hour prior to chemotherapy appointments 06/21/19   Derek Jack, MD  magnesium oxide (MAG-OX) 400 (241.3 Mg) MG tablet Take 1 tablet (400 mg total) by mouth 2 (two) times daily. 09/09/19   Derek Jack, MD  pantoprazole (PROTONIX) 40 MG tablet Take 40 mg by mouth daily. 02/22/20   [provider]  PEMBROLIZUMAB IV Inject into the vein every 21 ( twenty-one) days. 07/05/19   [provider]  PEMEtrexed 500 mg/m2 in sodium chloride 0.9 % 100 mL Inject 500 mg/m2 into the vein every 21 ( twenty-one) days. Every 3 weeks x 4 cycles 07/05/19   [provider]  polyethylene glycol (GOLYTELY) 236 g solution Drink 8 ounce glass of GoLYTELY every hour until you have a bowel movement 03/10/20   Milton Ferguson, MD  prochlorperazine (COMPAZINE) 10 MG tablet Take 1 tablet (10 mg total) by mouth every 6 (six) hours as needed (Nausea or vomiting). 06/21/19   Derek Jack, MD    Allergies    Patient has no known allergies.  Review of Systems   Review of Systems  Constitutional: Negative for appetite change and fatigue.  HENT: Negative for congestion, ear discharge and sinus pressure.   Eyes: Negative for discharge.  Respiratory: Negative for cough.   Cardiovascular: Negative for chest pain.  Gastrointestinal: Positive for constipation. Negative for abdominal pain and diarrhea.  Genitourinary: Negative for frequency and hematuria.  Musculoskeletal: Negative for back pain.  Skin: Negative for rash.  Neurological: Positive for weakness. Negative for seizures and headaches.  Psychiatric/Behavioral: Negative for hallucinations.    Physical Exam Updated Vital Signs BP 113/73   Pulse 89   Temp 97.9 F (36.6 C)   Ht 6' (1.829 m)   Wt 59.9 kg   SpO2 100%   BMI 17.90 kg/m   Physical Exam Vitals and nursing note reviewed.   Constitutional:      Appearance: He is well-developed.  HENT:     Head: Normocephalic.     Nose: Nose normal.  Eyes:     General: No scleral icterus.    Conjunctiva/sclera: Conjunctivae normal.  Neck:     Thyroid: No thyromegaly.  Cardiovascular:     Rate and Rhythm: Normal rate and regular rhythm.     Heart sounds: No murmur heard.  No friction rub. No gallop.   Pulmonary:     Breath sounds: No stridor. No wheezing or rales.  Chest:     Chest wall: No tenderness.  Abdominal:     General: There is no distension.     Tenderness: There is no abdominal tenderness. There is no rebound.  Musculoskeletal:        General: Normal range of  motion.     Cervical back: Neck supple.  Lymphadenopathy:     Cervical: No cervical adenopathy.  Skin:    Findings: No erythema or rash.  Neurological:     Mental Status: He is alert and oriented to person, place, and time.     Motor: No abnormal muscle tone.     Coordination: Coordination normal.  Psychiatric:        Behavior: Behavior normal.     ED Results / Procedures / Treatments   Labs (all labs ordered are listed, but only abnormal results are displayed) Labs Reviewed - No data to display  EKG None  Radiology DG Abdomen 1 View  Result Date: 03/10/2020 CLINICAL DATA:  Constipation. EXAM: ABDOMEN - 1 VIEW COMPARISON:  None. FINDINGS: No abnormal bowel dilatation is noted. Large amount of stool seen throughout the colon. No radio-opaque calculi or other significant radiographic abnormality are seen. IMPRESSION: Large stool burden. No evidence of bowel obstruction or ileus. Electronically Signed   By: Marijo Conception M.D.   On: 03/10/2020 11:36    Procedures Procedures (including critical care time)  Medications Ordered in ED Medications - No data to display  ED Course  I have reviewed the triage vital signs and the nursing notes.  Pertinent labs & imaging results that were available during my care of the patient were  reviewed by me and considered in my medical decision making (see chart for details).    MDM Rules/Calculators/A&P                          Patient with constipation seen on x-ray. He will be sent home with GoLYTELY and follow-up with his PCP     This patient presents to the ED for concern of constipation, this involves an extensive number of treatment options, and is a complaint that carries with it a high risk of complications and morbidity.  The differential diagnosis includes constipation   Lab Tests:     Medicines ordered:   I ordered medication GoLYTELY  Imaging Studies ordered:   I ordered imaging studies which included abdominal films  I independently visualized and interpreted imaging which showed constipation  Additional history obtained:   Additional history obtained from record  Previous records obtained and reviewed.  Consultations Obtained:     Reevaluation:  After the interventions stated above, I reevaluated the patient and found no change  Critical Interventions:  .   Final Clinical Impression(s) / ED Diagnoses Final diagnoses:  None    Rx / DC Orders ED Discharge Orders         Ordered    polyethylene glycol (GOLYTELY) 236 g solution        03/10/20 1347           Milton Ferguson, MD 03/10/20 1407

## 2020-03-10 NOTE — Discharge Instructions (Addendum)
Get the medicine go likely go ahead and start drinking one 8 ounce glass every hour until you have a bowel movement.  Follow-up with your doctor if any problems.  Drink plenty of fluids

## 2020-03-10 NOTE — ED Provider Notes (Signed)
[facility] EMERGENCY DEPARTMENT Provider Note   CSN: [id number] Arrival date & time: 03/10/20  1856     History Chief Complaint  Patient presents with  . Constipation    Patient reports not having BM in five days. Currently undergoing chemo tx for spinal cancer. Denies abdominal pain, N/V.   [name] Kitchen Weakness    Reports generalized weakness and inability to ambulate.   . Allergic Reaction    Brandon Ferguson is a 59 y.o. male.  Patient complains of constipation. No abdominal pain. This been going on for 5 days  The history is provided by the patient and medical records. No language interpreter was used.  Constipation Severity:  Moderate Timing:  Constant Progression:  Worsening Chronicity:  New Context: not dehydration   Stool description:  None produced Relieved by:  Nothing Worsened by:  Nothing Ineffective treatments:  None tried Associated symptoms: no abdominal pain, no back pain and no diarrhea   Weakness Associated symptoms: no abdominal pain, no chest pain, no cough, no diarrhea, no frequency, no headaches and no seizures   Allergic Reaction Presenting symptoms: no rash        Past Medical History:  Diagnosis Date  . Cancer ([place])   . GERD (gastroesophageal reflux disease)   . Iron deficiency   . Port-A-Cath in place 06/17/2019  . Vitamin D deficiency     Patient Active Problem List   Diagnosis Date Noted  . Bone metastasis ([place]) 08/11/2019  . Port-A-Cath in place 06/17/2019  . Goals of care, counseling/discussion 06/16/2019  . Malignant neoplasm of right upper lobe of lung ([place])   . Lung mass 04/28/2019  . Metastatic cancer to brain ([place]) 04/27/2019  . Hypokalemia 04/27/2019    Past Surgical History:  Procedure Laterality Date  . KNEE SURGERY    . PORTACATH PLACEMENT Left 06/29/2019   Procedure: INSERTION PORT-A-CATH;  Surgeon: Aviva Signs, MD;  Location: [facility];  Service: General;  Laterality: Left;  [name] Kitchen VIDEO BRONCHOSCOPY Bilateral 04/29/2019    Procedure: VIDEO BRONCHOSCOPY WITH FLUORO;  Surgeon: Laurin Coder, MD;  Location: [facility];  Service: Endoscopy;  Laterality: Bilateral;  . VIDEO BRONCHOSCOPY Bilateral 05/03/2019   Procedure: VIDEO BRONCHOSCOPY WITHOUT FLUORO;  Surgeon: Rigoberto Noel, MD;  Location: [facility];  Service: Cardiopulmonary;  Laterality: Bilateral;       Family History  Problem Relation Age of Onset  . Breast cancer Mother   . Cirrhosis Father   . Alcohol abuse Father   . Hypertension Sister   . Cancer Brother   . Hypertension Sister   . Hypertension Sister   . Hypertension Sister   . Hypertension Sister     Social History   Tobacco Use  . Smoking status: Former Smoker    Packs/day: 1.00  . Smokeless tobacco: Never Used  . Tobacco comment: quit cigs 5 months ago  Vaping Use  . Vaping Use: Never used  Substance Use Topics  . Alcohol use: Not Currently    Comment: pt drinks 3 large beers daily. hasnt had any in 5 months  . Drug use: Not Currently    Types: Marijuana    Comment: daily. as of Jan 18,2021 hasnt had any in 5 months    Home Medications Prior to Admission medications   Medication Sig Start Date End Date Taking? Authorizing Provider  CARBOPLATIN IV Inject into the vein every 21 ( twenty-one) days. Every 3 weeks x 4 cycles 07/05/19   [provider]  dexamethasone (DECADRON) 4 MG  tablet Take 1 tablet (4 mg total) by mouth 3 (three) times daily. 03/06/20   Hayden Pedro, PA-C  ferrous sulfate 325 (65 FE) MG EC tablet Take 325 mg by mouth daily.    [provider]  folic acid (FOLVITE) 1 MG tablet Take 1 tablet (1 mg total) by mouth daily. 11/14/19   Derek Jack, MD  HYDROcodone-acetaminophen (NORCO) 5-325 MG tablet Take one tablet po q hs prn pain, or up to every 6 hours as needed. 02/22/20   Hayden Pedro, PA-C  levETIRAcetam (KEPPRA) 500 MG tablet Take 1 tablet (500 mg total) by mouth 2 (two) times daily. 11/14/19   Ventura Sellers, MD  lidocaine-prilocaine (EMLA) cream Apply a small amount to port a cath site and cover with plastic wrap 1 hour prior to chemotherapy appointments 06/21/19   Derek Jack, MD  magnesium oxide (MAG-OX) 400 (241.3 Mg) MG tablet Take 1 tablet (400 mg total) by mouth 2 (two) times daily. 09/09/19   Derek Jack, MD  pantoprazole (PROTONIX) 40 MG tablet Take 40 mg by mouth daily. 02/22/20   [provider]  PEMBROLIZUMAB IV Inject into the vein every 21 ( twenty-one) days. 07/05/19   [provider]  PEMEtrexed 500 mg/m2 in sodium chloride 0.9 % 100 mL Inject 500 mg/m2 into the vein every 21 ( twenty-one) days. Every 3 weeks x 4 cycles 07/05/19   [provider]  polyethylene glycol (GOLYTELY) 236 g solution Drink 8 ounce glass of GoLYTELY every hour until you have a bowel movement 03/10/20   Milton Ferguson, MD  prochlorperazine (COMPAZINE) 10 MG tablet Take 1 tablet (10 mg total) by mouth every 6 (six) hours as needed (Nausea or vomiting). 06/21/19   Derek Jack, MD    Allergies    Patient has no known allergies.  Review of Systems   Review of Systems  Constitutional: Negative for appetite change and fatigue.  HENT: Negative for congestion, ear discharge and sinus pressure.   Eyes: Negative for discharge.  Respiratory: Negative for cough.   Cardiovascular: Negative for chest pain.  Gastrointestinal: Positive for constipation. Negative for abdominal pain and diarrhea.  Genitourinary: Negative for frequency and hematuria.  Musculoskeletal: Negative for back pain.  Skin: Negative for rash.  Neurological: Positive for weakness. Negative for seizures and headaches.  Psychiatric/Behavioral: Negative for hallucinations.    Physical Exam Updated Vital Signs BP 107/65   Pulse 88   Temp 97.9 F (36.6 C)   Ht 6' (1.829 m)   Wt 59.9 kg   SpO2 100%   BMI 17.90 kg/m   Physical Exam Vitals and nursing note reviewed.  Constitutional:       Appearance: He is well-developed.  HENT:     Head: Normocephalic.     Nose: Nose normal.  Eyes:     General: No scleral icterus.    Conjunctiva/sclera: Conjunctivae normal.  Neck:     Thyroid: No thyromegaly.  Cardiovascular:     Rate and Rhythm: Normal rate and regular rhythm.     Heart sounds: No murmur heard.  No friction rub. No gallop.   Pulmonary:     Breath sounds: No stridor. No wheezing or rales.  Chest:     Chest wall: No tenderness.  Abdominal:     General: There is no distension.     Tenderness: There is no abdominal tenderness. There is no rebound.  Musculoskeletal:        General: Normal range of motion.  Cervical back: Neck supple.  Lymphadenopathy:     Cervical: No cervical adenopathy.  Skin:    Findings: No erythema or rash.  Neurological:     Mental Status: He is oriented to person, place, and time.     Motor: No abnormal muscle tone.     Coordination: Coordination normal.  Psychiatric:        Behavior: Behavior normal.     ED Results / Procedures / Treatments   Labs (all labs ordered are listed, but only abnormal results are displayed) Labs Reviewed - No data to display  EKG None  Radiology DG Abdomen 1 View  Result Date: 03/10/2020 CLINICAL DATA:  Constipation. EXAM: ABDOMEN - 1 VIEW COMPARISON:  None. FINDINGS: No abnormal bowel dilatation is noted. Large amount of stool seen throughout the colon. No radio-opaque calculi or other significant radiographic abnormality are seen. IMPRESSION: Large stool burden. No evidence of bowel obstruction or ileus. Electronically Signed   By: Marijo Conception M.D.   On: 03/10/2020 11:36    Procedures Procedures (including critical care time)  Medications Ordered in ED Medications - No data to display  ED Course  I have reviewed the triage vital signs and the nursing notes.  Pertinent labs & imaging results that were available during my care of the patient were reviewed by me and considered in my  medical decision making (see chart for details).    MDM Rules/Calculators/A&P                          Abdominal films show constipation. Patient will be sent home with GoLYTELY and have him drink one 8 ounce glass of GoLYTELY until he has a bowel movement Final Clinical Impression(s) / ED Diagnoses Final diagnoses:  None    Rx / DC Orders ED Discharge Orders         Ordered    polyethylene glycol (GOLYTELY) 236 g solution        03/10/20 1347           Milton Ferguson, MD 03/10/20 1418

## 2020-03-11 ENCOUNTER — Inpatient Hospital Stay (HOSPITAL_COMMUNITY)
Admission: EM | Admit: 2020-03-11 | Discharge: 2020-03-23 | DRG: 543 | Disposition: A | Discharge status: Skilled Nursing Facility | Payer: Medicaid Other | Attending: Internal Medicine | Admitting: Internal Medicine

## 2020-03-11 ENCOUNTER — Encounter (HOSPITAL_COMMUNITY): Payer: Self-pay

## 2020-03-11 ENCOUNTER — Other Ambulatory Visit: Payer: Self-pay

## 2020-03-11 DIAGNOSIS — R64 Cachexia: Secondary | ICD-10-CM | POA: Diagnosis not present

## 2020-03-11 DIAGNOSIS — Z811 Family history of alcohol abuse and dependence: Secondary | ICD-10-CM

## 2020-03-11 DIAGNOSIS — K5909 Other constipation: Secondary | ICD-10-CM | POA: Diagnosis present

## 2020-03-11 DIAGNOSIS — Z79899 Other long term (current) drug therapy: Secondary | ICD-10-CM

## 2020-03-11 DIAGNOSIS — L89152 Pressure ulcer of sacral region, stage 2: Secondary | ICD-10-CM | POA: Diagnosis present

## 2020-03-11 DIAGNOSIS — Z66 Do not resuscitate: Secondary | ICD-10-CM | POA: Diagnosis not present

## 2020-03-11 DIAGNOSIS — C7931 Secondary malignant neoplasm of brain: Secondary | ICD-10-CM | POA: Diagnosis present

## 2020-03-11 DIAGNOSIS — D630 Anemia in neoplastic disease: Secondary | ICD-10-CM | POA: Diagnosis not present

## 2020-03-11 DIAGNOSIS — Z87891 Personal history of nicotine dependence: Secondary | ICD-10-CM | POA: Diagnosis not present

## 2020-03-11 DIAGNOSIS — Z8249 Family history of ischemic heart disease and other diseases of the circulatory system: Secondary | ICD-10-CM | POA: Diagnosis not present

## 2020-03-11 DIAGNOSIS — G834 Cauda equina syndrome: Secondary | ICD-10-CM | POA: Diagnosis present

## 2020-03-11 DIAGNOSIS — E559 Vitamin D deficiency, unspecified: Secondary | ICD-10-CM | POA: Diagnosis not present

## 2020-03-11 DIAGNOSIS — Z681 Body mass index (BMI) 19 or less, adult: Secondary | ICD-10-CM | POA: Diagnosis not present

## 2020-03-11 DIAGNOSIS — G8220 Paraplegia, unspecified: Secondary | ICD-10-CM | POA: Diagnosis not present

## 2020-03-11 DIAGNOSIS — Z803 Family history of malignant neoplasm of breast: Secondary | ICD-10-CM

## 2020-03-11 DIAGNOSIS — D649 Anemia, unspecified: Secondary | ICD-10-CM

## 2020-03-11 DIAGNOSIS — L8990 Pressure ulcer of unspecified site, unspecified stage: Secondary | ICD-10-CM | POA: Insufficient documentation

## 2020-03-11 DIAGNOSIS — K219 Gastro-esophageal reflux disease without esophagitis: Secondary | ICD-10-CM | POA: Diagnosis present

## 2020-03-11 DIAGNOSIS — Z20822 Contact with and (suspected) exposure to covid-19: Secondary | ICD-10-CM | POA: Diagnosis present

## 2020-03-11 DIAGNOSIS — C3411 Malignant neoplasm of upper lobe, right bronchus or lung: Secondary | ICD-10-CM | POA: Diagnosis present

## 2020-03-11 DIAGNOSIS — M8458XA Pathological fracture in neoplastic disease, other specified site, initial encounter for fracture: Secondary | ICD-10-CM | POA: Diagnosis present

## 2020-03-11 DIAGNOSIS — C7951 Secondary malignant neoplasm of bone: Principal | ICD-10-CM | POA: Diagnosis present

## 2020-03-11 DIAGNOSIS — C799 Secondary malignant neoplasm of unspecified site: Secondary | ICD-10-CM | POA: Diagnosis not present

## 2020-03-11 DIAGNOSIS — K5900 Constipation, unspecified: Secondary | ICD-10-CM | POA: Diagnosis not present

## 2020-03-11 DIAGNOSIS — Z79891 Long term (current) use of opiate analgesic: Secondary | ICD-10-CM

## 2020-03-11 LAB — CBC WITH DIFFERENTIAL/PLATELET
Abs Immature Granulocytes: 0.16 10*3/uL — ABNORMAL HIGH (ref 0.00–0.07)
Basophils Absolute: 0 10*3/uL (ref 0.0–0.1)
Basophils Relative: 0 %
Eosinophils Absolute: 0.1 10*3/uL (ref 0.0–0.5)
Eosinophils Relative: 0 %
HCT: 25 % — ABNORMAL LOW (ref 39.0–52.0)
Hemoglobin: 7.5 g/dL — ABNORMAL LOW (ref 13.0–17.0)
Immature Granulocytes: 1 %
Lymphocytes Relative: 7 %
Lymphs Abs: 1 10*3/uL (ref 0.7–4.0)
MCH: 25.6 pg — ABNORMAL LOW (ref 26.0–34.0)
MCHC: 30 g/dL (ref 30.0–36.0)
MCV: 85.3 fL (ref 80.0–100.0)
Monocytes Absolute: 1.1 10*3/uL — ABNORMAL HIGH (ref 0.1–1.0)
Monocytes Relative: 8 %
Neutro Abs: 11.1 10*3/uL — ABNORMAL HIGH (ref 1.7–7.7)
Neutrophils Relative %: 84 %
Platelets: 208 10*3/uL (ref 150–400)
RBC: 2.93 MIL/uL — ABNORMAL LOW (ref 4.22–5.81)
RDW: 19.9 % — ABNORMAL HIGH (ref 11.5–15.5)
WBC: 13.4 10*3/uL — ABNORMAL HIGH (ref 4.0–10.5)
nRBC: 0 % (ref 0.0–0.2)

## 2020-03-11 LAB — RESPIRATORY PANEL BY RT PCR (FLU A&B, COVID)
Influenza A by PCR: NEGATIVE
Influenza B by PCR: NEGATIVE
SARS Coronavirus 2 by RT PCR: NEGATIVE

## 2020-03-11 LAB — COMPREHENSIVE METABOLIC PANEL
ALT: 17 U/L (ref 0–44)
AST: 23 U/L (ref 15–41)
Albumin: 2.9 g/dL — ABNORMAL LOW (ref 3.5–5.0)
Alkaline Phosphatase: 322 U/L — ABNORMAL HIGH (ref 38–126)
Anion gap: 13 (ref 5–15)
BUN: 17 mg/dL (ref 6–20)
CO2: 25 mmol/L (ref 22–32)
Calcium: 9.9 mg/dL (ref 8.9–10.3)
Chloride: 95 mmol/L — ABNORMAL LOW (ref 98–111)
Creatinine, Ser: 0.4 mg/dL — ABNORMAL LOW (ref 0.61–1.24)
GFR calc Af Amer: >60 mL/min (ref >60)
GFR calc non Af Amer: >60 mL/min (ref >60)
Glucose, Bld: 103 mg/dL — ABNORMAL HIGH (ref 70–99)
Potassium: 3.5 mmol/L (ref 3.5–5.1)
Sodium: 133 mmol/L — ABNORMAL LOW (ref 135–145)
Total Bilirubin: 0.8 mg/dL (ref 0.3–1.2)
Total Protein: 8 g/dL (ref 6.5–8.1)

## 2020-03-11 MED ORDER — DEXAMETHASONE SODIUM PHOSPHATE 10 MG/ML IJ SOLN
10.0000 mg | Freq: Once | INTRAMUSCULAR | Status: AC
  Administered 2020-03-11: 10 mg via INTRAVENOUS
  Filled 2020-03-11: qty 1

## 2020-03-11 MED ORDER — POLYETHYLENE GLYCOL 3350 17 G PO PACK
17.0000 g | PACK | Freq: Two times a day (BID) | ORAL | Status: DC
  Administered 2020-03-11 – 2020-03-14 (×6): 17 g via ORAL
  Filled 2020-03-11 (×6): qty 1

## 2020-03-11 MED ORDER — DEXAMETHASONE 4 MG PO TABS
4.0000 mg | ORAL_TABLET | Freq: Three times a day (TID) | ORAL | Status: DC
  Administered 2020-03-12 – 2020-03-23 (×34): 4 mg via ORAL
  Filled 2020-03-11 (×34): qty 1

## 2020-03-11 MED ORDER — SENNOSIDES-DOCUSATE SODIUM 8.6-50 MG PO TABS
2.0000 | ORAL_TABLET | Freq: Every day | ORAL | Status: DC
  Administered 2020-03-11 – 2020-03-22 (×12): 2 via ORAL
  Filled 2020-03-11 (×12): qty 2

## 2020-03-11 MED ORDER — POTASSIUM CHLORIDE IN NACL 40-0.9 MEQ/L-% IV SOLN
INTRAVENOUS | Status: DC
  Filled 2020-03-11 (×2): qty 1000

## 2020-03-11 MED ORDER — MAGNESIUM CITRATE PO SOLN
1.0000 | Freq: Once | ORAL | Status: AC
  Administered 2020-03-11: 1 via ORAL
  Filled 2020-03-11: qty 296

## 2020-03-11 MED ORDER — LEVETIRACETAM 500 MG PO TABS
500.0000 mg | ORAL_TABLET | Freq: Two times a day (BID) | ORAL | Status: DC
  Administered 2020-03-11 – 2020-03-23 (×24): 500 mg via ORAL
  Filled 2020-03-11 (×24): qty 1

## 2020-03-11 MED ORDER — HEPARIN SODIUM (PORCINE) 5000 UNIT/ML IJ SOLN
5000.0000 [IU] | Freq: Three times a day (TID) | INTRAMUSCULAR | Status: DC
  Administered 2020-03-11 – 2020-03-23 (×34): 5000 [IU] via SUBCUTANEOUS
  Filled 2020-03-11 (×35): qty 1

## 2020-03-11 MED ORDER — HYDROCODONE-ACETAMINOPHEN 5-325 MG PO TABS: 1.0000 | ORAL_TABLET | Freq: Three times a day (TID) | ORAL | Status: DC | PRN

## 2020-03-11 MED ORDER — ACETAMINOPHEN 325 MG PO TABS
650.0000 mg | ORAL_TABLET | Freq: Four times a day (QID) | ORAL | Status: DC | PRN
  Administered 2020-03-12 – 2020-03-22 (×14): 650 mg via ORAL
  Filled 2020-03-11 (×14): qty 2

## 2020-03-11 MED ORDER — ACETAMINOPHEN 650 MG RE SUPP: 650.0000 mg | Freq: Four times a day (QID) | RECTAL | Status: DC | PRN

## 2020-03-11 NOTE — ED Triage Notes (Signed)
Police called to residence by pts sister. Advised he stated he was going to blow his head off. When police got there he was in bed and didn't want them to come in. Room searched no gun found, but then said he was going to take pills. Per officer he stated multiple times he was going to kill himself. Pt upset girlfriend left him and he is confined to bed

## 2020-03-11 NOTE — ED Provider Notes (Signed)
[facility] EMERGENCY DEPARTMENT Provider Note   CSN: [id number] Arrival date & time: 03/11/20  1533     History Chief Complaint  Patient presents with  . V70.1    Brandon Ferguson is a 59 y.o. male.  HPI   This patient is a 59 year old male, known history of metastatic malignant cancer of his lung, it has metastasized to his brain as well as his spine and multiple osseous metastases across his body.  He is currently being followed by oncology with Dr. Worthy Keeler, and visit from September 29 he was to undergo MRI of his lumbar spine, consultation with radiation oncology and potentially palliative with hospice care.  The patient presented to the hospital yesterday with a complaint of constipation.  He was prescribed GoLYTELY which she states he did not pick up.  He still continues to have abdominal constipation.  He did have an x-ray that showed a large stool burden throughout his colon yesterday.  The patient states that he was in an argument with his girlfriend, he evidently is being cared for by the girlfriend due to his needs of not being able to walk and having incontinence which is progressive.  At the end of this argument he called his sister and said that he was going to shoot himself with a gun and take an overdose of pills.  The paramedics and the police officer arrived on scene, he continued to endorse these thoughts.  The patient tells me the only reason he said these things was to get a ride to the hospital to be evaluated for the constipation.  He states he has ongoing abdominal discomfort which has not changed, there is no nausea or vomiting, no fevers or chills.  He does endorse having weakness and numbness of his legs and urinary incontinence.  He states he cannot walk  Past Medical History:  Diagnosis Date  . Cancer ([place])   . GERD (gastroesophageal reflux disease)   . Iron deficiency   . Port-A-Cath in place 06/17/2019  . Vitamin D deficiency     Patient Active Problem  List   Diagnosis Date Noted  . Bone metastasis ([place]) 08/11/2019  . Port-A-Cath in place 06/17/2019  . Goals of care, counseling/discussion 06/16/2019  . Malignant neoplasm of right upper lobe of lung ([place])   . Lung mass 04/28/2019  . Metastatic cancer to brain ([place]) 04/27/2019  . Hypokalemia 04/27/2019    Past Surgical History:  Procedure Laterality Date  . KNEE SURGERY    . PORTACATH PLACEMENT Left 06/29/2019   Procedure: INSERTION PORT-A-CATH;  Surgeon: Aviva Signs, MD;  Location: [facility];  Service: General;  Laterality: Left;  [name] Kitchen VIDEO BRONCHOSCOPY Bilateral 04/29/2019   Procedure: VIDEO BRONCHOSCOPY WITH FLUORO;  Surgeon: Laurin Coder, MD;  Location: [facility];  Service: Endoscopy;  Laterality: Bilateral;  . VIDEO BRONCHOSCOPY Bilateral 05/03/2019   Procedure: VIDEO BRONCHOSCOPY WITHOUT FLUORO;  Surgeon: Rigoberto Noel, MD;  Location: [facility];  Service: Cardiopulmonary;  Laterality: Bilateral;       Family History  Problem Relation Age of Onset  . Breast cancer Mother   . Cirrhosis Father   . Alcohol abuse Father   . Hypertension Sister   . Cancer Brother   . Hypertension Sister   . Hypertension Sister   . Hypertension Sister   . Hypertension Sister     Social History   Tobacco Use  . Smoking status: Former Smoker    Packs/day: 1.00  . Smokeless tobacco: Never Used  .  Tobacco comment: quit cigs 5 months ago  Vaping Use  . Vaping Use: Never used  Substance Use Topics  . Alcohol use: Not Currently    Comment: pt drinks 3 large beers daily. hasnt had any in 5 months  . Drug use: Not Currently    Types: Marijuana    Comment: daily. as of Jan 18,2021 hasnt had any in 5 months    Home Medications Prior to Admission medications   Medication Sig Start Date End Date Taking? Authorizing Provider  CARBOPLATIN IV Inject into the vein every 21 ( twenty-one) days. Every 3 weeks x 4 cycles 07/05/19   [provider]  dexamethasone (DECADRON) 4 MG  tablet Take 1 tablet (4 mg total) by mouth 3 (three) times daily. 03/06/20   Hayden Pedro, PA-C  ferrous sulfate 325 (65 FE) MG EC tablet Take 325 mg by mouth daily.    [provider]  folic acid (FOLVITE) 1 MG tablet Take 1 tablet (1 mg total) by mouth daily. 11/14/19   Derek Jack, MD  HYDROcodone-acetaminophen (NORCO) 5-325 MG tablet Take one tablet po q hs prn pain, or up to every 6 hours as needed. 02/22/20   Hayden Pedro, PA-C  levETIRAcetam (KEPPRA) 500 MG tablet Take 1 tablet (500 mg total) by mouth 2 (two) times daily. 11/14/19   Ventura Sellers, MD  lidocaine-prilocaine (EMLA) cream Apply a small amount to port a cath site and cover with plastic wrap 1 hour prior to chemotherapy appointments 06/21/19   Derek Jack, MD  magnesium oxide (MAG-OX) 400 (241.3 Mg) MG tablet Take 1 tablet (400 mg total) by mouth 2 (two) times daily. 09/09/19   Derek Jack, MD  pantoprazole (PROTONIX) 40 MG tablet Take 40 mg by mouth daily. 02/22/20   [provider]  PEMBROLIZUMAB IV Inject into the vein every 21 ( twenty-one) days. 07/05/19   [provider]  PEMEtrexed 500 mg/m2 in sodium chloride 0.9 % 100 mL Inject 500 mg/m2 into the vein every 21 ( twenty-one) days. Every 3 weeks x 4 cycles 07/05/19   [provider]  polyethylene glycol (GOLYTELY) 236 g solution Drink 8 ounce glass of GoLYTELY every hour until you have a bowel movement 03/10/20   Milton Ferguson, MD  prochlorperazine (COMPAZINE) 10 MG tablet Take 1 tablet (10 mg total) by mouth every 6 (six) hours as needed (Nausea or vomiting). 06/21/19   Derek Jack, MD    Allergies    Patient has no known allergies.  Review of Systems   Review of Systems  All other systems reviewed and are negative.   Physical Exam Updated Vital Signs There were no vitals taken for this visit.  Physical Exam Vitals and nursing note reviewed.  Constitutional:      General: He is  not in acute distress.    Appearance: He is well-developed.  HENT:     Head: Normocephalic and atraumatic.     Mouth/Throat:     Pharynx: No oropharyngeal exudate.  Eyes:     General: No scleral icterus.       Right eye: No discharge.        Left eye: No discharge.     Conjunctiva/sclera: Conjunctivae normal.     Pupils: Pupils are equal, round, and reactive to light.  Neck:     Thyroid: No thyromegaly.     Vascular: No JVD.  Cardiovascular:     Rate and Rhythm: Normal rate and regular rhythm.  Heart sounds: Normal heart sounds. No murmur heard.  No friction rub. No gallop.   Pulmonary:     Effort: Pulmonary effort is normal. No respiratory distress.     Breath sounds: Normal breath sounds. No wheezing or rales.  Abdominal:     General: Bowel sounds are normal. There is no distension.     Palpations: Abdomen is soft. There is no mass.     Tenderness: There is no abdominal tenderness.     Comments: The abdomen is soft with a diffusely normal appearance, normal bowel sounds.  Minimal tenderness diffusely, no guarding or peritoneal signs  Musculoskeletal:        General: No tenderness. Normal range of motion.     Cervical back: Normal range of motion and neck supple.     Comments: Bilateral leg weakness at the hip flexors as well as the knees and the ankles  Lymphadenopathy:     Cervical: No cervical adenopathy.  Skin:    General: Skin is warm and dry.     Findings: No erythema or rash.  Neurological:     Mental Status: He is alert.     Coordination: Coordination normal.     Comments: Weakness at the hip flexors knees and ankles, in fact he is barely able to move his right leg and rotation but unable to lift at the hip, extend at the knee or the ankle.  He has decrease in station bilaterally.  The left leg is totally flaccid.  He has minimal if any reflexes at the patellar tendons bilaterally, there is mild anal sphincter tone  Psychiatric:        Behavior: Behavior normal.      Comments: Denies suicidality, not responding to internal stimuli     ED Results / Procedures / Treatments   Labs (all labs ordered are listed, but only abnormal results are displayed) Labs Reviewed - No data to display  EKG None  Radiology DG Abdomen 1 View  Result Date: 03/10/2020 CLINICAL DATA:  Constipation. EXAM: ABDOMEN - 1 VIEW COMPARISON:  None. FINDINGS: No abnormal bowel dilatation is noted. Large amount of stool seen throughout the colon. No radio-opaque calculi or other significant radiographic abnormality are seen. IMPRESSION: Large stool burden. No evidence of bowel obstruction or ileus. Electronically Signed   By: Marijo Conception M.D.   On: 03/10/2020 11:36    Procedures .Critical Care Performed by: Noemi Chapel, MD Authorized by: Noemi Chapel, MD   Critical care provider statement:    Critical care time (minutes):  35   Critical care time was exclusive of:  Separately billable procedures and treating other patients and teaching time   Critical care was necessary to treat or prevent imminent or life-threatening deterioration of the following conditions:  CNS failure or compromise   Critical care was time spent personally by me on the following activities:  Blood draw for specimens, development of treatment plan with patient or surrogate, discussions with consultants, evaluation of patient's response to treatment, examination of patient, obtaining history from patient or surrogate, ordering and performing treatments and interventions, ordering and review of laboratory studies, ordering and review of radiographic studies, pulse oximetry, re-evaluation of patient's condition and review of old charts Comments:         (including critical care time)  Medications Ordered in ED Medications - No data to display  ED Course  I have reviewed the triage vital signs and the nursing notes.  Pertinent labs & imaging results that were  available during my care of the  patient were reviewed by me and considered in my medical decision making (see chart for details).  Clinical Course as of Mar 12 1603  Sun Mar 11, 2020  1603 I discussed the case with oncology, he states that when he was seen in the office on the 29th he just had left leg weakness and was not having any urinary or bowel issues at that time.  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  Will discuss with oncology  Per MRI form 03/07/20:   Since the prior MRI of 02/01/2020, there has been progression of a metastasis within the posterior aspect of the L2 vertebral body now with extraosseous extension. The extraosseous tumor extends into the left ventral aspect of the spinal canal at the L2 vertebral body level and into the L1-L2 left lateral recess. There is resultant encroachment upon the descending and exiting left L2 nerve root.  L4-L5: Grade 1 retrolisthesis. Mild bony retropulsion at the level of the L4 inferior endplate. Associated disc bulge and endplate spurring. Facet and ligamentum flavum hypertrophy. Multifactorial severe bilateral subarticular stenosis with likely impingement of the bilateral descending L5 nerve roots. Additionally, there may be impingement of the nerve roots within the central canal. Mild right neural foraminal narrowing.  At L4-L5, there is multifactorial severe bilateral subarticular  and central canal stenosis which may have subtly progressed. There is likely impingement of the bilateral descending L5 nerve roots. Additionally, there may be impingement of the descending cauda equina nerve roots within the central canal. Unchanged mild right neural foraminal narrowing.  Constipation: The patient will need to have an enema, he is on hydrocodone for pain which I suspect is part of this but also likely related to his lack of bowel inertia from cauda equina.  Suicidal comments: The patient endorses that these were not true, he is feeling depressed because he has no one to take care of him at home, his girlfriend is left to take care of her father and he is nonambulatory and unable to get out of the bed.  He does not need to be involuntarily committed, he does need to be placed in a nursing facility  Dr. Teressa Senter this patient be transferred to [facility] hospital immediately, consultation with radiation oncology for possible stat radiation, neurosurgery, Dr. [name] states that this is a nonsurgical case given his advanced cancer and he would not do well.  This patient is critically ill with what appears to be clinical cauda equina, labs show chronic anemia, he has been given a enema for his constipation, will work on stat transfer.  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  

## 2020-03-11 NOTE — H&P (Signed)
History and Physical    Brandon Ferguson MVH:[id number] DOB: [birth year] DOA: 03/11/2020  PCP: Patient, No Pcp Per   Patient coming from: Home  I have personally briefly reviewed patient's old medical records in [place]  Chief Complaint: Can't walk, constipation  HPI: Brandon Ferguson is a 59 y.o. 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  Patient presented to the ED with complaints of not been able to walk that has progressed over the past month.  He also has been constipated and unable to have a bowel movement in at least 9 days.  He is unable to eat, and reports vomiting once. Today patient called his sister telling her that he was going to blow off his head with a gun and or take an overdose of pills. Patient sister called the police, when police arrived on scene there was no gun in the room.  On my questioning patient, patient tells me he was just talking out of his head, he is not suicidal and does not plan to take his life.  He tells me he could not have shot himself because he cannot walk and his gun was not in the room.  Per notes patient reported that he said this so that he can get to the hospital, and did not want to state the specific reason why he needed to be in the hospital.  Patient was in the ED yesterday for constipation, he was prescribed GoLYTELY.  Patient says he was unable to take the medication as he has not been able to walk he is currently wearing diapers for incontinence.  ED Course: Stable vitals.  WBC 13.4.  Sodium 133.  Potassium 3.5. EDP reviewed recent imaging findings-MRI done 03/07/2020.  Talked to [name], who recommended urgent transfer to [facility] for radiation therapy, considering rapid progression of symptoms. EDP also talked to neurosurgery, Dr. [name], patient not a surgical candidate.  Review of Systems: As per HPI all other systems reviewed and negative.  Past Medical History:  Diagnosis Date   . Cancer ([place])   . GERD (gastroesophageal reflux disease)   . Iron deficiency   . Port-A-Cath in place 06/17/2019  . Vitamin D deficiency     Past Surgical History:  Procedure Laterality Date  . KNEE SURGERY    . PORTACATH PLACEMENT Left 06/29/2019   Procedure: INSERTION PORT-A-CATH;  Surgeon: Brandon Signs, MD;  Location: [facility];  Service: General;  Laterality: Left;  [name] Kitchen VIDEO BRONCHOSCOPY Bilateral 04/29/2019   Procedure: VIDEO BRONCHOSCOPY WITH FLUORO;  Surgeon: Brandon Coder, MD;  Location: [facility] END[facility];  Service: Endoscopy;  Laterality: Bilateral;  . VIDEO BRONCHOSCOPY Bilateral 05/03/2019   Procedure: VIDEO BRONCHOSCOPY WITHOUT FLUORO;  Surgeon: Brandon Noel, MD;  Location: [facility];  Service: Cardiopulmonary;  Laterality: Bilateral;     reports that he has quit smoking. He smoked 1.00 pack per day. He has never used smokeless tobacco. He reports previous alcohol use. He reports previous drug use. Drug: Marijuana.  No Known Allergies  Family History  Problem Relation Age of Onset  . Breast cancer Mother   . Cirrhosis Father   . Alcohol abuse Father   . Hypertension Sister   . Cancer Brother   . Hypertension Sister   . Hypertension Sister   . Hypertension Sister   . Hypertension Sister     Prior to Admission medications   Medication Sig Start Date End Date Taking? Authorizing Provider  CARBOPLATIN IV Inject into  the vein every 21 ( twenty-one) days. Every 3 weeks x 4 cycles 07/05/19   [provider]  dexamethasone (DECADRON) 4 MG tablet Take 1 tablet (4 mg total) by mouth 3 (three) times daily. 03/06/20   Hayden Pedro, PA-C  ferrous sulfate 325 (65 FE) MG EC tablet Take 325 mg by mouth daily.    [provider]  folic acid (FOLVITE) 1 MG tablet Take 1 tablet (1 mg total) by mouth daily. 11/14/19   Derek Jack, MD  HYDROcodone-acetaminophen (NORCO) 5-325 MG tablet Take one tablet po q hs prn pain, or up to every 6 hours as  needed. 02/22/20   Hayden Pedro, PA-C  levETIRAcetam (KEPPRA) 500 MG tablet Take 1 tablet (500 mg total) by mouth 2 (two) times daily. 11/14/19   Ventura Sellers, MD  lidocaine-prilocaine (EMLA) cream Apply a small amount to port a cath site and cover with plastic wrap 1 hour prior to chemotherapy appointments 06/21/19   Derek Jack, MD  magnesium oxide (MAG-OX) 400 (241.3 Mg) MG tablet Take 1 tablet (400 mg total) by mouth 2 (two) times daily. 09/09/19   Derek Jack, MD  pantoprazole (PROTONIX) 40 MG tablet Take 40 mg by mouth daily. 02/22/20   [provider]  PEMBROLIZUMAB IV Inject into the vein every 21 ( twenty-one) days. 07/05/19   [provider]  PEMEtrexed 500 mg/m2 in sodium chloride 0.9 % 100 mL Inject 500 mg/m2 into the vein every 21 ( twenty-one) days. Every 3 weeks x 4 cycles 07/05/19   [provider]  polyethylene glycol (GOLYTELY) 236 g solution Drink 8 ounce glass of GoLYTELY every hour until you have a bowel movement 03/10/20   Brandon Ferguson, MD  prochlorperazine (COMPAZINE) 10 MG tablet Take 1 tablet (10 mg total) by mouth every 6 (six) hours as needed (Nausea or vomiting). 06/21/19   Derek Jack, MD    Physical Exam: Vitals:   03/11/20 1549  Weight: 59 kg  Height: 6' (1.829 m)    Constitutional: NAD, calm, comfortable Vitals:   03/11/20 1549  Weight: 59 kg  Height: 6' (1.829 m)   Eyes: PERRL, lids and conjunctivae normal ENMT: Mucous membranes are dry. Neck: normal, supple, no masses, no thyromegaly Respiratory:  Normal respiratory effort. No accessory muscle use.  Cardiovascular: Regular rate and rhythm,  No extremity edema. 2+ pedal pulses. No carotid bruits.  Abdomen: no tenderness, no masses palpated. No hepatosplenomegaly. Bowel sounds positive.  Musculoskeletal: no clubbing / cyanosis. No joint deformity upper and lower extremities. Good ROM, no contractures. Normal muscle tone.  Skin: no rashes,  lesions, ulcers. No induration Neurologic: No apparent cranial nerve abnormality, sensation appears intact.  4/5 strength bilateral upper extremity, 1/5 strength in bilateral lower extremity.  He is only able to wiggle his toes minimally. Psychiatric: Normal judgment and insight. Alert and oriented x 3.  Flat affect.  Labs on Admission: I have personally reviewed following labs and imaging studies  CBC: Recent Labs  Lab 03/07/20 0948 03/11/20 1731  WBC 13.1* 13.4*  NEUTROABS 10.9* 11.1*  HGB 7.7* 7.5*  HCT 26.7* 25.0*  MCV 87.0 85.3  PLT 432* 741   Basic Metabolic Panel: Recent Labs  Lab 03/07/20 0948 03/11/20 1731  NA 135 133*  K 3.4* 3.5  CL 95* 95*  CO2 27 25  GLUCOSE 172* 103*  BUN 20 17  CREATININE 0.44* 0.40*  CALCIUM 10.0 9.9   Liver Function Tests: Recent Labs  Lab 03/07/20 0948 03/11/20 1731  AST 24 23  ALT 24 17  ALKPHOS 245* 322*  BILITOT 0.4 0.8  PROT 8.9* 8.0  ALBUMIN 3.0* 2.9*    Radiological Exams on Admission: DG Abdomen 1 View  Result Date: 03/10/2020 CLINICAL DATA:  Constipation. EXAM: ABDOMEN - 1 VIEW COMPARISON:  None. FINDINGS: No abnormal bowel dilatation is noted. Large amount of stool seen throughout the colon. No radio-opaque calculi or other significant radiographic abnormality are seen. IMPRESSION: Large stool burden. No evidence of bowel obstruction or ileus. Electronically Signed   By: Marijo Conception M.D.   On: 03/10/2020 11:36    EKG: None.   Assessment/Plan Principal Problem:   Paraplegia (HCC) Active Problems:   Metastatic cancer to brain [facility])   Malignant neoplasm of right upper lobe of lung (HCC)   Bone metastasis (HCC)   Paraplegia likely secondary to metastatic lung cancer- barely 1/5 strength in bilateral lower extremity, urinary incontinence, recent MRI lumbar and thoracic spine 9/27,9/29-redemonstrated widespread osseous metastatic disease throughout the visualized lumbar sacral spine and bilateral iliac bones,  progression of metastasis to the L2 vertebral body with extraosseous extension, pathologic T1 compression fracture with bulky retropulsion of bone and or tumor resulting in moderate cord compression (see detailed report). - EDP talked to [name], recommended urgent [name] long transfer, EDP also talked to neurosurgery, patient not a surgical candidate. -I talked with Dr. Lisbeth Renshaw on-call for radiation oncology, he is familiar with the patient, patient had started radiation therapy, his next treatment was to be 10/5, patient to be seen and evaluated in a.m. -IV dexamethasone 10 mg x 1, then resume home dexamethasone 4 mg 3 times daily in a.m.  Metastatic lung cancer to brain and spine-follows with [name].  Currently on chemotherapy, and palliative radiation therapy.  Reports no recent seizures.  Patient denies suicidal ideation at this time, but he voiced intentions of same prior to admission. -Seizure precautions -Suicide precautions -Resume home Keppra  Constipation- patient is on chronic opioids. -Magnesium sulfate x 1 bottle.  -Bowel regimen - Hydrate N/s + 40 KCL 100cc/hr x 15hrs.   DVT prophylaxis: Heparin Code Status: Full code Family Communication: None at bedside Disposition Plan: ~ 1- 2 days Consults called: Radiation oncology Admission status: Inpatient, MedSurg I certify that at the point of admission it is my clinical judgment that the patient will require inpatient hospital care spanning beyond 2 midnights from the point of admission due to high intensity of service, high risk for further deterioration and high frequency of surveillance required. The following factors support the patient status of inpatient: Paraplegia, unable to ambulate requiring radiation therapy.   Bethena Roys MD [facility]  03/11/2020, 8:50 PM

## 2020-03-11 NOTE — Consult Note (Signed)
I was called by Dr. Sabra Heck at [facility] ER regarding this patient.  This unfortunate 59 year old black male has widely metastatic lung cancer , including spinal and brain metastasis.  He has gotten chemotherapy and radiation therapy.  By report he has been weak in his legs for least a week and now is paraplegic.   There is an unclear report of suicidal ideation.  He was brought to the ER via EMS. He was worked up with a thoracic and lumbar MRI which demonstrated widely metastatic spinal tumors.  By report the on-call radiation oncologist recommended palliative radiation therapy, but also requested my opinion. I have reviewed his lumbar and thoracic MRIs performed today. The likely symptomatic lesion is at T1. With his history, he does not seem to be a very good surgical candidate having terminal widely metastatic lung adenocarcinoma.  Furthermore the chances of meaningful recovery of neurologic function, even with surgery, are extremely remote.  I would concur with radiation oncologist plan of palliative radiation therapy and comfort care.  I will be glad to see him if he is transferred to [facility]  Please call if I can be of further assistance.

## 2020-03-11 NOTE — ED Notes (Signed)
[facility] called spoke with [place] [facility] will be sent after 1900 to transfer pt.

## 2020-03-11 NOTE — ED Notes (Signed)
Patient wanded by security. 

## 2020-03-12 ENCOUNTER — Inpatient Hospital Stay: Payer: Medicaid Other | Attending: Radiation Oncology

## 2020-03-12 ENCOUNTER — Ambulatory Visit
Admission: RE | Admit: 2020-03-12 | Discharge: 2020-03-12 | Disposition: A | Discharge status: Home / Self Care | Payer: Medicaid Other | Source: Ambulatory Visit | Attending: Radiation Oncology | Admitting: Radiation Oncology

## 2020-03-12 DIAGNOSIS — C7931 Secondary malignant neoplasm of brain: Secondary | ICD-10-CM | POA: Diagnosis not present

## 2020-03-12 DIAGNOSIS — C3411 Malignant neoplasm of upper lobe, right bronchus or lung: Secondary | ICD-10-CM | POA: Insufficient documentation

## 2020-03-12 DIAGNOSIS — K5900 Constipation, unspecified: Secondary | ICD-10-CM | POA: Diagnosis not present

## 2020-03-12 DIAGNOSIS — Z515 Encounter for palliative care: Secondary | ICD-10-CM

## 2020-03-12 DIAGNOSIS — C7951 Secondary malignant neoplasm of bone: Secondary | ICD-10-CM | POA: Insufficient documentation

## 2020-03-12 LAB — CBC
HCT: 27.7 % — ABNORMAL LOW (ref 39.0–52.0)
Hemoglobin: 8 g/dL — ABNORMAL LOW (ref 13.0–17.0)
MCH: 25.7 pg — ABNORMAL LOW (ref 26.0–34.0)
MCHC: 28.9 g/dL — ABNORMAL LOW (ref 30.0–36.0)
MCV: 89.1 fL (ref 80.0–100.0)
Platelets: 203 10*3/uL (ref 150–400)
RBC: 3.11 MIL/uL — ABNORMAL LOW (ref 4.22–5.81)
RDW: 20.2 % — ABNORMAL HIGH (ref 11.5–15.5)
WBC: 7.4 10*3/uL (ref 4.0–10.5)
nRBC: 0 % (ref 0.0–0.2)

## 2020-03-12 LAB — BASIC METABOLIC PANEL
Anion gap: 13 (ref 5–15)
BUN: 17 mg/dL (ref 6–20)
CO2: 23 mmol/L (ref 22–32)
Calcium: 9.6 mg/dL (ref 8.9–10.3)
Chloride: 98 mmol/L (ref 98–111)
Creatinine, Ser: 0.4 mg/dL — ABNORMAL LOW (ref 0.61–1.24)
GFR calc Af Amer: >60 mL/min (ref >60)
GFR calc non Af Amer: >60 mL/min (ref >60)
Glucose, Bld: 222 mg/dL — ABNORMAL HIGH (ref 70–99)
Potassium: 4.6 mmol/L (ref 3.5–5.1)
Sodium: 134 mmol/L — ABNORMAL LOW (ref 135–145)

## 2020-03-12 LAB — HIV ANTIBODY (ROUTINE TESTING W REFLEX): HIV Screen 4th Generation wRfx: NONREACTIVE

## 2020-03-12 MED ORDER — HYDROMORPHONE HCL 1 MG/ML IJ SOLN: 0.5000 mg | INTRAMUSCULAR | Status: DC | PRN

## 2020-03-12 MED ORDER — CHLORHEXIDINE GLUCONATE CLOTH 2 % EX PADS
6.0000 | MEDICATED_PAD | Freq: Every day | CUTANEOUS | Status: DC
  Administered 2020-03-12 – 2020-03-23 (×9): 6 via TOPICAL

## 2020-03-12 NOTE — Progress Notes (Signed)
PROGRESS NOTE    Brandon Ferguson   FHL:[id number]  DOB: January 02, 1961  DOA: 03/11/2020     1  PCP: Patient, No Pcp Per  CC: constipation  Hospital Course: Brandon Ferguson is a 59 y.o. 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  Patient presented to the ED with complaints of not been able to walk that has progressed over the past month.  He also has been constipated and unable to have a bowel movement in at least 9 days.  He is unable to eat, and reports vomiting once.  Patient called his sister telling her that he was going to blow off his head with a gun and or take an overdose of pills. Patient sister called the police, when police arrived on scene there was no gun in the room.   On my questioning patient, patient tells me he was just talking out of his head, he is not suicidal and does not plan to take his life.  He tells me he could not have shot himself because he cannot walk and his gun was not in the room.  Per notes patient reported that he said this so that he can get to the hospital, and did not want to state the specific reason why he needed to be in the hospital.   Patient was in the ED the day PTA for constipation, he was prescribed GoLYTELY.  Patient says he was unable to take the medication as he has not been able to walk he is currently wearing diapers for incontinence.   ED Course: Stable vitals.  WBC 13.4.  Sodium 133.  Potassium 3.5. EDP reviewed recent imaging findings-MRI done 03/07/2020.  Talked to [name], who recommended urgent transfer to [facility] for radiation therapy, considering rapid progression of symptoms. EDP also talked to neurosurgery, Dr. [name], patient not a surgical candidate.   Interval History:  Admitted overnight. Sitter bedside. Discussed his cancer history at length and his current state of health and quality of life. He seems to have poor understand at baseline of his condition but after some explanation  did seem to understand some and process it. Palliative care also met with him and discussed similar. Currently still Full code and ongoing [place] discussions with patient and family are taking place.   Old records reviewed in assessment of this patient  ROS: Constitutional: positive for fatigue and malaise, Respiratory: negative, Cardiovascular: negative for chest pain and Gastrointestinal: positive for constipation  Assessment & Plan: * Malignant neoplasm of right upper lobe of lung ([place]) - barely 1/5 strength in bilateral lower extremity, urinary incontinence, recent MRI lumbar and thoracic spine 9/27,9/29-redemonstrated widespread osseous metastatic disease throughout the visualized lumbar sacral spine and bilateral iliac bones, progression of metastasis to the L2 vertebral body with extraosseous extension, pathologic T1 compression fracture with bulky retropulsion of bone and or tumor resulting in moderate cord compression (see detailed report). - EDP talked to [name], recommended urgent [name] [facility] transfer, EDP also talked to neurosurgery, patient not a surgical candidate. - follow up rad onc eval - overall his prognosis is poor and he is approaching end of life imminently; if care is de-escalated then sooner but ultimately he is declining and will not thrive much longer - I believe palliative care is moreso priority at this time over any other treatments and pursuing comfort care/end of life care is more likely to bring patient relief over other treatments. Ongoing aggressive care is starting to seem  futile given his current status  Constipation - chronic opioids - failed outpatient attempt  - will await further [place] discussions before pursuing aggressive regimen as likely still futile   Paraplegia ([place]) - see cancer  Bone metastasis ([place]) - see cancer  Metastatic cancer to brain [facility]) - follows with [name].  Currently on chemotherapy, and palliative radiation therapy.   Reports no recent seizures.  Patient denies suicidal ideation at this time, but he voiced intentions of same prior to admission. -Seizure precautions; Resume home Keppra    Antimicrobials: none  DVT prophylaxis: HSQ Code Status: Full Family Communication: Palliative to discuss with family Disposition Plan: Status is: Inpatient  Remains inpatient appropriate because:Unsafe d/c plan, IV treatments appropriate due to intensity of illness or inability to take PO and Inpatient level of care appropriate due to severity of illness   Dispo: The patient is from: Home              Anticipated d/c is to: pending clinical course and [place] discussions              Anticipated d/c date is: 2 days              Patient currently is not medically stable to d/c.       Objective: Blood pressure 111/75, pulse 81, temperature 98.3 F (36.8 C), temperature source Oral, resp. rate 18, height 6' (1.829 m), weight 59 kg, SpO2 99 %.  Examination: General appearance: cachectic and chronically ill man laying in bed in no distress with sitter bedside Head: Normocephalic, without obvious abnormality, atraumatic Eyes: EOMI Lungs: clear to auscultation bilaterally Heart: regular rate and rhythm and S1, S2 normal Abdomen: normal findings: bowel sounds normal and soft, non-tender Extremities: no edema Skin: mobility and turgor normal Neurologic: 1/5 LE strength; 4/5 upper extremity strength  Consultants:   Rad onc  Palliative care  Procedures:   none  Data Reviewed: I have personally reviewed following labs and imaging studies Results for orders placed or performed during the hospital encounter of 03/11/20 (from the past 24 hour(s))  CBC with Differential/Platelet     Status: Abnormal   Collection Time: 03/11/20  5:31 PM  Result Value Ref Range   WBC 13.4 (H) 4.0 - 10.5 K/uL   RBC 2.93 (L) 4.22 - 5.81 MIL/uL   Hemoglobin 7.5 (L) 13.0 - 17.0 g/dL   HCT 25.0 (L) 39 - 52 %   MCV 85.3 80.0 -  100.0 fL   MCH 25.6 (L) 26.0 - 34.0 pg   MCHC 30.0 30.0 - 36.0 g/dL   RDW 19.9 (H) 11.5 - 15.5 %   Platelets 208 150 - 400 K/uL   nRBC 0.0 0.0 - 0.2 %   Neutrophils Relative % 84 %   Neutro Abs 11.1 (H) 1.7 - 7.7 K/uL   Lymphocytes Relative 7 %   Lymphs Abs 1.0 0.7 - 4.0 K/uL   Monocytes Relative 8 %   Monocytes Absolute 1.1 (H) 0 - 1 K/uL   Eosinophils Relative 0 %   Eosinophils Absolute 0.1 0 - 0 K/uL   Basophils Relative 0 %   Basophils Absolute 0.0 0 - 0 K/uL   Immature Granulocytes 1 %   Abs Immature Granulocytes 0.16 (H) 0.00 - 0.07 K/uL  Comprehensive metabolic panel     Status: Abnormal   Collection Time: 03/11/20  5:31 PM  Result Value Ref Range   Sodium 133 (L) 135 - 145 mmol/L   Potassium 3.5 3.5 -  5.1 mmol/L   Chloride 95 (L) 98 - 111 mmol/L   CO2 25 22 - 32 mmol/L   Glucose, Bld 103 (H) 70 - 99 mg/dL   BUN 17 6 - 20 mg/dL   Creatinine, Ser 0.40 (L) 0.61 - 1.24 mg/dL   Calcium 9.9 8.9 - 10.3 mg/dL   Total Protein 8.0 6.5 - 8.1 g/dL   Albumin 2.9 (L) 3.5 - 5.0 g/dL   AST 23 15 - 41 U/L   ALT 17 0 - 44 U/L   Alkaline Phosphatase 322 (H) 38 - 126 U/L   Total Bilirubin 0.8 0.3 - 1.2 mg/dL   GFR calc non Af Amer >60 >60 mL/min   GFR calc Af Amer >60 >60 mL/min   Anion gap 13 5 - 15  HIV Antibody (routine testing w rflx)     Status: None   Collection Time: 03/12/20  9:16 AM  Result Value Ref Range   HIV Screen 4th Generation wRfx Non Reactive Non Reactive  Basic metabolic panel     Status: Abnormal   Collection Time: 03/12/20  9:16 AM  Result Value Ref Range   Sodium 134 (L) 135 - 145 mmol/L   Potassium 4.6 3.5 - 5.1 mmol/L   Chloride 98 98 - 111 mmol/L   CO2 23 22 - 32 mmol/L   Glucose, Bld 222 (H) 70 - 99 mg/dL   BUN 17 6 - 20 mg/dL   Creatinine, Ser 0.40 (L) 0.61 - 1.24 mg/dL   Calcium 9.6 8.9 - 10.3 mg/dL   GFR calc non Af Amer >60 >60 mL/min   GFR calc Af Amer >60 >60 mL/min   Anion gap 13 5 - 15  CBC     Status: Abnormal   Collection Time: 03/12/20   9:16 AM  Result Value Ref Range   WBC 7.4 4.0 - 10.5 K/uL   RBC 3.11 (L) 4.22 - 5.81 MIL/uL   Hemoglobin 8.0 (L) 13.0 - 17.0 g/dL   HCT 27.7 (L) 39 - 52 %   MCV 89.1 80.0 - 100.0 fL   MCH 25.7 (L) 26.0 - 34.0 pg   MCHC 28.9 (L) 30.0 - 36.0 g/dL   RDW 20.2 (H) 11.5 - 15.5 %   Platelets 203 150 - 400 K/uL   nRBC 0.0 0.0 - 0.2 %    Recent Results (from the past 240 hour(s))  Respiratory Panel by RT PCR (Flu A&B, Covid) - Nasopharyngeal Swab     Status: None   Collection Time: 03/11/20  3:59 PM   Specimen: Nasopharyngeal Swab  Result Value Ref Range Status   SARS Coronavirus 2 by RT PCR NEGATIVE NEGATIVE Final    Comment: (NOTE) SARS-CoV-2 target nucleic acids are NOT DETECTED.  The SARS-CoV-2 RNA is generally detectable in upper respiratoy specimens during the acute phase of infection. The lowest concentration of SARS-CoV-2 viral copies this assay can detect is 131 copies/mL. A negative result does not preclude SARS-Cov-2 infection and should not be used as the sole basis for treatment or other patient management decisions. A negative result may occur with  improper specimen collection/handling, submission of specimen other than nasopharyngeal swab, presence of viral mutation(s) within the areas targeted by this assay, and inadequate number of viral copies (<131 copies/mL). A negative result must be combined with clinical observations, patient history, and epidemiological information. The expected result is Negative.  Fact Sheet for Patients:  PinkCheek.be  Fact Sheet for Healthcare Providers:  GravelBags.it  This test is no t  yet approved or cleared by the [place] and  has been authorized for detection and/or diagnosis of SARS-CoV-2 by FDA under an Emergency Use Authorization (EUA). This EUA will remain  in effect (meaning this test can be used) for the duration of the COVID-19 declaration under Section  564(b)(1) of the Act, 21 U.S.C. section 360bbb-3(b)(1), unless the authorization is terminated or revoked sooner.     Influenza A by PCR NEGATIVE NEGATIVE Final   Influenza B by PCR NEGATIVE NEGATIVE Final    Comment: (NOTE) The Xpert Xpress SARS-CoV-2/FLU/RSV assay is intended as an aid in  the diagnosis of influenza from Nasopharyngeal swab specimens and  should not be used as a sole basis for treatment. Nasal washings and  aspirates are unacceptable for Xpert Xpress SARS-CoV-2/FLU/RSV  testing.  Fact Sheet for Patients: PinkCheek.be  Fact Sheet for Healthcare Providers: GravelBags.it  This test is not yet approved or cleared by the [place] FDA and  has been authorized for detection and/or diagnosis of SARS-CoV-2 by  FDA under an Emergency Use Authorization (EUA). This EUA will remain  in effect (meaning this test can be used) for the duration of the  Covid-19 declaration under Section 564(b)(1) of the Act, 21  U.S.C. section 360bbb-3(b)(1), unless the authorization is  terminated or revoked. Performed at [facility], [address] [place], [place] [address]      Radiology Studies: No results found. No orders to display    Scheduled Meds: . Chlorhexidine Gluconate Cloth  6 each Topical Daily  . dexamethasone  4 mg Oral TID  . heparin  5,000 Units Subcutaneous Q8H  . levETIRAcetam  500 mg Oral BID  . polyethylene glycol  17 g Oral BID  . senna-docusate  2 tablet Oral QHS   PRN Meds: acetaminophen **OR** acetaminophen, HYDROcodone-acetaminophen, HYDROmorphone (DILAUDID) injection Continuous Infusions:    LOS: 1 day  Time spent: Greater than 50% of the 35 minute visit was spent in counseling/coordination of care for the patient as laid out in the A&P.   Dwyane Dee, MD [facility] 03/12/2020, 4:09 PM  Contact via secure chat.  To contact the attending provider between 7A-7P or the covering  provider during after hours 7P-7A, please log into the web site [email or web address] and access using universal [place] password for that web site. If you do not have the password, please call the hospital operator.

## 2020-03-12 NOTE — Assessment & Plan Note (Signed)
-   see cancer

## 2020-03-12 NOTE — Assessment & Plan Note (Signed)
-   follows with [name].  Currently on chemotherapy, and palliative radiation therapy.  Reports no recent seizures.  Patient denies suicidal ideation at this time, but he voiced intentions of same prior to admission. -Seizure precautions; Resume home [place]

## 2020-03-12 NOTE — Assessment & Plan Note (Signed)
-   chronic opioids - failed outpatient attempt  - will await further [place] discussions before pursuing aggressive regimen as likely still futile

## 2020-03-12 NOTE — Hospital Course (Addendum)
He wasMr. Ferguson is a 59 y.o. 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  Patient presented to the ED with complaints of not been able to walk that has progressed over the past month.  He also has been constipated and unable to have a bowel movement in at least 9 days.  He is unable to eat, and reports vomiting once.  Patient called his sister telling her that he was going to blow off his head with a gun and or take an overdose of pills. Patient sister called the police, when police arrived on scene there was no gun in the room.   On my questioning patient, patient tells me he was just talking out of his head, he is not suicidal and does not plan to take his life.  He tells me he could not have shot himself because he cannot walk and his gun was not in the room.  Per notes patient reported that he said this so that he can get to the hospital, and did not want to state the specific reason why he needed to be in the hospital.   Patient was in the ED the day PTA for constipation, he was prescribed GoLYTELY.  Patient says he was unable to take the medication as he has not been able to walk he is currently wearing diapers for incontinence.   ED Course: Stable vitals.  WBC 13.4.  Sodium 133.  Potassium 3.5. EDP reviewed recent imaging findings-MRI done 03/07/2020.  Talked to [name], who recommended urgent transfer to [facility] for radiation therapy, considering rapid progression of symptoms. EDP also talked to neurosurgery, Dr. [name], patient not a surgical candidate.  He was evaluated by palliative care on 10/4; overall he wishes for family discussions to take place between [facility] and them regarding his next steps of care. Tried discussing his CODE STATUS further with him on 03/13/2020 and he was still in opposition to making a decision.  He did wish to consider going to a SNF but was not wanting hospice at this time.  He continues to state that he wants to "keep  living".  However he also states that he wants quality of life more than anything.

## 2020-03-12 NOTE — Consult Note (Signed)
Palliative Care Consultation Reason: Goals of Care  Brandon Ferguson is a 59 yo man with metastatic lung cancer initially diagnosed 04/2019. Patient could give me minimal information or details about his condition, prognosis or treatment plan other than he was going to receive radiation for the cancer in his back. He is extremely exhausted and weak at the time of my visit and at times his voice was so weak I could barely understand him or hear him. When I asked him what his current goals were and expectations moving forward he told me "to get better". The patient did not make a correlation between cancer in his spine and his inability to walk or go to the bathroom. He also did not know that his cancer was not curable and that the intent of his treatments were to slow progression alone. His last chemotherapy was on 8/18 Beryle Flock and Carbo), he has had WBRT, had prior radiation to spine, but showed progression on fu MRI 6/2021and more progression on 8/25 in the spine. Plans up until now have been for ongoing aggressive interventions including more chemotherapy and radio-surgery to his enlarging brain mets. Radiation oncology met with the patient and his sisters and shared with them concerns about his progressive cancer and risk for spinal cord compression- Shona Simpson addressed goals of care on 9/30 and identified multiple gaps in care and unmet needs at home. I was scheduled to see patient this week but he was hospitalized yesterday evening with overt spinal cord compression and suicidal ideation.  He lives alone and his family has not been able to help him. He is not married but has a significant other who helps him occasionally. He has brothers and sisters who are trying to help but they have jobs and are not available during the day. His biggest fear right now is being discharged from the hospital back home where there is no one to help take care of him and he cannot move his legs. He has not been able to use  his legs fully for several weeks- he cannot tell me when he stopped being able to walk but he has been using exercise bands around the bottoms of his feet to move his legs and to get around at home. He has had falls. He has also had a 10 pound weight loss in the past month.I suspect there are also issues with cognition related to brain metastasis. He is actively receiving radiation to spinal mets for compression and high dose decadron. His prognosis given this history for regaining the ability to walk is very poor, fortunately his pain is controlled and not an issue presently.  He has no SI on my visit today. He was appropriately emotional when I explained that his cancer was terminal and that his prognosis for survival beyond a couple of months was unlikely. He asked "Jesus to take him". He shared with me that he recently lost his brother to cancer and he couldn't walk either but didn't know what kind of cancer he had. It is possible today was the first time that he fully processed that this condition was terminal and that he may not walk again.   I attempted to address code status and also reassured him we would make sure a discharge plan would include a facility that could provide 24/7 care for him. While his stated goals align with focusing on comfort and care he told me that he needed his family to be present to make any additional decisions. Prior  to beginning my conversation I asked him who his support people were in his life and if he wanted them included in the conversation- he was unsure about this but did ask me to contact his sisters and his brother- he does not want to be a burden on his family or worry them.  Recommendations:  1. Need to clarify code status preferences- I strongly recommended DNR given his progressive cancer and overall poor functional status.Will need to discuss with his family present.  2. Will reach out to family and meet with them along with patient to determine goals of  care.   3. Would like to introduce and discuss hospice care- unclear if he would qualify for a hospice facility at this point. He cant walk, but for now is not having difficult to manage pain or other symptoms. He is eating well on decadron. We may need to consider LTC placement with hospice services following at [facility]. Given his rapid loss of functional status he may not be a good candidate for any further systemic treatment. Will ask TOC to pursue LTC placement options with hospice services now or in near future.  Lane Hacker, DO Palliative Medicine  Time: 70 minutes Greater than 50%  of this time was spent counseling and coordinating care related to the above assessment and plan.

## 2020-03-12 NOTE — Assessment & Plan Note (Addendum)
-   barely 1/5 strength in bilateral lower extremity, urinary incontinence, recent MRI lumbar and thoracic spine 9/27,9/29-redemonstrated widespread osseous metastatic disease throughout the visualized lumbar sacral spine and bilateral iliac bones, progression of metastasis to the L2 vertebral body with extraosseous extension, pathologic T1 compression fracture with bulky retropulsion of bone and or tumor resulting in moderate cord compression (see detailed report). - EDP talked to [name], recommended urgent [name] [facility] transfer, EDP also talked to neurosurgery, patient not a surgical candidate. - follow up rad onc eval - overall his prognosis is poor and he is approaching end of life imminently; if care is de-escalated then sooner but ultimately he is declining and will not thrive much longer - I believe palliative care is moreso priority at this time over any other treatments and pursuing comfort care/end of life care is more likely to bring patient relief over other treatments. Ongoing aggressive care is starting to seem futile given his current status - d/c suicide precautions; he is not suicidal and denies further ideation -Further conversation had on 03/13/2020, he still does not want to talk about his CODE STATUS.  He does at least endorse wanting to go to an SNF and not back home at this time

## 2020-03-12 NOTE — Progress Notes (Signed)
PMT no charge note  Consult request received, chart reviewed, med history noted,only on PO tylenol currently, has Norco available on the MAR,patient seen briefly, resting in bed, not in distress currently.  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  Will add low dose IV Dilaudid PRN for pain.  Full palliative consult, goals of care discussions and further symptom management to follow.  D/w Dr Hilma Favors.  Loistine Chance MD [place] palliative [phone] No charge.

## 2020-03-13 ENCOUNTER — Ambulatory Visit
Admission: RE | Admit: 2020-03-13 | Discharge: 2020-03-13 | Disposition: A | Discharge status: Home / Self Care | Payer: Medicaid Other | Source: Ambulatory Visit | Attending: Radiation Oncology | Admitting: Radiation Oncology

## 2020-03-13 ENCOUNTER — Other Ambulatory Visit (HOSPITAL_COMMUNITY): Payer: Medicaid Other | Admitting: General Practice

## 2020-03-13 ENCOUNTER — Telehealth (HOSPITAL_COMMUNITY): Payer: Self-pay | Admitting: General Practice

## 2020-03-13 ENCOUNTER — Inpatient Hospital Stay: Payer: Medicaid Other

## 2020-03-13 ENCOUNTER — Encounter: Payer: Self-pay | Admitting: Radiation Oncology

## 2020-03-13 ENCOUNTER — Encounter (HOSPITAL_COMMUNITY): Payer: Self-pay | Admitting: General Practice

## 2020-03-13 DIAGNOSIS — C7951 Secondary malignant neoplasm of bone: Secondary | ICD-10-CM | POA: Diagnosis not present

## 2020-03-13 DIAGNOSIS — C3411 Malignant neoplasm of upper lobe, right bronchus or lung: Secondary | ICD-10-CM | POA: Diagnosis not present

## 2020-03-13 DIAGNOSIS — K5900 Constipation, unspecified: Secondary | ICD-10-CM | POA: Diagnosis not present

## 2020-03-13 DIAGNOSIS — C7931 Secondary malignant neoplasm of brain: Secondary | ICD-10-CM | POA: Diagnosis not present

## 2020-03-13 LAB — BASIC METABOLIC PANEL
Anion gap: 10 (ref 5–15)
BUN: 13 mg/dL (ref 6–20)
CO2: 26 mmol/L (ref 22–32)
Calcium: 9.7 mg/dL (ref 8.9–10.3)
Chloride: 99 mmol/L (ref 98–111)
Creatinine, Ser: 0.41 mg/dL — ABNORMAL LOW (ref 0.61–1.24)
GFR calc Af Amer: >60 mL/min (ref >60)
GFR calc non Af Amer: >60 mL/min (ref >60)
Glucose, Bld: 132 mg/dL — ABNORMAL HIGH (ref 70–99)
Potassium: 4.4 mmol/L (ref 3.5–5.1)
Sodium: 135 mmol/L (ref 135–145)

## 2020-03-13 LAB — CBC WITH DIFFERENTIAL/PLATELET
Abs Immature Granulocytes: 0.09 10*3/uL — ABNORMAL HIGH (ref 0.00–0.07)
Basophils Absolute: 0 10*3/uL (ref 0.0–0.1)
Basophils Relative: 0 %
Eosinophils Absolute: 0 10*3/uL (ref 0.0–0.5)
Eosinophils Relative: 0 %
HCT: 24.2 % — ABNORMAL LOW (ref 39.0–52.0)
Hemoglobin: 7.2 g/dL — ABNORMAL LOW (ref 13.0–17.0)
Immature Granulocytes: 1 %
Lymphocytes Relative: 5 %
Lymphs Abs: 0.5 10*3/uL — ABNORMAL LOW (ref 0.7–4.0)
MCH: 26.2 pg (ref 26.0–34.0)
MCHC: 29.8 g/dL — ABNORMAL LOW (ref 30.0–36.0)
MCV: 88 fL (ref 80.0–100.0)
Monocytes Absolute: 0.6 10*3/uL (ref 0.1–1.0)
Monocytes Relative: 5 %
Neutro Abs: 9.4 10*3/uL — ABNORMAL HIGH (ref 1.7–7.7)
Neutrophils Relative %: 89 %
Platelets: 234 10*3/uL (ref 150–400)
RBC: 2.75 MIL/uL — ABNORMAL LOW (ref 4.22–5.81)
RDW: 19.9 % — ABNORMAL HIGH (ref 11.5–15.5)
WBC: 10.6 10*3/uL — ABNORMAL HIGH (ref 4.0–10.5)
nRBC: 0 % (ref 0.0–0.2)

## 2020-03-13 LAB — MAGNESIUM: Magnesium: 2 mg/dL (ref 1.7–2.4)

## 2020-03-13 LAB — PHOSPHORUS: Phosphorus: 2.3 mg/dL — ABNORMAL LOW (ref 2.5–4.6)

## 2020-03-13 MED ORDER — ENSURE ENLIVE PO LIQD
237.0000 mL | Freq: Two times a day (BID) | ORAL | Status: DC
  Administered 2020-03-13 – 2020-03-23 (×11): 237 mL via ORAL

## 2020-03-13 MED ORDER — SODIUM PHOSPHATES 45 MMOLE/15ML IV SOLN
30.0000 mmol | Freq: Once | INTRAVENOUS | Status: AC
  Administered 2020-03-13: 30 mmol via INTRAVENOUS
  Filled 2020-03-13: qty 10

## 2020-03-13 NOTE — Progress Notes (Signed)
[facility] CSW Progress Notes  Call to Lenna Sciara [phone]) at [facility] SNF Admissions.  She has been in contact w Antony Madura, [place], re this patient's need for placement.  Per facility, due to his Medicaid, the SNF will be responsible to pay a portion of his transportation and treatment costs (including radiation and chemotherapy costs).  This would make SNF placement prohibitive for any facility to accept.  APS worker is trying to work out options for patient. If patient chose to have hospice care, the SNF might be able to accept him as he would not have treatment or transportation costs.  K Avis Epley continues to work to generate options for patient placement.  Edwyna Shell, LCSW Clinical Social Worker Phone:  [phone]

## 2020-03-13 NOTE — Progress Notes (Signed)
Initial Nutrition Assessment  DOCUMENTATION CODES:   Underweight  INTERVENTION:   -Ensure Enlive po BID, each supplement provides 350 kcal and 20 grams of protein  NUTRITION DIAGNOSIS:   Increased nutrient needs related to cancer and cancer related treatments as evidenced by estimated needs  GOAL:   Patient will meet greater than or equal to 90% of their needs  MONITOR:   PO intake, Supplement acceptance, Labs, Weight trends, I & O's, GOC  REASON FOR ASSESSMENT:   Malnutrition Screening Tool    ASSESSMENT:   59 y.o. 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  Patient presented to the ED with complaints of not been able to walk that has progressed over the past month.  He also has been constipated and unable to have a bowel movement in at least 9 days.  He is unable to eat, and reports vomiting once.  Patient undergoing chemotherapy for metastatic lung cancer (last treatment 9/8). Pt's appetite had decreased PTA, with increased weakness.  Pt has drank Ensure supplements in the past. Will order for this admission. Pt now consuming 50-100% of meals since admission.   Per weight records, pt has lost 20 lbs since 05/18/19 (13% wt loss x 10 months, insignificant for time frame).   Medications: Miralax, Senokot Sodium-Phos  Labs reviewed: Low Phos  NUTRITION - FOCUSED PHYSICAL EXAM:  Deferred  Diet Order:   Diet Order            Diet regular Room service appropriate? Yes; Fluid consistency: Thin  Diet effective now                 EDUCATION NEEDS:   No education needs have been identified at this time  Skin:  Skin Assessment: Reviewed RN Assessment  Last BM:  10/5  Height:   Ht Readings from Last 1 Encounters:  03/11/20 6' (1.829 m)    Weight:   Wt Readings from Last 1 Encounters:  03/11/20 59 kg   BMI:  Body mass index is 17.64 kg/m.  Estimated Nutritional Needs:   Kcal:  2229-7989  Protein:   85-100g  Fluid:  1.9L/day  Clayton Bibles, MS, RD, LDN Inpatient Clinical Dietitian Contact information available via Amion

## 2020-03-13 NOTE — NC FL2 (Signed)
[place] LEVEL OF CARE SCREENING TOOL     IDENTIFICATION  Patient Name: Brandon Ferguson Birthdate: [birth year] Sex: male Admission Date (Current Location): 03/11/2020  [place] and [place] Number:  Kathleen Argue [id number] [place] and Address:  [facility],  [place] [address], [place]      Provider Number: [id number]  Attending Physician Name and Address:  Dwyane Dee, MD  Relative Name and Phone Number:       Current Level of Care: Hospital Recommended Level of Care: [place] Prior Approval Number:    Date Approved/Denied:   PASRR Number: [id number]  Discharge Plan: SNF    Current Diagnoses: Patient Active Problem List   Diagnosis Date Noted  . Constipation 03/12/2020  . Paraplegia ([place]) 03/11/2020  . 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 Port-A-Cath in place 06/17/2019  . Goals of care, counseling/discussion 06/16/2019  . Malignant neoplasm of right upper lobe of lung ([place])   . Lung mass 04/28/2019  . Metastatic cancer to brain ([place]) 04/27/2019  . Hypokalemia 04/27/2019    Orientation RESPIRATION BLADDER Height & Weight     Self, Time, Situation, Place  Normal Incontinent Weight: 130 lb 1.1 oz (59 kg) Height:  6' (182.9 cm)  BEHAVIORAL SYMPTOMS/MOOD NEUROLOGICAL BOWEL NUTRITION STATUS      Incontinent    AMBULATORY STATUS COMMUNICATION OF NEEDS Skin   Total Care Verbally Normal                       Personal Care Assistance Level of Assistance  Bathing, Feeding, Dressing Bathing Assistance: Maximum assistance Feeding assistance: Limited assistance Dressing Assistance: Maximum assistance     Functional Limitations Info             SPECIAL CARE FACTORS FREQUENCY  PT (By licensed PT), OT (By licensed OT)     PT Frequency: 5x/wk OT Frequency: 5x/wk            Contractures Contractures Info: Not present    Additional Factors Info  Code Status, Allergies Code Status Info: Full Allergies  Info: see MAR           Current Medications (03/13/2020):  This is the current hospital active medication list Current Facility-Administered Medications  Medication Dose Route Frequency Provider Last Rate Last Admin  . acetaminophen (TYLENOL) tablet 650 mg  650 mg Oral Q6H PRN Emokpae, Ejiroghene E, MD   650 mg at 03/12/20 0745   Or  . acetaminophen (TYLENOL) suppository 650 mg  650 mg Rectal Q6H PRN Emokpae, Ejiroghene E, MD      . Chlorhexidine Gluconate Cloth 2 % PADS 6 each  6 each Topical Daily Dwyane Dee, MD   6 each at 03/13/20 1015  . dexamethasone (DECADRON) tablet 4 mg  4 mg Oral TID Emokpae, Ejiroghene E, MD   4 mg at 03/13/20 1015  . heparin injection 5,000 Units  5,000 Units Subcutaneous Q8H Emokpae, Ejiroghene E, MD   5,000 Units at 03/13/20 0539  . HYDROcodone-acetaminophen (NORCO/VICODIN) 5-325 MG per tablet 1 tablet  1 tablet Oral Q8H PRN Emokpae, Ejiroghene E, MD      . HYDROmorphone (DILAUDID) injection 0.5 mg  0.5 mg Intravenous Q3H PRN Loistine Chance, MD      . levETIRAcetam (KEPPRA) tablet 500 mg  500 mg Oral BID Emokpae, Ejiroghene E, MD   500 mg at 03/13/20 1015  . polyethylene glycol (MIRALAX / GLYCOLAX) packet 17 g  17 g Oral BID Emokpae, Leanne Chang, MD  17 g at 03/13/20 1017  . senna-docusate (Senokot-S) tablet 2 tablet  2 tablet Oral QHS Emokpae, Ejiroghene E, MD   2 tablet at 03/12/20 2138  . sodium phosphate 30 mmol in dextrose 5 % 250 mL infusion  30 mmol Intravenous Once Dwyane Dee, MD 43 mL/hr at 03/13/20 1025 30 mmol at 03/13/20 1025     Discharge Medications: Please see discharge summary for a list of discharge medications.  Relevant Imaging Results:  Relevant Lab Results:   Additional Information SS# [id number]  Lennart Pall, LCSW

## 2020-03-13 NOTE — TOC Initial Note (Signed)
Transition of Care [facility]) - Initial/Assessment Note    Patient Details  Name: Brandon Ferguson MRN: [id number] Date of Birth: Aug 17, 1960  [facility] CM/SW Contact:    Lynnell Catalan, RN Phone Number: 03/13/2020, 3:24 PM  Clinical Narrative:                 This CM spoke with pt at bedside for DC planning. Pt faxed out for LTC placement per pt request. This CM was notified by APS worker Patriciaann Clan [phone]) that she has been working on his case in the community. She states that [facility] was interested in pt but would not be able to take him if he continues cancer treatment, due to cost. New Medicaid application done by [facility] as she states that his disability check has put him over the limit for the current Medicaid that he has listed in our system. If his dc plan evolves to a comfort approach without continued cancer treatment, [place] states that [facility] [place] [facility] would probably be interested in taking pt. TOC will continue to follow.    Activities of Daily Living Home Assistive Devices/Equipment: Gilford Rile (specify type) (front wheel) ADL Screening (condition at time of admission) Patient's cognitive ability adequate to safely complete daily activities?: Yes Is the patient deaf or have difficulty hearing?: No Does the patient have difficulty seeing, even when wearing glasses/contacts?: Yes ("I need glasses though", per pt) Does the patient have difficulty concentrating, remembering, or making decisions?: No Patient able to express need for assistance with ADLs?: Yes Does the patient have difficulty dressing or bathing?: Yes (Pt is unable to walk. Reports ongoing since a month now.) Independently performs ADLs?: No Communication: Independent Dressing (OT): Needs assistance Is this a change from baseline?: Pre-admission baseline Grooming: Needs assistance Is this a change from baseline?: Pre-admission baseline Feeding: Needs assistance Is this a change from  baseline?: Pre-admission baseline Bathing: Needs assistance Is this a change from baseline?: Pre-admission baseline Toileting: Needs assistance Is this a change from baseline?: Pre-admission baseline In/Out Bed: Dependent Is this a change from baseline?: Pre-admission baseline Walks in Home: Dependent Is this a change from baseline?: Pre-admission baseline Does the patient have difficulty walking or climbing stairs?: Yes Weakness of Legs: Both Weakness of Arms/Hands: None  Permission Sought/Granted                  Emotional Assessment              Admission diagnosis:  Metastatic cancer ([place]) [C79.9] Chronic constipation [K59.09] 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 Constipation 03/12/2020  . Paraplegia ([place]) 03/11/2020  . 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 Port-A-Cath in place 06/17/2019  . Goals of care, counseling/discussion 06/16/2019  . Malignant neoplasm of right upper lobe of lung ([place])   . Lung mass 04/28/2019  . Metastatic cancer to brain ([place]) 04/27/2019  . Hypokalemia 04/27/2019   PCP:  Patient, No Pcp Per Pharmacy:   [facility] [place] [facility] [place] [address] [place] [address] Phone: [phone] Fax: [phone]     Social Determinants of Health (SDOH) Interventions    Readmission Risk Interventions No flowsheet data found.

## 2020-03-13 NOTE — Progress Notes (Signed)
Palliative Care Progress Note  Brandon Ferguson appears stronger today. The steroids have significantly boosted his appetite and he is eating all of his meals and also snacks in between. He is content and responding well to the care he is receiving in the hospital and having his basic needs met- he is clean, comfortable, getting assistance with ADLs , his pain is controlled, he is receiving scheduled radiation. He appears much less depressed and distressed than when I saw him yesterday. He has no movement in his legs-but sensation is intact -he tells me that he cannot take heavy covers on his legs only a light sheet.  He is able to recall our conversation from yesterday. I spoke with his sister Apolonio Schneiders and Margaretha Sheffield by phone at his bedside. I updated them on his current condition, the severity of his illness and that he would likely not be able to regain the ability to walk again. I requested that we meet in person to solidify goals of care including ACP and code status discussion-I stressed to Mr. Cogdell that we must know who he wants to make decisions for him in the event he is unable to make them for himself and that we needed to discuss his care preferences given this terminal diagnosis-and his rapid progression of cancer in his spine.  Hi sisters cannot meet until Friday. I appreciate CMRN and CSW pursuing LTC and also for the APS information- he has been in distress at home and unable to live alone. I will contact Dr. Priscille Heidelberg tomorrow to discuss whether or not he would offer chemotherapy under these conditions and especially given the challenges he is facing with support and care and now significant loss of function. If additional chemotherapy will not improve his QOL and is not offered I would strongly recommend accepting the bed offer at [facility] and having extra layer of support with hospice. Will also further discuss his radiation treatment plan and see if it is possible to condense his treatments -  his pain is remarkably well controlled and radiation is likely contributing to this.  I have tentatively arranged for a meeting at 1030 on Friday with his family but explained if we are able to find placement for him and have a safe discharge plan then he may not remain in the hospital. I also committed to communicating with them by phone but I believe it will be necessary and most effective to do a full goals of care meeting in person. Their main goal is to find a nursing facility for him. I reiterated that we would not send him home alone in his current condition and unable to walk. She tells me that he was sent home from the ED with constipation and go-lytely bowel prep but couldn't walk at that time and no way to get himself to the bathroom, so they are concerned something like this will happen again- I tried to provide reassurance and encouraged open and honest dialogue about concerns and barriers to caring for him.  Will continue to follow. Plan to again address code status since he is feeling better and also introduce hospice concept.  Lane Hacker, DO Palliative Medicine  Time: 35 min Greater than 50%  of this time was spent counseling and coordinating care related to the above assessment and plan.

## 2020-03-13 NOTE — Progress Notes (Signed)
PROGRESS NOTE    Brandon Ferguson   FYB:[id number]  DOB: [birth year]  DOA: 03/11/2020     2  PCP: Brandon Ferguson, No Pcp Per  CC: constipation  Hospital Course: He wasMr. Ferguson is a 59 y.o. 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  Brandon Ferguson presented to the ED with complaints of not been able to walk that has progressed over the past month.  He also has been constipated and unable to have a bowel movement in at least 9 days.  He is unable to eat, and reports vomiting once.  Brandon Ferguson called his sister telling her that he was going to blow off his head with a gun and or take an overdose of pills. Brandon Ferguson sister called the police, when police arrived on scene there was no gun in the room.   On my questioning Brandon Ferguson, Brandon Ferguson tells me he was just talking out of his head, he is not suicidal and does not plan to take his life.  He tells me he could not have shot himself because he cannot walk and his gun was not in the room.  Per notes Brandon Ferguson reported that he said this so that he can get to the hospital, and did not want to state the specific reason why he needed to be in the hospital.   Brandon Ferguson was in the ED the day PTA for constipation, he was prescribed GoLYTELY.  Brandon Ferguson says he was unable to take the medication as he has not been able to walk he is currently wearing diapers for incontinence.   ED Course: Stable vitals.  WBC 13.4.  Sodium 133.  Potassium 3.5. EDP reviewed recent imaging findings-MRI done 03/07/2020.  Talked to [name], who recommended urgent transfer to [facility] for radiation therapy, considering rapid progression of symptoms. EDP also talked to neurosurgery, Dr. [name], Brandon Ferguson not a surgical candidate.  He was evaluated by palliative care on 10/4; overall he wishes for family discussions to take place between [name] and them regarding his next steps of care. Tried discussing his CODE STATUS further with him on 03/13/2020 and he was still  in opposition to making a decision.  He did wish to consider going to a SNF but was not wanting hospice at this time.  He continues to state that he wants to "keep living".  However he also states that he wants quality of life more than anything.   Interval History:  Resting in bed comfortably this morning with no pain and in no distress.  Still has significant weakness in his lower extremities.  States he ate rather well yesterday. Tried discussing goals of care and what he would want going forward; he became upset and said that we needed to talk to his family.  I tried telling him that he does have ability to make his own decisions and voice his opinions as well, however he did not wish to engage in conversation much.  He does endorse that he feels safer going to a skilled nursing facility rather than back home.  However, he still wants ongoing treatment it sounds like and definitely voiced his opinion of being against hospice at this time.  Old records reviewed in assessment of this Brandon Ferguson  ROS: Constitutional: positive for fatigue and malaise, Respiratory: negative, Cardiovascular: negative for chest pain and Gastrointestinal: positive for constipation  Assessment & Plan: * Malignant neoplasm of right upper lobe of lung ([place]) - barely 1/5 strength in bilateral lower extremity, urinary incontinence,  recent MRI lumbar and thoracic spine 9/27,9/29-redemonstrated widespread osseous metastatic disease throughout the visualized lumbar sacral spine and bilateral iliac bones, progression of metastasis to the L2 vertebral body with extraosseous extension, pathologic T1 compression fracture with bulky retropulsion of bone and or tumor resulting in moderate cord compression (see detailed report). - EDP talked to [name], recommended urgent [name] transfer, EDP also talked to neurosurgery, Brandon Ferguson not a surgical candidate. - follow up rad onc eval - overall his prognosis is poor and he is  approaching end of life imminently; if care is de-escalated then sooner but ultimately he is declining and will not thrive much longer - I believe palliative care is moreso priority at this time over any other treatments and pursuing comfort care/end of life care is more likely to bring Brandon Ferguson relief over other treatments. Ongoing aggressive care is starting to seem futile given his current status - d/c suicide precautions; he is not suicidal and denies further ideation -Further conversation had on 03/13/2020, he still does not want to talk about his CODE STATUS.  He does at least endorse wanting to go to an SNF and not back home at this time   Constipation - chronic opioids - failed outpatient attempt  - will await further [place] discussions before pursuing aggressive regimen as likely still futile   Paraplegia ([name]) - see cancer  Bone metastasis ([name]) - see cancer  Metastatic cancer to brain [name]) - follows with [name].  Currently on chemotherapy, and palliative radiation therapy.  Reports no recent seizures.  Brandon Ferguson denies suicidal ideation at this time, but he voiced intentions of same prior to admission. -Seizure precautions; Resume home Keppra   Antimicrobials: none  DVT prophylaxis: HSQ Code Status: Full Family Communication: Palliative to discuss with family Disposition Plan: Status is: Inpatient  Remains inpatient appropriate because:Unsafe d/c plan, IV treatments appropriate due to intensity of illness or inability to take PO and Inpatient level of care appropriate due to severity of illness   Dispo: The Brandon Ferguson is from: Home              Anticipated d/c is to: pending clinical course and [place] discussions              Anticipated d/c date is: 2 days              Brandon Ferguson currently is not medically stable to d/c.  Objective: Blood pressure 126/83, pulse 73, temperature 97.9 F (36.6 C), temperature source Oral, resp. rate 15, height 6' (1.829 m), weight 59 kg,  SpO2 100 %.  Examination: General appearance: cachectic and chronically ill man laying in bed in no distress Head: Normocephalic, without obvious abnormality, atraumatic Eyes: EOMI Lungs: clear to auscultation bilaterally Heart: regular rate and rhythm and S1, S2 normal Abdomen: normal findings: bowel sounds normal and soft, non-tender Extremities: no edema Skin: mobility and turgor normal Neurologic: 1/5 LE strength; 4/5 upper extremity strength  Consultants:   Rad onc  Palliative care  Procedures:   none  Data Reviewed: I have personally reviewed following labs and imaging studies Results for orders placed or performed during the hospital encounter of 03/11/20 (from the past 24 hour(s))  Basic metabolic panel     Status: Abnormal   Collection Time: 03/13/20  5:38 AM  Result Value Ref Range   Sodium 135 135 - 145 mmol/L   Potassium 4.4 3.5 - 5.1 mmol/L   Chloride 99 98 - 111 mmol/L   CO2 26 22 - 32  mmol/L   Glucose, Bld 132 (H) 70 - 99 mg/dL   BUN 13 6 - 20 mg/dL   Creatinine, Ser 0.41 (L) 0.61 - 1.24 mg/dL   Calcium 9.7 8.9 - 10.3 mg/dL   GFR calc non Af Amer >60 >60 mL/min   GFR calc Af Amer >60 >60 mL/min   Anion gap 10 5 - 15  CBC with Differential/Platelet     Status: Abnormal   Collection Time: 03/13/20  5:38 AM  Result Value Ref Range   WBC 10.6 (H) 4.0 - 10.5 K/uL   RBC 2.75 (L) 4.22 - 5.81 MIL/uL   Hemoglobin 7.2 (L) 13.0 - 17.0 g/dL   HCT 24.2 (L) 39 - 52 %   MCV 88.0 80.0 - 100.0 fL   MCH 26.2 26.0 - 34.0 pg   MCHC 29.8 (L) 30.0 - 36.0 g/dL   RDW 19.9 (H) 11.5 - 15.5 %   Platelets 234 150 - 400 K/uL   nRBC 0.0 0.0 - 0.2 %   Neutrophils Relative % 89 %   Neutro Abs 9.4 (H) 1.7 - 7.7 K/uL   Lymphocytes Relative 5 %   Lymphs Abs 0.5 (L) 0.7 - 4.0 K/uL   Monocytes Relative 5 %   Monocytes Absolute 0.6 0 - 1 K/uL   Eosinophils Relative 0 %   Eosinophils Absolute 0.0 0 - 0 K/uL   Basophils Relative 0 %   Basophils Absolute 0.0 0 - 0 K/uL   Immature  Granulocytes 1 %   Abs Immature Granulocytes 0.09 (H) 0.00 - 0.07 K/uL  Magnesium     Status: None   Collection Time: 03/13/20  5:38 AM  Result Value Ref Range   Magnesium 2.0 1.7 - 2.4 mg/dL  Phosphorus     Status: Abnormal   Collection Time: 03/13/20  5:38 AM  Result Value Ref Range   Phosphorus 2.3 (L) 2.5 - 4.6 mg/dL    Recent Results (from the past 240 hour(s))  Respiratory Panel by RT PCR (Flu A&B, Covid) - Nasopharyngeal Swab     Status: None   Collection Time: 03/11/20  3:59 PM   Specimen: Nasopharyngeal Swab  Result Value Ref Range Status   SARS Coronavirus 2 by RT PCR NEGATIVE NEGATIVE Final    Comment: (NOTE) SARS-CoV-2 target nucleic acids are NOT DETECTED.  The SARS-CoV-2 RNA is generally detectable in upper respiratoy specimens during the acute phase of infection. The lowest concentration of SARS-CoV-2 viral copies this assay can detect is 131 copies/mL. A negative result does not preclude SARS-Cov-2 infection and should not be used as the sole basis for treatment or other Brandon Ferguson management decisions. A negative result may occur with  improper specimen collection/handling, submission of specimen other than nasopharyngeal swab, presence of viral mutation(s) within the areas targeted by this assay, and inadequate number of viral copies (<131 copies/mL). A negative result must be combined with clinical observations, Brandon Ferguson history, and epidemiological information. The expected result is Negative.  Fact Sheet for Patients:  PinkCheek.be  Fact Sheet for Healthcare Providers:  GravelBags.it  This test is no t yet approved or cleared by the [place] FDA and  has been authorized for detection and/or diagnosis of SARS-CoV-2 by FDA under an Emergency Use Authorization (EUA). This EUA will remain  in effect (meaning this test can be used) for the duration of the COVID-19 declaration under Section 564(b)(1)  of the Act, 21 U.S.C. section 360bbb-3(b)(1), unless the authorization is terminated or revoked sooner.  Influenza A by PCR NEGATIVE NEGATIVE Final   Influenza B by PCR NEGATIVE NEGATIVE Final    Comment: (NOTE) The Xpert Xpress SARS-CoV-2/FLU/RSV assay is intended as an aid in  the diagnosis of influenza from Nasopharyngeal swab specimens and  should not be used as a sole basis for treatment. Nasal washings and  aspirates are unacceptable for Xpert Xpress SARS-CoV-2/FLU/RSV  testing.  Fact Sheet for Patients: PinkCheek.be  Fact Sheet for Healthcare Providers: GravelBags.it  This test is not yet approved or cleared by the [place] FDA and  has been authorized for detection and/or diagnosis of SARS-CoV-2 by  FDA under an Emergency Use Authorization (EUA). This EUA will remain  in effect (meaning this test can be used) for the duration of the  Covid-19 declaration under Section 564(b)(1) of the Act, 21  U.S.C. section 360bbb-3(b)(1), unless the authorization is  terminated or revoked. Performed at [facility], [address], [place], [place] [address]      Radiology Studies: No results found. No orders to display    Scheduled Meds: . Chlorhexidine Gluconate Cloth  6 each Topical Daily  . dexamethasone  4 mg Oral TID  . heparin  5,000 Units Subcutaneous Q8H  . levETIRAcetam  500 mg Oral BID  . polyethylene glycol  17 g Oral BID  . senna-docusate  2 tablet Oral QHS   PRN Meds: acetaminophen **OR** acetaminophen, HYDROcodone-acetaminophen, HYDROmorphone (DILAUDID) injection Continuous Infusions: . sodium phosphate  Dextrose 5% IVPB 30 mmol (03/13/20 1025)      LOS: 2 days  Time spent: Greater than 50% of the 35 minute visit was spent in counseling/coordination of care for the Brandon Ferguson as laid out in the A&P.   Dwyane Dee, MD [facility] 03/13/2020, 2:28 PM  Contact via secure chat.  To  contact the attending provider between 7A-7P or the covering provider during after hours 7P-7A, please log into the web site [email or web address] and access using universal  password for that web site. If you do not have the password, please call the hospital operator.

## 2020-03-13 NOTE — Telephone Encounter (Signed)
[facility] CSW Progress Notes  Called [place], cancelled all further rides.  These will need to be rescheduled w [facility] when his discharge is planned and new outpatient appts are scheduled.  Cancellation conf #s are 10/6 - [id number], 10/7 - 37793, 10/8:  96886, 10/8:  48472, 10/11: 07218, 10/12: 28833, 10/13:  74451, 10/14:  46047.  Rides must be rescheduled w [facility] when patient is ready for discharge and new appointment times have been confirmed.  Edwyna Shell, LCSW Clinical Social Worker Phone:  [phone]

## 2020-03-14 ENCOUNTER — Ambulatory Visit
Admission: RE | Admit: 2020-03-14 | Discharge: 2020-03-14 | Disposition: A | Discharge status: Home / Self Care | Payer: Medicaid Other | Source: Ambulatory Visit | Attending: Radiation Oncology | Admitting: Radiation Oncology

## 2020-03-14 DIAGNOSIS — K5909 Other constipation: Secondary | ICD-10-CM | POA: Diagnosis not present

## 2020-03-14 DIAGNOSIS — G834 Cauda equina syndrome: Secondary | ICD-10-CM

## 2020-03-14 DIAGNOSIS — D649 Anemia, unspecified: Secondary | ICD-10-CM

## 2020-03-14 DIAGNOSIS — C7951 Secondary malignant neoplasm of bone: Secondary | ICD-10-CM | POA: Diagnosis not present

## 2020-03-14 DIAGNOSIS — C3411 Malignant neoplasm of upper lobe, right bronchus or lung: Secondary | ICD-10-CM | POA: Diagnosis not present

## 2020-03-14 LAB — CBC WITH DIFFERENTIAL/PLATELET
Abs Immature Granulocytes: 0.16 10*3/uL — ABNORMAL HIGH (ref 0.00–0.07)
Basophils Absolute: 0 10*3/uL (ref 0.0–0.1)
Basophils Relative: 0 %
Eosinophils Absolute: 0 10*3/uL (ref 0.0–0.5)
Eosinophils Relative: 0 %
HCT: 27.3 % — ABNORMAL LOW (ref 39.0–52.0)
Hemoglobin: 8.1 g/dL — ABNORMAL LOW (ref 13.0–17.0)
Immature Granulocytes: 1 %
Lymphocytes Relative: 4 %
Lymphs Abs: 0.7 10*3/uL (ref 0.7–4.0)
MCH: 25.9 pg — ABNORMAL LOW (ref 26.0–34.0)
MCHC: 29.7 g/dL — ABNORMAL LOW (ref 30.0–36.0)
MCV: 87.2 fL (ref 80.0–100.0)
Monocytes Absolute: 0.5 10*3/uL (ref 0.1–1.0)
Monocytes Relative: 3 %
Neutro Abs: 14.4 10*3/uL — ABNORMAL HIGH (ref 1.7–7.7)
Neutrophils Relative %: 92 %
Platelets: 249 10*3/uL (ref 150–400)
RBC: 3.13 MIL/uL — ABNORMAL LOW (ref 4.22–5.81)
RDW: 20 % — ABNORMAL HIGH (ref 11.5–15.5)
WBC: 15.7 10*3/uL — ABNORMAL HIGH (ref 4.0–10.5)
nRBC: 0.1 % (ref 0.0–0.2)

## 2020-03-14 LAB — BASIC METABOLIC PANEL
Anion gap: 12 (ref 5–15)
BUN: 14 mg/dL (ref 6–20)
CO2: 25 mmol/L (ref 22–32)
Calcium: 9.8 mg/dL (ref 8.9–10.3)
Chloride: 96 mmol/L — ABNORMAL LOW (ref 98–111)
Creatinine, Ser: 0.42 mg/dL — ABNORMAL LOW (ref 0.61–1.24)
GFR calc non Af Amer: >60 mL/min (ref >60)
Glucose, Bld: 167 mg/dL — ABNORMAL HIGH (ref 70–99)
Potassium: 3.7 mmol/L (ref 3.5–5.1)
Sodium: 133 mmol/L — ABNORMAL LOW (ref 135–145)

## 2020-03-14 LAB — MAGNESIUM: Magnesium: 1.9 mg/dL (ref 1.7–2.4)

## 2020-03-14 LAB — PHOSPHORUS: Phosphorus: 2.6 mg/dL (ref 2.5–4.6)

## 2020-03-14 MED ORDER — POLYETHYLENE GLYCOL 3350 17 GM/SCOOP PO POWD
1.0000 | Freq: Once | ORAL | Status: AC
  Administered 2020-03-14: 255 g via ORAL
  Filled 2020-03-14: qty 255

## 2020-03-14 MED ORDER — BISACODYL 5 MG PO TBEC
5.0000 mg | DELAYED_RELEASE_TABLET | Freq: Once | ORAL | Status: AC
  Administered 2020-03-14: 5 mg via ORAL
  Filled 2020-03-14: qty 1

## 2020-03-14 MED ORDER — ONDANSETRON HCL 4 MG/2ML IJ SOLN: 4.0000 mg | Freq: Four times a day (QID) | INTRAMUSCULAR | Status: DC | PRN

## 2020-03-14 NOTE — Evaluation (Signed)
Occupational Therapy Evaluation Patient Details Name: Brandon Ferguson MRN: [id number] DOB: 01-12-1961 Today's Date: 03/14/2020    History of Present Illness 59 y.o. 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  Patient presented to the ED with complaints of not been able to walk that has progressed over the past month.  He also has been constipated and unable to have a bowel movement in at least 9 days.  He is unable to eat, and reports vomiting once. Imaging showed T1 compression fracture with cord compression.   Clinical Impression   Brandon Ferguson is a 59 year old male with above medical history who presents with decreased left shoulder ROM, decreased grip strength bilaterally, decreased ROM and strength of bilateral lower extremities, decreased fine motor coordination of bilateral hands, and impaired sitting balance resulting in significant decline in ability to perform transfers, ambulation and ADLs. At this time patient +2 to transfer to side of bed, required assistance to sit edge of bed while propping with bilateral upper extremities. Patient limited to predominantly to bed level for ADLs at this time. Patient will benefit from skilled OT services while in hospital to improve deficits and learn compensatory strategies in order to improve functional abilities and reduce caregiver burden. Patient provided with built up handle to assist with self feeding and provided with squeeze ball for grip strengthening.     Follow Up Recommendations  SNF    Equipment Recommendations  None recommended by OT;Other (comment) (Defer to next venue)    Recommendations for Other Services       Precautions / Restrictions Precautions Precautions: Fall Precaution Comments: paraplegic, multiple sclerotic spinal lesions with mild spinal compression Restrictions Weight Bearing Restrictions: No      Mobility Bed Mobility Overal bed mobility: Needs Assistance Bed  Mobility: Rolling;Sidelying to Sit;Sit to Sidelying Rolling: Total assist;+2 for physical assistance Sidelying to sit: Total assist;+2 for physical assistance     Sit to sidelying: Total assist;+2 for physical assistance General bed mobility comments: +2 total assist to advance LEs and to raise trunk; mod to max A for sitting balance due to posterior and R lateral lean, pt sat edge of bed x 8 minutes  Transfers                 General transfer comment: unable, would need mechanical lift to transfer    Balance Overall balance assessment: Needs assistance Sitting-balance support: Feet supported;Bilateral upper extremity supported Sitting balance-Leahy Scale: Zero Sitting balance - Comments: patient propping with BUEs and assistance from therapist. Postural control: Posterior lean;Right lateral lean                                 ADL either performed or assessed with clinical judgement   ADL Overall ADL's : Needs assistance/impaired Eating/Feeding: Set up;Bed level Eating/Feeding Details (indicate cue type and reason): Patient reports difficulty with feeding though he can do it. Provided patient with built up handle for self feeding. Patient exhibited ability to pick up styrofoam cup with lid and straw and drink without dropping. Grooming: Set up;Sitting;Wash/dry face Grooming Details (indicate cue type and reason): Able to wash face at side of bed - but with assistance for trunk stability and balance. Upper Body Bathing: Set up;Bed level   Lower Body Bathing: Maximal assistance;Bed level   Upper Body Dressing : Bed level;Minimal assistance   Lower Body Dressing: Total assistance;Bed level  Toilet Transfer Details (indicate cue type and reason): unable Toileting- Clothing Manipulation and Hygiene: Total assistance;Bed level               Vision Patient Visual Report: No change from baseline Vision Assessment?: No apparent visual deficits      Perception     Praxis      Pertinent Vitals/Pain Pain Assessment: No/denies pain     Hand Dominance     Extremity/Trunk Assessment Upper Extremity Assessment Upper Extremity Assessment: RUE deficits/detail;LUE deficits/detail RUE Deficits / Details: shoulder 5/5, bicep 5/5,  tricep 4/5, wrist 5/5, grip 3+/5 RUE Sensation:  (reports numbness/tingling in fingers) RUE Coordination: decreased fine motor LUE Deficits / Details: shoulder 3+/5, bicep 5/5,  tricep 4/5, wrist 5/5, grip 3+/5 LUE Sensation:  (numbness/tingling in fingers) LUE Coordination: decreased fine motor   Lower Extremity Assessment Lower Extremity Assessment: Defer to PT evaluation   Cervical / Trunk Assessment Cervical / Trunk Assessment: Normal   Communication Communication Communication: No difficulties   Cognition Arousal/Alertness: Awake/alert Behavior During Therapy: WFL for tasks assessed/performed Overall Cognitive Status: Within Functional Limits for tasks assessed                                     General Comments       Exercises Other Exercises Other Exercises: issued leg lifter, instructed pt in use for heel slides and ankle PF/DF PROM   Shoulder Instructions      Home Living Family/patient expects to be discharged to:: Skilled nursing facility Living Arrangements: Alone   Type of Home: House Home Access: Stairs to enter Technical brewer of Steps: 2   Home Layout: One level               Home Equipment: Walker - 2 wheels          Prior Functioning/Environment Level of Independence: Independent        Comments: hasn't walked in a month and a half, walked with RW prior to that, girlfriend was assisting with getting groceries but also cares for elderly parent and cannot provide needed care for pt        OT Problem List: Decreased strength;Decreased range of motion;Decreased activity tolerance;Impaired balance (sitting and/or standing);Decreased  knowledge of use of DME or AE;Impaired sensation      OT Treatment/Interventions: Self-care/ADL training;Therapeutic exercise;Neuromuscular education;DME and/or AE instruction;Therapeutic activities;Patient/family education;Balance training    OT Goals(Current goals can be found in the care plan section) Acute Rehab OT Goals Patient Stated Goal: wants to strengthen arms OT Goal Formulation: With patient Time For Goal Achievement: 03/28/20 Potential to Achieve Goals: Fair  OT Frequency: Min 2X/week   Barriers to D/C:            Co-evaluation PT/OT/SLP Co-Evaluation/Treatment: Yes Reason for Co-Treatment: Complexity of the patient's impairments (multi-system involvement);To address functional/ADL transfers;For patient/therapist safety PT goals addressed during session: Mobility/safety with mobility OT goals addressed during session: ADL's and self-care;Strengthening/ROM;Proper use of Adaptive equipment and DME      AM-PAC OT "6 Clicks" Daily Activity     Outcome Measure Help from another person eating meals?: A Little Help from another person taking care of personal grooming?: A Little Help from another person toileting, which includes using toliet, bedpan, or urinal?: Total Help from another person bathing (including washing, rinsing, drying)?: A Lot Help from another person to put on and taking off regular upper body clothing?: A  Lot Help from another person to put on and taking off regular lower body clothing?: Total 6 Click Score: 12   End of Session Nurse Communication: Mobility status  Activity Tolerance: Patient tolerated treatment well Patient left: in bed;with call bell/phone within reach;with bed alarm set  OT Visit Diagnosis: Other abnormalities of gait and mobility (R26.89);Muscle weakness (generalized) (M62.81);Other symptoms and signs involving the nervous system (R29.898)                Time: 1505-6979 OT Time Calculation (min): 28 min Charges:  OT General  Charges $OT Visit: 1 Visit OT Evaluation $OT Eval Moderate Complexity: 1 Mod  Aribella Vavra, OTR/L [place]  Office [phone] Pager: [place] 03/14/2020, 2:14 PM

## 2020-03-14 NOTE — Evaluation (Signed)
Physical Therapy Evaluation Patient Details Name: Brandon Ferguson MRN: [id number] DOB: 10/14/1960 Today's Date: 03/14/2020   History of Present Illness  59 y.o. 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  Patient presented to the ED with complaints of not been able to walk that has progressed over the past month.  He also has been constipated and unable to have a bowel movement in at least 9 days.  He is unable to eat, and reports vomiting once. Imaging showed T1 compression fracture with cord compression.  Clinical Impression  Pt admitted with above diagnosis. +2 total assist for bed mobility, pt sat at edge of bed for ~7 minutes with mod to max assist for sitting balance due to posterior lean. Issued pt a leg lifter and instructed him in using it for PROM to LEs. Spoke to RN and requested order for prevalon boots to protect heels and minimize foot drop.  Pt currently with functional limitations due to the deficits listed below (see PT Problem List). Pt will benefit from skilled PT to increase their independence and safety with mobility to allow discharge to the venue listed below.       Follow Up Recommendations SNF;Supervision for mobility/OOB;Supervision/Assistance - 24 hour    Equipment Recommendations  Wheelchair (measurements PT);Wheelchair cushion (measurements PT)    Recommendations for Other Services       Precautions / Restrictions Precautions Precautions: Fall Precaution Comments: paraplegic Restrictions Weight Bearing Restrictions: No      Mobility  Bed Mobility Overal bed mobility: Needs Assistance Bed Mobility: Rolling;Sidelying to Sit;Sit to Sidelying Rolling: Total assist;+2 for physical assistance Sidelying to sit: Total assist;+2 for physical assistance     Sit to sidelying: Total assist;+2 for physical assistance General bed mobility comments: +2 total assist to advance LEs and to raise trunk; mod to max A for sitting  balance due to posterior and R lateral lean, pt sat edge of bed x 8 minutes  Transfers                 General transfer comment: unable, would need mechanical lift to transfer  Ambulation/Gait                Stairs            Wheelchair Mobility    Modified Rankin (Stroke Patients Only)       Balance Overall balance assessment: Needs assistance   Sitting balance-Leahy Scale: Zero                                       Pertinent Vitals/Pain Pain Assessment: No/denies pain    Home Living Family/patient expects to be discharged to:: Skilled nursing facility Living Arrangements: Alone   Type of Home: House Home Access: Stairs to enter   [employer] of Steps: 2 Home Layout: One level Home Equipment: Walker - 2 wheels      Prior Function Level of Independence: Independent         Comments: hasn't walked in a month and a half, walked with RW prior to that, girlfriend was assisting with getting groceries but also cares for elderly parent and cannot provide needed care for pt     Hand Dominance        Extremity/Trunk Assessment   Upper Extremity Assessment Upper Extremity Assessment: Defer to OT evaluation    Lower Extremity Assessment Lower Extremity Assessment:  RLE deficits/detail;LLE deficits/detail RLE Deficits / Details: can wiggle R toes, hip flexion -2/5, 0/5 ankle DF/PF, knee ext -2/5 RLE Sensation: decreased light touch LLE Deficits / Details: no active movement other than trace flexion of 5th toe LLE Sensation: decreased light touch       Communication   Communication: No difficulties  Cognition Arousal/Alertness: Awake/alert Behavior During Therapy: WFL for tasks assessed/performed Overall Cognitive Status: Within Functional Limits for tasks assessed                                        General Comments      Exercises Other Exercises Other Exercises: issued leg lifter,  instructed pt in use for heel slides and ankle PF/DF PROM   Assessment/Plan    PT Assessment Patient needs continued PT services  PT Problem List Decreased activity tolerance;Decreased balance;Decreased knowledge of use of DME;Decreased mobility;Decreased strength       PT Treatment Interventions Therapeutic activities;Therapeutic exercise;Balance training;Functional mobility training;Patient/family education    PT Goals (Current goals can be found in the Care Plan section)  Acute Rehab PT Goals Patient Stated Goal: wants to strengthen arms PT Goal Formulation: With patient Time For Goal Achievement: 03/28/20 Potential to Achieve Goals: Fair    Frequency Min 2X/week   Barriers to discharge        Co-evaluation PT/OT/SLP Co-Evaluation/Treatment: Yes Reason for Co-Treatment: Complexity of the patient's impairments (multi-system involvement);For patient/therapist safety;To address functional/ADL transfers PT goals addressed during session: Mobility/safety with mobility;Balance;Strengthening/ROM         AM-PAC PT "6 Clicks" Mobility  Outcome Measure Help needed turning from your back to your side while in a flat bed without using bedrails?: Total Help needed moving from lying on your back to sitting on the side of a flat bed without using bedrails?: Total Help needed moving to and from a bed to a chair (including a wheelchair)?: Total Help needed standing up from a chair using your arms (e.g., wheelchair or bedside chair)?: Total Help needed to walk in hospital room?: Total Help needed climbing 3-5 steps with a railing? : Total 6 Click Score: 6    End of Session   Activity Tolerance: Patient tolerated treatment well Patient left: in bed;with call bell/phone within reach;with bed alarm set Nurse Communication: Mobility status;Other (comment) (requested order for prevalon boots) PT Visit Diagnosis: Muscle weakness (generalized) (M62.81);Difficulty in walking, not elsewhere  classified (R26.2);Adult, failure to thrive (R62.7)    Time: 3557-3220 PT Time Calculation (min) (ACUTE ONLY): 36 min   Charges:   PT Evaluation $PT Eval Moderate Complexity: 1 Mod          Philomena Doheny PT 03/14/2020  Acute Rehabilitation Services Pager [phone] Office [phone]

## 2020-03-14 NOTE — Progress Notes (Signed)
PROGRESS NOTE    Brandon Ferguson  YIR:[id number] DOB: 08-06-1960 DOA: 03/11/2020 PCP: Patient, No Pcp Per    Brief Narrative:  59 y.o.malewith medical history significant formetastatic lung cancer to brain and spine. Patient presented to the ED with complaints of not been able to walkthat has progressed over the past month.   Assessment & Plan:   Principal Problem:   Malignant neoplasm of right upper lobe of lung (HCC) Active Problems:   Metastatic cancer to brain ([place])   Bone metastasis (HCC)   Paraplegia (HCC)   Constipation  * Malignant neoplasm of right upper lobe of lung ([place]) - barely 1/5strength in bilateral lower extremity,urinary incontinence,recent MRI lumbar and thoracic spine 9/27,9/29-redemonstrated widespread osseous metastatic disease throughout the visualized lumbar sacral spine and bilateral iliac bones, progression of metastasis to the L2 vertebral body with extraosseous extension,pathologicT1 compression fracture with bulky retropulsion of bone and or tumor resulting in moderate cord compression(see detailed report). - EDPtalked to [name], recommended urgent [name] transfer, EDPalso talked to neurosurgery, patient not a surgical candidate. - overall his prognosis is poor  -Appreciate assistance by Palliative Care - Outpatient notes from Oncology reviewed. As of 03/07/20, based on his condition at that time, which seems similar if not worse than now, pt would need palliative care with hospice. In the event that the patient's condition improved, then there would be consideration for docetaxel  Constipation - currently with chronic opioids - Pt reports historically responding with bowel prep and has requested a bowel prep. Will order  Paraplegia [facility]) - see cancer  Bone metastasis ([place]) - see cancer  Metastatic cancer to brain [facility]) - follows with [name]. Currently on chemotherapy,and palliative radiation therapy. Reports no  recent seizures. See above, outpt records mentioned recs for Palliaitve Care with hospice given condition in 9/21 -Seizure precautions; Resume home Keppra   DVT prophylaxis: Heparin subq Code Status: Full Family Communication: Pt in room, family not at bedside  Status is: Inpatient  Remains inpatient appropriate because:Unsafe d/c plan and Inpatient level of care appropriate due to severity of illness   Dispo: The patient is from: Home              Anticipated d/c is to: SNF              Anticipated d/c date is: 3 days              Patient currently is not medically stable to d/c.       Consultants:  Rad onc Palliative Care  Procedures:     Antimicrobials: Anti-infectives (From admission, onward)   None       Subjective: Complaining of constipation, nausea. States last bm was 2 days ago  Objective: Vitals:   03/13/20 1355 03/13/20 2113 03/14/20 0512 03/14/20 1338  BP: 126/83 134/85 130/82 (!) 155/96  Pulse: 73 75 76 84  Resp: 15 14 16 15   Temp: 97.9 F (36.6 C) 98.5 F (36.9 C) 98.1 F (36.7 C) 98.2 F (36.8 C)  TempSrc: Oral Oral Oral Oral  SpO2: 100% 100% 100% 99%  Weight:      Height:        Intake/Output Summary (Last 24 hours) at 03/14/2020 1715 Last data filed at 03/14/2020 1409 Gross per 24 hour  Intake 240 ml  Output 2000 ml  Net -1760 ml   Filed Weights   03/11/20 1549  Weight: 59 kg    Examination:  General exam: Appears calm and comfortable  Respiratory  system: Clear to auscultation. Respiratory effort normal. Cardiovascular system: S1 & S2 heard, Regular Gastrointestinal system: decreased bs, nondistended Central nervous system: Alert and oriented. No focal neurological deficits. Extremities: Symmetric 5 x 5 power. Skin: No rashes, lesions Psychiatry: Judgement and insight appear normal. Mood & affect appropriate.   Data Reviewed: I have personally reviewed following labs and imaging studies  CBC: Recent Labs  Lab  03/11/20 1731 03/12/20 0916 03/13/20 0538 03/14/20 0848  WBC 13.4* 7.4 10.6* 15.7*  NEUTROABS 11.1*  --  9.4* 14.4*  HGB 7.5* 8.0* 7.2* 8.1*  HCT 25.0* 27.7* 24.2* 27.3*  MCV 85.3 89.1 88.0 87.2  PLT 208 203 234 295   Basic Metabolic Panel: Recent Labs  Lab 03/11/20 1731 03/12/20 0916 03/13/20 0538 03/14/20 0848  NA 133* 134* 135 133*  K 3.5 4.6 4.4 3.7  CL 95* 98 99 96*  CO2 25 23 26 25   GLUCOSE 103* 222* 132* 167*  BUN 17 17 13 14   CREATININE 0.40* 0.40* 0.41* 0.42*  CALCIUM 9.9 9.6 9.7 9.8  MG  --   --  2.0 1.9  PHOS  --   --  2.3* 2.6   GFR: Estimated Creatinine Clearance: 81.9 mL/min (A) (by C-G formula based on SCr of 0.42 mg/dL (L)). Liver Function Tests: Recent Labs  Lab 03/11/20 1731  AST 23  ALT 17  ALKPHOS 322*  BILITOT 0.8  PROT 8.0  ALBUMIN 2.9*   No results for input(s): LIPASE, AMYLASE in the last 168 hours. No results for input(s): AMMONIA in the last 168 hours. Coagulation Profile: No results for input(s): INR, PROTIME in the last 168 hours. Cardiac Enzymes: No results for input(s): CKTOTAL, CKMB, CKMBINDEX, TROPONINI in the last 168 hours. BNP (last 3 results) No results for input(s): PROBNP in the last 8760 hours. HbA1C: No results for input(s): HGBA1C in the last 72 hours. CBG: No results for input(s): GLUCAP in the last 168 hours. Lipid Profile: No results for input(s): CHOL, HDL, LDLCALC, TRIG, CHOLHDL, LDLDIRECT in the last 72 hours. Thyroid Function Tests: No results for input(s): TSH, T4TOTAL, FREET4, T3FREE, THYROIDAB in the last 72 hours. Anemia Panel: No results for input(s): VITAMINB12, FOLATE, FERRITIN, TIBC, IRON, RETICCTPCT in the last 72 hours. Sepsis Labs: No results for input(s): PROCALCITON, LATICACIDVEN in the last 168 hours.  Recent Results (from the past 240 hour(s))  Respiratory Panel by RT PCR (Flu A&B, Covid) - Nasopharyngeal Swab     Status: None   Collection Time: 03/11/20  3:59 PM   Specimen:  Nasopharyngeal Swab  Result Value Ref Range Status   SARS Coronavirus 2 by RT PCR NEGATIVE NEGATIVE Final    Comment: (NOTE) SARS-CoV-2 target nucleic acids are NOT DETECTED.  The SARS-CoV-2 RNA is generally detectable in upper respiratoy specimens during the acute phase of infection. The lowest concentration of SARS-CoV-2 viral copies this assay can detect is 131 copies/mL. A negative result does not preclude SARS-Cov-2 infection and should not be used as the sole basis for treatment or other patient management decisions. A negative result may occur with  improper specimen collection/handling, submission of specimen other than nasopharyngeal swab, presence of viral mutation(s) within the areas targeted by this assay, and inadequate number of viral copies (<131 copies/mL). A negative result must be combined with clinical observations, patient history, and epidemiological information. The expected result is Negative.  Fact Sheet for Patients:  PinkCheek.be  Fact Sheet for Healthcare Providers:  GravelBags.it  This test is no t yet approved or  cleared by the [place] and  has been authorized for detection and/or diagnosis of SARS-CoV-2 by FDA under an Emergency Use Authorization (EUA). This EUA will remain  in effect (meaning this test can be used) for the duration of the COVID-19 declaration under Section 564(b)(1) of the Act, 21 U.S.C. section 360bbb-3(b)(1), unless the authorization is terminated or revoked sooner.     Influenza A by PCR NEGATIVE NEGATIVE Final   Influenza B by PCR NEGATIVE NEGATIVE Final    Comment: (NOTE) The Xpert Xpress SARS-CoV-2/FLU/RSV assay is intended as an aid in  the diagnosis of influenza from Nasopharyngeal swab specimens and  should not be used as a sole basis for treatment. Nasal washings and  aspirates are unacceptable for Xpert Xpress SARS-CoV-2/FLU/RSV  testing.  Fact Sheet  for Patients: PinkCheek.be  Fact Sheet for Healthcare Providers: GravelBags.it  This test is not yet approved or cleared by the [place] FDA and  has been authorized for detection and/or diagnosis of SARS-CoV-2 by  FDA under an Emergency Use Authorization (EUA). This EUA will remain  in effect (meaning this test can be used) for the duration of the  Covid-19 declaration under Section 564(b)(1) of the Act, 21  U.S.C. section 360bbb-3(b)(1), unless the authorization is  terminated or revoked. Performed at [facility], [address], [place], [place] [address]      Radiology Studies: No results found.  Scheduled Meds: . Chlorhexidine Gluconate Cloth  6 each Topical Daily  . dexamethasone  4 mg Oral TID  . feeding supplement (ENSURE ENLIVE)  237 mL Oral BID BM  . heparin  5,000 Units Subcutaneous Q8H  . levETIRAcetam  500 mg Oral BID  . senna-docusate  2 tablet Oral QHS   Continuous Infusions:   LOS: 3 days   Marylu Lund, MD [facility] Pager On Amion  If 7PM-7AM, please contact night-coverage 03/14/2020, 5:15 PM

## 2020-03-15 ENCOUNTER — Ambulatory Visit
Admission: RE | Admit: 2020-03-15 | Discharge: 2020-03-15 | Disposition: A | Discharge status: Home / Self Care | Payer: Medicaid Other | Source: Ambulatory Visit | Attending: Radiation Oncology | Admitting: Radiation Oncology

## 2020-03-15 DIAGNOSIS — C3411 Malignant neoplasm of upper lobe, right bronchus or lung: Secondary | ICD-10-CM | POA: Diagnosis not present

## 2020-03-15 DIAGNOSIS — G834 Cauda equina syndrome: Secondary | ICD-10-CM | POA: Diagnosis not present

## 2020-03-15 DIAGNOSIS — K5909 Other constipation: Secondary | ICD-10-CM | POA: Diagnosis not present

## 2020-03-15 DIAGNOSIS — C7951 Secondary malignant neoplasm of bone: Principal | ICD-10-CM

## 2020-03-15 DIAGNOSIS — D649 Anemia, unspecified: Secondary | ICD-10-CM | POA: Diagnosis not present

## 2020-03-15 LAB — BASIC METABOLIC PANEL
Anion gap: 15 (ref 5–15)
BUN: 17 mg/dL (ref 6–20)
CO2: 26 mmol/L (ref 22–32)
Calcium: 10.3 mg/dL (ref 8.9–10.3)
Chloride: 93 mmol/L — ABNORMAL LOW (ref 98–111)
Creatinine, Ser: 0.38 mg/dL — ABNORMAL LOW (ref 0.61–1.24)
GFR calc non Af Amer: >60 mL/min (ref >60)
Glucose, Bld: 107 mg/dL — ABNORMAL HIGH (ref 70–99)
Potassium: 4.5 mmol/L (ref 3.5–5.1)
Sodium: 134 mmol/L — ABNORMAL LOW (ref 135–145)

## 2020-03-15 LAB — CBC WITH DIFFERENTIAL/PLATELET
Abs Immature Granulocytes: 0.14 10*3/uL — ABNORMAL HIGH (ref 0.00–0.07)
Basophils Absolute: 0 10*3/uL (ref 0.0–0.1)
Basophils Relative: 0 %
Eosinophils Absolute: 0 10*3/uL (ref 0.0–0.5)
Eosinophils Relative: 0 %
HCT: 27.4 % — ABNORMAL LOW (ref 39.0–52.0)
Hemoglobin: 8.3 g/dL — ABNORMAL LOW (ref 13.0–17.0)
Immature Granulocytes: 1 %
Lymphocytes Relative: 3 %
Lymphs Abs: 0.4 10*3/uL — ABNORMAL LOW (ref 0.7–4.0)
MCH: 26 pg (ref 26.0–34.0)
MCHC: 30.3 g/dL (ref 30.0–36.0)
MCV: 85.9 fL (ref 80.0–100.0)
Monocytes Absolute: 1.1 10*3/uL — ABNORMAL HIGH (ref 0.1–1.0)
Monocytes Relative: 7 %
Neutro Abs: 13.3 10*3/uL — ABNORMAL HIGH (ref 1.7–7.7)
Neutrophils Relative %: 89 %
Platelets: 279 10*3/uL (ref 150–400)
RBC: 3.19 MIL/uL — ABNORMAL LOW (ref 4.22–5.81)
RDW: 20.2 % — ABNORMAL HIGH (ref 11.5–15.5)
WBC: 14.9 10*3/uL — ABNORMAL HIGH (ref 4.0–10.5)
nRBC: 0 % (ref 0.0–0.2)

## 2020-03-15 LAB — PHOSPHORUS: Phosphorus: 3.3 mg/dL (ref 2.5–4.6)

## 2020-03-15 LAB — MAGNESIUM: Magnesium: 2 mg/dL (ref 1.7–2.4)

## 2020-03-15 NOTE — Progress Notes (Signed)
Meeting with Patient and his family planned for 1030AM on 10/8 to discuss his condition, share prognostic information and discuss options for nursing facility placement with the support of hospice services. I have communicated with [name] who also endorses this plan of care.   Lane Hacker, DO Palliative Medicine

## 2020-03-15 NOTE — Progress Notes (Signed)
PROGRESS NOTE    Brandon Ferguson  TOI:[id number] DOB: [birth year] DOA: 03/11/2020 PCP: Brandon Ferguson, No Pcp Per    Brief Narrative:  59 y.o.malewith medical history significant formetastatic lung cancer to brain and spine. Brandon Ferguson presented to the ED with complaints of not been able to walkthat has progressed over the past month.   Assessment & Plan:   Principal Problem:   Malignant neoplasm of right upper lobe of lung (HCC) Active Problems:   Metastatic cancer to brain ([place])   Bone metastasis (HCC)   Paraplegia (HCC)   Constipation  * Malignant neoplasm of right upper lobe of lung ([place]) - barely 1/5strength in bilateral lower extremity,urinary incontinence,recent MRI lumbar and thoracic spine 9/27,9/29-redemonstrated widespread osseous metastatic disease throughout the visualized lumbar sacral spine and bilateral iliac bones, progression of metastasis to the L2 vertebral body with extraosseous extension,pathologicT1 compression fracture with bulky retropulsion of bone and or tumor resulting in moderate cord compression(see detailed report). -Appreciate assistance by Palliative Care -discussed with Dr. Hilma Favors who had discussed with Dr. Tommi Rumps. Family meeting planned for 10/8 noted   Constipation - currently with chronic opioids - now with bowel movements with cathartics, will continue  Paraplegia ([place]) - likely secondary to above metastatic cancer  Bone metastasis ([place]) - secondary to above neoplasm  Metastatic cancer to brain [facility]) - follows with [name]. Currently on chemotherapy,and palliative radiation therapy. Reports no recent seizures.  -Palliative Care following, per above, family meeting scheduled for 10/8 to discuss options and plan. As noted, Dr. Hilma Favors did discuss with Dr. Tommi Rumps   DVT prophylaxis: Heparin subq Code Status: Full Family Communication: Pt in room, family not at bedside  Status is: Inpatient  Remains inpatient  appropriate because:Unsafe d/c plan and Inpatient level of care appropriate due to severity of illness   Dispo: The Brandon Ferguson is from: Home              Anticipated d/c is to: SNF              Anticipated d/c date is: 3 days              Brandon Ferguson currently is not medically stable to d/c.   Consultants:  Rad onc Palliative Care  Procedures:     Antimicrobials: Anti-infectives (From admission, onward)   None      Subjective: Now having bowel movement, responded to cathartic  Objective: Vitals:   03/14/20 1338 03/14/20 2059 03/15/20 0543 03/15/20 1329  BP: (!) 155/96 (!) 133/95 120/82 133/89  Pulse: 84 94 90 94  Resp: 15  14 16   Temp: 98.2 F (36.8 C) 98 F (36.7 C) 97.8 F (36.6 C) 98.4 F (36.9 C)  TempSrc: Oral Oral Oral   SpO2: 99% 99% 100% 100%  Weight:      Height:        Intake/Output Summary (Last 24 hours) at 03/15/2020 1637 Last data filed at 03/15/2020 1335 Gross per 24 hour  Intake 240 ml  Output 1200 ml  Net -960 ml   Filed Weights   03/11/20 1549  Weight: 59 kg    Examination: General exam: Awake, laying in bed, in nad Respiratory system: Normal respiratory effort, no wheezing Cardiovascular system: regular rate, s1, s2 Gastrointestinal system: Soft, nondistended, positive BS Central nervous system: CN2-12 grossly intact, strength intact Extremities: Perfused, no clubbing Skin: Normal skin turgor, no notable skin lesions seen Psychiatry: Mood normal // no visual hallucinations   Data Reviewed: I have personally reviewed following labs and imaging studies  CBC: Recent Labs  Lab 03/11/20 1731 03/12/20 0916 03/13/20 0538 03/14/20 0848 03/15/20 0545  WBC 13.4* 7.4 10.6* 15.7* 14.9*  NEUTROABS 11.1*  --  9.4* 14.4* 13.3*  HGB 7.5* 8.0* 7.2* 8.1* 8.3*  HCT 25.0* 27.7* 24.2* 27.3* 27.4*  MCV 85.3 89.1 88.0 87.2 85.9  PLT 208 203 234 249 749   Basic Metabolic Panel: Recent Labs  Lab 03/11/20 1731 03/12/20 0916 03/13/20 0538  03/14/20 0848 03/15/20 0545  NA 133* 134* 135 133* 134*  K 3.5 4.6 4.4 3.7 4.5  CL 95* 98 99 96* 93*  CO2 25 23 26 25 26   GLUCOSE 103* 222* 132* 167* 107*  BUN 17 17 13 14 17   CREATININE 0.40* 0.40* 0.41* 0.42* 0.38*  CALCIUM 9.9 9.6 9.7 9.8 10.3  MG  --   --  2.0 1.9 2.0  PHOS  --   --  2.3* 2.6 3.3   GFR: Estimated Creatinine Clearance: 81.9 mL/min (A) (by C-G formula based on SCr of 0.38 mg/dL (L)). Liver Function Tests: Recent Labs  Lab 03/11/20 1731  AST 23  ALT 17  ALKPHOS 322*  BILITOT 0.8  PROT 8.0  ALBUMIN 2.9*   No results for input(s): LIPASE, AMYLASE in the last 168 hours. No results for input(s): AMMONIA in the last 168 hours. Coagulation Profile: No results for input(s): INR, PROTIME in the last 168 hours. Cardiac Enzymes: No results for input(s): CKTOTAL, CKMB, CKMBINDEX, TROPONINI in the last 168 hours. BNP (last 3 results) No results for input(s): PROBNP in the last 8760 hours. HbA1C: No results for input(s): HGBA1C in the last 72 hours. CBG: No results for input(s): GLUCAP in the last 168 hours. Lipid Profile: No results for input(s): CHOL, HDL, LDLCALC, TRIG, CHOLHDL, LDLDIRECT in the last 72 hours. Thyroid Function Tests: No results for input(s): TSH, T4TOTAL, FREET4, T3FREE, THYROIDAB in the last 72 hours. Anemia Panel: No results for input(s): VITAMINB12, FOLATE, FERRITIN, TIBC, IRON, RETICCTPCT in the last 72 hours. Sepsis Labs: No results for input(s): PROCALCITON, LATICACIDVEN in the last 168 hours.  Recent Results (from the past 240 hour(s))  Respiratory Panel by RT PCR (Flu A&B, Covid) - Nasopharyngeal Swab     Status: None   Collection Time: 03/11/20  3:59 PM   Specimen: Nasopharyngeal Swab  Result Value Ref Range Status   SARS Coronavirus 2 by RT PCR NEGATIVE NEGATIVE Final    Comment: (NOTE) SARS-CoV-2 target nucleic acids are NOT DETECTED.  The SARS-CoV-2 RNA is generally detectable in upper respiratoy specimens during the acute  phase of infection. The lowest concentration of SARS-CoV-2 viral copies this assay can detect is 131 copies/mL. A negative result does not preclude SARS-Cov-2 infection and should not be used as the sole basis for treatment or other Brandon Ferguson management decisions. A negative result may occur with  improper specimen collection/handling, submission of specimen other than nasopharyngeal swab, presence of viral mutation(s) within the areas targeted by this assay, and inadequate number of viral copies (<131 copies/mL). A negative result must be combined with clinical observations, Brandon Ferguson history, and epidemiological information. The expected result is Negative.  Fact Sheet for Patients:  PinkCheek.be  Fact Sheet for Healthcare Providers:  GravelBags.it  This test is no t yet approved or cleared by the [place] FDA and  has been authorized for detection and/or diagnosis of SARS-CoV-2 by FDA under an Emergency Use Authorization (EUA). This EUA will remain  in effect (meaning this test can be used) for the duration of the  COVID-19 declaration under Section 564(b)(1) of the Act, 21 U.S.C. section 360bbb-3(b)(1), unless the authorization is terminated or revoked sooner.     Influenza A by PCR NEGATIVE NEGATIVE Final   Influenza B by PCR NEGATIVE NEGATIVE Final    Comment: (NOTE) The Xpert Xpress SARS-CoV-2/FLU/RSV assay is intended as an aid in  the diagnosis of influenza from Nasopharyngeal swab specimens and  should not be used as a sole basis for treatment. Nasal washings and  aspirates are unacceptable for Xpert Xpress SARS-CoV-2/FLU/RSV  testing.  Fact Sheet for Patients: PinkCheek.be  Fact Sheet for Healthcare Providers: GravelBags.it  This test is not yet approved or cleared by the [place] FDA and  has been authorized for detection and/or diagnosis of  SARS-CoV-2 by  FDA under an Emergency Use Authorization (EUA). This EUA will remain  in effect (meaning this test can be used) for the duration of the  Covid-19 declaration under Section 564(b)(1) of the Act, 21  U.S.C. section 360bbb-3(b)(1), unless the authorization is  terminated or revoked. Performed at [facility], [address], [place], [place] [address]      Radiology Studies: No results found.  Scheduled Meds: . Chlorhexidine Gluconate Cloth  6 each Topical Daily  . dexamethasone  4 mg Oral TID  . feeding supplement (ENSURE ENLIVE)  237 mL Oral BID BM  . heparin  5,000 Units Subcutaneous Q8H  . levETIRAcetam  500 mg Oral BID  . senna-docusate  2 tablet Oral QHS   Continuous Infusions:   LOS: 4 days   Marylu Lund, MD [facility] Pager On Amion  If 7PM-7AM, please contact night-coverage 03/15/2020, 4:37 PM

## 2020-03-16 ENCOUNTER — Ambulatory Visit
Admission: RE | Admit: 2020-03-16 | Discharge: 2020-03-16 | Disposition: A | Discharge status: Home / Self Care | Payer: Medicaid Other | Source: Ambulatory Visit | Attending: Radiation Oncology | Admitting: Radiation Oncology

## 2020-03-16 DIAGNOSIS — K5909 Other constipation: Secondary | ICD-10-CM | POA: Diagnosis not present

## 2020-03-16 DIAGNOSIS — D649 Anemia, unspecified: Secondary | ICD-10-CM | POA: Diagnosis not present

## 2020-03-16 DIAGNOSIS — C7951 Secondary malignant neoplasm of bone: Secondary | ICD-10-CM | POA: Diagnosis not present

## 2020-03-16 DIAGNOSIS — C3411 Malignant neoplasm of upper lobe, right bronchus or lung: Secondary | ICD-10-CM | POA: Diagnosis not present

## 2020-03-16 DIAGNOSIS — G834 Cauda equina syndrome: Secondary | ICD-10-CM | POA: Diagnosis not present

## 2020-03-16 DIAGNOSIS — Z66 Do not resuscitate: Secondary | ICD-10-CM

## 2020-03-16 LAB — BASIC METABOLIC PANEL
Anion gap: 12 (ref 5–15)
BUN: 20 mg/dL (ref 6–20)
CO2: 26 mmol/L (ref 22–32)
Calcium: 10.2 mg/dL (ref 8.9–10.3)
Chloride: 93 mmol/L — ABNORMAL LOW (ref 98–111)
Creatinine, Ser: 0.39 mg/dL — ABNORMAL LOW (ref 0.61–1.24)
GFR calc non Af Amer: >60 mL/min (ref >60)
Glucose, Bld: 118 mg/dL — ABNORMAL HIGH (ref 70–99)
Potassium: 4.5 mmol/L (ref 3.5–5.1)
Sodium: 131 mmol/L — ABNORMAL LOW (ref 135–145)

## 2020-03-16 LAB — CBC WITH DIFFERENTIAL/PLATELET
Abs Immature Granulocytes: 0.15 10*3/uL — ABNORMAL HIGH (ref 0.00–0.07)
Basophils Absolute: 0 10*3/uL (ref 0.0–0.1)
Basophils Relative: 0 %
Eosinophils Absolute: 0 10*3/uL (ref 0.0–0.5)
Eosinophils Relative: 0 %
HCT: 26 % — ABNORMAL LOW (ref 39.0–52.0)
Hemoglobin: 7.9 g/dL — ABNORMAL LOW (ref 13.0–17.0)
Immature Granulocytes: 1 %
Lymphocytes Relative: 3 %
Lymphs Abs: 0.3 10*3/uL — ABNORMAL LOW (ref 0.7–4.0)
MCH: 26.2 pg (ref 26.0–34.0)
MCHC: 30.4 g/dL (ref 30.0–36.0)
MCV: 86.1 fL (ref 80.0–100.0)
Monocytes Absolute: 0.8 10*3/uL (ref 0.1–1.0)
Monocytes Relative: 6 %
Neutro Abs: 11.2 10*3/uL — ABNORMAL HIGH (ref 1.7–7.7)
Neutrophils Relative %: 90 %
Platelets: 236 10*3/uL (ref 150–400)
RBC: 3.02 MIL/uL — ABNORMAL LOW (ref 4.22–5.81)
RDW: 20.6 % — ABNORMAL HIGH (ref 11.5–15.5)
WBC: 12.4 10*3/uL — ABNORMAL HIGH (ref 4.0–10.5)
nRBC: 0.2 % (ref 0.0–0.2)

## 2020-03-16 LAB — MAGNESIUM: Magnesium: 2 mg/dL (ref 1.7–2.4)

## 2020-03-16 LAB — PHOSPHORUS: Phosphorus: 2.9 mg/dL (ref 2.5–4.6)

## 2020-03-16 MED ORDER — HYDROCODONE-ACETAMINOPHEN 5-325 MG PO TABS: 2.0000 | ORAL_TABLET | Freq: Four times a day (QID) | ORAL | Status: DC | PRN

## 2020-03-16 NOTE — TOC Progression Note (Addendum)
Transition of Care [facility]) - Progression Note    Patient Details  Name: Jerrold Haskell MRN: [id number] Date of Birth: Jan 26, 1961  Transition of Care [facility]) CM/SW Contact  Lennart Pall, LCSW Phone Number: 03/16/2020, 11:54 AM  Clinical Narrative:    Met with pt and sisters to review decisions made with Dr. Hilma Favors and confirming desire to proceed with SNF placement.  Have also spoken with APS CM, Patriciaann Clan, who needs to clarify status of pt's Medicaid and she is hoping to get this done today.  Have resent pt's info out to [facility] and left VM for admissions liaison.  Will keep team posted on progress with SNF bed placement.  1600 addendum:   Have sent patient's information out to slightly wider search area as [facility] yet confirm if they can offer a bed to pt. One barrier is that pt has not received COVID vaccine which limits bed availability.  Have been able to confirm with Medicaid that pt does have active coverage for SNF placement.  Sisters have been updated.  TOC to continue to follow.      Expected Discharge Plan and Services                                                 Social Determinants of Health (SDOH) Interventions    Readmission Risk Interventions No flowsheet data found.

## 2020-03-16 NOTE — Progress Notes (Signed)
Chaplain responded to spiritual consult. Patient was listening to TV with eyes closed when chaplain entered room.  When she explained why she was there, patient looked vaguely confused as to why chaplain was there. When chaplain inquired if patient desired visit, patient shrugged and said, "it don't matter to me."   Chaplain then asked if she could just offer a brief prayer instead.  Patient nodded affirmatively after prayer. Chaplain is available if patient desires another visit. Rev.Tamsen Snider Pager [phone]

## 2020-03-16 NOTE — Progress Notes (Addendum)
PROGRESS NOTE    Ronte Parker  MHD:[id number] DOB: [birth year] DOA: 03/11/2020 PCP: Patient, No Pcp Per    Brief Narrative:  59 y.o.malewith medical history significant formetastatic lung cancer to brain and spine. Patient presented to the ED with complaints of not been able to walkthat has progressed over the past month.   Assessment & Plan:   Principal Problem:   Malignant neoplasm of right upper lobe of lung (HCC) Active Problems:   Metastatic cancer to brain ([place])   Bone metastasis ([place])   Paraplegia ([place])   Constipation   DNR (do not resuscitate)  * Malignant neoplasm of right upper lobe of lung (HCC) -Markedly decreased strength throughout with urinary incontinence and MRI spine findings worrisome for metastatic spread of disease. -Appreciate input and assistance by Palliative Care, who had discussed with pt's Oncologist, Dr. Tommi Rumps -No plans for further chemo. Radiation tx to stop once pt discharges -Pt now DNR -Prognosis is <3 months -Plan for SNF placement. TOC following -Continue analgesia as needed  Constipation - Pt on chronic opioids - now with bowel movements with cathartics, will continue  Paraplegia (HCC) - likely secondary to above metastatic disease  Bone metastasis (HCC) - secondary to above neoplasm  Metastatic cancer to brain [facility]) - follows with [name].  -See above. Palliative Care following. Plans for no further chemo and plan to end radiation tx after discharge to SNF.  DVT prophylaxis: Heparin subq Code Status: Full Family Communication: Pt in room, family not at bedside  Status is: Inpatient  Remains inpatient appropriate because:Unsafe d/c plan and Inpatient level of care appropriate due to severity of illness   Dispo: The patient is from: Home              Anticipated d/c is to: SNF              Anticipated d/c date is: 1 day              Patient currently is not medically stable to d/c.   Consultants:  Rad  onc Palliative Care  Procedures:     Antimicrobials: Anti-infectives (From admission, onward)   None      Subjective: Denies abd pain or chest pain  Objective: Vitals:   03/15/20 1329 03/15/20 1947 03/16/20 0541 03/16/20 1331  BP: 133/89 118/87 103/75 118/78  Pulse: 94 92 83 86  Resp: 16 16 16 17   Temp: 98.4 F (36.9 C) 97.9 F (36.6 C) 97.9 F (36.6 C)   TempSrc:  Oral    SpO2: 100% 98% 100% 100%  Weight:      Height:        Intake/Output Summary (Last 24 hours) at 03/16/2020 1628 Last data filed at 03/16/2020 0541 Gross per 24 hour  Intake 240 ml  Output 800 ml  Net -560 ml   Filed Weights   03/11/20 1549  Weight: 59 kg    Examination: General exam: Conversant, in no acute distress Respiratory system: normal chest rise, clear, no audible wheezing Cardiovascular system: regular rhythm, s1-s2 Gastrointestinal system: Nondistended, nontender, pos BS Central nervous system: No seizures, no tremors Extremities: No cyanosis, no joint deformities Skin: No rashes, no pallor Psychiatry: Affect normal // no auditory hallucinations   Data Reviewed: I have personally reviewed following labs and imaging studies  CBC: Recent Labs  Lab 03/11/20 1731 03/11/20 1731 03/12/20 0916 03/13/20 0538 03/14/20 0848 03/15/20 0545 03/16/20 0518  WBC 13.4*   < > 7.4 10.6* 15.7* 14.9* 12.4*  NEUTROABS 11.1*  --   --  9.4* 14.4* 13.3* 11.2*  HGB 7.5*   < > 8.0* 7.2* 8.1* 8.3* 7.9*  HCT 25.0*   < > 27.7* 24.2* 27.3* 27.4* 26.0*  MCV 85.3   < > 89.1 88.0 87.2 85.9 86.1  PLT 208   < > 203 234 249 279 236   < > = values in this interval not displayed.   Basic Metabolic Panel: Recent Labs  Lab 03/12/20 0916 03/13/20 0538 03/14/20 0848 03/15/20 0545 03/16/20 0518  NA 134* 135 133* 134* 131*  K 4.6 4.4 3.7 4.5 4.5  CL 98 99 96* 93* 93*  CO2 23 26 25 26 26   GLUCOSE 222* 132* 167* 107* 118*  BUN 17 13 14 17 20   CREATININE 0.40* 0.41* 0.42* 0.38* 0.39*  CALCIUM 9.6 9.7  9.8 10.3 10.2  MG  --  2.0 1.9 2.0 2.0  PHOS  --  2.3* 2.6 3.3 2.9   GFR: Estimated Creatinine Clearance: 81.9 mL/min (A) (by C-G formula based on SCr of 0.39 mg/dL (L)). Liver Function Tests: Recent Labs  Lab 03/11/20 1731  AST 23  ALT 17  ALKPHOS 322*  BILITOT 0.8  PROT 8.0  ALBUMIN 2.9*   No results for input(s): LIPASE, AMYLASE in the last 168 hours. No results for input(s): AMMONIA in the last 168 hours. Coagulation Profile: No results for input(s): INR, PROTIME in the last 168 hours. Cardiac Enzymes: No results for input(s): CKTOTAL, CKMB, CKMBINDEX, TROPONINI in the last 168 hours. BNP (last 3 results) No results for input(s): PROBNP in the last 8760 hours. HbA1C: No results for input(s): HGBA1C in the last 72 hours. CBG: No results for input(s): GLUCAP in the last 168 hours. Lipid Profile: No results for input(s): CHOL, HDL, LDLCALC, TRIG, CHOLHDL, LDLDIRECT in the last 72 hours. Thyroid Function Tests: No results for input(s): TSH, T4TOTAL, FREET4, T3FREE, THYROIDAB in the last 72 hours. Anemia Panel: No results for input(s): VITAMINB12, FOLATE, FERRITIN, TIBC, IRON, RETICCTPCT in the last 72 hours. Sepsis Labs: No results for input(s): PROCALCITON, LATICACIDVEN in the last 168 hours.  Recent Results (from the past 240 hour(s))  Respiratory Panel by RT PCR (Flu A&B, Covid) - Nasopharyngeal Swab     Status: None   Collection Time: 03/11/20  3:59 PM   Specimen: Nasopharyngeal Swab  Result Value Ref Range Status   SARS Coronavirus 2 by RT PCR NEGATIVE NEGATIVE Final    Comment: (NOTE) SARS-CoV-2 target nucleic acids are NOT DETECTED.  The SARS-CoV-2 RNA is generally detectable in upper respiratoy specimens during the acute phase of infection. The lowest concentration of SARS-CoV-2 viral copies this assay can detect is 131 copies/mL. A negative result does not preclude SARS-Cov-2 infection and should not be used as the sole basis for treatment or other patient  management decisions. A negative result may occur with  improper specimen collection/handling, submission of specimen other than nasopharyngeal swab, presence of viral mutation(s) within the areas targeted by this assay, and inadequate number of viral copies (<131 copies/mL). A negative result must be combined with clinical observations, patient history, and epidemiological information. The expected result is Negative.  Fact Sheet for Patients:  PinkCheek.be  Fact Sheet for Healthcare Providers:  GravelBags.it  This test is no t yet approved or cleared by the [place] FDA and  has been authorized for detection and/or diagnosis of SARS-CoV-2 by FDA under an Emergency Use Authorization (EUA). This EUA will remain  in effect (meaning this test can be used) for the duration of the  COVID-19 declaration under Section 564(b)(1) of the Act, 21 U.S.C. section 360bbb-3(b)(1), unless the authorization is terminated or revoked sooner.     Influenza A by PCR NEGATIVE NEGATIVE Final   Influenza B by PCR NEGATIVE NEGATIVE Final    Comment: (NOTE) The Xpert Xpress SARS-CoV-2/FLU/RSV assay is intended as an aid in  the diagnosis of influenza from Nasopharyngeal swab specimens and  should not be used as a sole basis for treatment. Nasal washings and  aspirates are unacceptable for Xpert Xpress SARS-CoV-2/FLU/RSV  testing.  Fact Sheet for Patients: PinkCheek.be  Fact Sheet for Healthcare Providers: GravelBags.it  This test is not yet approved or cleared by the [place] FDA and  has been authorized for detection and/or diagnosis of SARS-CoV-2 by  FDA under an Emergency Use Authorization (EUA). This EUA will remain  in effect (meaning this test can be used) for the duration of the  Covid-19 declaration under Section 564(b)(1) of the Act, 21  U.S.C. section 360bbb-3(b)(1),  unless the authorization is  terminated or revoked. Performed at [facility], [address] [place], [place] [address]      Radiology Studies: No results found.  Scheduled Meds: . Chlorhexidine Gluconate Cloth  6 each Topical Daily  . dexamethasone  4 mg Oral TID  . feeding supplement (ENSURE ENLIVE)  237 mL Oral BID BM  . heparin  5,000 Units Subcutaneous Q8H  . levETIRAcetam  500 mg Oral BID  . senna-docusate  2 tablet Oral QHS   Continuous Infusions:   LOS: 5 days   Marylu Lund, MD [facility] Pager On Amion  If 7PM-7AM, please contact night-coverage 03/16/2020, 4:28 PM

## 2020-03-16 NOTE — Progress Notes (Signed)
PT Cancellation Note  Patient Details Name: Brandon Ferguson MRN: [id number] DOB: 04-10-1961   Cancelled Treatment:     pt out of room for procedure.  Pt has been evaluated and rec for SNF.   Rica Koyanagi  PTA Acute  Rehabilitation Services Pager      [phone] Office      [phone]

## 2020-03-16 NOTE — Progress Notes (Signed)
Palliative Care Progress Note  I met with Mr. Oehlert and both of his sisters today to discuss goals of care. I shared prognostic information as it related to his cancer and after communication with oncology. 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 His pain is mostly controlled but he has required PRN doses of opioids and he reports not sleeping well. His appetite is good on the steroids.    Prognosis likely less than 3 months.  We discussed code status in detail and I explained hospice services.   Recommendations: 1. DNR 2. They are agreeable to hospice services 3. They would agree to [facility]- it is a good location. 4. No additional chemo, radiation will stop when he discharges from the hospital. 5. Focus on pain management.  Lane Hacker, DO Palliative Medicine   35 min Greater than 50%  of this time was spent counseling and coordinating care related to the above assessment and plan.

## 2020-03-17 DIAGNOSIS — C3411 Malignant neoplasm of upper lobe, right bronchus or lung: Secondary | ICD-10-CM | POA: Diagnosis not present

## 2020-03-17 DIAGNOSIS — D649 Anemia, unspecified: Secondary | ICD-10-CM | POA: Diagnosis not present

## 2020-03-17 DIAGNOSIS — G834 Cauda equina syndrome: Secondary | ICD-10-CM | POA: Diagnosis not present

## 2020-03-17 DIAGNOSIS — K5909 Other constipation: Secondary | ICD-10-CM | POA: Diagnosis not present

## 2020-03-17 DIAGNOSIS — C7951 Secondary malignant neoplasm of bone: Secondary | ICD-10-CM | POA: Diagnosis not present

## 2020-03-17 LAB — BASIC METABOLIC PANEL
Anion gap: 12 (ref 5–15)
BUN: 22 mg/dL — ABNORMAL HIGH (ref 6–20)
CO2: 28 mmol/L (ref 22–32)
Calcium: 10.4 mg/dL — ABNORMAL HIGH (ref 8.9–10.3)
Chloride: 94 mmol/L — ABNORMAL LOW (ref 98–111)
Creatinine, Ser: 0.3 mg/dL — ABNORMAL LOW (ref 0.61–1.24)
GFR, Estimated: >60 mL/min (ref >60)
Glucose, Bld: 103 mg/dL — ABNORMAL HIGH (ref 70–99)
Potassium: 4.3 mmol/L (ref 3.5–5.1)
Sodium: 134 mmol/L — ABNORMAL LOW (ref 135–145)

## 2020-03-17 LAB — CBC WITH DIFFERENTIAL/PLATELET
Abs Immature Granulocytes: 0.09 10*3/uL — ABNORMAL HIGH (ref 0.00–0.07)
Basophils Absolute: 0 10*3/uL (ref 0.0–0.1)
Basophils Relative: 0 %
Eosinophils Absolute: 0 10*3/uL (ref 0.0–0.5)
Eosinophils Relative: 0 %
HCT: 26 % — ABNORMAL LOW (ref 39.0–52.0)
Hemoglobin: 7.9 g/dL — ABNORMAL LOW (ref 13.0–17.0)
Immature Granulocytes: 1 %
Lymphocytes Relative: 3 %
Lymphs Abs: 0.4 10*3/uL — ABNORMAL LOW (ref 0.7–4.0)
MCH: 26.3 pg (ref 26.0–34.0)
MCHC: 30.4 g/dL (ref 30.0–36.0)
MCV: 86.7 fL (ref 80.0–100.0)
Monocytes Absolute: 0.6 10*3/uL (ref 0.1–1.0)
Monocytes Relative: 5 %
Neutro Abs: 10.2 10*3/uL — ABNORMAL HIGH (ref 1.7–7.7)
Neutrophils Relative %: 91 %
Platelets: 276 10*3/uL (ref 150–400)
RBC: 3 MIL/uL — ABNORMAL LOW (ref 4.22–5.81)
RDW: 20.5 % — ABNORMAL HIGH (ref 11.5–15.5)
WBC: 11.2 10*3/uL — ABNORMAL HIGH (ref 4.0–10.5)
nRBC: 0 % (ref 0.0–0.2)

## 2020-03-17 LAB — MAGNESIUM: Magnesium: 1.9 mg/dL (ref 1.7–2.4)

## 2020-03-17 LAB — PHOSPHORUS: Phosphorus: 3.1 mg/dL (ref 2.5–4.6)

## 2020-03-17 NOTE — Progress Notes (Signed)
PROGRESS NOTE    Brandon Ferguson  QAS:[id number] DOB: 06-13-1960 DOA: 03/11/2020 PCP: Patient, No Pcp Per    Brief Narrative:  59 y.o.malewith medical history significant formetastatic lung cancer to brain and spine. Patient presented to the ED with complaints of not been able to walkthat has progressed over the past month.   Assessment & Plan:   Principal Problem:   Malignant neoplasm of right upper lobe of lung (HCC) Active Problems:   Metastatic cancer to brain ([place])   Bone metastasis ([place])   Paraplegia ([place])   Constipation   DNR (do not resuscitate)  * Malignant neoplasm of right upper lobe of lung (HCC) -Markedly decreased strength throughout with urinary incontinence and MRI spine findings worrisome for metastatic spread of disease. -Appreciate input and assistance by Palliative Care, who had discussed with pt's Oncologist, Dr. Tommi Rumps -No plans for further chemo. Radiation tx to stop once pt discharges -Pt's wishes now DNR -Prognosis is <3 months -Plan for SNF placement. TOC following for placement -Continue analgesia as needed  Constipation - Pt on chronic opioids - now with bowel movements with cathartics, will continue  Paraplegia (HCC) - likely secondary to above metastatic disease  Bone metastasis (HCC) - secondary to above neoplasm  Metastatic cancer to brain [facility]) - follows with [name].  -See above. Palliative Care following. Plans for no further chemo and plan to end radiation tx after discharge to SNF.  DVT prophylaxis: Heparin subq Code Status: Full Family Communication: Pt in room, family not at bedside  Status is: Inpatient  Remains inpatient appropriate because:Unsafe d/c plan   Dispo: The patient is from: Home              Anticipated d/c is to: SNF              Anticipated d/c date is: 1 day              Patient currently is medically stable to d/c. Just waiting placement   Consultants:  Rad onc Palliative  Care  Procedures:     Antimicrobials: Anti-infectives (From admission, onward)   None      Subjective: Without complaints this AM  Objective: Vitals:   03/16/20 1331 03/16/20 2154 03/17/20 0548 03/17/20 1354  BP: 118/78 103/73 101/77 112/82  Pulse: 86 92 81 92  Resp: 17 16 16 15   Temp:  98.1 F (36.7 C) 97.9 F (36.6 C) 98.7 F (37.1 C)  TempSrc:  Oral Oral Oral  SpO2: 100% 100% 100% 99%  Weight:      Height:        Intake/Output Summary (Last 24 hours) at 03/17/2020 1612 Last data filed at 03/17/2020 0800 Gross per 24 hour  Intake 240 ml  Output 1000 ml  Net -760 ml   Filed Weights   03/11/20 1549  Weight: 59 kg    Examination: General exam: Awake, laying in bed, in nad Respiratory system: Normal respiratory effort, no wheezing  Data Reviewed: I have personally reviewed following labs and imaging studies  CBC: Recent Labs  Lab 03/13/20 0538 03/14/20 0848 03/15/20 0545 03/16/20 0518 03/17/20 0618  WBC 10.6* 15.7* 14.9* 12.4* 11.2*  NEUTROABS 9.4* 14.4* 13.3* 11.2* 10.2*  HGB 7.2* 8.1* 8.3* 7.9* 7.9*  HCT 24.2* 27.3* 27.4* 26.0* 26.0*  MCV 88.0 87.2 85.9 86.1 86.7  PLT 234 249 279 236 798   Basic Metabolic Panel: Recent Labs  Lab 03/13/20 0538 03/14/20 0848 03/15/20 0545 03/16/20 0518 03/17/20 0618  NA 135 133*  134* 131* 134*  K 4.4 3.7 4.5 4.5 4.3  CL 99 96* 93* 93* 94*  CO2 26 25 26 26 28   GLUCOSE 132* 167* 107* 118* 103*  BUN 13 14 17 20  22*  CREATININE 0.41* 0.42* 0.38* 0.39* 0.30*  CALCIUM 9.7 9.8 10.3 10.2 10.4*  MG 2.0 1.9 2.0 2.0 1.9  PHOS 2.3* 2.6 3.3 2.9 3.1   GFR: Estimated Creatinine Clearance: 81.9 mL/min (A) (by C-G formula based on SCr of 0.3 mg/dL (L)). Liver Function Tests: Recent Labs  Lab 03/11/20 1731  AST 23  ALT 17  ALKPHOS 322*  BILITOT 0.8  PROT 8.0  ALBUMIN 2.9*   No results for input(s): LIPASE, AMYLASE in the last 168 hours. No results for input(s): AMMONIA in the last 168 hours. Coagulation  Profile: No results for input(s): INR, PROTIME in the last 168 hours. Cardiac Enzymes: No results for input(s): CKTOTAL, CKMB, CKMBINDEX, TROPONINI in the last 168 hours. BNP (last 3 results) No results for input(s): PROBNP in the last 8760 hours. HbA1C: No results for input(s): HGBA1C in the last 72 hours. CBG: No results for input(s): GLUCAP in the last 168 hours. Lipid Profile: No results for input(s): CHOL, HDL, LDLCALC, TRIG, CHOLHDL, LDLDIRECT in the last 72 hours. Thyroid Function Tests: No results for input(s): TSH, T4TOTAL, FREET4, T3FREE, THYROIDAB in the last 72 hours. Anemia Panel: No results for input(s): VITAMINB12, FOLATE, FERRITIN, TIBC, IRON, RETICCTPCT in the last 72 hours. Sepsis Labs: No results for input(s): PROCALCITON, LATICACIDVEN in the last 168 hours.  Recent Results (from the past 240 hour(s))  Respiratory Panel by RT PCR (Flu A&B, Covid) - Nasopharyngeal Swab     Status: None   Collection Time: 03/11/20  3:59 PM   Specimen: Nasopharyngeal Swab  Result Value Ref Range Status   SARS Coronavirus 2 by RT PCR NEGATIVE NEGATIVE Final    Comment: (NOTE) SARS-CoV-2 target nucleic acids are NOT DETECTED.  The SARS-CoV-2 RNA is generally detectable in upper respiratoy specimens during the acute phase of infection. The lowest concentration of SARS-CoV-2 viral copies this assay can detect is 131 copies/mL. A negative result does not preclude SARS-Cov-2 infection and should not be used as the sole basis for treatment or other patient management decisions. A negative result may occur with  improper specimen collection/handling, submission of specimen other than nasopharyngeal swab, presence of viral mutation(s) within the areas targeted by this assay, and inadequate number of viral copies (<131 copies/mL). A negative result must be combined with clinical observations, patient history, and epidemiological information. The expected result is Negative.  Fact Sheet  for Patients:  PinkCheek.be  Fact Sheet for Healthcare Providers:  GravelBags.it  This test is no t yet approved or cleared by the [place] FDA and  has been authorized for detection and/or diagnosis of SARS-CoV-2 by FDA under an Emergency Use Authorization (EUA). This EUA will remain  in effect (meaning this test can be used) for the duration of the COVID-19 declaration under Section 564(b)(1) of the Act, 21 U.S.C. section 360bbb-3(b)(1), unless the authorization is terminated or revoked sooner.     Influenza A by PCR NEGATIVE NEGATIVE Final   Influenza B by PCR NEGATIVE NEGATIVE Final    Comment: (NOTE) The Xpert Xpress SARS-CoV-2/FLU/RSV assay is intended as an aid in  the diagnosis of influenza from Nasopharyngeal swab specimens and  should not be used as a sole basis for treatment. Nasal washings and  aspirates are unacceptable for Xpert Xpress SARS-CoV-2/FLU/RSV  testing.  Fact Sheet for Patients: PinkCheek.be  Fact Sheet for Healthcare Providers: GravelBags.it  This test is not yet approved or cleared by the [place] FDA and  has been authorized for detection and/or diagnosis of SARS-CoV-2 by  FDA under an Emergency Use Authorization (EUA). This EUA will remain  in effect (meaning this test can be used) for the duration of the  Covid-19 declaration under Section 564(b)(1) of the Act, 21  U.S.C. section 360bbb-3(b)(1), unless the authorization is  terminated or revoked. Performed at [facility], [address], [place], [place] [address]      Radiology Studies: No results found.  Scheduled Meds: . Chlorhexidine Gluconate Cloth  6 each Topical Daily  . dexamethasone  4 mg Oral TID  . feeding supplement (ENSURE ENLIVE)  237 mL Oral BID BM  . heparin  5,000 Units Subcutaneous Q8H  . levETIRAcetam  500 mg Oral BID  . senna-docusate  2 tablet  Oral QHS   Continuous Infusions:   LOS: 6 days   Marylu Lund, MD [facility] Pager On Amion  If 7PM-7AM, please contact night-coverage 03/17/2020, 4:12 PM

## 2020-03-18 DIAGNOSIS — C3411 Malignant neoplasm of upper lobe, right bronchus or lung: Secondary | ICD-10-CM | POA: Diagnosis not present

## 2020-03-18 DIAGNOSIS — K5909 Other constipation: Secondary | ICD-10-CM | POA: Diagnosis not present

## 2020-03-18 DIAGNOSIS — C7951 Secondary malignant neoplasm of bone: Secondary | ICD-10-CM | POA: Diagnosis not present

## 2020-03-18 DIAGNOSIS — D649 Anemia, unspecified: Secondary | ICD-10-CM | POA: Diagnosis not present

## 2020-03-18 DIAGNOSIS — G834 Cauda equina syndrome: Secondary | ICD-10-CM | POA: Diagnosis not present

## 2020-03-18 NOTE — Progress Notes (Signed)
PROGRESS NOTE    Brandon Ferguson  VFI:[id number] DOB: 05-20-1961 DOA: 03/11/2020 PCP: Patient, No Pcp Per    Brief Narrative:  59 y.o.malewith medical history significant formetastatic lung cancer to brain and spine. Patient presented to the ED with complaints of not been able to walkthat has progressed over the past month.   Assessment & Plan:   Principal Problem:   Malignant neoplasm of right upper lobe of lung (HCC) Active Problems:   Metastatic cancer to brain ([place])   Bone metastasis ([place])   Paraplegia ([place])   Constipation   DNR (do not resuscitate)  * Malignant neoplasm of right upper lobe of lung (HCC) -Markedly decreased strength throughout with urinary incontinence and MRI spine findings worrisome for metastatic spread of disease. -Appreciate input and assistance by Palliative Care, who had discussed with pt's Oncologist, Dr. Tommi Rumps -No plans for further chemo. Radiation tx to stop once pt discharges -Pt's wishes now DNR -Prognosis is <3 months -Plan for SNF placement. Follow with TOC regarding placement -Continue analgesia as needed  Constipation - Pt on chronic opioids - now with bowel movements with cathartics, will continue  Paraplegia (HCC) - likely secondary to above metastatic disease  Bone metastasis ([place]) - secondary to above neoplasm  Metastatic cancer to brain [facility]) - follows with [name].  -See above. Palliative Care following. Plans for no further chemo and plan to end radiation tx after discharge to SNF.  DVT prophylaxis: Heparin subq Code Status: Full Family Communication: Pt in room, family not at bedside  Status is: Inpatient  Remains inpatient appropriate because:Unsafe d/c plan   Dispo: The patient is from: Home              Anticipated d/c is to: SNF              Anticipated d/c date is: 1 day              Patient currently is medically stable to d/c. Just waiting placement   Consultants:  Rad onc Palliative  Care  Procedures:     Antimicrobials: Anti-infectives (From admission, onward)   None      Subjective: Asking for additional pillow  Objective: Vitals:   03/17/20 1354 03/17/20 1947 03/18/20 0614 03/18/20 1324  BP: 112/82 120/77 121/84 124/81  Pulse: 92 93 81 82  Resp: 15 14 14 15   Temp: 98.7 F (37.1 C) 98.2 F (36.8 C) 97.8 F (36.6 C) 98 F (36.7 C)  TempSrc: Oral Oral Oral Oral  SpO2: 99% 98% 99% 99%  Weight:      Height:        Intake/Output Summary (Last 24 hours) at 03/18/2020 1523 Last data filed at 03/18/2020 1227 Gross per 24 hour  Intake 474 ml  Output 600 ml  Net -126 ml   Filed Weights   03/11/20 1549  Weight: 59 kg    Examination: General exam: Conversant, in no acute distress Respiratory system: normal chest rise, clear, no audible wheezing  Data Reviewed: I have personally reviewed following labs and imaging studies  CBC: Recent Labs  Lab 03/13/20 0538 03/14/20 0848 03/15/20 0545 03/16/20 0518 03/17/20 0618  WBC 10.6* 15.7* 14.9* 12.4* 11.2*  NEUTROABS 9.4* 14.4* 13.3* 11.2* 10.2*  HGB 7.2* 8.1* 8.3* 7.9* 7.9*  HCT 24.2* 27.3* 27.4* 26.0* 26.0*  MCV 88.0 87.2 85.9 86.1 86.7  PLT 234 249 279 236 416   Basic Metabolic Panel: Recent Labs  Lab 03/13/20 0538 03/14/20 0848 03/15/20 0545 03/16/20 0518 03/17/20  0618  NA 135 133* 134* 131* 134*  K 4.4 3.7 4.5 4.5 4.3  CL 99 96* 93* 93* 94*  CO2 26 25 26 26 28   GLUCOSE 132* 167* 107* 118* 103*  BUN 13 14 17 20  22*  CREATININE 0.41* 0.42* 0.38* 0.39* 0.30*  CALCIUM 9.7 9.8 10.3 10.2 10.4*  MG 2.0 1.9 2.0 2.0 1.9  PHOS 2.3* 2.6 3.3 2.9 3.1   GFR: Estimated Creatinine Clearance: 81.9 mL/min (A) (by C-G formula based on SCr of 0.3 mg/dL (L)). Liver Function Tests: Recent Labs  Lab 03/11/20 1731  AST 23  ALT 17  ALKPHOS 322*  BILITOT 0.8  PROT 8.0  ALBUMIN 2.9*   No results for input(s): LIPASE, AMYLASE in the last 168 hours. No results for input(s): AMMONIA in the  last 168 hours. Coagulation Profile: No results for input(s): INR, PROTIME in the last 168 hours. Cardiac Enzymes: No results for input(s): CKTOTAL, CKMB, CKMBINDEX, TROPONINI in the last 168 hours. BNP (last 3 results) No results for input(s): PROBNP in the last 8760 hours. HbA1C: No results for input(s): HGBA1C in the last 72 hours. CBG: No results for input(s): GLUCAP in the last 168 hours. Lipid Profile: No results for input(s): CHOL, HDL, LDLCALC, TRIG, CHOLHDL, LDLDIRECT in the last 72 hours. Thyroid Function Tests: No results for input(s): TSH, T4TOTAL, FREET4, T3FREE, THYROIDAB in the last 72 hours. Anemia Panel: No results for input(s): VITAMINB12, FOLATE, FERRITIN, TIBC, IRON, RETICCTPCT in the last 72 hours. Sepsis Labs: No results for input(s): PROCALCITON, LATICACIDVEN in the last 168 hours.  Recent Results (from the past 240 hour(s))  Respiratory Panel by RT PCR (Flu A&B, Covid) - Nasopharyngeal Swab     Status: None   Collection Time: 03/11/20  3:59 PM   Specimen: Nasopharyngeal Swab  Result Value Ref Range Status   SARS Coronavirus 2 by RT PCR NEGATIVE NEGATIVE Final    Comment: (NOTE) SARS-CoV-2 target nucleic acids are NOT DETECTED.  The SARS-CoV-2 RNA is generally detectable in upper respiratoy specimens during the acute phase of infection. The lowest concentration of SARS-CoV-2 viral copies this assay can detect is 131 copies/mL. A negative result does not preclude SARS-Cov-2 infection and should not be used as the sole basis for treatment or other patient management decisions. A negative result may occur with  improper specimen collection/handling, submission of specimen other than nasopharyngeal swab, presence of viral mutation(s) within the areas targeted by this assay, and inadequate number of viral copies (<131 copies/mL). A negative result must be combined with clinical observations, patient history, and epidemiological information. The expected  result is Negative.  Fact Sheet for Patients:  PinkCheek.be  Fact Sheet for Healthcare Providers:  GravelBags.it  This test is no t yet approved or cleared by the [place] FDA and  has been authorized for detection and/or diagnosis of SARS-CoV-2 by FDA under an Emergency Use Authorization (EUA). This EUA will remain  in effect (meaning this test can be used) for the duration of the COVID-19 declaration under Section 564(b)(1) of the Act, 21 U.S.C. section 360bbb-3(b)(1), unless the authorization is terminated or revoked sooner.     Influenza A by PCR NEGATIVE NEGATIVE Final   Influenza B by PCR NEGATIVE NEGATIVE Final    Comment: (NOTE) The Xpert Xpress SARS-CoV-2/FLU/RSV assay is intended as an aid in  the diagnosis of influenza from Nasopharyngeal swab specimens and  should not be used as a sole basis for treatment. Nasal washings and  aspirates are unacceptable for  Xpert Xpress SARS-CoV-2/FLU/RSV  testing.  Fact Sheet for Patients: PinkCheek.be  Fact Sheet for Healthcare Providers: GravelBags.it  This test is not yet approved or cleared by the [place] FDA and  has been authorized for detection and/or diagnosis of SARS-CoV-2 by  FDA under an Emergency Use Authorization (EUA). This EUA will remain  in effect (meaning this test can be used) for the duration of the  Covid-19 declaration under Section 564(b)(1) of the Act, 21  U.S.C. section 360bbb-3(b)(1), unless the authorization is  terminated or revoked. Performed at [facility], [address], [place], [place] [address]      Radiology Studies: No results found.  Scheduled Meds: . Chlorhexidine Gluconate Cloth  6 each Topical Daily  . dexamethasone  4 mg Oral TID  . feeding supplement (ENSURE ENLIVE)  237 mL Oral BID BM  . heparin  5,000 Units Subcutaneous Q8H  . levETIRAcetam  500 mg Oral  BID  . senna-docusate  2 tablet Oral QHS   Continuous Infusions:   LOS: 7 days   Marylu Lund, MD [facility] Pager On Amion  If 7PM-7AM, please contact night-coverage 03/18/2020, 3:23 PM

## 2020-03-19 ENCOUNTER — Ambulatory Visit
Admission: RE | Admit: 2020-03-19 | Discharge: 2020-03-19 | Disposition: A | Discharge status: Home / Self Care | Payer: Medicaid Other | Source: Ambulatory Visit | Attending: Radiation Oncology | Admitting: Radiation Oncology

## 2020-03-19 DIAGNOSIS — C3411 Malignant neoplasm of upper lobe, right bronchus or lung: Secondary | ICD-10-CM | POA: Diagnosis not present

## 2020-03-19 DIAGNOSIS — C7951 Secondary malignant neoplasm of bone: Secondary | ICD-10-CM | POA: Diagnosis not present

## 2020-03-19 DIAGNOSIS — D649 Anemia, unspecified: Secondary | ICD-10-CM | POA: Diagnosis not present

## 2020-03-19 DIAGNOSIS — G834 Cauda equina syndrome: Secondary | ICD-10-CM | POA: Diagnosis not present

## 2020-03-19 DIAGNOSIS — K5909 Other constipation: Secondary | ICD-10-CM | POA: Diagnosis not present

## 2020-03-19 NOTE — Progress Notes (Signed)
PROGRESS NOTE    Brandon Ferguson  YJE:[id number] DOB: 07-14-1960 DOA: 03/11/2020 PCP: Patient, No Pcp Per    Brief Narrative:  59 y.o.malewith medical history significant formetastatic lung cancer to brain and spine. Patient presented to the ED with complaints of not been able to walkthat has progressed over the past month.   Assessment & Plan:   Principal Problem:   Malignant neoplasm of right upper lobe of lung (HCC) Active Problems:   Metastatic cancer to brain ([place])   Bone metastasis ([place])   Paraplegia ([place])   Constipation   DNR (do not resuscitate)  * Malignant neoplasm of right upper lobe of lung (HCC) -Markedly decreased strength throughout with urinary incontinence and MRI spine findings worrisome for metastatic spread of disease. -Appreciate input and assistance by Palliative Care, who had discussed with pt's Oncologist, Dr. Tommi Rumps -No plans for further chemo. Radiation tx to stop once pt discharges -Pt's wishes now DNR -Prognosis is <3 months -Plan for SNF placement. Follow with TOC regarding placement -Continue analgesia as needed -Pt appears comfortable this AM  Constipation - Pt on chronic opioids - now with bowel movements with cathartics, will continue  Paraplegia (HCC) - likely secondary to above metastatic disease  Bone metastasis ([place]) - secondary to above neoplasm  Metastatic cancer to brain [facility]) - follows with [name].  -See above. Palliative Care following. Plans for no further chemo and plan to end radiation tx after discharge to SNF.  DVT prophylaxis: Heparin subq Code Status: Full Family Communication: Pt in room, family not at bedside  Status is: Inpatient  Remains inpatient appropriate because:Unsafe d/c plan   Dispo: The patient is from: Home              Anticipated d/c is to: SNF              Anticipated d/c date is: 1 day              Patient currently is medically stable to d/c. Just waiting placement    Consultants:  Rad onc Palliative Care  Procedures:     Antimicrobials: Anti-infectives (From admission, onward)   None      Subjective: Asking for assistance arranging meal tray this AM  Objective: Vitals:   03/18/20 1324 03/18/20 2051 03/19/20 0531 03/19/20 1537  BP: 124/81 121/86 110/79 119/88  Pulse: 82 78 81 84  Resp: 15 14 14 18   Temp: 98 F (36.7 C) 97.8 F (36.6 C) 97.7 F (36.5 C) 98 F (36.7 C)  TempSrc: Oral Oral Oral Oral  SpO2: 99% 99% 98% 100%  Weight:      Height:        Intake/Output Summary (Last 24 hours) at 03/19/2020 1618 Last data filed at 03/19/2020 0600 Gross per 24 hour  Intake 717 ml  Output 1200 ml  Net -483 ml   Filed Weights   03/11/20 1549  Weight: 59 kg    Examination: General exam: Awake, laying in bed, in nad Respiratory system: Normal respiratory effort, no wheezing  Data Reviewed: I have personally reviewed following labs and imaging studies  CBC: Recent Labs  Lab 03/13/20 0538 03/14/20 0848 03/15/20 0545 03/16/20 0518 03/17/20 0618  WBC 10.6* 15.7* 14.9* 12.4* 11.2*  NEUTROABS 9.4* 14.4* 13.3* 11.2* 10.2*  HGB 7.2* 8.1* 8.3* 7.9* 7.9*  HCT 24.2* 27.3* 27.4* 26.0* 26.0*  MCV 88.0 87.2 85.9 86.1 86.7  PLT 234 249 279 236 637   Basic Metabolic Panel: Recent Labs  Lab 03/13/20  2542 03/14/20 0848 03/15/20 0545 03/16/20 0518 03/17/20 0618  NA 135 133* 134* 131* 134*  K 4.4 3.7 4.5 4.5 4.3  CL 99 96* 93* 93* 94*  CO2 26 25 26 26 28   GLUCOSE 132* 167* 107* 118* 103*  BUN 13 14 17 20  22*  CREATININE 0.41* 0.42* 0.38* 0.39* 0.30*  CALCIUM 9.7 9.8 10.3 10.2 10.4*  MG 2.0 1.9 2.0 2.0 1.9  PHOS 2.3* 2.6 3.3 2.9 3.1   GFR: Estimated Creatinine Clearance: 81.9 mL/min (A) (by C-G formula based on SCr of 0.3 mg/dL (L)). Liver Function Tests: No results for input(s): AST, ALT, ALKPHOS, BILITOT, PROT, ALBUMIN in the last 168 hours. No results for input(s): LIPASE, AMYLASE in the last 168 hours. No results for  input(s): AMMONIA in the last 168 hours. Coagulation Profile: No results for input(s): INR, PROTIME in the last 168 hours. Cardiac Enzymes: No results for input(s): CKTOTAL, CKMB, CKMBINDEX, TROPONINI in the last 168 hours. BNP (last 3 results) No results for input(s): PROBNP in the last 8760 hours. HbA1C: No results for input(s): HGBA1C in the last 72 hours. CBG: No results for input(s): GLUCAP in the last 168 hours. Lipid Profile: No results for input(s): CHOL, HDL, LDLCALC, TRIG, CHOLHDL, LDLDIRECT in the last 72 hours. Thyroid Function Tests: No results for input(s): TSH, T4TOTAL, FREET4, T3FREE, THYROIDAB in the last 72 hours. Anemia Panel: No results for input(s): VITAMINB12, FOLATE, FERRITIN, TIBC, IRON, RETICCTPCT in the last 72 hours. Sepsis Labs: No results for input(s): PROCALCITON, LATICACIDVEN in the last 168 hours.  Recent Results (from the past 240 hour(s))  Respiratory Panel by RT PCR (Flu A&B, Covid) - Nasopharyngeal Swab     Status: None   Collection Time: 03/11/20  3:59 PM   Specimen: Nasopharyngeal Swab  Result Value Ref Range Status   SARS Coronavirus 2 by RT PCR NEGATIVE NEGATIVE Final    Comment: (NOTE) SARS-CoV-2 target nucleic acids are NOT DETECTED.  The SARS-CoV-2 RNA is generally detectable in upper respiratoy specimens during the acute phase of infection. The lowest concentration of SARS-CoV-2 viral copies this assay can detect is 131 copies/mL. A negative result does not preclude SARS-Cov-2 infection and should not be used as the sole basis for treatment or other patient management decisions. A negative result may occur with  improper specimen collection/handling, submission of specimen other than nasopharyngeal swab, presence of viral mutation(s) within the areas targeted by this assay, and inadequate number of viral copies (<131 copies/mL). A negative result must be combined with clinical observations, patient history, and epidemiological  information. The expected result is Negative.  Fact Sheet for Patients:  PinkCheek.be  Fact Sheet for Healthcare Providers:  GravelBags.it  This test is no t yet approved or cleared by the [place] FDA and  has been authorized for detection and/or diagnosis of SARS-CoV-2 by FDA under an Emergency Use Authorization (EUA). This EUA will remain  in effect (meaning this test can be used) for the duration of the COVID-19 declaration under Section 564(b)(1) of the Act, 21 U.S.C. section 360bbb-3(b)(1), unless the authorization is terminated or revoked sooner.     Influenza A by PCR NEGATIVE NEGATIVE Final   Influenza B by PCR NEGATIVE NEGATIVE Final    Comment: (NOTE) The Xpert Xpress SARS-CoV-2/FLU/RSV assay is intended as an aid in  the diagnosis of influenza from Nasopharyngeal swab specimens and  should not be used as a sole basis for treatment. Nasal washings and  aspirates are unacceptable for Xpert Xpress SARS-CoV-2/FLU/RSV  testing.  Fact Sheet for Patients: PinkCheek.be  Fact Sheet for Healthcare Providers: GravelBags.it  This test is not yet approved or cleared by the [place] FDA and  has been authorized for detection and/or diagnosis of SARS-CoV-2 by  FDA under an Emergency Use Authorization (EUA). This EUA will remain  in effect (meaning this test can be used) for the duration of the  Covid-19 declaration under Section 564(b)(1) of the Act, 21  U.S.C. section 360bbb-3(b)(1), unless the authorization is  terminated or revoked. Performed at [facility], [address], [place], [place] [address]      Radiology Studies: No results found.  Scheduled Meds: . Chlorhexidine Gluconate Cloth  6 each Topical Daily  . dexamethasone  4 mg Oral TID  . feeding supplement (ENSURE ENLIVE)  237 mL Oral BID BM  . heparin  5,000 Units Subcutaneous Q8H  .  levETIRAcetam  500 mg Oral BID  . senna-docusate  2 tablet Oral QHS   Continuous Infusions:   LOS: 8 days   Marylu Lund, MD [facility] Pager On Amion  If 7PM-7AM, please contact night-coverage 03/19/2020, 4:18 PM

## 2020-03-19 NOTE — Progress Notes (Signed)
PT Cancellation Note  Patient Details Name: Brandon Ferguson MRN: [id number] DOB: 1961-04-10   Cancelled Treatment:    Reason Eval/Treat Not Completed: Patient at procedure or test/unavailable    Doreatha Massed, PT Acute Rehabilitation  Office: [phone] Pager: [phone]

## 2020-03-20 ENCOUNTER — Inpatient Hospital Stay: Payer: Medicaid Other

## 2020-03-20 ENCOUNTER — Ambulatory Visit
Admission: RE | Admit: 2020-03-20 | Discharge: 2020-03-20 | Disposition: A | Discharge status: Home / Self Care | Payer: Medicaid Other | Source: Ambulatory Visit | Attending: Radiation Oncology | Admitting: Radiation Oncology

## 2020-03-20 DIAGNOSIS — C7951 Secondary malignant neoplasm of bone: Secondary | ICD-10-CM | POA: Diagnosis not present

## 2020-03-20 DIAGNOSIS — K5909 Other constipation: Secondary | ICD-10-CM | POA: Diagnosis not present

## 2020-03-20 DIAGNOSIS — D649 Anemia, unspecified: Secondary | ICD-10-CM | POA: Diagnosis not present

## 2020-03-20 DIAGNOSIS — G834 Cauda equina syndrome: Secondary | ICD-10-CM | POA: Diagnosis not present

## 2020-03-20 DIAGNOSIS — C3411 Malignant neoplasm of upper lobe, right bronchus or lung: Secondary | ICD-10-CM | POA: Diagnosis not present

## 2020-03-20 DIAGNOSIS — Z23 Encounter for immunization: Secondary | ICD-10-CM

## 2020-03-20 NOTE — Progress Notes (Signed)
One time dose J&J

## 2020-03-20 NOTE — Progress Notes (Signed)
Pt received one time dose J&J.  Murvin Natal, RN-BSN-CCM

## 2020-03-20 NOTE — Progress Notes (Signed)
   Covid-19 Vaccination Clinic  Name:  Brandon Ferguson    MRN: [id number] DOB: 1961-06-07  03/20/2020  [name] was observed post Covid-19 immunization for 15 minutes without incident. He was provided with Vaccine Information Sheet and instruction to access the V-Safe system.   [name] was instructed to call 911 with any severe reactions post vaccine: [name] Kitchen Difficulty breathing  . Swelling of face and throat  . A fast heartbeat  . A bad rash all over body  . Dizziness and weakness   Immunizations Administered    Name Date Dose VIS Date Route   JANSSEN COVID-19 VACCINE 03/20/2020 12:55 PM 0.5 mL 08/06/2019 Intramuscular   Manufacturer: [employer]phonsa Overall   Lot: [id number]   [place]: [phone]

## 2020-03-20 NOTE — Progress Notes (Signed)
PROGRESS NOTE    Brandon Ferguson  SWF:[id number] DOB: 04/09/1961 DOA: 03/11/2020 PCP: Patient, No Pcp Per    Brief Narrative:  59 y.o.malewith medical history significant formetastatic lung cancer to brain and spine. Patient presented to the ED with complaints of not been able to walkthat has progressed over the past month.   Assessment & Plan:   Principal Problem:   Malignant neoplasm of right upper lobe of lung (HCC) Active Problems:   Metastatic cancer to brain ([place])   Bone metastasis ([place])   Paraplegia ([place])   Constipation   DNR (do not resuscitate)  * Malignant neoplasm of right upper lobe of lung (HCC) -Markedly decreased strength throughout with urinary incontinence and MRI spine findings worrisome for metastatic spread of disease. -Appreciate input and assistance by Palliative Care, who had discussed with pt's Oncologist, Dr. Tommi Rumps -No plans for further chemo. Radiation tx to stop once pt discharges -Pt's wishes now DNR -Prognosis is <3 months -Plan for SNF placement. Follow with TOC regarding placement. Pt did receive J&J COVID vaccine 10/12 -Continue analgesia as needed -Remains comfortable appearing this AM  Constipation - Pt on chronic opioids - now with bowel movements with cathartics, will continue  Paraplegia (HCC) - likely secondary to above metastatic disease  Bone metastasis ([place]) - secondary to above neoplasm  Metastatic cancer to brain [facility]) - follows with [name].  -See above. Palliative Care following. Plans for no further chemo and plan to end radiation tx after discharge to SNF.  DVT prophylaxis: Heparin subq Code Status: Full Family Communication: Pt in room, family not at bedside  Status is: Inpatient  Remains inpatient appropriate because:Unsafe d/c plan   Dispo: The patient is from: Home              Anticipated d/c is to: SNF              Anticipated d/c date is: 1 day              Patient currently is medically  stable to d/c. Just waiting placement   Consultants:  Rad onc Palliative Care  Procedures:     Antimicrobials: Anti-infectives (From admission, onward)   None      Subjective: Asking to be lifted up in bed  Objective: Vitals:   03/19/20 1537 03/19/20 2017 03/20/20 0455 03/20/20 1311  BP: 119/88 121/73 110/73 113/74  Pulse: 84 71 78 64  Resp: 18 16 15 20   Temp: 98 F (36.7 C) 97.9 F (36.6 C) 97.6 F (36.4 C) 97.7 F (36.5 C)  TempSrc: Oral Oral Oral Oral  SpO2: 100% 100% 99% 100%  Weight:      Height:        Intake/Output Summary (Last 24 hours) at 03/20/2020 1629 Last data filed at 03/20/2020 1000 Gross per 24 hour  Intake 240 ml  Output 1700 ml  Net -1460 ml   Filed Weights   03/11/20 1549  Weight: 59 kg    Examination: General exam: Conversant, in no acute distress Respiratory system: normal chest rise, clear, no audible wheezing   Data Reviewed: I have personally reviewed following labs and imaging studies  CBC: Recent Labs  Lab 03/14/20 0848 03/15/20 0545 03/16/20 0518 03/17/20 0618  WBC 15.7* 14.9* 12.4* 11.2*  NEUTROABS 14.4* 13.3* 11.2* 10.2*  HGB 8.1* 8.3* 7.9* 7.9*  HCT 27.3* 27.4* 26.0* 26.0*  MCV 87.2 85.9 86.1 86.7  PLT 249 279 236 220   Basic Metabolic Panel: Recent Labs  Lab 03/14/20  2353 03/15/20 0545 03/16/20 0518 03/17/20 0618  NA 133* 134* 131* 134*  K 3.7 4.5 4.5 4.3  CL 96* 93* 93* 94*  CO2 25 26 26 28   GLUCOSE 167* 107* 118* 103*  BUN 14 17 20  22*  CREATININE 0.42* 0.38* 0.39* 0.30*  CALCIUM 9.8 10.3 10.2 10.4*  MG 1.9 2.0 2.0 1.9  PHOS 2.6 3.3 2.9 3.1   GFR: Estimated Creatinine Clearance: 81.9 mL/min (A) (by C-G formula based on SCr of 0.3 mg/dL (L)). Liver Function Tests: No results for input(s): AST, ALT, ALKPHOS, BILITOT, PROT, ALBUMIN in the last 168 hours. No results for input(s): LIPASE, AMYLASE in the last 168 hours. No results for input(s): AMMONIA in the last 168 hours. Coagulation  Profile: No results for input(s): INR, PROTIME in the last 168 hours. Cardiac Enzymes: No results for input(s): CKTOTAL, CKMB, CKMBINDEX, TROPONINI in the last 168 hours. BNP (last 3 results) No results for input(s): PROBNP in the last 8760 hours. HbA1C: No results for input(s): HGBA1C in the last 72 hours. CBG: No results for input(s): GLUCAP in the last 168 hours. Lipid Profile: No results for input(s): CHOL, HDL, LDLCALC, TRIG, CHOLHDL, LDLDIRECT in the last 72 hours. Thyroid Function Tests: No results for input(s): TSH, T4TOTAL, FREET4, T3FREE, THYROIDAB in the last 72 hours. Anemia Panel: No results for input(s): VITAMINB12, FOLATE, FERRITIN, TIBC, IRON, RETICCTPCT in the last 72 hours. Sepsis Labs: No results for input(s): PROCALCITON, LATICACIDVEN in the last 168 hours.  Recent Results (from the past 240 hour(s))  Respiratory Panel by RT PCR (Flu A&B, Covid) - Nasopharyngeal Swab     Status: None   Collection Time: 03/11/20  3:59 PM   Specimen: Nasopharyngeal Swab  Result Value Ref Range Status   SARS Coronavirus 2 by RT PCR NEGATIVE NEGATIVE Final    Comment: (NOTE) SARS-CoV-2 target nucleic acids are NOT DETECTED.  The SARS-CoV-2 RNA is generally detectable in upper respiratoy specimens during the acute phase of infection. The lowest concentration of SARS-CoV-2 viral copies this assay can detect is 131 copies/mL. A negative result does not preclude SARS-Cov-2 infection and should not be used as the sole basis for treatment or other patient management decisions. A negative result may occur with  improper specimen collection/handling, submission of specimen other than nasopharyngeal swab, presence of viral mutation(s) within the areas targeted by this assay, and inadequate number of viral copies (<131 copies/mL). A negative result must be combined with clinical observations, patient history, and epidemiological information. The expected result is Negative.  Fact Sheet  for Patients:  PinkCheek.be  Fact Sheet for Healthcare Providers:  GravelBags.it  This test is no t yet approved or cleared by the [place] FDA and  has been authorized for detection and/or diagnosis of SARS-CoV-2 by FDA under an Emergency Use Authorization (EUA). This EUA will remain  in effect (meaning this test can be used) for the duration of the COVID-19 declaration under Section 564(b)(1) of the Act, 21 U.S.C. section 360bbb-3(b)(1), unless the authorization is terminated or revoked sooner.     Influenza A by PCR NEGATIVE NEGATIVE Final   Influenza B by PCR NEGATIVE NEGATIVE Final    Comment: (NOTE) The Xpert Xpress SARS-CoV-2/FLU/RSV assay is intended as an aid in  the diagnosis of influenza from Nasopharyngeal swab specimens and  should not be used as a sole basis for treatment. Nasal washings and  aspirates are unacceptable for Xpert Xpress SARS-CoV-2/FLU/RSV  testing.  Fact Sheet for Patients: PinkCheek.be  Fact Sheet for  Healthcare Providers: GravelBags.it  This test is not yet approved or cleared by the [place] and  has been authorized for detection and/or diagnosis of SARS-CoV-2 by  FDA under an Emergency Use Authorization (EUA). This EUA will remain  in effect (meaning this test can be used) for the duration of the  Covid-19 declaration under Section 564(b)(1) of the Act, 21  U.S.C. section 360bbb-3(b)(1), unless the authorization is  terminated or revoked. Performed at [facility], [address], [place], [place] [address]      Radiology Studies: No results found.  Scheduled Meds: . Chlorhexidine Gluconate Cloth  6 each Topical Daily  . dexamethasone  4 mg Oral TID  . feeding supplement (ENSURE ENLIVE)  237 mL Oral BID BM  . heparin  5,000 Units Subcutaneous Q8H  . levETIRAcetam  500 mg Oral BID  . senna-docusate  2 tablet  Oral QHS   Continuous Infusions:   LOS: 9 days   Marylu Lund, MD [facility] Pager On Amion  If 7PM-7AM, please contact night-coverage 03/20/2020, 4:29 PM

## 2020-03-20 NOTE — TOC Progression Note (Addendum)
Transition of Care [facility]) - Progression Note    Patient Details  Name: Demetry Bendickson MRN: [id number] Date of Birth: 1960-11-17  [facility] of [facility]) CM/SW Contact  Pilar Westergaard, Marjie Skiff, RN Phone Number: 03/20/2020, 11:38 AM  Clinical Narrative:    This CM reached out to 12 SNFs for placement with no bed offers at this time. [facility] does not have an un-vacinated bed at this time TOC will continue to work on placement.         Social Determinants of Health (SDOH) Interventions    Readmission Risk Interventions No flowsheet data found.

## 2020-03-21 ENCOUNTER — Ambulatory Visit
Admission: RE | Admit: 2020-03-21 | Discharge: 2020-03-21 | Disposition: A | Discharge status: Home / Self Care | Payer: Medicaid Other | Source: Ambulatory Visit | Attending: Radiation Oncology | Admitting: Radiation Oncology

## 2020-03-21 ENCOUNTER — Ambulatory Visit: Payer: Medicaid Other

## 2020-03-21 DIAGNOSIS — C3411 Malignant neoplasm of upper lobe, right bronchus or lung: Secondary | ICD-10-CM | POA: Diagnosis not present

## 2020-03-21 NOTE — TOC Progression Note (Signed)
Transition of Care [facility]) - Progression Note    Patient Details  Name: Brandon Ferguson MRN: [id number] Date of Birth: 18-Dec-1960  [facility]) CM/SW Contact  Berea Majkowski, Marjie Skiff, RN Phone Number: 03/21/2020, 1:37 PM  Clinical Narrative:     This CM was alerted by Melissa at [facility] that they will have a bed available for pt. She is starting auth with [facility] Medicaid. TOC will continue to follow.    Social Determinants of Health (SDOH) Interventions    Readmission Risk Interventions No flowsheet data found.

## 2020-03-21 NOTE — Progress Notes (Signed)
PROGRESS NOTE   Brandon Ferguson  GNF:[id number]    DOB: Apr 24, 1961    DOA: 03/11/2020  PCP: Patient, No Pcp Per   I have briefly reviewed patients previous medical records in [facility].  Chief Complaint  Patient presents with  . V70.1    Brief Narrative:  59 year old male with PMH of metastatic lung cancer to brain and spine presented to the ED with complaints of not being able to walk that had progressed over the past month.  Currently medically stable awaiting SNF admission for long-term care.  TOC working on it.   Assessment & Plan:  Principal Problem:   Malignant neoplasm of right upper lobe of lung (HCC) Active Problems:   Metastatic cancer to brain ([place])   Bone metastasis ([place])   Paraplegia ([place])   Constipation   DNR (do not resuscitate)    Malignant neoplasm of right upper lobe of lung ([place]) -Essentially paraplegic with urinary incontinence and MRI spine findings worrisome for metastatic spread of disease. -Appreciate input and assistance by Palliative Care, who had discussed with pt's Oncologist, Dr. Tommi Rumps -No plans for further chemo. Radiation tx to stop once pt discharges.  Patient reports that he has 2 more radiation therapy is pending. -Pt's wishes now DNR -Prognosis is <3 months -Plan for SNF placement for long-term care.  -I discussed in detail with TOC on 10/13: Difficult to place thus far.  Has contacted almost 12 SNF's but no success.  His Medicaid makes it difficult.  Also reportedly has to wait for about 2 weeks post Covid vaccine before most SNF will accept him.  Finally this afternoon TOC informed that [facility] will have a bed available for patient and will be starting authorization with [facility] Medicaid.   - Pt did receive J&J COVID vaccine 10/12 -Stable without pain or discomfort.  Constipation - Pt on chronic opioids - now with bowel movements with cathartics, will continue  Paraplegia (HCC) - likely secondary to above metastatic  disease  Bone metastasis ([place]) - secondary to above neoplasm  Metastatic cancer to brain [facility]) -follows with [name].  -See above. Palliative Care following. Plans for no further chemo and plan to end radiation tx after discharge to SNF.  Body mass index is 17.64 kg/m.   Anemia of chronic disease/malignancy Stable.  Nutritional Status Nutrition Problem: Increased nutrient needs Etiology: cancer and cancer related treatments Signs/Symptoms: estimated needs Interventions: Ensure Enlive (each supplement provides 350kcal and 20 grams of protein)  DVT prophylaxis: heparin injection 5,000 Units Start: 03/11/20 2200     Code Status: DNR  Family Communication: None at bedside. Disposition:  Status is: Inpatient  Remains inpatient appropriate because:Unsafe d/c plan   Dispo: The patient is from: Home              Anticipated d/c is to: SNF              Anticipated d/c date is: 1 day              Patient currently is medically stable to d/c.        Consultants:   Palliative care medicine Radiation oncology  Procedures:   None  Antimicrobials:    Anti-infectives (From admission, onward)   None        Subjective:  Denies complaints.  Having BMs.  Tolerating diet without nausea or vomiting or abdominal pain.  States that he got the J&J Covid vaccine yesterday and is frustrated that he is not yet able to  Ferguson to a SNF.  Objective:   Vitals:   03/20/20 1311 03/20/20 2215 03/21/20 0540 03/21/20 1653  BP: 113/74 105/69 114/79 115/73  Pulse: 64 71 75 82  Resp: 20 16 14 16   Temp: 97.7 F (36.5 C) 98 F (36.7 C) (!) 97.5 F (36.4 C) 98.2 F (36.8 C)  TempSrc: Oral Oral Oral Oral  SpO2: 100% 100% 100% 100%  Weight:      Height:        General exam: Pleasant young male, moderately built and nourished sitting up comfortably in bed. Respiratory system: Clear to auscultation. Respiratory effort normal. Cardiovascular system: S1 & S2 heard, RRR. No JVD,  murmurs, rubs, gallops or clicks. No pedal edema. Gastrointestinal system: Abdomen is nondistended, soft and nontender. No organomegaly or masses felt. Normal bowel sounds heard. Central nervous system: Alert and oriented. No focal neurological deficits. Extremities: Symmetric 5 x 5 power in upper extremities and grade 0 x 5 power in lower extremities. Skin: No rashes, lesions or ulcers Psychiatry: Judgement and insight appear normal. Mood & affect flat.     Data Reviewed:   I have personally reviewed following labs and imaging studies   CBC: Recent Labs  Lab 03/15/20 0545 03/16/20 0518 03/17/20 0618  WBC 14.9* 12.4* 11.2*  NEUTROABS 13.3* 11.2* 10.2*  HGB 8.3* 7.9* 7.9*  HCT 27.4* 26.0* 26.0*  MCV 85.9 86.1 86.7  PLT 279 236 373    Basic Metabolic Panel: Recent Labs  Lab 03/15/20 0545 03/16/20 0518 03/17/20 0618  NA 134* 131* 134*  K 4.5 4.5 4.3  CL 93* 93* 94*  CO2 26 26 28   GLUCOSE 107* 118* 103*  BUN 17 20 22*  CREATININE 0.38* 0.39* 0.30*  CALCIUM 10.3 10.2 10.4*  MG 2.0 2.0 1.9  PHOS 3.3 2.9 3.1    Liver Function Tests: No results for input(s): AST, ALT, ALKPHOS, BILITOT, PROT, ALBUMIN in the last 168 hours.  CBG: No results for input(s): GLUCAP in the last 168 hours.  Microbiology Studies:  No results found for this or any previous visit (from the past 240 hour(s)).   Radiology Studies:  No results found.   Scheduled Meds:   . Chlorhexidine Gluconate Cloth  6 each Topical Daily  . dexamethasone  4 mg Oral TID  . feeding supplement  237 mL Oral BID BM  . heparin  5,000 Units Subcutaneous Q8H  . levETIRAcetam  500 mg Oral BID  . senna-docusate  2 tablet Oral QHS    Continuous Infusions:     LOS: 10 days     Vernell Leep, MD, [place], [facility]. [facility]    To contact the attending provider between 7A-7P or the covering provider during after hours 7P-7A, please log into the web site [email or web address] and access using universal [facility] password for that web site. If you do not have the password, please call the hospital operator.  03/21/2020, 6:14 PM

## 2020-03-21 NOTE — Progress Notes (Signed)
PT Cancellation Note  Patient Details Name: Marsalis Beaulieu MRN: [id number] DOB: 07-09-1960   Cancelled Treatment:    Reason Eval/Treat Not Completed: Other (comment) (Patient is planning to transition to comfort care and discharge to SNF. Pt no longer appropriate for skilled PT services, will discontinue therapy and hand off mobility to RN staff.)  Gwynneth Albright PT, [place]  Office [phone] Pager [phone]  03/21/2020 12:06 PM

## 2020-03-22 ENCOUNTER — Ambulatory Visit (HOSPITAL_COMMUNITY): Payer: Medicaid Other | Admitting: Hematology

## 2020-03-22 ENCOUNTER — Ambulatory Visit
Admission: RE | Admit: 2020-03-22 | Discharge: 2020-03-22 | Disposition: A | Discharge status: Home / Self Care | Payer: Medicaid Other | Source: Ambulatory Visit | Attending: Radiation Oncology | Admitting: Radiation Oncology

## 2020-03-22 ENCOUNTER — Ambulatory Visit (HOSPITAL_COMMUNITY): Payer: Medicaid Other

## 2020-03-22 ENCOUNTER — Other Ambulatory Visit (HOSPITAL_COMMUNITY): Payer: Medicaid Other

## 2020-03-22 DIAGNOSIS — K5909 Other constipation: Secondary | ICD-10-CM | POA: Diagnosis not present

## 2020-03-22 DIAGNOSIS — C3411 Malignant neoplasm of upper lobe, right bronchus or lung: Secondary | ICD-10-CM | POA: Diagnosis not present

## 2020-03-22 DIAGNOSIS — L8990 Pressure ulcer of unspecified site, unspecified stage: Secondary | ICD-10-CM | POA: Insufficient documentation

## 2020-03-22 DIAGNOSIS — G8220 Paraplegia, unspecified: Secondary | ICD-10-CM | POA: Diagnosis not present

## 2020-03-22 NOTE — Progress Notes (Signed)
PROGRESS NOTE   Brandon Ferguson  YBO:[id number]    DOB: 1960/12/31    DOA: 03/11/2020  PCP: Patient, No Pcp Per   I have briefly reviewed patients previous medical records in [facility].  Chief Complaint  Patient presents with  . V70.1    Brief Narrative:  59 year old male with PMH of metastatic lung cancer to brain and spine presented to the ED with complaints of not being able to walk that had progressed over the past month.  Currently medically stable awaiting SNF admission for long-term care.  TOC working on it.   Assessment & Plan:  Principal Problem:   Malignant neoplasm of right upper lobe of lung (HCC) Active Problems:   Metastatic cancer to brain ([place])   Bone metastasis ([place])   Paraplegia ([place])   Constipation   DNR (do not resuscitate)   Pressure injury of skin    Malignant neoplasm of right upper lobe of lung ([place]) -Essentially paraplegic with urinary incontinence and MRI spine findings worrisome for metastatic spread of disease. -Appreciate input and assistance by Palliative Care, who had discussed with pt's Oncologist, Dr. Tommi Rumps -No plans for further chemo. Radiation tx to stop once pt discharges.  Patient reports that he has 2 more radiation therapy is pending. -Pt's wishes now DNR -Prognosis is <3 months -Plan for SNF placement for long-term care.  -I discussed in detail with TOC on 10/13: Difficult to place thus far.  Had contacted almost 12 SNF's but no success.  His Medicaid makes it difficult.  Also reportedly has to wait for about 2 weeks post Covid vaccine before most SNF will accept him.  Finally TOC updated that [facility] will have a bed available for patient and have started authorization with [facility] Medicaid.  As per TOC, the authorization may take several days and even onto next week or longer.  Patient expressed ongoing frustrations due to not being able to DC soon. - Pt did receive J&J COVID vaccine 10/12 -Stable without pain or  discomfort.  Constipation - Pt on chronic opioids - now with bowel movements with cathartics, will continue  Paraplegia (HCC) - likely secondary to above metastatic disease  Bone metastasis ([place]) - secondary to above neoplasm  Metastatic cancer to brain [facility]) -follows with [name].  -See above. Palliative Care following. Plans for no further chemo and plan to end radiation tx after discharge to SNF.  Body mass index is 17.64 kg/m.   Anemia of chronic disease/malignancy Stable.  Nutritional Status Nutrition Problem: Increased nutrient needs Etiology: cancer and cancer related treatments Signs/Symptoms: estimated needs Interventions: Ensure Enlive (each supplement provides 350kcal and 20 grams of protein)  DVT prophylaxis: heparin injection 5,000 Units Start: 03/11/20 2200     Code Status: DNR  Family Communication: None at bedside. Disposition:  Status is: Inpatient  Remains inpatient appropriate because:Unsafe d/c plan   Dispo: The patient is from: Home              Anticipated d/c is to: SNF              Anticipated d/c date is: 1 day              Patient currently is medically stable to d/c.        Consultants:   Palliative care medicine Radiation oncology  Procedures:   None  Antimicrobials:    Anti-infectives (From admission, onward)   None        Subjective:  No new complaints.  Frustrated that he cannot be discharged soon.  Objective:   Vitals:   03/21/20 1653 03/21/20 2106 03/22/20 0620 03/22/20 1311  BP: 115/73 109/64 (!) 93/55 104/68  Pulse: 82 76 70 93  Resp: 16 16 17 16   Temp: 98.2 F (36.8 C) 98.2 F (36.8 C) 97.9 F (36.6 C) 98.5 F (36.9 C)  TempSrc: Oral Oral Oral Oral  SpO2: 100% 99% 100% 100%  Weight:      Height:       No new exam findings.  General exam: Pleasant young male, moderately built and nourished sitting up comfortably in bed. Respiratory system: Clear to auscultation. Respiratory effort  normal. Cardiovascular system: S1 & S2 heard, RRR. No JVD, murmurs, rubs, gallops or clicks. No pedal edema. Gastrointestinal system: Abdomen is nondistended, soft and nontender. No organomegaly or masses felt. Normal bowel sounds heard. Central nervous system: Alert and oriented. No focal neurological deficits. Extremities: Symmetric 5 x 5 power in upper extremities and grade 0 x 5 power in lower extremities. Skin: No rashes, lesions or ulcers Psychiatry: Judgement and insight appear normal. Mood & affect flat.     Data Reviewed:   I have personally reviewed following labs and imaging studies   CBC: Recent Labs  Lab 03/16/20 0518 03/17/20 0618  WBC 12.4* 11.2*  NEUTROABS 11.2* 10.2*  HGB 7.9* 7.9*  HCT 26.0* 26.0*  MCV 86.1 86.7  PLT 236 161    Basic Metabolic Panel: Recent Labs  Lab 03/16/20 0518 03/17/20 0618  NA 131* 134*  K 4.5 4.3  CL 93* 94*  CO2 26 28  GLUCOSE 118* 103*  BUN 20 22*  CREATININE 0.39* 0.30*  CALCIUM 10.2 10.4*  MG 2.0 1.9  PHOS 2.9 3.1    Liver Function Tests: No results for input(s): AST, ALT, ALKPHOS, BILITOT, PROT, ALBUMIN in the last 168 hours.  CBG: No results for input(s): GLUCAP in the last 168 hours.  Microbiology Studies:  No results found for this or any previous visit (from the past 240 hour(s)).   Radiology Studies:  No results found.   Scheduled Meds:   . Chlorhexidine Gluconate Cloth  6 each Topical Daily  . dexamethasone  4 mg Oral TID  . feeding supplement  237 mL Oral BID BM  . heparin  5,000 Units Subcutaneous Q8H  . levETIRAcetam  500 mg Oral BID  . senna-docusate  2 tablet Oral QHS    Continuous Infusions:     LOS: 11 days     Vernell Leep, MD, [place], [facility]. [facility]    To contact the attending provider between 7A-7P or the covering provider during after hours 7P-7A, please log into the web site [email or web address] and access using universal [place] password for that web site. If you do  not have the password, please call the hospital operator.  03/22/2020, 3:45 PM

## 2020-03-22 NOTE — Progress Notes (Signed)
OT Cancellation Note  Patient Details Name: Brandon Ferguson MRN: [id number] DOB: November 16, 1960   Cancelled Treatment:    Reason Eval/Treat Not Completed: Other (comment). Patient is planning to transition to comfort care and discharge to SNF with Hospice. OT will sign off. Please reconsult if GOC change.   [name] [name] 03/22/2020, 7:44 AM

## 2020-03-23 DIAGNOSIS — G8220 Paraplegia, unspecified: Secondary | ICD-10-CM | POA: Diagnosis not present

## 2020-03-23 DIAGNOSIS — K219 Gastro-esophageal reflux disease without esophagitis: Secondary | ICD-10-CM | POA: Diagnosis not present

## 2020-03-23 DIAGNOSIS — C7951 Secondary malignant neoplasm of bone: Secondary | ICD-10-CM | POA: Diagnosis not present

## 2020-03-23 DIAGNOSIS — M8448XD Pathological fracture, other site, subsequent encounter for fracture with routine healing: Secondary | ICD-10-CM | POA: Diagnosis not present

## 2020-03-23 DIAGNOSIS — K5909 Other constipation: Secondary | ICD-10-CM | POA: Diagnosis not present

## 2020-03-23 DIAGNOSIS — L89153 Pressure ulcer of sacral region, stage 3: Secondary | ICD-10-CM | POA: Diagnosis not present

## 2020-03-23 DIAGNOSIS — K5900 Constipation, unspecified: Secondary | ICD-10-CM | POA: Diagnosis not present

## 2020-03-23 DIAGNOSIS — Z95828 Presence of other vascular implants and grafts: Secondary | ICD-10-CM | POA: Diagnosis not present

## 2020-03-23 DIAGNOSIS — Z20822 Contact with and (suspected) exposure to covid-19: Secondary | ICD-10-CM | POA: Diagnosis not present

## 2020-03-23 DIAGNOSIS — D649 Anemia, unspecified: Secondary | ICD-10-CM | POA: Diagnosis not present

## 2020-03-23 DIAGNOSIS — C7931 Secondary malignant neoplasm of brain: Secondary | ICD-10-CM | POA: Diagnosis not present

## 2020-03-23 DIAGNOSIS — C3411 Malignant neoplasm of upper lobe, right bronchus or lung: Secondary | ICD-10-CM | POA: Diagnosis not present

## 2020-03-23 LAB — RESPIRATORY PANEL BY RT PCR (FLU A&B, COVID)
Influenza A by PCR: NEGATIVE
Influenza B by PCR: NEGATIVE
SARS Coronavirus 2 by RT PCR: NEGATIVE

## 2020-03-23 MED ORDER — ENSURE ENLIVE PO LIQD: 237.0000 mL | Freq: Two times a day (BID) | ORAL | Status: AC

## 2020-03-23 MED ORDER — SENNOSIDES-DOCUSATE SODIUM 8.6-50 MG PO TABS: 2.0000 | ORAL_TABLET | Freq: Every day | ORAL | Status: AC

## 2020-03-23 MED ORDER — ACETAMINOPHEN 325 MG PO TABS: 650.0000 mg | ORAL_TABLET | Freq: Four times a day (QID) | ORAL | Status: AC | PRN

## 2020-03-23 MED ORDER — HEPARIN SOD (PORK) LOCK FLUSH 100 UNIT/ML IV SOLN
500.0000 [IU] | INTRAVENOUS | Status: DC | PRN
  Filled 2020-03-23: qty 5

## 2020-03-23 NOTE — TOC Transition Note (Signed)
Transition of Care [facility]) - CM/SW Discharge Note   Patient Details  Name: Brandon Ferguson MRN: [id number] Date of Birth: Mar 21, 1961  Transition of [facility]) CM/SW Contact:  Lynnell Catalan, RN Phone Number: 03/23/2020, 11:36 AM   Clinical Narrative:     [name] received insurance auth for SNF. This CM contacted pt APS worker Patriciaann Clan [phone]) to alert her of dc. Covid test came back negative. Yellow DNR on chart for dc. Pt to dc via PTAR. RN to call report to [phone].

## 2020-03-23 NOTE — Progress Notes (Signed)
Attempted to call [place] to give report on Brandon Ferguson x3. All three times I have been unable to communicate with the nurse who will be taking care of Brandon Ferguson.

## 2020-03-23 NOTE — Discharge Instructions (Signed)

## 2020-03-23 NOTE — Discharge Summary (Signed)
Physician Discharge Summary  Brandon Ferguson YKD:[id number] DOB: Oct 09, 1960  PCP: Patient, No Pcp Per  Admitted from: Home Discharged to: SNF for LTC.  Admit date: 03/11/2020 Discharge date: 03/23/2020  Recommendations for Outpatient Follow-up:    Follow-up Information    MD at SNF and 2 to 3 days.. Schedule an appointment as soon as possible for a visit.   Why: Recommend hospice consultation and follow-up at SNF.               Home Health: N/A Equipment/Devices: TBD at SNF  Discharge Condition: Stable but overall prognosis is poor, likely less than 3 months. CODE STATUS: DNR Diet recommendation: Regular diet.  Discharge Diagnoses:  Principal Problem:   Malignant neoplasm of right upper lobe of lung (HCC) Active Problems:   Metastatic cancer to brain ([place])   Bone metastasis ([place])   Paraplegia (HCC)   Constipation   DNR (do not resuscitate)   Pressure injury of skin   Brief Summary: 59 year old male with PMH of metastatic lung cancer to brain and spine presented to the ED with complaints of not being able to walk that had progressed over the past month.    Assessment & Plan:   Malignant neoplasm of right upper lobe of lung ([place]) -Essentially paraplegic with urinary incontinence and MRI spine findings worrisome for metastatic spread of disease. -Appreciate input and assistance by Palliative Care, who had discussed with pt's Oncologist, Dr. Tommi Rumps -No plans for further chemo. Radiation tx to stop once pt discharges.  -Pt's is now DNR. -Prognosis is <3 months -Plan for SNF placement for long-term care.  Recommend hospice consultation and follow-up at SNF. -Patient's pain is controlled on Tylenol alone and he has not used any opioids for several days now. -Recommend frequent turning in bed and an air overlay mattress given his paraplegia and nonambulatory status to avoid bedsores. -Patient received J&J COVID vaccine on 03/20/2020.  Constipation - now with  bowel movements with laxatives, continue.  Paraplegia ([place]) - likely secondary to above metastatic disease  Bone metastasis ([place]) - secondary to above neoplasm  Metastatic cancer to brain [facility]) -follows with [name].  -See above. Palliative Care following. Plans for no further chemo and plan to end radiation tx after discharge to SNF.  Body mass index is 17.64 kg/m.   Anemia of chronic disease/malignancy Stable.  Nutritional Status Nutrition Problem: Increased nutrient needs Etiology: cancer and cancer related treatments Signs/Symptoms: estimated needs Interventions: Ensure Enlive (each supplement provides 350kcal and 20 grams of protein)  Pressure Injury 03/18/20 Coccyx Medial Stage 2 -  Partial thickness loss of dermis presenting as a shallow open injury with a red, pink wound bed without slough. (Active)  03/18/20 1650  Location: Coccyx  Location Orientation: Medial  Staging: Stage 2 -  Partial thickness loss of dermis presenting as a shallow open injury with a red, pink wound bed without slough.  Wound Description (Comments):   Present on Admission:        Consultants:   Palliative care medicine Radiation oncology  Procedures:   None   Discharge Instructions  Discharge Instructions    Call MD for:  difficulty breathing, headache or visual disturbances   Complete by: As directed    Call MD for:  extreme fatigue   Complete by: As directed    Call MD for:  hives   Complete by: As directed    Call MD for:  persistant dizziness or light-headedness   Complete by: As directed    Call  MD for:  persistant nausea and vomiting   Complete by: As directed    Call MD for:  redness, tenderness, or signs of infection (pain, swelling, redness, odor or green/yellow discharge around incision site)   Complete by: As directed    Call MD for:  severe uncontrolled pain   Complete by: As directed    Call MD for:  temperature >100.4   Complete by: As directed     Diet general   Complete by: As directed    Increase activity slowly   Complete by: As directed    No wound care   Complete by: As directed        Medication List    STOP taking these medications   CARBOPLATIN IV   folic acid 1 MG tablet Commonly known as: FOLVITE   Golytely 236 g solution Generic drug: polyethylene glycol   HYDROcodone-acetaminophen 5-325 MG tablet Commonly known as: Norco   lidocaine-prilocaine cream Commonly known as: EMLA   magnesium oxide 400 (241.3 Mg) MG tablet Commonly known as: MAG-OX   PEMBROLIZUMAB IV   PEMEtrexed 500 mg/m2 in sodium chloride 0.9 % 100 mL     TAKE these medications   acetaminophen 325 MG tablet Commonly known as: TYLENOL Take 2 tablets (650 mg total) by mouth every 6 (six) hours as needed for mild pain, moderate pain, fever or headache (or Fever >/= 101).   dexamethasone 4 MG tablet Commonly known as: DECADRON Take 1 tablet (4 mg total) by mouth 3 (three) times daily.   feeding supplement Liqd Take 237 mLs by mouth 2 (two) times daily between meals.   levETIRAcetam 500 MG tablet Commonly known as: KEPPRA Take 1 tablet (500 mg total) by mouth 2 (two) times daily.   pantoprazole 40 MG tablet Commonly known as: PROTONIX Take 40 mg by mouth daily.   prochlorperazine 10 MG tablet Commonly known as: COMPAZINE Take 1 tablet (10 mg total) by mouth every 6 (six) hours as needed (Nausea or vomiting).   senna-docusate 8.6-50 MG tablet Commonly known as: Senokot-S Take 2 tablets by mouth at bedtime.      No Known Allergies    Procedures/Studies: DG Abdomen 1 View  Result Date: 03/10/2020 CLINICAL DATA:  Constipation. EXAM: ABDOMEN - 1 VIEW COMPARISON:  None. FINDINGS: No abnormal bowel dilatation is noted. Large amount of stool seen throughout the colon. No radio-opaque calculi or other significant radiographic abnormality are seen. IMPRESSION: Large stool burden. No evidence of bowel obstruction or ileus.  Electronically Signed   By: Marijo Conception M.D.   On: 03/10/2020 11:36   MR THORACIC SPINE W WO CONTRAST  Result Date: 03/06/2020 CLINICAL DATA:  59 year old male with widely metastatic lung cancer. SRS treatment planning for T1 and T9 metastases. EXAM: MRI THORACIC WITHOUT AND WITH CONTRAST TECHNIQUE: Multiplanar and multiecho pulse sequences of the thoracic spine were obtained without and with intravenous contrast. CONTRAST:  5.77mL GADAVIST GADOBUTROL 1 MMOL/ML IV SOLN COMPARISON:  Thoracic and lumbar MRI 02/01/2020. FINDINGS: Limited cervical spine imaging: Cervical vertebral metastases sparing only the C1 level. Thoracic spine segmentation: Appears to be normal, and is the same numbering system used on the August MRI. Alignment:  Stable thoracic kyphosis. Vertebrae: Diffuse thoracic vertebral metastatic disease, with all levels affected as before. The T4 vertebra appears least affected as in August. Mild progression of T5 vertebral body involvement since August. Widespread rib metastases. Dedicated thin slice imaging through T1 where pathologic compression fracture is redemonstrated. Retropulsed bone and/or tumor (series  11, image 20) resulting in moderate cord compression. Associated cord mass effect appears mildly increased from last month, with no obvious cord edema. Associated bilateral T1 foraminal stenosis was better demonstrated using the standard protocol last month. Dedicated thin slice imaging through T9. Extensive infiltration of the left body and left greater than right posterior elements of that level (series 11, image 66) with expansion of the bone and extraosseous extension obliterating the T9 neural foramen, especially the left. But only borderline to mild malignant spinal stenosis there at this time. No cord mass effect. No pathologic fracture. Cord: Moderate cord compression at the T1 level as stated above. No other thoracic cord mass effect identified. No cord edema identified. No  abnormal intradural enhancement identified. Paraspinal and other soft tissues: Substantially progressed and now 4.5 cm long axis posterior right paraspinal metastasis at the T11 level. See series 8, image 4 and series 11, image 95. This was only a subtle 10 mm lesion in August (on series 20, image 36 at that time. Spiculated posterior right upper lobe lung mass. No pericardial or pleural effusion. Stable visible upper abdominal viscera. IMPRESSION: 1. Study for stereotactic radio surgery planning at T1 and T9. 2. Pathologic T1 compression fracture with bulky retropulsion of bone and/or tumor resulting in moderate cord compression (series 11, image 18) and moderate to severe bilateral T1 foraminal stenosis. 3. Infiltrated and expanded T9 vertebra, a especially the posterior elements on the left. Obliterated T9 neural foramina, especially the left. Early epidural space extension with mild spinal stenosis but no cord compression. 4. No spinal cord edema or intradural metastasis identified. 5. Incidentally noted substantially enlarged T11 level right posterior paraspinal muscle metastasis since August, now up to 4.5 cm (series 8, image 4). 6. Underlying diffuse spinal and rib metastases. Electronically Signed   By: Genevie Ann M.D.   On: 03/06/2020 04:54   MR Lumbar Spine W Wo Contrast  Result Date: 03/07/2020 CLINICAL DATA:  Spine metastasis. Bone lesion, LS spine malignancy suspected; left leg weakness and inability to stand. Additional history provided by scanning technologist: Patient reports back pain and inability to move left leg for 1 week, metastatic lung cancer. EXAM: MRI LUMBAR SPINE WITHOUT AND WITH CONTRAST TECHNIQUE: Multiplanar and multiecho pulse sequences of the lumbar spine were obtained without and with intravenous contrast. CONTRAST:  76mL GADAVIST GADOBUTROL 1 MMOL/ML IV SOLN COMPARISON:  Lumbar spine MRI 02/01/2020. FINDINGS: Segmentation: 5 lumbar vertebrae. The lowest well-formed intervertebral  disc space is designated L5-S1 Alignment:  Unchanged 2 mm L4-L5 grade 1 retrolisthesis. Vertebrae: Redemonstrated widespread osseous metastatic disease throughout the visualized lumbar and sacral spine and within the bilateral iliac bones. Unchanged L4 vertebral body compression fracture with 40 % height loss. As before, the most prominent lesions are within the T12, L1, L2, L4 and L5 vertebrae as well as left sacral ala and right iliac bone. A metastasis within the posterior aspect of the L2 vertebral body has increased in size and now demonstrates extraosseous extension into the left aspect of the spinal canal and L1-L2 left lateral recess (series 5, image 8) (series 11, image 8). Conus medullaris and cauda equina: Conus extends to the L1 level. No signal abnormality within the visualized distal spinal cord. No abnormal enhancement of the visualized distal spinal cord or cauda equina nerve roots. Paraspinal and other soft tissues: No acute abnormality is identified within included portions of the abdomen/retroperitoneum. Atrophy of the lumbar paraspinal musculature. Disc levels: Unless otherwise stated, the level by level findings below have  not significantly changed since prior MRI 02/01/2020. Moderate disc degeneration at L3-L4, L4-L5 and L5-S1. T12-L1: This level is imaged sagittally. No significant disc herniation or stenosis. L1-L2: Interval progression of a metastasis within the posterior aspect of the L2 vertebral body now with extraosseous extension into the left ventral spinal canal at the L2 vertebral body level and into the left L1-L2 lateral recess. Extraosseous tumor encroaches upon the descending and exiting left L2 nerve root. No significant foraminal stenosis. L2-L3: Progressive extraosseous extension of tumor within the L2 vertebral body with encroachment upon the descending and exiting left L2 nerve root within the left L1-L2 lateral recess no significant spinal canal stenosis at the L2-L3 disc  level. No significant foraminal stenosis. L3-L4: Disc bulge. Facet hypertrophy with mild ligamentum flavum hypertrophy. Mild/moderate bilateral subarticular stenosis (greater on the right) with slight crowding of the descending right L4 nerve root. Mild relative narrowing of the spinal canal. No significant foraminal stenosis L4-L5: Grade 1 retrolisthesis. Mild bony retropulsion at the level of the L4 inferior endplate. Associated disc bulge and endplate spurring. Facet and ligamentum flavum hypertrophy. Multifactorial severe bilateral subarticular stenosis with likely impingement of the bilateral descending L5 nerve roots. Additionally, there may be impingement of the nerve roots within the central canal. Mild right neural foraminal narrowing. L5-S1: Disc bulge with endplate spurring. Mild facet and ligamentum flavum hypertrophy. Mild bilateral subarticular narrowing with contact upon the descending S1 nerve roots. Central canal patent. No significant foraminal stenosis. Impressions #2 and #3 will be called to the ordering clinician or representative by the Radiologist Assistant, and communication documented in the PACS or [employer]. IMPRESSION: Redemonstrated widespread osseous metastatic disease throughout the visualized lumbosacral spine and bilateral iliac bones. Since the prior MRI of 02/01/2020, there has been progression of a metastasis within the posterior aspect of the L2 vertebral body now with extraosseous extension. The extraosseous tumor extends into the left ventral aspect of the spinal canal at the L2 vertebral body level and into the L1-L2 left lateral recess. There is resultant encroachment upon the descending and exiting left L2 nerve root. At L4-L5, there is multifactorial severe bilateral subarticular and central canal stenosis which may have subtly progressed. There is likely impingement of the bilateral descending L5 nerve roots. Additionally, there may be impingement of the descending  cauda equina nerve roots within the central canal. Unchanged mild right neural foraminal narrowing. Lumbar spondylosis is otherwise unchanged. No more than mild central canal stenosis at the remaining levels. Additional sites of subarticular and neural foraminal stenosis as detailed. Unchanged L4 vertebral body height loss (40%). Electronically Signed   By: Kellie Simmering DO   On: 03/07/2020 15:56      Subjective: No complaints reported.  Patient evaluated along with RN in room.  Patient is aware that he is being discharged to SNF today.  Discharge Exam:  Vitals:   03/22/20 0620 03/22/20 1311 03/22/20 2154 03/23/20 0458  BP: (!) 93/55 104/68 111/74 110/76  Pulse: 70 93 82 72  Resp: 17 16 16 14   Temp: 97.9 F (36.6 C) 98.5 F (36.9 C) 97.9 F (36.6 C) (!) 97.5 F (36.4 C)  TempSrc: Oral Oral Oral Oral  SpO2: 100% 100% 100% 99%  Weight:      Height:        General exam: Pleasant young male, moderately built and nourished sitting up comfortably in bed. Respiratory system: Clear to auscultation. Respiratory effort normal. Cardiovascular system: S1 & S2 heard, RRR. No JVD, murmurs, rubs, gallops  or clicks. No pedal edema. Gastrointestinal system: Abdomen is nondistended, soft and nontender. No organomegaly or masses felt. Normal bowel sounds heard. Central nervous system: Alert and oriented. No focal neurological deficits. Extremities: Symmetric 5 x 5 power in upper extremities and grade 0 x 5 power in lower extremities. Skin: No rashes, lesions or ulcers Psychiatry: Judgement and insight appear normal. Mood & affect flat.     The results of significant diagnostics from this hospitalization (including imaging, microbiology, ancillary and laboratory) are listed below for reference.     Microbiology: Recent Results (from the past 240 hour(s))  Respiratory Panel by RT PCR (Flu A&B, Covid) - Nasopharyngeal Swab     Status: None   Collection Time: 03/23/20  9:48 AM   Specimen:  Nasopharyngeal Swab  Result Value Ref Range Status   SARS Coronavirus 2 by RT PCR NEGATIVE NEGATIVE Final    Comment: (NOTE) SARS-CoV-2 target nucleic acids are NOT DETECTED.  The SARS-CoV-2 RNA is generally detectable in upper respiratoy specimens during the acute phase of infection. The lowest concentration of SARS-CoV-2 viral copies this assay can detect is 131 copies/mL. A negative result does not preclude SARS-Cov-2 infection and should not be used as the sole basis for treatment or other patient management decisions. A negative result may occur with  improper specimen collection/handling, submission of specimen other than nasopharyngeal swab, presence of viral mutation(s) within the areas targeted by this assay, and inadequate number of viral copies (<131 copies/mL). A negative result must be combined with clinical observations, patient history, and epidemiological information. The expected result is Negative.  Fact Sheet for Patients:  PinkCheek.be  Fact Sheet for Healthcare Providers:  GravelBags.it  This test is no t yet approved or cleared by the [place] FDA and  has been authorized for detection and/or diagnosis of SARS-CoV-2 by FDA under an Emergency Use Authorization (EUA). This EUA will remain  in effect (meaning this test can be used) for the duration of the COVID-19 declaration under Section 564(b)(1) of the Act, 21 U.S.C. section 360bbb-3(b)(1), unless the authorization is terminated or revoked sooner.     Influenza A by PCR NEGATIVE NEGATIVE Final   Influenza B by PCR NEGATIVE NEGATIVE Final    Comment: (NOTE) The Xpert Xpress SARS-CoV-2/FLU/RSV assay is intended as an aid in  the diagnosis of influenza from Nasopharyngeal swab specimens and  should not be used as a sole basis for treatment. Nasal washings and  aspirates are unacceptable for Xpert Xpress SARS-CoV-2/FLU/RSV  testing.  Fact Sheet  for Patients: PinkCheek.be  Fact Sheet for Healthcare Providers: GravelBags.it  This test is not yet approved or cleared by the [place] FDA and  has been authorized for detection and/or diagnosis of SARS-CoV-2 by  FDA under an Emergency Use Authorization (EUA). This EUA will remain  in effect (meaning this test can be used) for the duration of the  Covid-19 declaration under Section 564(b)(1) of the Act, 21  U.S.C. section 360bbb-3(b)(1), unless the authorization is  terminated or revoked. Performed at [facility], [place] [address] [place], [place] [address]      Labs: CBC: Recent Labs  Lab 03/17/20 0618  WBC 11.2*  NEUTROABS 10.2*  HGB 7.9*  HCT 26.0*  MCV 86.7  PLT 782    Basic Metabolic Panel: Recent Labs  Lab 03/17/20 0618  NA 134*  K 4.3  CL 94*  CO2 28  GLUCOSE 103*  BUN 22*  CREATININE 0.30*  CALCIUM 10.4*  MG 1.9  PHOS 3.1    I called and updated patient's sister via phone, answered questions and advised her that patient is being discharged to SNF today.  She verbalized understanding.  Time coordinating discharge: 35 minutes  SIGNED:  Vernell Leep, MD, [place], [facility]. [facility]  To contact the attending provider between 7A-7P or the covering provider during after hours 7P-7A, please log into the web site [email or web address] and access using universal [place] password for that web site. If you do not have the password, please call the hospital operator.

## 2020-03-26 ENCOUNTER — Telehealth: Payer: Self-pay | Admitting: Radiation Oncology

## 2020-03-26 NOTE — Telephone Encounter (Signed)
  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   05/30/2019-06/14/2019: The right lung target including T3, lumbar spine, right acetabulum and right pubic ramus were all treated to 30 Gy in 10 fractions.  05/04/2019-05/19/2019: The whole brain was treated to 30 Gy in 10 fractions   Narrative:  Brandon Ferguson is a pleasant 59 year old gentleman with a history of stage IV adenocarcinoma of the right upper lobe who has had multiple treatments to the brain and bone. He recently completed a palliative course to his lumbar spine and T spine. He was discharged from the hospital to SNF. I called to check on him since there was quite a bit of concern about his safety at home prior to his readmission. He was discharged to [facility] over the weekend and I called him today. He is distraught about not being cared for or closer to home in [place] and not having bathing care at the facility he's at. He reports he is now able to move his extremities better than prior to radiotherapy.   Impression/Plan: 1. 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 I spent time by phone today updating myself with the patient's current situation. We will be available as needed while he pursues hospice care. I'm  reaching out as well to [facility] hospice/palliative care  as well to see how he can be helped and to make sure they know about some of the concerns he's verbalizing about not getting bathing care.     Carola Rhine, PAC

## 2020-03-28 DIAGNOSIS — K219 Gastro-esophageal reflux disease without esophagitis: Secondary | ICD-10-CM | POA: Diagnosis not present

## 2020-03-28 DIAGNOSIS — G8220 Paraplegia, unspecified: Secondary | ICD-10-CM | POA: Diagnosis not present

## 2020-03-28 DIAGNOSIS — M8448XD Pathological fracture, other site, subsequent encounter for fracture with routine healing: Secondary | ICD-10-CM | POA: Diagnosis not present

## 2020-03-28 DIAGNOSIS — D649 Anemia, unspecified: Secondary | ICD-10-CM | POA: Diagnosis not present

## 2020-03-28 DIAGNOSIS — L89153 Pressure ulcer of sacral region, stage 3: Secondary | ICD-10-CM | POA: Diagnosis not present

## 2020-03-28 DIAGNOSIS — Z95828 Presence of other vascular implants and grafts: Secondary | ICD-10-CM | POA: Diagnosis not present

## 2020-03-28 DIAGNOSIS — C7951 Secondary malignant neoplasm of bone: Secondary | ICD-10-CM | POA: Diagnosis not present

## 2020-03-28 DIAGNOSIS — Z20822 Contact with and (suspected) exposure to covid-19: Secondary | ICD-10-CM | POA: Diagnosis not present

## 2020-03-28 DIAGNOSIS — K5900 Constipation, unspecified: Secondary | ICD-10-CM | POA: Diagnosis not present

## 2020-03-28 DIAGNOSIS — C3411 Malignant neoplasm of upper lobe, right bronchus or lung: Secondary | ICD-10-CM | POA: Diagnosis not present

## 2020-03-28 DIAGNOSIS — C7931 Secondary malignant neoplasm of brain: Secondary | ICD-10-CM | POA: Diagnosis not present

## 2020-04-04 DIAGNOSIS — Z20822 Contact with and (suspected) exposure to covid-19: Secondary | ICD-10-CM | POA: Diagnosis not present

## 2020-04-04 DIAGNOSIS — C3411 Malignant neoplasm of upper lobe, right bronchus or lung: Secondary | ICD-10-CM | POA: Diagnosis not present

## 2020-04-04 DIAGNOSIS — C7931 Secondary malignant neoplasm of brain: Secondary | ICD-10-CM | POA: Diagnosis not present

## 2020-04-04 DIAGNOSIS — D649 Anemia, unspecified: Secondary | ICD-10-CM | POA: Diagnosis not present

## 2020-04-04 DIAGNOSIS — F419 Anxiety disorder, unspecified: Secondary | ICD-10-CM | POA: Diagnosis not present

## 2020-04-04 DIAGNOSIS — Z95828 Presence of other vascular implants and grafts: Secondary | ICD-10-CM | POA: Diagnosis not present

## 2020-04-04 DIAGNOSIS — G894 Chronic pain syndrome: Secondary | ICD-10-CM | POA: Diagnosis not present

## 2020-04-04 DIAGNOSIS — R531 Weakness: Secondary | ICD-10-CM | POA: Diagnosis not present

## 2020-04-04 DIAGNOSIS — K219 Gastro-esophageal reflux disease without esophagitis: Secondary | ICD-10-CM | POA: Diagnosis not present

## 2020-04-04 DIAGNOSIS — C7951 Secondary malignant neoplasm of bone: Secondary | ICD-10-CM | POA: Diagnosis not present

## 2020-04-04 DIAGNOSIS — Z87891 Personal history of nicotine dependence: Secondary | ICD-10-CM | POA: Diagnosis not present

## 2020-04-04 DIAGNOSIS — L89153 Pressure ulcer of sacral region, stage 3: Secondary | ICD-10-CM | POA: Diagnosis not present

## 2020-04-04 DIAGNOSIS — G8220 Paraplegia, unspecified: Secondary | ICD-10-CM | POA: Diagnosis not present

## 2020-04-04 DIAGNOSIS — M8448XD Pathological fracture, other site, subsequent encounter for fracture with routine healing: Secondary | ICD-10-CM | POA: Diagnosis not present

## 2020-04-04 DIAGNOSIS — K5900 Constipation, unspecified: Secondary | ICD-10-CM | POA: Diagnosis not present

## 2020-04-05 NOTE — Procedures (Signed)
° °  Name: Brandon Ferguson  MRN: [id number]  Date: 03/13/2020   DOB: May 06, 1961  Stereotactic Radiosurgery Operative Note  PRE-OPERATIVE DIAGNOSIS:  Spinal Metastasis  POST-OPERATIVE DIAGNOSIS:  Spinal Metastasis  PROCEDURE:  Stereotactic Radiosurgery  SURGEON:  Elaina Hoops, MD  NARRATIVE: The patient underwent a radiation treatment planning session in the radiation oncology simulation suite under the care of the radiation oncology physician and physicist.  I participated closely in the radiation treatment planning afterwards. The patient underwent planning CT myelogram which was fused to the MRI.  These images were fused on the planning system.  Radiation oncology contoured the gross target volume and subsequently expanded this to yield the Planning Target Volume. I actively participated in the planning process.  I helped to define and review the target contours and also the contours of the spinal cord, and selected nearby organs at risk.  All the dose constraints for critical structures were reviewed and compared to AAPM Task Group 101.  The prescription dose conformity was reviewed.  I approved the plan electronically.    Accordingly, [name] was brought to the TrueBeam stereotactic radiation treatment linac and placed in the custom immobilization device.  The patient was aligned according to the IR fiducial markers with BrainLab Exactrac, then orthogonal x-rays were used in ExacTrac with the 6DOF robotic table and the shifts were made to align the patient.  Then conebeam CT was performed to verify precision.  [name] received stereotactic radiosurgery uneventfully.  The detailed description of the procedure is recorded in the radiation oncology procedure note.  I was present for the duration of the procedure.  DISPOSITION:  Following delivery, the patient was transported to nursing in stable condition and monitored for possible acute effects to be discharged to home in stable  condition with follow-up in one month.  Elaina Hoops, MD 04/05/2020 4:23 PM

## 2020-04-12 ENCOUNTER — Other Ambulatory Visit: Payer: Self-pay | Admitting: Radiation Therapy

## 2020-04-16 ENCOUNTER — Telehealth: Payer: Self-pay | Admitting: Radiation Oncology

## 2020-04-17 ENCOUNTER — Encounter: Payer: Self-pay | Admitting: Radiation Oncology

## 2020-04-17 NOTE — Progress Notes (Signed)
Discussed pt's case in palliative care conference and Dr. Mallie Mussel plans to follow up with [facility] to see if [facility] can formally consult as pt declined services from [facility].

## 2020-04-25 DIAGNOSIS — R531 Weakness: Secondary | ICD-10-CM | POA: Diagnosis not present

## 2020-04-25 DIAGNOSIS — Z20822 Contact with and (suspected) exposure to covid-19: Secondary | ICD-10-CM | POA: Diagnosis not present

## 2020-04-25 DIAGNOSIS — C7931 Secondary malignant neoplasm of brain: Secondary | ICD-10-CM | POA: Diagnosis not present

## 2020-04-25 DIAGNOSIS — K5900 Constipation, unspecified: Secondary | ICD-10-CM | POA: Diagnosis not present

## 2020-04-25 DIAGNOSIS — C7951 Secondary malignant neoplasm of bone: Secondary | ICD-10-CM | POA: Diagnosis not present

## 2020-04-25 DIAGNOSIS — M8448XD Pathological fracture, other site, subsequent encounter for fracture with routine healing: Secondary | ICD-10-CM | POA: Diagnosis not present

## 2020-04-25 DIAGNOSIS — F419 Anxiety disorder, unspecified: Secondary | ICD-10-CM | POA: Diagnosis not present

## 2020-04-25 DIAGNOSIS — C3411 Malignant neoplasm of upper lobe, right bronchus or lung: Secondary | ICD-10-CM | POA: Diagnosis not present

## 2020-04-25 DIAGNOSIS — D649 Anemia, unspecified: Secondary | ICD-10-CM | POA: Diagnosis not present

## 2020-04-25 DIAGNOSIS — Z87891 Personal history of nicotine dependence: Secondary | ICD-10-CM | POA: Diagnosis not present

## 2020-04-25 DIAGNOSIS — G8220 Paraplegia, unspecified: Secondary | ICD-10-CM | POA: Diagnosis not present

## 2020-04-25 DIAGNOSIS — L89153 Pressure ulcer of sacral region, stage 3: Secondary | ICD-10-CM | POA: Diagnosis not present

## 2020-04-25 DIAGNOSIS — Z95828 Presence of other vascular implants and grafts: Secondary | ICD-10-CM | POA: Diagnosis not present

## 2020-04-25 DIAGNOSIS — K219 Gastro-esophageal reflux disease without esophagitis: Secondary | ICD-10-CM | POA: Diagnosis not present

## 2020-04-25 DIAGNOSIS — G894 Chronic pain syndrome: Secondary | ICD-10-CM | POA: Diagnosis not present

## 2020-05-08 ENCOUNTER — Other Ambulatory Visit: Payer: Self-pay | Admitting: *Deleted

## 2020-05-08 DIAGNOSIS — C3411 Malignant neoplasm of upper lobe, right bronchus or lung: Secondary | ICD-10-CM

## 2020-05-08 DIAGNOSIS — D649 Anemia, unspecified: Secondary | ICD-10-CM | POA: Diagnosis not present

## 2020-05-09 ENCOUNTER — Other Ambulatory Visit: Payer: Self-pay | Admitting: Radiation Therapy

## 2020-05-09 DIAGNOSIS — Z20822 Contact with and (suspected) exposure to covid-19: Secondary | ICD-10-CM | POA: Diagnosis not present

## 2020-05-09 DIAGNOSIS — F329 Major depressive disorder, single episode, unspecified: Secondary | ICD-10-CM | POA: Diagnosis not present

## 2020-05-09 DIAGNOSIS — C7951 Secondary malignant neoplasm of bone: Secondary | ICD-10-CM | POA: Diagnosis not present

## 2020-05-09 DIAGNOSIS — G8220 Paraplegia, unspecified: Secondary | ICD-10-CM | POA: Diagnosis not present

## 2020-05-09 DIAGNOSIS — F419 Anxiety disorder, unspecified: Secondary | ICD-10-CM | POA: Diagnosis not present

## 2020-05-09 DIAGNOSIS — K219 Gastro-esophageal reflux disease without esophagitis: Secondary | ICD-10-CM | POA: Diagnosis not present

## 2020-05-09 DIAGNOSIS — R531 Weakness: Secondary | ICD-10-CM | POA: Diagnosis not present

## 2020-05-09 DIAGNOSIS — K5900 Constipation, unspecified: Secondary | ICD-10-CM | POA: Diagnosis not present

## 2020-05-09 DIAGNOSIS — D649 Anemia, unspecified: Secondary | ICD-10-CM | POA: Diagnosis not present

## 2020-05-09 DIAGNOSIS — G894 Chronic pain syndrome: Secondary | ICD-10-CM | POA: Diagnosis not present

## 2020-05-09 DIAGNOSIS — C3411 Malignant neoplasm of upper lobe, right bronchus or lung: Secondary | ICD-10-CM | POA: Diagnosis not present

## 2020-05-09 DIAGNOSIS — M8448XD Pathological fracture, other site, subsequent encounter for fracture with routine healing: Secondary | ICD-10-CM | POA: Diagnosis not present

## 2020-05-09 DIAGNOSIS — C7931 Secondary malignant neoplasm of brain: Secondary | ICD-10-CM | POA: Diagnosis not present

## 2020-05-09 DIAGNOSIS — Z95828 Presence of other vascular implants and grafts: Secondary | ICD-10-CM | POA: Diagnosis not present

## 2020-05-09 DIAGNOSIS — Z87891 Personal history of nicotine dependence: Secondary | ICD-10-CM | POA: Diagnosis not present

## 2020-05-09 DIAGNOSIS — L89153 Pressure ulcer of sacral region, stage 3: Secondary | ICD-10-CM | POA: Diagnosis not present

## 2020-05-17 ENCOUNTER — Encounter: Payer: Self-pay | Admitting: Radiation Therapy

## 2020-05-20 NOTE — Progress Notes (Signed)
  Radiation Oncology         [phone] ________________________________  Name: Brandon Ferguson MRN: [id number]  Date: 03/13/2020  DOB: 1961/01/20  End of Treatment Note   Diagnosis:   Bone metastasis     Indication for treatment:  palliative       Radiation treatment dates:   03/13/20  Site/dose:    1.  The T1 spine was treated with a course of SRS to a dose of 18 Gy using a 4-field technique.  2.  The T9 spine was treated with a course of SRS to a dose of 18 Gy using a 5-field technique.  Narrative: The patient tolerated radiation treatment well.   There were no signs of acute toxicity after treatment.  Plan: The patient has completed radiation treatment. The patient will return to radiation oncology clinic for routine followup in one month. I advised the patient to call or return sooner if they have any questions or concerns related to their recovery or treatment. ________________________________  ------------------------------------------------  Jodelle Gross, MD, PhD

## 2020-05-20 NOTE — Progress Notes (Signed)
  Radiation Oncology         [phone] ________________________________  Name: Brandon Ferguson MRN: [id number]  Date: 03/01/2020  DOB: 12-14-1960  End of Treatment Note  Diagnosis:   Brain metastasis     Indication for treatment:  palliative       Radiation treatment dates:   03/01/20  Site/dose:    ExacTrac, 6 vmat beams max dose=128.9% PTV1 Lt Occipital  60mm PTV2 Lt Parietal 77mm PTV3 Lt Parietal 61mm PTV4 Lt Front 61mm PTV5 Lt Parietal 91mm  Narrative: The patient tolerated radiation treatment well.   There were no signs of acute toxicity after treatment.  Plan: The patient has completed radiation treatment. The patient will return to radiation oncology clinic for routine followup in one month. I advised the patient to call or return sooner if they have any questions or concerns related to their recovery or treatment. ________________________________  ------------------------------------------------  Jodelle Gross, MD, PhD

## 2020-05-22 ENCOUNTER — Inpatient Hospital Stay: Admission: RE | Admit: 2020-05-22 | Payer: Medicaid Other | Source: Ambulatory Visit

## 2020-05-28 ENCOUNTER — Ambulatory Visit: Payer: Medicaid Other | Admitting: Internal Medicine

## 2020-06-09 DEATH — deceased

## 2022-02-18 IMAGING — MR MR HEAD WO/W CM
7 of 11 series · 20 of 48 positions shown · IV contrast (gadavist)
Comparison: MR head without and with contrast 11/12/2019
COMPARISON: MR head without and with contrast 11/12/2019

Addendum:
CLINICAL DATA: Metastatic adenocarcinoma of the lung. Assess
treatment.

EXAM:
MRI HEAD WITHOUT AND WITH CONTRAST
TECHNIQUE: Multiplanar, multiecho pulse sequences of the brain and surrounding
structures were obtained without and with intravenous contrast.
CONTRAST:  5.5mL GADAVIST GADOBUTROL 1 MMOL/ML IV SOLN

[Series 2: FLAIR · sagittal · 3.0mm · 0.47mm/px · 2 of 36 slices shown (1 of 2)]
[im 1/36]
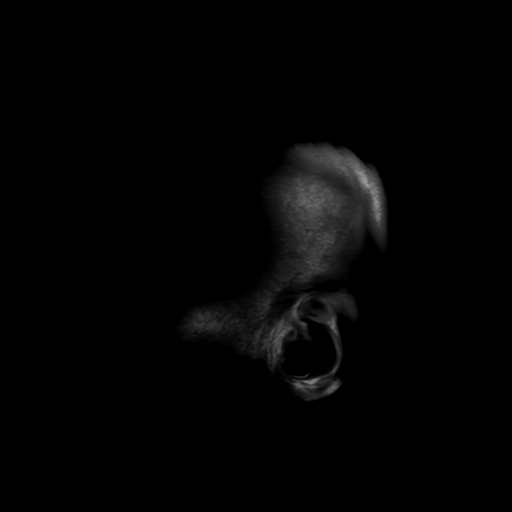
[im 36/36]
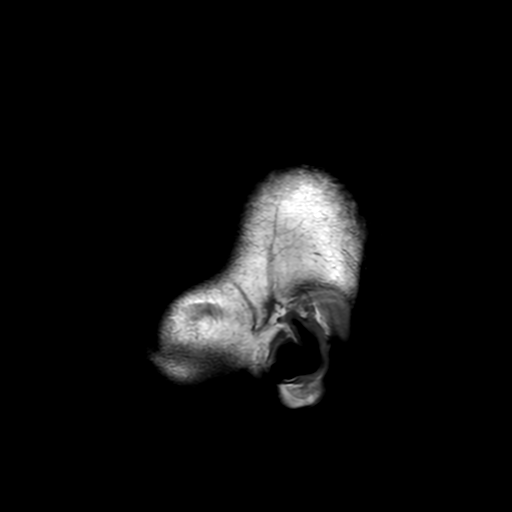

[Series 3: DWI · axial · 3.0mm · 0.94mm/px · z∈[-69,+87]mm · 6 of 106 slices shown]
[im 1/106]
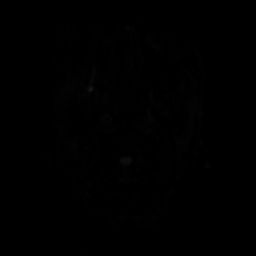
[im 22/106]
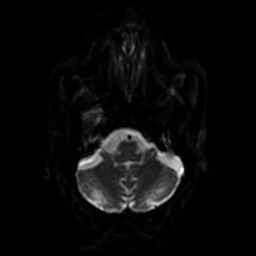
[im 43/106]
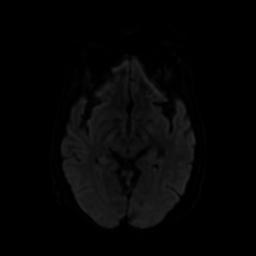
[im 64/106]
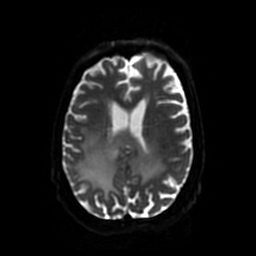
[im 85/106]
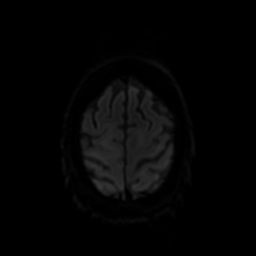
[im 106/106]
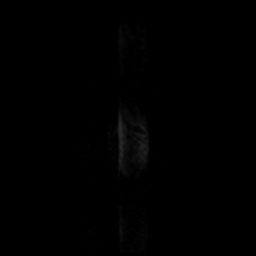

[Series 4: T2 · axial · 5.0mm · 0.23mm/px · 1 of 26 slices shown]
[im 1/26]
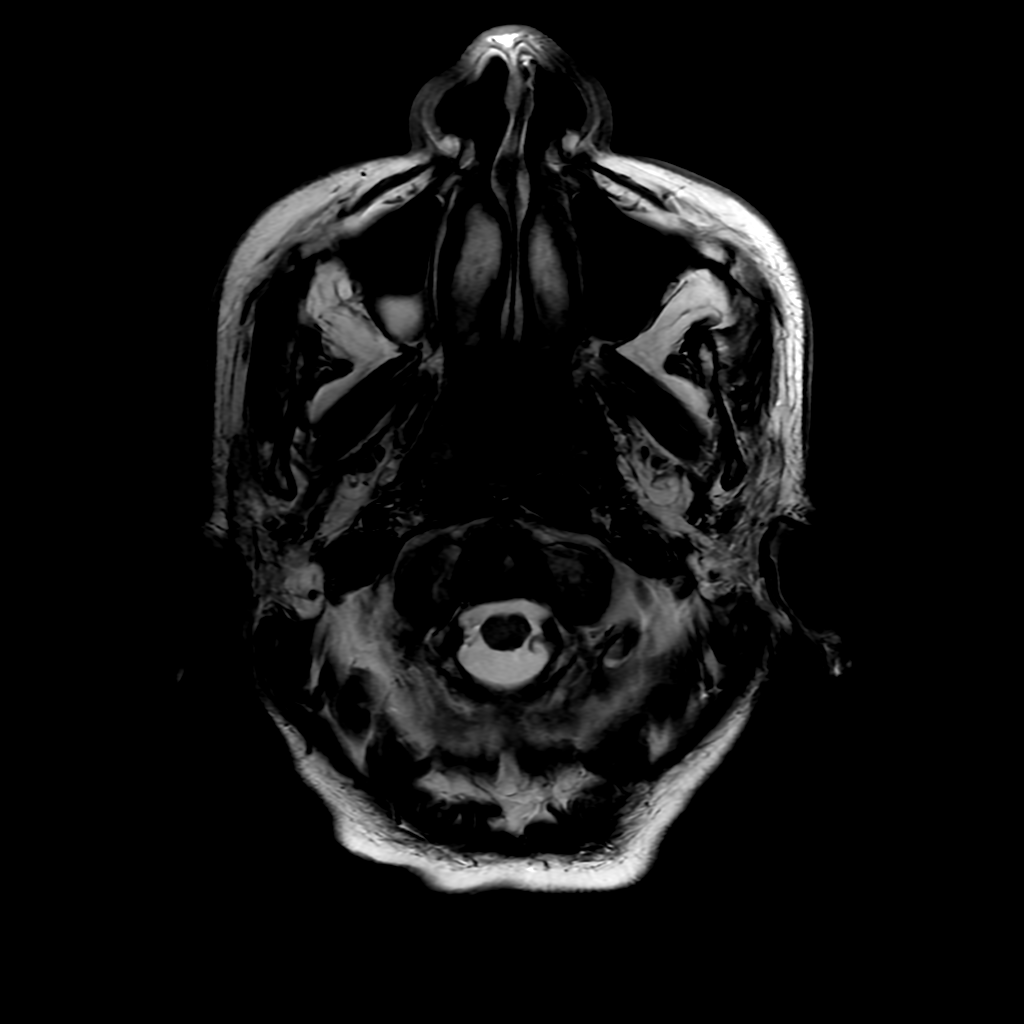

[Series 5: FLAIR · axial · 3.0mm · 0.47mm/px · z∈[-64,+101]mm · 3 of 56 slices shown (2 of 2)]
[im 1/56]
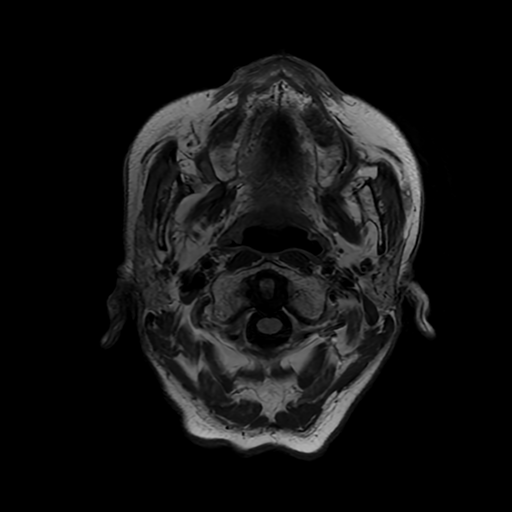
[im 28/56]
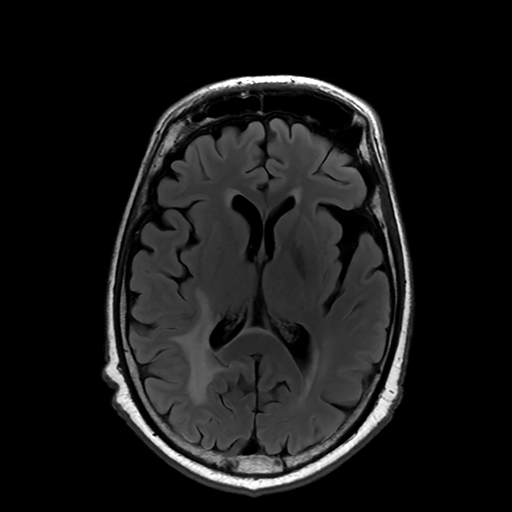
[im 56/56]
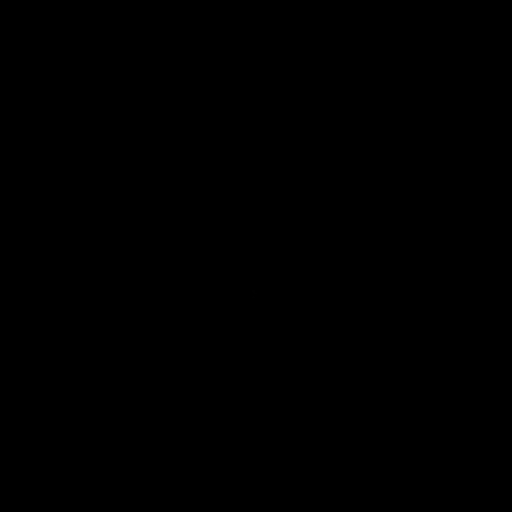

[Series 10: T1 post-contrast · coronal · 3.0mm · 0.39mm/px · 3 of 45 slices shown]
[im 1/45]
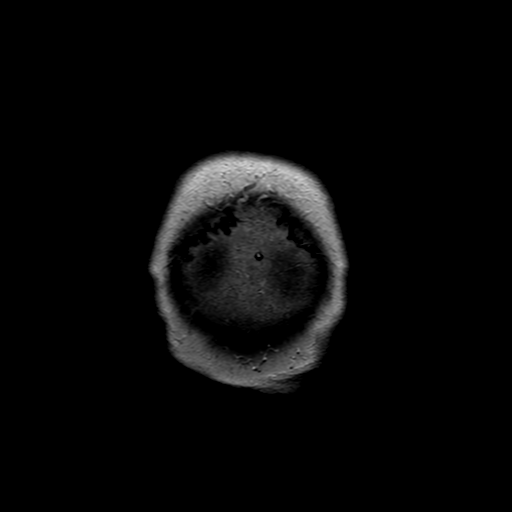
[im 23/45]
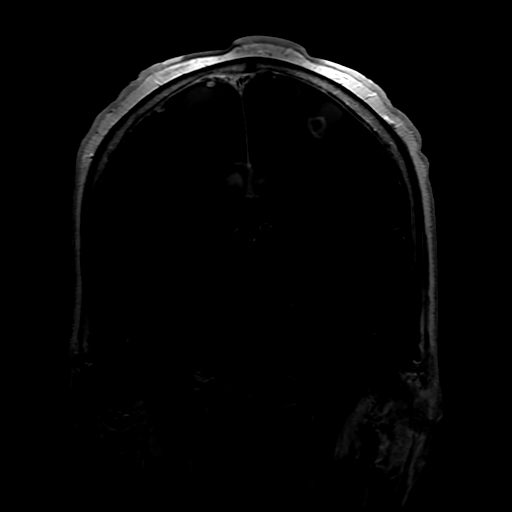
[im 45/45]
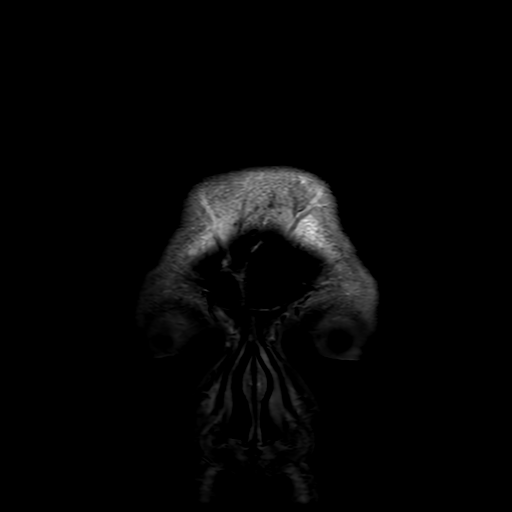

[Series 11: FLAIR post-contrast · sagittal · 3.0mm · 0.47mm/px · 2 of 36 slices shown]
[im 1/36]
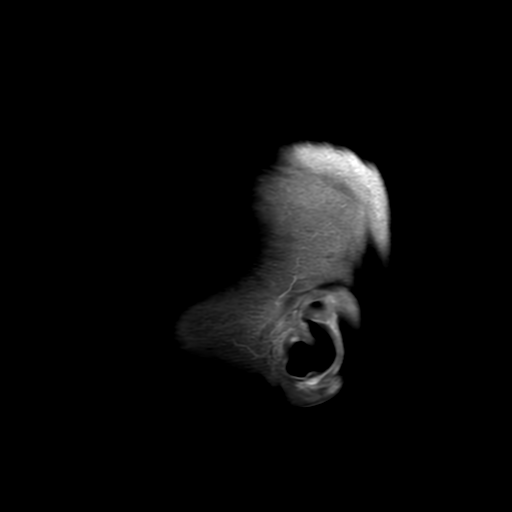
[im 36/36]
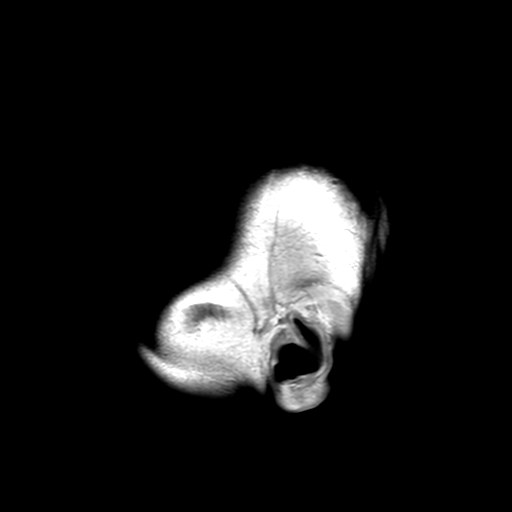

[Series 350: ADC · axial · 3.0mm · 0.94mm/px · z∈[-69,+87]mm · 3 of 53 slices shown]
[im 1/53]
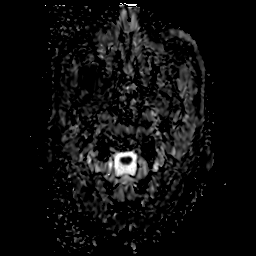
[im 27/53]
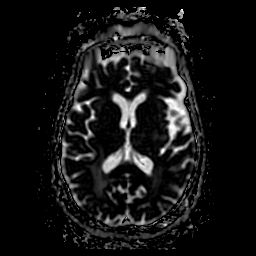
[im 53/53]
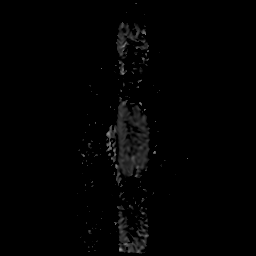

[20 of 48 positions shown; findings below may reference images not displayed]

FINDINGS: BRAIN

New Lesions: 4.

2 mm mm enhancing lesion located along the left posterior falx with
no surrounding edema and no mass effect seen on series 9, image 87.

4 mm mm enhancing lesion located in the anterior left frontal lobe
with minimal surrounding edema and no mass effect seen on series 9
image 117.

Punctate enhancing lesion located left parietal lobe with no
surrounding edema and no mass effect seen on series 9, image 123.

Punctate enhancing lesion located left parietal lobe with no
surrounding edema and no mass effect seen on series 9, image 111.

Larger lesions: 1

Treated enhancing lesion located posterior falx has increased from
13 x 24 x 17 mm to now 31 x 19 x 24 mm, with new or increased edema
and mass effect seen on series [DATE].

Increased prominence of enhancement is noted along the left third
cranial nerve extending through the foramen ovale I.

Stable or Smaller lesions: Multiple

Index peripherally enhancing lesions of the anterior left frontal
lobe on image 112 if decreased from 15 to 13 mm and in the posterior
right frontal lobe on image 135 from 65 mm.

Other Brain findings: Diffuse T2 signal change has increased,
consistent with edema or radiation change. Remote blood products are
again noted within multiple lesions, most notably in the para
falcine lesion.

Vascular: Flow is present in the major intracranial arteries.

Skull and upper cervical spine: Patchy marrow signal in the upper
cervical spine is again noted. No definite enhancing lesions are
present. Post radiation changes are present in the marrow of the
calvarium.

Sinuses/Orbits: The paranasal sinuses and mastoid air cells are
clear. The globes and orbits are within normal limits.
IMPRESSION: 1. Progression of metastatic disease as described.
2. At least 4 new lesions are present.
3. Increased prominence of enhancement along the left third cranial
nerve extending through the foramen consistent with perineural
spread of tumor.
4. Increased T2 signal change throughout the brain, consistent with
edema or radiation change.
5. Increasing size of dominant lesion in the posterior falx.
6. Stable to slight decrease in size of multiple additional lesions.

ADDENDUM:
An additional new lesion is seen in the left occipital white matter,
2 mm in diameter as measured on [DATE].

*** End of Addendum ***
FINDINGS: BRAIN

New Lesions: 4.

2 mm mm enhancing lesion located along the left posterior falx with
no surrounding edema and no mass effect seen on series 9, image 87.

4 mm mm enhancing lesion located in the anterior left frontal lobe
with minimal surrounding edema and no mass effect seen on series 9
image 117.

Punctate enhancing lesion located left parietal lobe with no
surrounding edema and no mass effect seen on series 9, image 123.

Punctate enhancing lesion located left parietal lobe with no
surrounding edema and no mass effect seen on series 9, image 111.

Larger lesions: 1

Treated enhancing lesion located posterior falx has increased from
13 x 24 x 17 mm to now 31 x 19 x 24 mm, with new or increased edema
and mass effect seen on series [DATE].

Increased prominence of enhancement is noted along the left third
cranial nerve extending through the foramen ovale I.

Stable or Smaller lesions: Multiple

Index peripherally enhancing lesions of the anterior left frontal
lobe on image 112 if decreased from 15 to 13 mm and in the posterior
right frontal lobe on image 135 from 65 mm.

Other Brain findings: Diffuse T2 signal change has increased,
consistent with edema or radiation change. Remote blood products are
again noted within multiple lesions, most notably in the para
falcine lesion.

Vascular: Flow is present in the major intracranial arteries.

Skull and upper cervical spine: Patchy marrow signal in the upper
cervical spine is again noted. No definite enhancing lesions are
present. Post radiation changes are present in the marrow of the
calvarium.

Sinuses/Orbits: The paranasal sinuses and mastoid air cells are
clear. The globes and orbits are within normal limits.
IMPRESSION: 1. Progression of metastatic disease as described.
2. At least 4 new lesions are present.
3. Increased prominence of enhancement along the left third cranial
nerve extending through the foramen consistent with perineural
spread of tumor.
4. Increased T2 signal change throughout the brain, consistent with
edema or radiation change.
5. Increasing size of dominant lesion in the posterior falx.
6. Stable to slight decrease in size of multiple additional lesions.
